# Patient Record
Sex: Female | Born: 1955 | Race: White | Hispanic: No | Smoking: Never smoker
Health system: Southern US, Community
[De-identification: ages and names within clinical notes are randomized; demographics above are authoritative.]

## PROBLEM LIST (undated history)

## (undated) DIAGNOSIS — S43439A Superior glenoid labrum lesion of unspecified shoulder, initial encounter: Secondary | ICD-10-CM

## (undated) DIAGNOSIS — E7800 Pure hypercholesterolemia, unspecified: Secondary | ICD-10-CM

## (undated) DIAGNOSIS — I10 Essential (primary) hypertension: Secondary | ICD-10-CM

## (undated) DIAGNOSIS — G4730 Sleep apnea, unspecified: Secondary | ICD-10-CM

## (undated) DIAGNOSIS — M5020 Other cervical disc displacement, unspecified cervical region: Secondary | ICD-10-CM

## (undated) DIAGNOSIS — G8929 Other chronic pain: Secondary | ICD-10-CM

## (undated) DIAGNOSIS — Z8719 Personal history of other diseases of the digestive system: Secondary | ICD-10-CM

## (undated) DIAGNOSIS — K219 Gastro-esophageal reflux disease without esophagitis: Secondary | ICD-10-CM

## (undated) DIAGNOSIS — J189 Pneumonia, unspecified organism: Secondary | ICD-10-CM

## (undated) DIAGNOSIS — Z87440 Personal history of urinary (tract) infections: Secondary | ICD-10-CM

## (undated) DIAGNOSIS — K449 Diaphragmatic hernia without obstruction or gangrene: Secondary | ICD-10-CM

## (undated) DIAGNOSIS — M1990 Unspecified osteoarthritis, unspecified site: Secondary | ICD-10-CM

## (undated) DIAGNOSIS — I2699 Other pulmonary embolism without acute cor pulmonale: Secondary | ICD-10-CM

## (undated) DIAGNOSIS — G709 Myoneural disorder, unspecified: Secondary | ICD-10-CM

## (undated) DIAGNOSIS — F32A Depression, unspecified: Secondary | ICD-10-CM

## (undated) DIAGNOSIS — H269 Unspecified cataract: Secondary | ICD-10-CM

## (undated) DIAGNOSIS — J449 Chronic obstructive pulmonary disease, unspecified: Secondary | ICD-10-CM

## (undated) DIAGNOSIS — T7840XA Allergy, unspecified, initial encounter: Secondary | ICD-10-CM

## (undated) DIAGNOSIS — K5792 Diverticulitis of intestine, part unspecified, without perforation or abscess without bleeding: Secondary | ICD-10-CM

## (undated) DIAGNOSIS — I219 Acute myocardial infarction, unspecified: Secondary | ICD-10-CM

## (undated) DIAGNOSIS — D689 Coagulation defect, unspecified: Secondary | ICD-10-CM

## (undated) DIAGNOSIS — R011 Cardiac murmur, unspecified: Secondary | ICD-10-CM

## (undated) DIAGNOSIS — D649 Anemia, unspecified: Secondary | ICD-10-CM

## (undated) DIAGNOSIS — K635 Polyp of colon: Secondary | ICD-10-CM

## (undated) DIAGNOSIS — F329 Major depressive disorder, single episode, unspecified: Secondary | ICD-10-CM

## (undated) HISTORY — PX: BACK SURGERY: SHX140

## (undated) HISTORY — DX: Coagulation defect, unspecified: D68.9

## (undated) HISTORY — PX: TUBAL LIGATION: SHX77

## (undated) HISTORY — DX: Personal history of other diseases of the digestive system: Z87.19

## (undated) HISTORY — PX: JOINT REPLACEMENT: SHX530

## (undated) HISTORY — PX: CARPAL TUNNEL RELEASE: SHX101

## (undated) HISTORY — DX: Unspecified cataract: H26.9

## (undated) HISTORY — DX: Allergy, unspecified, initial encounter: T78.40XA

## (undated) HISTORY — DX: Unspecified osteoarthritis, unspecified site: M19.90

## (undated) HISTORY — PX: ABDOMINAL HYSTERECTOMY: SHX81

## (undated) HISTORY — DX: Anemia, unspecified: D64.9

## (undated) HISTORY — DX: Major depressive disorder, single episode, unspecified: F32.9

## (undated) HISTORY — DX: Chronic obstructive pulmonary disease, unspecified: J44.9

## (undated) HISTORY — DX: Myoneural disorder, unspecified: G70.9

## (undated) HISTORY — PX: OTHER SURGICAL HISTORY: SHX169

## (undated) HISTORY — DX: Other chronic pain: G89.29

## (undated) HISTORY — PX: CHOLECYSTECTOMY: SHX55

## (undated) HISTORY — DX: Depression, unspecified: F32.A

## (undated) HISTORY — PX: REPLACEMENT TOTAL KNEE: SUR1224

## (undated) HISTORY — PX: BREAST BIOPSY: SHX20

## (undated) HISTORY — DX: Polyp of colon: K63.5

## (undated) HISTORY — DX: Superior glenoid labrum lesion of unspecified shoulder, initial encounter: S43.439A

---

## 1998-01-26 ENCOUNTER — Encounter: Admission: RE | Admit: 1998-01-26 | Discharge: 1998-01-26 | Payer: Self-pay | Admitting: Family Medicine

## 1998-02-19 ENCOUNTER — Encounter: Admission: RE | Admit: 1998-02-19 | Discharge: 1998-02-19 | Payer: Self-pay | Admitting: Sports Medicine

## 1998-07-15 ENCOUNTER — Encounter: Admission: RE | Admit: 1998-07-15 | Discharge: 1998-07-15 | Payer: Self-pay | Admitting: Family Medicine

## 1998-07-21 ENCOUNTER — Encounter: Admission: RE | Admit: 1998-07-21 | Discharge: 1998-07-21 | Payer: Self-pay | Admitting: Family Medicine

## 1998-08-06 ENCOUNTER — Encounter: Admission: RE | Admit: 1998-08-06 | Discharge: 1998-08-06 | Payer: Self-pay | Admitting: Family Medicine

## 1998-08-21 ENCOUNTER — Encounter: Admission: RE | Admit: 1998-08-21 | Discharge: 1998-08-21 | Payer: Self-pay | Admitting: Family Medicine

## 1998-09-22 ENCOUNTER — Encounter: Admission: RE | Admit: 1998-09-22 | Discharge: 1998-09-22 | Payer: Self-pay | Admitting: Family Medicine

## 1999-02-09 ENCOUNTER — Encounter: Admission: RE | Admit: 1999-02-09 | Discharge: 1999-02-09 | Payer: Self-pay | Admitting: Sports Medicine

## 1999-02-09 ENCOUNTER — Ambulatory Visit (HOSPITAL_COMMUNITY): Admission: RE | Admit: 1999-02-09 | Discharge: 1999-02-09 | Payer: Self-pay | Admitting: Family Medicine

## 1999-02-09 ENCOUNTER — Ambulatory Visit (HOSPITAL_COMMUNITY): Admission: RE | Admit: 1999-02-09 | Discharge: 1999-02-09 | Payer: Self-pay | Admitting: Sports Medicine

## 1999-02-24 ENCOUNTER — Ambulatory Visit (HOSPITAL_COMMUNITY): Admission: RE | Admit: 1999-02-24 | Discharge: 1999-02-24 | Payer: Self-pay | Admitting: *Deleted

## 1999-03-01 ENCOUNTER — Encounter: Admission: RE | Admit: 1999-03-01 | Discharge: 1999-03-01 | Payer: Self-pay | Admitting: Family Medicine

## 1999-07-27 ENCOUNTER — Encounter: Admission: RE | Admit: 1999-07-27 | Discharge: 1999-07-27 | Payer: Self-pay | Admitting: Sports Medicine

## 1999-09-14 ENCOUNTER — Other Ambulatory Visit: Admission: RE | Admit: 1999-09-14 | Discharge: 1999-09-14 | Payer: Self-pay | Admitting: *Deleted

## 1999-10-22 ENCOUNTER — Encounter: Admission: RE | Admit: 1999-10-22 | Discharge: 1999-10-22 | Payer: Self-pay | Admitting: Family Medicine

## 2000-03-29 ENCOUNTER — Encounter: Admission: RE | Admit: 2000-03-29 | Discharge: 2000-03-29 | Payer: Self-pay | Admitting: *Deleted

## 2000-03-29 ENCOUNTER — Encounter: Payer: Self-pay | Admitting: *Deleted

## 2000-03-29 ENCOUNTER — Encounter: Admission: RE | Admit: 2000-03-29 | Discharge: 2000-03-29 | Payer: Self-pay | Admitting: Family Medicine

## 2000-08-11 ENCOUNTER — Ambulatory Visit (HOSPITAL_COMMUNITY): Admission: RE | Admit: 2000-08-11 | Discharge: 2000-08-11 | Payer: Self-pay | Admitting: *Deleted

## 2000-08-11 ENCOUNTER — Encounter: Admission: RE | Admit: 2000-08-11 | Discharge: 2000-08-11 | Payer: Self-pay | Admitting: Family Medicine

## 2000-09-08 ENCOUNTER — Encounter: Admission: RE | Admit: 2000-09-08 | Discharge: 2000-09-08 | Payer: Self-pay | Admitting: Family Medicine

## 2000-09-08 ENCOUNTER — Ambulatory Visit (HOSPITAL_COMMUNITY): Admission: RE | Admit: 2000-09-08 | Discharge: 2000-09-08 | Payer: Self-pay | Admitting: *Deleted

## 2000-09-23 ENCOUNTER — Encounter (INDEPENDENT_AMBULATORY_CARE_PROVIDER_SITE_OTHER): Payer: Self-pay | Admitting: *Deleted

## 2000-10-19 ENCOUNTER — Encounter: Admission: RE | Admit: 2000-10-19 | Discharge: 2000-10-19 | Payer: Self-pay | Admitting: *Deleted

## 2000-10-30 ENCOUNTER — Encounter: Admission: RE | Admit: 2000-10-30 | Discharge: 2000-10-30 | Payer: Self-pay | Admitting: *Deleted

## 2001-02-12 ENCOUNTER — Encounter: Admission: RE | Admit: 2001-02-12 | Discharge: 2001-02-12 | Payer: Self-pay | Admitting: Family Medicine

## 2001-05-17 ENCOUNTER — Encounter: Admission: RE | Admit: 2001-05-17 | Discharge: 2001-05-17 | Payer: Self-pay | Admitting: Family Medicine

## 2001-07-09 ENCOUNTER — Ambulatory Visit (HOSPITAL_COMMUNITY): Admission: RE | Admit: 2001-07-09 | Discharge: 2001-07-09 | Payer: Self-pay | Admitting: Gastroenterology

## 2001-07-09 ENCOUNTER — Encounter (INDEPENDENT_AMBULATORY_CARE_PROVIDER_SITE_OTHER): Payer: Self-pay | Admitting: *Deleted

## 2001-07-16 ENCOUNTER — Encounter: Admission: RE | Admit: 2001-07-16 | Discharge: 2001-07-16 | Payer: Self-pay | Admitting: Family Medicine

## 2001-09-07 ENCOUNTER — Encounter: Admission: RE | Admit: 2001-09-07 | Discharge: 2001-09-07 | Payer: Self-pay | Admitting: Sports Medicine

## 2001-09-07 ENCOUNTER — Encounter: Payer: Self-pay | Admitting: Sports Medicine

## 2001-09-07 ENCOUNTER — Encounter: Admission: RE | Admit: 2001-09-07 | Discharge: 2001-09-07 | Payer: Self-pay | Admitting: Family Medicine

## 2001-10-03 ENCOUNTER — Encounter: Admission: RE | Admit: 2001-10-03 | Discharge: 2001-10-03 | Payer: Self-pay | Admitting: Family Medicine

## 2001-10-08 ENCOUNTER — Encounter: Admission: RE | Admit: 2001-10-08 | Discharge: 2001-10-08 | Payer: Self-pay | Admitting: Family Medicine

## 2001-10-30 ENCOUNTER — Encounter: Admission: RE | Admit: 2001-10-30 | Discharge: 2001-10-30 | Payer: Self-pay | Admitting: Family Medicine

## 2002-03-05 ENCOUNTER — Encounter: Admission: RE | Admit: 2002-03-05 | Discharge: 2002-03-05 | Payer: Self-pay | Admitting: Sports Medicine

## 2002-04-09 ENCOUNTER — Inpatient Hospital Stay (HOSPITAL_COMMUNITY): Admission: EM | Admit: 2002-04-09 | Discharge: 2002-04-10 | Payer: Self-pay

## 2002-09-25 ENCOUNTER — Encounter: Admission: RE | Admit: 2002-09-25 | Discharge: 2002-09-25 | Payer: Self-pay | Admitting: Family Medicine

## 2002-11-12 ENCOUNTER — Ambulatory Visit (HOSPITAL_COMMUNITY): Admission: RE | Admit: 2002-11-12 | Discharge: 2002-11-12 | Payer: Self-pay | Admitting: Orthopedic Surgery

## 2002-11-21 ENCOUNTER — Ambulatory Visit (HOSPITAL_COMMUNITY): Admission: RE | Admit: 2002-11-21 | Discharge: 2002-11-21 | Payer: Self-pay | Admitting: Endocrinology

## 2002-11-21 ENCOUNTER — Encounter: Payer: Self-pay | Admitting: Endocrinology

## 2002-11-22 ENCOUNTER — Encounter: Admission: RE | Admit: 2002-11-22 | Discharge: 2003-02-20 | Payer: Self-pay

## 2004-12-17 ENCOUNTER — Emergency Department (HOSPITAL_COMMUNITY): Admission: EM | Admit: 2004-12-17 | Discharge: 2004-12-17 | Payer: Self-pay | Admitting: Emergency Medicine

## 2005-04-01 ENCOUNTER — Ambulatory Visit: Payer: Self-pay | Admitting: Internal Medicine

## 2005-04-07 ENCOUNTER — Ambulatory Visit: Payer: Self-pay

## 2005-04-08 ENCOUNTER — Ambulatory Visit: Payer: Self-pay

## 2005-04-14 ENCOUNTER — Ambulatory Visit (HOSPITAL_COMMUNITY): Admission: RE | Admit: 2005-04-14 | Discharge: 2005-04-14 | Payer: Self-pay | Admitting: Gastroenterology

## 2005-04-14 ENCOUNTER — Encounter (INDEPENDENT_AMBULATORY_CARE_PROVIDER_SITE_OTHER): Payer: Self-pay | Admitting: Specialist

## 2005-06-03 ENCOUNTER — Encounter: Admission: RE | Admit: 2005-06-03 | Discharge: 2005-06-03 | Payer: Self-pay | Admitting: Gastroenterology

## 2005-06-06 ENCOUNTER — Encounter: Admission: RE | Admit: 2005-06-06 | Discharge: 2005-06-06 | Payer: Self-pay | Admitting: Gastroenterology

## 2005-06-08 ENCOUNTER — Encounter: Admission: RE | Admit: 2005-06-08 | Discharge: 2005-06-08 | Payer: Self-pay | Admitting: Gastroenterology

## 2005-06-23 ENCOUNTER — Ambulatory Visit (HOSPITAL_COMMUNITY): Admission: RE | Admit: 2005-06-23 | Discharge: 2005-06-23 | Payer: Self-pay | Admitting: Urology

## 2005-06-28 ENCOUNTER — Ambulatory Visit (HOSPITAL_COMMUNITY): Admission: RE | Admit: 2005-06-28 | Discharge: 2005-06-29 | Payer: Self-pay | Admitting: Urology

## 2005-08-31 ENCOUNTER — Encounter: Admission: RE | Admit: 2005-08-31 | Discharge: 2005-08-31 | Payer: Self-pay | Admitting: Family Medicine

## 2005-09-19 ENCOUNTER — Encounter: Admission: RE | Admit: 2005-09-19 | Discharge: 2005-09-19 | Payer: Self-pay | Admitting: Family Medicine

## 2005-09-19 ENCOUNTER — Encounter (INDEPENDENT_AMBULATORY_CARE_PROVIDER_SITE_OTHER): Payer: Self-pay | Admitting: *Deleted

## 2005-10-10 ENCOUNTER — Ambulatory Visit (HOSPITAL_COMMUNITY): Admission: RE | Admit: 2005-10-10 | Discharge: 2005-10-10 | Payer: Self-pay | Admitting: Cardiology

## 2006-09-06 ENCOUNTER — Encounter: Admission: RE | Admit: 2006-09-06 | Discharge: 2006-09-06 | Payer: Self-pay | Admitting: Family Medicine

## 2006-09-15 ENCOUNTER — Encounter: Admission: RE | Admit: 2006-09-15 | Discharge: 2006-09-15 | Payer: Self-pay | Admitting: Family Medicine

## 2006-12-22 ENCOUNTER — Encounter (INDEPENDENT_AMBULATORY_CARE_PROVIDER_SITE_OTHER): Payer: Self-pay | Admitting: *Deleted

## 2007-06-15 ENCOUNTER — Emergency Department (HOSPITAL_COMMUNITY): Admission: EM | Admit: 2007-06-15 | Discharge: 2007-06-16 | Payer: Self-pay | Admitting: Emergency Medicine

## 2007-11-02 ENCOUNTER — Encounter: Admission: RE | Admit: 2007-11-02 | Discharge: 2007-11-02 | Payer: Self-pay | Admitting: Family Medicine

## 2008-07-11 ENCOUNTER — Emergency Department (HOSPITAL_COMMUNITY): Admission: EM | Admit: 2008-07-11 | Discharge: 2008-07-12 | Payer: Self-pay | Admitting: Emergency Medicine

## 2008-09-01 ENCOUNTER — Encounter (INDEPENDENT_AMBULATORY_CARE_PROVIDER_SITE_OTHER): Payer: Self-pay | Admitting: Obstetrics and Gynecology

## 2008-09-01 ENCOUNTER — Ambulatory Visit (HOSPITAL_COMMUNITY): Admission: RE | Admit: 2008-09-01 | Discharge: 2008-09-02 | Payer: Self-pay | Admitting: Obstetrics and Gynecology

## 2008-09-17 ENCOUNTER — Emergency Department (HOSPITAL_COMMUNITY): Admission: EM | Admit: 2008-09-17 | Discharge: 2008-09-17 | Payer: Self-pay | Admitting: Emergency Medicine

## 2008-12-05 ENCOUNTER — Encounter: Admission: RE | Admit: 2008-12-05 | Discharge: 2008-12-05 | Payer: Self-pay | Admitting: Sports Medicine

## 2008-12-29 ENCOUNTER — Ambulatory Visit (HOSPITAL_COMMUNITY): Admission: RE | Admit: 2008-12-29 | Discharge: 2008-12-29 | Payer: Self-pay | Admitting: Family Medicine

## 2009-01-02 ENCOUNTER — Inpatient Hospital Stay (HOSPITAL_COMMUNITY): Admission: EM | Admit: 2009-01-02 | Discharge: 2009-01-05 | Payer: Self-pay | Admitting: Emergency Medicine

## 2009-01-05 ENCOUNTER — Encounter (INDEPENDENT_AMBULATORY_CARE_PROVIDER_SITE_OTHER): Payer: Self-pay | Admitting: Internal Medicine

## 2009-01-07 ENCOUNTER — Emergency Department (HOSPITAL_COMMUNITY): Admission: EM | Admit: 2009-01-07 | Discharge: 2009-01-07 | Payer: Self-pay | Admitting: Emergency Medicine

## 2009-04-06 ENCOUNTER — Emergency Department (HOSPITAL_COMMUNITY): Admission: EM | Admit: 2009-04-06 | Discharge: 2009-04-07 | Payer: Self-pay | Admitting: Emergency Medicine

## 2009-05-08 ENCOUNTER — Encounter
Admission: RE | Admit: 2009-05-08 | Discharge: 2009-08-06 | Payer: Self-pay | Admitting: Physical Medicine & Rehabilitation

## 2009-05-12 ENCOUNTER — Ambulatory Visit: Payer: Self-pay | Admitting: Physical Medicine & Rehabilitation

## 2009-05-21 ENCOUNTER — Ambulatory Visit (HOSPITAL_COMMUNITY)
Admission: RE | Admit: 2009-05-21 | Discharge: 2009-05-21 | Payer: Self-pay | Admitting: Physical Medicine & Rehabilitation

## 2009-06-09 ENCOUNTER — Ambulatory Visit: Payer: Self-pay | Admitting: Physical Medicine & Rehabilitation

## 2009-06-25 ENCOUNTER — Encounter
Admission: RE | Admit: 2009-06-25 | Discharge: 2009-08-24 | Payer: Self-pay | Admitting: Physical Medicine & Rehabilitation

## 2009-07-09 ENCOUNTER — Ambulatory Visit: Payer: Self-pay | Admitting: Physical Medicine & Rehabilitation

## 2009-08-05 ENCOUNTER — Encounter
Admission: RE | Admit: 2009-08-05 | Discharge: 2009-10-14 | Payer: Self-pay | Admitting: Physical Medicine & Rehabilitation

## 2009-08-07 ENCOUNTER — Ambulatory Visit: Payer: Self-pay | Admitting: Physical Medicine & Rehabilitation

## 2009-08-14 ENCOUNTER — Ambulatory Visit: Payer: Self-pay | Admitting: Physical Medicine & Rehabilitation

## 2009-08-21 ENCOUNTER — Ambulatory Visit: Payer: Self-pay | Admitting: Physical Medicine & Rehabilitation

## 2009-10-01 ENCOUNTER — Encounter: Admission: RE | Admit: 2009-10-01 | Discharge: 2009-10-01 | Payer: Self-pay | Admitting: Internal Medicine

## 2009-10-28 ENCOUNTER — Encounter
Admission: RE | Admit: 2009-10-28 | Discharge: 2010-01-26 | Payer: Self-pay | Admitting: Physical Medicine & Rehabilitation

## 2009-10-29 ENCOUNTER — Ambulatory Visit: Payer: Self-pay | Admitting: Physical Medicine & Rehabilitation

## 2009-11-05 ENCOUNTER — Inpatient Hospital Stay (HOSPITAL_COMMUNITY): Admission: EM | Admit: 2009-11-05 | Discharge: 2009-11-08 | Payer: Self-pay | Admitting: Emergency Medicine

## 2010-12-10 ENCOUNTER — Other Ambulatory Visit: Payer: Self-pay | Admitting: Internal Medicine

## 2010-12-10 ENCOUNTER — Ambulatory Visit
Admission: RE | Admit: 2010-12-10 | Discharge: 2010-12-10 | Disposition: A | Discharge status: Home / Self Care | Payer: Medicare Other | Source: Ambulatory Visit | Attending: Internal Medicine | Admitting: Internal Medicine

## 2010-12-10 DIAGNOSIS — R1032 Left lower quadrant pain: Secondary | ICD-10-CM

## 2010-12-10 MED ORDER — IOHEXOL 300 MG/ML  SOLN
100.0000 mL | Freq: Once | INTRAMUSCULAR | Status: AC | PRN
  Administered 2010-12-10: 125 mL via INTRAVENOUS

## 2010-12-23 ENCOUNTER — Inpatient Hospital Stay (HOSPITAL_COMMUNITY)
Admission: EM | Admit: 2010-12-23 | Discharge: 2011-01-01 | DRG: 392 | Disposition: A | Discharge status: Home / Self Care | Payer: Medicare Other | Attending: Internal Medicine | Admitting: Internal Medicine

## 2010-12-23 ENCOUNTER — Emergency Department (HOSPITAL_COMMUNITY): Payer: Medicare Other

## 2010-12-23 DIAGNOSIS — K5732 Diverticulitis of large intestine without perforation or abscess without bleeding: Principal | ICD-10-CM | POA: Diagnosis present

## 2010-12-23 DIAGNOSIS — K7689 Other specified diseases of liver: Secondary | ICD-10-CM | POA: Diagnosis present

## 2010-12-23 DIAGNOSIS — K5909 Other constipation: Secondary | ICD-10-CM | POA: Diagnosis present

## 2010-12-23 DIAGNOSIS — J45909 Unspecified asthma, uncomplicated: Secondary | ICD-10-CM | POA: Diagnosis present

## 2010-12-23 DIAGNOSIS — K219 Gastro-esophageal reflux disease without esophagitis: Secondary | ICD-10-CM | POA: Diagnosis present

## 2010-12-23 DIAGNOSIS — E669 Obesity, unspecified: Secondary | ICD-10-CM | POA: Diagnosis present

## 2010-12-23 DIAGNOSIS — B952 Enterococcus as the cause of diseases classified elsewhere: Secondary | ICD-10-CM | POA: Diagnosis present

## 2010-12-23 DIAGNOSIS — Z860100 Personal history of colon polyps, unspecified: Secondary | ICD-10-CM

## 2010-12-23 DIAGNOSIS — N390 Urinary tract infection, site not specified: Secondary | ICD-10-CM | POA: Diagnosis present

## 2010-12-23 DIAGNOSIS — R109 Unspecified abdominal pain: Secondary | ICD-10-CM | POA: Diagnosis present

## 2010-12-23 DIAGNOSIS — Z86718 Personal history of other venous thrombosis and embolism: Secondary | ICD-10-CM

## 2010-12-23 DIAGNOSIS — Z7982 Long term (current) use of aspirin: Secondary | ICD-10-CM

## 2010-12-23 DIAGNOSIS — G894 Chronic pain syndrome: Secondary | ICD-10-CM | POA: Diagnosis present

## 2010-12-23 DIAGNOSIS — F1120 Opioid dependence, uncomplicated: Secondary | ICD-10-CM | POA: Diagnosis present

## 2010-12-23 DIAGNOSIS — Z86711 Personal history of pulmonary embolism: Secondary | ICD-10-CM

## 2010-12-23 DIAGNOSIS — E785 Hyperlipidemia, unspecified: Secondary | ICD-10-CM | POA: Diagnosis present

## 2010-12-23 DIAGNOSIS — Z7901 Long term (current) use of anticoagulants: Secondary | ICD-10-CM

## 2010-12-23 LAB — URINE MICROSCOPIC-ADD ON

## 2010-12-23 LAB — COMPREHENSIVE METABOLIC PANEL
Albumin: 3.6 g/dL (ref 3.5–5.2)
BUN: 8 mg/dL (ref 6–23)
Calcium: 9.2 mg/dL (ref 8.4–10.5)
Creatinine, Ser: 0.67 mg/dL (ref 0.4–1.2)
Total Bilirubin: 1.1 mg/dL (ref 0.3–1.2)
Total Protein: 6.6 g/dL (ref 6.0–8.3)

## 2010-12-23 LAB — PROTIME-INR
INR: 1 (ref 0.00–1.49)
Prothrombin Time: 13.4 seconds (ref 11.6–15.2)

## 2010-12-23 LAB — CBC
MCHC: 31.8 g/dL (ref 30.0–36.0)
Platelets: 269 10*3/uL (ref 150–400)
RDW: 17.6 % — ABNORMAL HIGH (ref 11.5–15.5)

## 2010-12-23 LAB — URINALYSIS, ROUTINE W REFLEX MICROSCOPIC
Protein, ur: NEGATIVE mg/dL
Urine Glucose, Fasting: NEGATIVE mg/dL
pH: 6 (ref 5.0–8.0)

## 2010-12-23 LAB — DIFFERENTIAL
Monocytes Absolute: 0.6 10*3/uL (ref 0.1–1.0)
Neutro Abs: 5.5 10*3/uL (ref 1.7–7.7)

## 2010-12-23 MED ORDER — IOHEXOL 300 MG/ML  SOLN
100.0000 mL | Freq: Once | INTRAMUSCULAR | Status: AC | PRN
  Administered 2010-12-23: 100 mL via INTRAVENOUS

## 2010-12-25 ENCOUNTER — Inpatient Hospital Stay (HOSPITAL_COMMUNITY): Payer: Medicare Other

## 2010-12-25 LAB — URINALYSIS, ROUTINE W REFLEX MICROSCOPIC
Ketones, ur: NEGATIVE mg/dL
Nitrite: NEGATIVE
Protein, ur: NEGATIVE mg/dL

## 2010-12-25 LAB — COMPREHENSIVE METABOLIC PANEL
ALT: 12 U/L (ref 0–35)
Alkaline Phosphatase: 51 U/L (ref 39–117)
CO2: 29 mEq/L (ref 19–32)
GFR calc non Af Amer: >60 mL/min (ref >60)
Glucose, Bld: 106 mg/dL — ABNORMAL HIGH (ref 70–99)
Potassium: 3.6 mEq/L (ref 3.5–5.1)
Sodium: 146 mEq/L — ABNORMAL HIGH (ref 135–145)
Total Bilirubin: 0.5 mg/dL (ref 0.3–1.2)

## 2010-12-25 LAB — CBC
HCT: 37.5 % (ref 36.0–46.0)
Hemoglobin: 11.2 g/dL — ABNORMAL LOW (ref 12.0–15.0)
MCHC: 29.9 g/dL — ABNORMAL LOW (ref 30.0–36.0)

## 2010-12-25 LAB — PROTIME-INR: INR: 1.54 — ABNORMAL HIGH (ref 0.00–1.49)

## 2010-12-26 LAB — URINE CULTURE: Culture: NO GROWTH

## 2010-12-27 LAB — URINALYSIS, ROUTINE W REFLEX MICROSCOPIC
Bilirubin Urine: NEGATIVE
Nitrite: NEGATIVE
Protein, ur: NEGATIVE mg/dL
Specific Gravity, Urine: 1.012 (ref 1.005–1.030)
Urobilinogen, UA: 0.2 mg/dL (ref 0.0–1.0)

## 2010-12-27 LAB — BASIC METABOLIC PANEL
BUN: 2 mg/dL — ABNORMAL LOW (ref 6–23)
Calcium: 8.8 mg/dL (ref 8.4–10.5)
GFR calc non Af Amer: >60 mL/min (ref >60)
Potassium: 3 mEq/L — ABNORMAL LOW (ref 3.5–5.1)
Sodium: 143 mEq/L (ref 135–145)

## 2010-12-27 LAB — CBC
Hemoglobin: 11.8 g/dL — ABNORMAL LOW (ref 12.0–15.0)
MCHC: 30.6 g/dL (ref 30.0–36.0)
Platelets: 222 10*3/uL (ref 150–400)
RBC: 4.63 MIL/uL (ref 3.87–5.11)

## 2010-12-27 LAB — PROTIME-INR
INR: 3.55 — ABNORMAL HIGH (ref 0.00–1.49)
Prothrombin Time: 35.5 seconds — ABNORMAL HIGH (ref 11.6–15.2)

## 2010-12-27 LAB — MAGNESIUM: Magnesium: 2.1 mg/dL (ref 1.5–2.5)

## 2010-12-28 LAB — CBC
MCH: 24.9 pg — ABNORMAL LOW (ref 26.0–34.0)
MCHC: 30.4 g/dL (ref 30.0–36.0)
MCV: 82 fL (ref 78.0–100.0)
Platelets: 194 10*3/uL (ref 150–400)
RBC: 4.38 MIL/uL (ref 3.87–5.11)
RDW: 18.1 % — ABNORMAL HIGH (ref 11.5–15.5)

## 2010-12-28 LAB — PROTIME-INR: Prothrombin Time: 36.6 seconds — ABNORMAL HIGH (ref 11.6–15.2)

## 2010-12-28 LAB — COMPREHENSIVE METABOLIC PANEL
BUN: 2 mg/dL — ABNORMAL LOW (ref 6–23)
CO2: 25 mEq/L (ref 19–32)
Calcium: 8.4 mg/dL (ref 8.4–10.5)
Creatinine, Ser: 0.62 mg/dL (ref 0.4–1.2)
GFR calc non Af Amer: >60 mL/min (ref >60)
Glucose, Bld: 90 mg/dL (ref 70–99)
Total Protein: 5.5 g/dL — ABNORMAL LOW (ref 6.0–8.3)

## 2010-12-29 LAB — CBC
HCT: 37.1 % (ref 36.0–46.0)
MCH: 24.9 pg — ABNORMAL LOW (ref 26.0–34.0)
MCV: 81 fL (ref 78.0–100.0)
Platelets: 214 10*3/uL (ref 150–400)
RDW: 17.8 % — ABNORMAL HIGH (ref 11.5–15.5)
WBC: 8.2 10*3/uL (ref 4.0–10.5)

## 2010-12-29 LAB — COMPREHENSIVE METABOLIC PANEL
AST: 36 U/L (ref 0–37)
Alkaline Phosphatase: 46 U/L (ref 39–117)
BUN: <1 mg/dL — ABNORMAL LOW (ref 6–23)
CO2: 28 mEq/L (ref 19–32)
Chloride: 108 mEq/L (ref 96–112)
Creatinine, Ser: 0.66 mg/dL (ref 0.4–1.2)
GFR calc Af Amer: >60 mL/min (ref >60)
GFR calc non Af Amer: >60 mL/min (ref >60)
Potassium: 3.5 mEq/L (ref 3.5–5.1)
Total Bilirubin: 0.5 mg/dL (ref 0.3–1.2)

## 2010-12-29 LAB — HEPATITIS PANEL, ACUTE: HCV Ab: NEGATIVE

## 2010-12-30 ENCOUNTER — Inpatient Hospital Stay (HOSPITAL_COMMUNITY): Payer: Medicare Other

## 2010-12-30 LAB — URINALYSIS, ROUTINE W REFLEX MICROSCOPIC
Glucose, UA: NEGATIVE mg/dL
Ketones, ur: NEGATIVE mg/dL
Nitrite: NEGATIVE
Specific Gravity, Urine: 1.014 (ref 1.005–1.030)
pH: 7 (ref 5.0–8.0)

## 2010-12-30 LAB — URINE CULTURE
Colony Count: 60000
Special Requests: NEGATIVE

## 2010-12-30 LAB — PROTIME-INR: INR: 2.16 — ABNORMAL HIGH (ref 0.00–1.49)

## 2010-12-31 ENCOUNTER — Encounter (HOSPITAL_COMMUNITY): Payer: Self-pay | Admitting: Radiology

## 2010-12-31 LAB — BASIC METABOLIC PANEL
Chloride: 106 mEq/L (ref 96–112)
GFR calc Af Amer: >60 mL/min (ref >60)
GFR calc non Af Amer: >60 mL/min (ref >60)
Potassium: 2.8 mEq/L — ABNORMAL LOW (ref 3.5–5.1)
Sodium: 137 mEq/L (ref 135–145)

## 2010-12-31 LAB — PROTIME-INR
INR: 2.75 — ABNORMAL HIGH (ref 0.00–1.49)
Prothrombin Time: 29.2 seconds — ABNORMAL HIGH (ref 11.6–15.2)

## 2010-12-31 MED ORDER — IOHEXOL 300 MG/ML  SOLN
100.0000 mL | Freq: Once | INTRAMUSCULAR | Status: AC | PRN
  Administered 2010-12-31: 100 mL via INTRAVENOUS
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
## 2011-01-01 LAB — URINE CULTURE
Culture: NO GROWTH
Special Requests: NEGATIVE

## 2011-01-01 LAB — BASIC METABOLIC PANEL
BUN: 2 mg/dL — ABNORMAL LOW (ref 6–23)
Calcium: 8.4 mg/dL (ref 8.4–10.5)
Chloride: 110 mEq/L (ref 96–112)
Creatinine, Ser: 0.57 mg/dL (ref 0.4–1.2)
GFR calc Af Amer: >60 mL/min (ref >60)

## 2011-01-01 LAB — PROTIME-INR
INR: 2.86 — ABNORMAL HIGH (ref 0.00–1.49)
Prothrombin Time: 30.1 seconds — ABNORMAL HIGH (ref 11.6–15.2)

## 2011-01-09 LAB — URINALYSIS, ROUTINE W REFLEX MICROSCOPIC
Bilirubin Urine: NEGATIVE
Hgb urine dipstick: NEGATIVE
Ketones, ur: NEGATIVE mg/dL
Nitrite: NEGATIVE
Nitrite: POSITIVE — AB
Protein, ur: NEGATIVE mg/dL
Specific Gravity, Urine: 1.013 (ref 1.005–1.030)
Urobilinogen, UA: 0.2 mg/dL (ref 0.0–1.0)
Urobilinogen, UA: 1 mg/dL (ref 0.0–1.0)
pH: 8.5 — ABNORMAL HIGH (ref 5.0–8.0)

## 2011-01-09 LAB — CARDIAC PANEL(CRET KIN+CKTOT+MB+TROPI)
CK, MB: 2.4 ng/mL (ref 0.3–4.0)
Total CK: 151 U/L (ref 7–177)
Total CK: 90 U/L (ref 7–177)
Troponin I: 0.02 ng/mL (ref 0.00–0.06)
Troponin I: 0.06 ng/mL (ref 0.00–0.06)

## 2011-01-09 LAB — PROTIME-INR
INR: 1.87 — ABNORMAL HIGH (ref 0.00–1.49)
Prothrombin Time: 24.6 seconds — ABNORMAL HIGH (ref 11.6–15.2)

## 2011-01-09 LAB — BASIC METABOLIC PANEL
BUN: 8 mg/dL (ref 6–23)
CO2: 31 mEq/L (ref 19–32)
Calcium: 8.6 mg/dL (ref 8.4–10.5)
Calcium: 9.6 mg/dL (ref 8.4–10.5)
Creatinine, Ser: 0.71 mg/dL (ref 0.4–1.2)
GFR calc Af Amer: >60 mL/min (ref >60)
GFR calc non Af Amer: 56 mL/min — ABNORMAL LOW (ref >60)
GFR calc non Af Amer: >60 mL/min (ref >60)
Glucose, Bld: 102 mg/dL — ABNORMAL HIGH (ref 70–99)
Glucose, Bld: 102 mg/dL — ABNORMAL HIGH (ref 70–99)
Sodium: 136 mEq/L (ref 135–145)
Sodium: 137 mEq/L (ref 135–145)

## 2011-01-09 LAB — DIFFERENTIAL
Basophils Relative: 0 % (ref 0–1)
Eosinophils Absolute: 0 10*3/uL (ref 0.0–0.7)
Lymphs Abs: 1.6 10*3/uL (ref 0.7–4.0)
Monocytes Absolute: 1.3 10*3/uL — ABNORMAL HIGH (ref 0.1–1.0)
Monocytes Relative: 9 % (ref 3–12)
Neutrophils Relative %: 80 % — ABNORMAL HIGH (ref 43–77)

## 2011-01-09 LAB — CBC
Hemoglobin: 11.6 g/dL — ABNORMAL LOW (ref 12.0–15.0)
MCHC: 33.6 g/dL (ref 30.0–36.0)
MCHC: 33.7 g/dL (ref 30.0–36.0)
Platelets: 248 10*3/uL (ref 150–400)
Platelets: 272 10*3/uL (ref 150–400)
RBC: 4.19 MIL/uL (ref 3.87–5.11)
RDW: 16.1 % — ABNORMAL HIGH (ref 11.5–15.5)
RDW: 16.4 % — ABNORMAL HIGH (ref 11.5–15.5)
RDW: 16.5 % — ABNORMAL HIGH (ref 11.5–15.5)
WBC: 16.4 10*3/uL — ABNORMAL HIGH (ref 4.0–10.5)

## 2011-01-09 LAB — CULTURE, BLOOD (ROUTINE X 2)
Culture: NO GROWTH
Culture: NO GROWTH

## 2011-01-09 LAB — URINE MICROSCOPIC-ADD ON

## 2011-01-09 LAB — URINE CULTURE: Colony Count: >=100000

## 2011-01-09 LAB — TSH: TSH: 3.467 u[IU]/mL (ref 0.350–4.500)

## 2011-01-09 LAB — COMPREHENSIVE METABOLIC PANEL
ALT: 15 U/L (ref 0–35)
AST: 20 U/L (ref 0–37)
Albumin: 3.2 g/dL — ABNORMAL LOW (ref 3.5–5.2)
Alkaline Phosphatase: 63 U/L (ref 39–117)
Calcium: 8.7 mg/dL (ref 8.4–10.5)
GFR calc Af Amer: >60 mL/min (ref >60)
Potassium: 3.2 mEq/L — ABNORMAL LOW (ref 3.5–5.1)
Sodium: 134 mEq/L — ABNORMAL LOW (ref 135–145)
Total Protein: 6.9 g/dL (ref 6.0–8.3)

## 2011-01-09 LAB — POCT CARDIAC MARKERS
CKMB, poc: <1 ng/mL — ABNORMAL LOW (ref 1.0–8.0)
Troponin i, poc: <0.05 ng/mL (ref 0.00–0.09)

## 2011-01-09 LAB — T4, FREE: Free T4: 1.2 ng/dL (ref 0.80–1.80)
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
## 2011-01-31 LAB — BASIC METABOLIC PANEL
CO2: 27 mEq/L (ref 19–32)
Chloride: 103 mEq/L (ref 96–112)
GFR calc Af Amer: >60 mL/min (ref >60)
Glucose, Bld: 98 mg/dL (ref 70–99)
Sodium: 139 mEq/L (ref 135–145)

## 2011-01-31 LAB — URINALYSIS, ROUTINE W REFLEX MICROSCOPIC
Bilirubin Urine: NEGATIVE
Hgb urine dipstick: NEGATIVE
Ketones, ur: 15 mg/dL — AB
Specific Gravity, Urine: 1.036 — ABNORMAL HIGH (ref 1.005–1.030)
Urobilinogen, UA: 0.2 mg/dL (ref 0.0–1.0)

## 2011-01-31 LAB — DIFFERENTIAL
Basophils Relative: 1 % (ref 0–1)
Eosinophils Absolute: 0.2 10*3/uL (ref 0.0–0.7)
Eosinophils Relative: 2 % (ref 0–5)
Monocytes Absolute: 0.7 10*3/uL (ref 0.1–1.0)
Monocytes Relative: 6 % (ref 3–12)
Neutro Abs: 8.3 10*3/uL — ABNORMAL HIGH (ref 1.7–7.7)

## 2011-01-31 LAB — CBC
HCT: 41.3 % (ref 36.0–46.0)
Hemoglobin: 13.6 g/dL (ref 12.0–15.0)
MCHC: 32.9 g/dL (ref 30.0–36.0)
MCV: 78.2 fL (ref 78.0–100.0)
RBC: 5.29 MIL/uL — ABNORMAL HIGH (ref 3.87–5.11)

## 2011-01-31 LAB — URINE MICROSCOPIC-ADD ON

## 2011-02-03 LAB — HOMOCYSTEINE: Homocysteine: 9.8 umol/L (ref 4.0–15.4)

## 2011-02-03 LAB — CBC
HCT: 37.1 % (ref 36.0–46.0)
HCT: 34.7 % — ABNORMAL LOW (ref 36.0–46.0)
HCT: 36.9 % (ref 36.0–46.0)
HCT: 38.6 % (ref 36.0–46.0)
Hemoglobin: 11.5 g/dL — ABNORMAL LOW (ref 12.0–15.0)
Hemoglobin: 12.6 g/dL (ref 12.0–15.0)
Hemoglobin: 12.6 g/dL (ref 12.0–15.0)
Hemoglobin: 12.6 g/dL (ref 12.0–15.0)
MCHC: 32.6 g/dL (ref 30.0–36.0)
MCHC: 32.8 g/dL (ref 30.0–36.0)
MCHC: 33.2 g/dL (ref 30.0–36.0)
MCHC: 34.1 g/dL (ref 30.0–36.0)
MCV: 79.7 fL (ref 78.0–100.0)
MCV: 79.7 fL (ref 78.0–100.0)
MCV: 80.6 fL (ref 78.0–100.0)
MCV: 81.1 fL (ref 78.0–100.0)
MCV: 81.1 fL (ref 78.0–100.0)
Platelets: 325 10*3/uL (ref 150–400)
Platelets: 340 10*3/uL (ref 150–400)
Platelets: 359 10*3/uL (ref 150–400)
RBC: 4.66 MIL/uL (ref 3.87–5.11)
RBC: 4.3 MIL/uL (ref 3.87–5.11)
RBC: 4.63 MIL/uL (ref 3.87–5.11)
RBC: 4.76 MIL/uL (ref 3.87–5.11)
RBC: 4.78 MIL/uL (ref 3.87–5.11)
RDW: 14.4 % (ref 11.5–15.5)
RDW: 15.2 % (ref 11.5–15.5)
RDW: 15.7 % — ABNORMAL HIGH (ref 11.5–15.5)
WBC: 10.4 10*3/uL (ref 4.0–10.5)
WBC: 10.5 10*3/uL (ref 4.0–10.5)
WBC: 8.3 10*3/uL (ref 4.0–10.5)
WBC: 9 10*3/uL (ref 4.0–10.5)

## 2011-02-03 LAB — DIFFERENTIAL
Basophils Absolute: 0 10*3/uL (ref 0.0–0.1)
Basophils Relative: 1 % (ref 0–1)
Eosinophils Absolute: 0.2 10*3/uL (ref 0.0–0.7)
Lymphocytes Relative: 20 % (ref 12–46)
Lymphs Abs: 2.4 10*3/uL (ref 0.7–4.0)
Neutro Abs: 7.4 10*3/uL (ref 1.7–7.7)
Neutrophils Relative %: 67 % (ref 43–77)

## 2011-02-03 LAB — LUPUS ANTICOAGULANT PANEL
DRVVT: 54.7 secs — ABNORMAL HIGH (ref 36.1–47.0)
Lupus Anticoagulant: DETECTED — AB
PTT Lupus Anticoagulant: 78.8 secs — ABNORMAL HIGH (ref 36.3–48.8)
PTTLA 4:1 Mix: 64.7 secs — ABNORMAL HIGH (ref 36.3–48.8)
PTTLA Confirmation: 15.1 secs — ABNORMAL HIGH (ref <8.0)
dRVVT Incubated 1:1 Mix: 39.7 secs (ref 36.1–47.0)

## 2011-02-03 LAB — COMPREHENSIVE METABOLIC PANEL
ALT: 14 U/L (ref 0–35)
BUN: 11 mg/dL (ref 6–23)
CO2: 26 mEq/L (ref 19–32)
CO2: 29 mEq/L (ref 19–32)
Calcium: 9.2 mg/dL (ref 8.4–10.5)
Chloride: 107 mEq/L (ref 96–112)
Creatinine, Ser: 0.89 mg/dL (ref 0.4–1.2)
GFR calc non Af Amer: >60 mL/min (ref >60)
GFR calc non Af Amer: >60 mL/min (ref >60)
Glucose, Bld: 90 mg/dL (ref 70–99)
Sodium: 138 mEq/L (ref 135–145)
Total Bilirubin: 0.5 mg/dL (ref 0.3–1.2)

## 2011-02-03 LAB — URINALYSIS, ROUTINE W REFLEX MICROSCOPIC
Hgb urine dipstick: NEGATIVE
Nitrite: NEGATIVE
Nitrite: NEGATIVE
Protein, ur: NEGATIVE mg/dL
Specific Gravity, Urine: >1.046 — ABNORMAL HIGH (ref 1.005–1.030)
Urobilinogen, UA: 1 mg/dL (ref 0.0–1.0)
Urobilinogen, UA: 1 mg/dL (ref 0.0–1.0)

## 2011-02-03 LAB — APTT
aPTT: 44 seconds — ABNORMAL HIGH (ref 24–37)
aPTT: 31 seconds (ref 24–37)

## 2011-02-03 LAB — FOLATE: Folate: 14 ng/mL

## 2011-02-03 LAB — POCT CARDIAC MARKERS
CKMB, poc: <1 ng/mL — ABNORMAL LOW (ref 1.0–8.0)
Myoglobin, poc: 71.2 ng/mL (ref 12–200)
Troponin i, poc: <0.05 ng/mL (ref 0.00–0.09)
Troponin i, poc: <0.05 ng/mL (ref 0.00–0.09)

## 2011-02-03 LAB — PROTIME-INR
INR: 1.9 — ABNORMAL HIGH (ref 0.00–1.49)
INR: 1 (ref 0.00–1.49)
INR: 1.1 (ref 0.00–1.49)
INR: 2 — ABNORMAL HIGH (ref 0.00–1.49)
Prothrombin Time: 13.7 seconds (ref 11.6–15.2)
Prothrombin Time: 14.4 seconds (ref 11.6–15.2)
Prothrombin Time: 23.9 seconds — ABNORMAL HIGH (ref 11.6–15.2)

## 2011-02-03 LAB — URINE MICROSCOPIC-ADD ON

## 2011-02-03 LAB — POCT I-STAT, CHEM 8
BUN: 13 mg/dL (ref 6–23)
Creatinine, Ser: 0.8 mg/dL (ref 0.4–1.2)
Potassium: 3.8 mEq/L (ref 3.5–5.1)
Sodium: 141 mEq/L (ref 135–145)

## 2011-02-03 LAB — PROTEIN S ACTIVITY: Protein S Activity: 102 % (ref 69–129)

## 2011-02-03 LAB — BASIC METABOLIC PANEL
BUN: 6 mg/dL (ref 6–23)
BUN: 7 mg/dL (ref 6–23)
CO2: 26 mEq/L (ref 19–32)
CO2: 29 mEq/L (ref 19–32)
Calcium: 8.8 mg/dL (ref 8.4–10.5)
Calcium: 9.1 mg/dL (ref 8.4–10.5)
Chloride: 101 mEq/L (ref 96–112)
Chloride: 101 mEq/L (ref 96–112)
Creatinine, Ser: 0.77 mg/dL (ref 0.4–1.2)
Creatinine, Ser: 0.81 mg/dL (ref 0.4–1.2)
GFR calc Af Amer: >60 mL/min (ref >60)
GFR calc Af Amer: >60 mL/min (ref >60)
GFR calc non Af Amer: >60 mL/min (ref >60)
GFR calc non Af Amer: >60 mL/min (ref >60)
Glucose, Bld: 112 mg/dL — ABNORMAL HIGH (ref 70–99)
Glucose, Bld: 138 mg/dL — ABNORMAL HIGH (ref 70–99)
Potassium: 3.6 mEq/L (ref 3.5–5.1)
Potassium: 3.7 mEq/L (ref 3.5–5.1)
Sodium: 136 mEq/L (ref 135–145)
Sodium: 138 mEq/L (ref 135–145)

## 2011-02-03 LAB — RETICULOCYTES
RBC.: 4.76 MIL/uL (ref 3.87–5.11)
Retic Count, Absolute: 42.8 10*3/uL (ref 19.0–186.0)
Retic Ct Pct: 0.9 % (ref 0.4–3.1)

## 2011-02-03 LAB — CARDIOLIPIN ANTIBODIES, IGG, IGM, IGA
Anticardiolipin IgA: 10 [APL'U] — ABNORMAL LOW (ref <13)
Anticardiolipin IgG: <7 [GPL'U] — ABNORMAL LOW (ref <11)
Anticardiolipin IgM: <7 [MPL'U] — ABNORMAL LOW (ref <10)

## 2011-02-03 LAB — HEPARIN LEVEL (UNFRACTIONATED): Heparin Unfractionated: 0.16 IU/mL — ABNORMAL LOW (ref 0.30–0.70)

## 2011-02-03 LAB — LIPASE, BLOOD
Lipase: 17 U/L (ref 11–59)
Lipase: 12 U/L (ref 11–59)

## 2011-02-03 LAB — PROTEIN S, TOTAL: Protein S Ag, Total: 159 % — ABNORMAL HIGH (ref 70–140)

## 2011-02-03 LAB — VITAMIN B12: Vitamin B-12: 325 pg/mL (ref 211–911)

## 2011-02-03 LAB — URINE CULTURE
Colony Count: 60000
Colony Count: 85000

## 2011-02-03 LAB — PROTEIN C, TOTAL: Protein C, Total: 142 % — ABNORMAL HIGH (ref 70–140)

## 2011-02-03 LAB — IRON AND TIBC
Iron: 48 ug/dL (ref 42–135)
Saturation Ratios: 14 % — ABNORMAL LOW (ref 20–55)
TIBC: 332 ug/dL (ref 250–470)
UIBC: 284 ug/dL

## 2011-02-03 LAB — D-DIMER, QUANTITATIVE: D-Dimer, Quant: 1.83 ug/mL-FEU — ABNORMAL HIGH (ref 0.00–0.48)

## 2011-02-03 LAB — ANTITHROMBIN III: AntiThromb III Func: 124 % — ABNORMAL HIGH (ref 76–126)

## 2011-02-03 LAB — FERRITIN: Ferritin: 67 ng/mL (ref 10–291)

## 2011-02-03 LAB — PROTEIN C ACTIVITY: Protein C Activity: 200 % — ABNORMAL HIGH (ref 75–133)

## 2011-03-01 ENCOUNTER — Ambulatory Visit
Admission: RE | Admit: 2011-03-01 | Discharge: 2011-03-01 | Disposition: A | Discharge status: Home / Self Care | Payer: Medicare Other | Source: Ambulatory Visit | Attending: Internal Medicine | Admitting: Internal Medicine

## 2011-03-01 ENCOUNTER — Other Ambulatory Visit: Payer: Self-pay | Admitting: Internal Medicine

## 2011-03-01 DIAGNOSIS — M79604 Pain in right leg: Secondary | ICD-10-CM

## 2011-03-01 DIAGNOSIS — M79605 Pain in left leg: Secondary | ICD-10-CM

## 2011-03-02 ENCOUNTER — Other Ambulatory Visit: Payer: Self-pay | Admitting: Pain Medicine

## 2011-03-02 ENCOUNTER — Ambulatory Visit
Admission: RE | Admit: 2011-03-02 | Discharge: 2011-03-02 | Disposition: A | Discharge status: Home / Self Care | Payer: Medicare Other | Source: Ambulatory Visit | Attending: Pain Medicine | Admitting: Pain Medicine

## 2011-03-02 DIAGNOSIS — M25561 Pain in right knee: Secondary | ICD-10-CM

## 2011-03-05 ENCOUNTER — Other Ambulatory Visit: Payer: Medicare Other

## 2011-03-08 NOTE — Procedures (Signed)
NAME:  [name], [name] NO.:  [id number]   MEDICAL RECORD NO.:  [id number]           PATIENT TYPE:   LOCATION:                                 FACILITY:   PHYSICIAN:  Erick Colace, M.D.DATE OF BIRTH:  03/02/1956   DATE OF PROCEDURE:  DATE OF DISCHARGE:                               OPERATIVE REPORT   Procedure is a right knee injection.   INDICATIONS:  Right knee osteoarthritis.  Pain is only partially  responsive to medication management and other conservative care.   She has had some relief from knee injections in the past but more on the  left side than right side.   Informed consent was obtained after describing risks and benefits of the  procedure with the patient.  These include bleeding, bruising,  infection.  She elects to proceed and has given written consent.  The  patient placed in a supine position, medial approach utilized.  Area  marked and prepped with Betadine and alcohol, entered with 25-gauge inch  and a half needle.  A 1 mL of 40 mg/mL Depo-Medrol and 4 mL of 1%  lidocaine injected after negative drawback for blood.  The patient  tolerated the procedure well.  Postinjection instructions given.      Erick Colace, M.D.  Electronically Signed     AEK/MEDQ  D:  06/09/2009 17:26:53  T:  06/10/2009 03:25:02  Job:  [id number]
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
## 2011-03-08 NOTE — Discharge Summary (Signed)
NAME:  [name], [name]            ACCOUNT NO.:  [id number]   MEDICAL RECORD NO.:  [id number]          PATIENT TYPE:  OIB   LOCATION:  9318                          FACILITY:  [facility]   PHYSICIAN:  Sherron Monday, MD        DATE OF BIRTH:  [birth year]   DATE OF ADMISSION:  09/01/2008  DATE OF DISCHARGE:  09/02/2008                               DISCHARGE SUMMARY   PROCEDURE:  Total vaginal hysterectomy with anterior and posterior  repair.   HISTORY OF PRESENT ILLNESS:  A 55 year old, G3, P1-2-0-3, who had  complaints of pelvic relaxation as well as urinary incontinence prior to  surgery.  She was evaluated by [facility] who felt that she would  not benefit from urologic procedure, but did agree with the plan for  total vaginal hysterectomy and A and P repair.  This was discussed with  the patient including risks, benefits, alternatives, and she wished to  proceed.   Medical history is significant for asthma, low back pain,  hypercholesterolemia, DVTs, and a PE after delivery.   Past surgical history is significant for tubal ligation, spinal fusion,  cholecystectomy, bladder tack and sling, bilateral bone spur removal,  and bilateral carpal tunnel release.   ALLERGIES:  No known drug allergies.   MEDICATIONS:  Vicodin, clorazepate, Daypro, multivitamins, omeprazole,  Vytorin, cyclobenzaprine, Colace, baby aspirin, lidocaine ointment,  albuterol, and Lidoderm patch.   SOCIAL HISTORY:  She denies tobacco or drug use, and she is an  occasional alcohol user.  She is married and a homemaker on disability.   PAST OB/GYN HISTORY:  No history of abnormal Pap smears or any sexually  transmitted diseases.  A G3, P1-2-0-3, with three vaginal deliveries,  the third of which she had a pulmonary embolism following the delivery.   Family history is significant for diabetes, coronary artery disease,  hypertension, colon cancer, skin cancer, breast cancer.   Her physical exam on admission was  benign.  Her hospital course was  relatively uncomplicated.  She underwent a total vaginal hysterectomy  and A and P repair on September 01, 2008, without complications.  Her EBL  was approximately 200 mL.  Her postoperative course was relatively  uncomplicated.  She remained afebrile.  Vital signs stable throughout.  On postoperative day #1, she was ambulating, voiding without difficulty.  She did complain of a headache that morning.  She states she did have a  history of migraines and was given Midrin to help with this.  Her  hemoglobin decreased from 13.7.  Her creatinine was stable at 0.6.  She  had a benign exam and was discharged home with routine discharge  instructions and numbers to call with any questions or problems as well  as prescriptions  for Percocet, ibuprofen, and Lovenox.  She was instructed to use the  Lovenox for 27 more doses given the recommendations for chronic  prophylaxis.  She voiced understanding if this and will follow up in the  office in approximately 2-3 weeks.      Sherron Monday, MD  Electronically Signed     JB/MEDQ  D:  09/02/2008  T:  09/02/2008  Job:  [id number]
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
## 2011-03-08 NOTE — Op Note (Signed)
NAME:  [name], [name]            ACCOUNT NO.:  [id number]   MEDICAL RECORD NO.:  [id number]          PATIENT TYPE:  OIB   LOCATION:                                FACILITY:  [facility]   PHYSICIAN:  Sherron Monday, MD        DATE OF BIRTH:  [birth year]   DATE OF PROCEDURE:  09/01/2008  DATE OF DISCHARGE:  09/02/2008                               OPERATIVE REPORT   PREOPERATIVE DIAGNOSIS:  Pelvic relaxation.   POSTOPERATIVE DIAGNOSIS:  Pelvic relaxation.   PROCEDURE:  Total vaginal hysterectomy, anterior and posterior  colporrhaphy, uterosacral ligament plication.   SURGEON:  Sherron Monday, MD   ASSISTANT:  Malachi Pro. Ambrose Mantle, MD   ANESTHESIA:  General endotracheal.   COMPLICATIONS:  None.   ESTIMATED BLOOD LOSS:  200 mL.   URINE OUTPUT:  200 mL clear urine at the end of procedure.   FLUIDS:  1900 mL.   FINDINGS:  Normal size uterus with 2 small fibroids.  Normal-appearing  tubes and ovaries.   PROCEDURE:  After informed consent was reviewed with the patient  including risks, benefits, and alternatives of the procedure, she was  transported to the operating room, placed on the table in supine  position and then she was placed in the [place] stirrups in lithotomy  position to verify her comfort.  General anesthesia was induced and  found to be adequate.  She was then prepped and draped in the normal  sterile fashion.  Foley was steriley placed.  Using a heavy weigthed  speculum and simms retractor, her cervix was easily visualized.  Vasopressin was used to infiltrate the cervical mucosa.  The uterus was  circumferentially incised with Bovie cautery.  Attempt was made to get  in anteriorly was unsuccessful.  Posteriorly, the posterior cul-de-sac  was entered sharply without difficulty.  Heaney clamps were placed over  the uterosacral ligaments bilaterally, transected, and doubly ligated  with 0 Vicryl.  Hemostasis was assured.  The cardinal ligaments were  clamped on both sides,  transected, and suture ligated in a similar  fashion.  The anterior cul-de-sac was then entered sharply.  The uterine  arteries and broad ligament were serially clamped with Heaney clamps,  transected, and suture ligated.  Excellent hemostasis was visualized.  Both the cornua were clamped with Heaney clamps, transected, and the  uterus was delivered.  The pedicles were doubly ligated with excellent  hemostasis.  The uterosacral ligaments were plicated using 0 Vicryl.  The sutures that had been used to hold the uterosacral ligaments were  then tied together and hemostasis was assured.  The anterior repair was  then performed and the mucosa was incised, and the mucosa was then  dissected to the fascia.  The fascia was made hemostatic and brought  together in the midline for better bladder support using 2-0 Vicryl.  The vaginal mucosa was trimmed, and the mucosa was closed with 3-0  Vicryl including closing the vaginal cuff.  Attention was then turned to  the posterior repair at the level of the hymenal ring.  The mucosa was  incised and separated up to  the level of the cuff using blunt and sharp  dissection.  The fascia was then identified and brought together in the  midline using 2-0 Vicryl sutures.  This was closed with 3-0 Vicryl.  Hemostasis was assured with Bovie cautery.  Foley catheter was then  placed, and the vagina was packed with Estrace-coated packing, and  sponge, lap, and needle counts were correct x2.  The patient was  awakened in stable condition and transported to the PACU following the  procedure.      Sherron Monday, MD  Electronically Signed     JB/MEDQ  D:  09/02/2008  T:  09/02/2008  Job:  [id number]

## 2011-03-08 NOTE — H&P (Signed)
NAME:  [name], [name]            ACCOUNT NO.:  [id number]   MEDICAL RECORD NO.:  [id number]          PATIENT TYPE:  AMB   LOCATION:  SDC                           FACILITY:  [facility]   PHYSICIAN:  Sherron Monday, MD        DATE OF BIRTH:  Feb 16, 1956   DATE OF ADMISSION:  DATE OF DISCHARGE:                              HISTORY & PHYSICAL   PROCEDURE PLANNED:  Total vaginal hysterectomy with anterior and  posterior repair.   HISTORY OF PRESENT ILLNESS:  A 55 year old G3, P 1-2-0-3 who presents  initially with complaints of pelvic relaxation.  She states she has had  pain and pressure in her pelvis that she states is worse on the day.  She has had no problems with irregular vaginal bleeding.  She has  complained of some issues with incontinence of both stress and urge  type, as well as occasional splinting.  She has been evaluated by Dr.  Annabell Howells with [facility], who states that she is a Valsalva voider  and would not benefit from a splint.  We will proceed with our surgery,  TVH, A & P repair.   PAST MEDICAL HISTORY:  Significant for asthma, low back pain,  hypercholesterolemia, DVT below the knee, as well as the PE after a  delivery.   PAST SURGICAL HISTORY:  Significant for bilateral tubal ligation, spinal  fusion in 1989 and 1991, cholecystectomy in 1996, bladder tack and sling  in 1996, and bilateral bone spur removal, and bilateral carpal tunnel  release.   ALLERGIES:  No known drug allergies.   MEDICATIONS:  Vicodin, clorazepate, Daypro, multivitamins, omeprazole,  Vytorin, cyclobenzaprine, Colace, baby aspirin, lidocaine ointment,  albuterol, and Lidoderm patch.   SOCIAL HISTORY:  She lives with a smoker, but denies alcohol or tobacco  use.  She is occasional alcohol user and no drug use.  She is married  and homemaker on disability.   PAST OB/GYN HISTORY:  She has no history of any abnormal Pap smear last  was in January 2009, no history of any sexually transmitted  diseases.  She is currently not sexually active.  Menarche at age 54, menopause in  2007, and has had no post menopausal bleeding.  She denies discharge,  itch, or odor.  She is a G3, P 1-2-0-3 with a term delivery in 1976,  female infant weighing 8 pounds, 35-week vaginal delivery in 25 of a  female infant weighing 7 pounds and 6 ounces, 34-week vaginal delivery in  1984 for a female infant weighing 7 pounds and 4 ounces.  Following this  pregnancy, she had a pulmonary embolism.   FAMILY HISTORY:  Significant for diabetes in maternal grandmother,  maternal aunt, maternal uncle, maternal cousin, coronary artery disease  in mother, father, brother, maternal grandmother, and paternal  grandmother; hypertension in father; paternal grandmother with colon  cancer; father with skin cancer, maternal aunt with breast cancer,  maternal uncle with colon cancer.   Her cholesterol is followed by her PCP.  Her recent mammogram was in  January 2009.  She is up to date with her colonoscopy which revealed  polyp.  She had a bone density scan in 2008.   PHYSICAL EXAMINATION:  GENERAL:  She is afebrile with vital signs stable  and hemoglobin of 12.5.  No apparent distress.  CARDIOVASCULAR:  Regular rate and rhythm.  LUNGS:  Clear to auscultation bilaterally.  HEENT:  Mucous membranes are moist.  NECK:  Without lymphadenopathy.  Normal thyroid.  BACK:  No costovertebral angle tenderness.  BREASTS:  No mass, nontender, and no distortion.  ABDOMEN:  Soft, nontender, no distortion, positive bowel sounds.  EXTREMITIES:  Symmetric and nontender.  PELVIC:  Normal external female genitalia, normal Bartholin's, urethra,  and Skene glands.  She has atrophic with 3+ cystocele and 2+ rectocele.  No cervix and vagina without lesions.  No cervical motion tenderness.  Normal uterus.  Adnexa no masses and nontender.  Exam is limited by her  habitus.   ASSESSMENT AND PLAN:  A 55 year old female G3, P1-2-0-3 for a  total  vaginal hysterectomy, possible bilateral salpingo-oophorectomy, and  anterior and posterior repair.  We discussed with the patient risks,  benefits, and alternatives of surgical procedure, especially in light of  her given history of clot.  She voices understanding with all these and  wishes to proceed, consulted Anesthesiology, as well as other resources  are informed with her increased risk of clotting.  The decision was made  to anticoagulate her with low-molecular weight heparin.  She will  receive her first injection approximately 12 hours prior to the surgery  and for 28 days after the surgery will self-administer this medication.  She has also discontinued her Daypro and her baby aspirin approximately  a week before the surgery.  She is ready to proceed on September 01, 2008.      Sherron Monday, MD  Electronically Signed     JB/MEDQ  D:  08/29/2008  T:  08/29/2008  Job:  [id number]

## 2011-03-11 NOTE — Op Note (Signed)
NAME:  [name], [name] NO.:  [id number]   MEDICAL RECORD NO.:  [id number]          PATIENT TYPE:  AMB   LOCATION:  ENDO                         FACILITY:  [facility]   PHYSICIAN:  Bernette Redbird, M.D.   DATE OF BIRTH:  July 18, 1956   DATE OF PROCEDURE:  04/14/2005  DATE OF DISCHARGE:                                 OPERATIVE REPORT   PROCEDURE:  Colonoscopy with biopsies.   INDICATIONS:  This is a 55 year old female with a remote history of an  adenomatous polyp having been removed, with her most recent colonoscopy 3-  1/2 years ago having been negative for adenomas.  Previous random colonic  biopsies had shown evidence of nonspecific colitis, although the more recent  ones were negative for inflammation.  With that background, the patient has  a history of significant intermittent diarrhea although, ironically, she has  been constipated for the past week.   FINDINGS:  Poor prep.  Minimal diverticulosis.  Grossly normal exam to the  cecum.   PROCEDURE:  The nature, purpose and risks of the procedure were familiar to  the patient from prior examination.  She provided written consent.  Sedation  was fentanyl 140 mcg and Versed 15.5 mg IV without arrhythmias or  desaturation.  This was administered prior to and during the course of this  lengthy procedure.   The Olympus adult adjustable video colonoscope was advanced to the cecum as  confirmed by clear visualization of the ileocecal valve, although a discrete  appendiceal orifice was not seen.  Pullback was then performed around the  colon.   This exam took about an hour because we had to irrigate copiously throughout  the exam to clean off pea-soup and slightly smudgy, pasty stool.  We  suctioned out two canisters of irrigant fluid during the course of this  procedure but by copious irrigation as described, I was able to clean things  up to the point where a fairly good evaluation of the mucosa was achieved  and it  is not felt that any significant lesions would have been missed.   There was no evidence of polyps, cancer, colitis or vascular malformations  on this exam; there was minimal sigmoid diverticulosis.  No source of  diarrhea was endoscopically evident, but I took random colonic biopsies from  the proximal colon to the rectum, retroflexing in the rectum prior to the  rectal biopsies, and these biopsies were obtained to look for evidence of  collagenous or microscopic colitis.   The patient tolerated the procedure well, and there were no apparent  complications.   IMPRESSION:  1.  Diarrhea, without source endoscopically evident (564.5).  2.  Prior history of colonic adenoma without evidence of recurrence at this      time.  3.  Minimal diverticular change in the sigmoid region.  4.  Poor prep which compromised the exam slightly but would not likely have      obscured any major lesions.   PLAN:  Await pathology.  Probable colonoscopic follow-up in two years.       RB/MEDQ  D:  04/14/2005  T:  04/14/2005  Job:  [id number]   cc:   Aida Puffer  [address]  [place]  [place] [address]  Fax: [phone]

## 2011-03-11 NOTE — Consult Note (Signed)
NAME:  [name], [name]                      ACCOUNT NO.:  [id number]   MEDICAL RECORD NO.:  [id number]                   PATIENT TYPE:  REC   LOCATION:  TPC                                  FACILITY:  [facility]   PHYSICIAN:  Zachary George, DO                      DATE OF BIRTH:  05/15/1956   DATE OF CONSULTATION:  11/25/2002  DATE OF DISCHARGE:                                   CONSULTATION   REFERRING PHYSICIAN:  Bradly Bienenstock, M.D.  Dear Dr. Rondel Jumbo,   Thank you very much for kindly referring [name] to [facility] for evaluation.  The patient was seen  in our clinic today.  Please refer to the following for details regarding  the history, physical examination and treatment recommendations.  Once  again, thank you for allowing [place] to participate in the care of this patient.   CHIEF COMPLAINT:  Back pain.   HISTORY OF PRESENT ILLNESS:  The patient is a pleasant 55 year old right  hand dominant female who was kindly referred by Dr. Rondel Jumbo for evaluation for  pain management.  The patient states she injured her lower back while  working in 1985 at [facility] when she fell off a step ladder. She states  she messed up five discs, three in her neck, one in her mid back and one in  her lower back.  She has undergone two lumbar surgeries.  Her first surgery  was in 1992.  She stated this was a posterior fusion at L4-L5 performed by a  surgeon in [place].  She underwent a second surgery in 1994 in [place], [place] which she describes as an anterior fusion at L4-L5.  She has had chronic low back pain since but states that it has gotten  somewhat worse over the past year with recurrent exacerbations.  Currently  her pain is fairly well controlled.  She rates her pain as a 4/10 on a  subjective scale and describes it as constant, achy, with occasional sharp  pain with exacerbations.  Her pain radiates to her bilateral hips and  posterior lateral thighs occasionally.  The pain does occasionally go to her  big toes but this is rare.  Her symptoms are worse with walking, bending,  sitting, working and therapy and improve with rest and medications which  currently include Mobic 7.5 mg b.i.d., Celebrex 200 mg daily and Hydrocodone  or Tylenol #3 sparingly.  She states she gets fairly good relief with  Hydrocodone 5 mg/500 mg although it makes her somewhat drowsy.  She brings  in her pills with her and has 9 of 21 pills remaining from her prescription  that was filled on October 22, 2002.  She also takes Tylenol #3 which does  not help as much as Hydrocodone but does not make her drowsy.  She takes  this  sparingly as well and brings the pills with her and has 13 of 30 pills  remaining from prescription written on October 22, 2002.  She has not tried  Ultram, Ultracet or Neurontin or any other anti-epileptic medication.  She  has tried Lidoderm patches in the past which she states have helped.   Her function and quality of life indices have declined.  Her sleep is poor  but improved with Doxepin 100 mg which she takes for migraine headaches.  She has had an MRI of her lumbar spine performed prior to her second  surgery.  I reviewed her health and history form and 14 point review of  systems.  The patient denies bowel or bladder dysfunction, denies fevers,  chills, night sweats, weight loss.  The patient admits to some wheezing at  night, headaches and numbness occasionally in the lower extremities.   PAST MEDICAL HISTORY:  Hypercholesterolemia, gastroesophageal reflux  disease, migraine headaches, carpal tunnel syndrome for which she is  followed by Dr. Cindee Salt.   PAST SURGICAL HISTORY:  Spinal fusion X2, carpal tunnel release right hand,  bone spurs both feet, cholecystectomy, tubal ligation.   FAMILY HISTORY:  Heart disease, lung disease, cancer, diabetes, disability,  hypertension.   SOCIAL HISTORY:  The  patient denies smoking or illicit drug use.  She admits  to occasional alcohol use.  She is married and is not currently working  secondary to disability.  She denies history of alcohol or drug addiction or  abuse.   ALLERGIES:  No known drug allergies.   MEDICATIONS:  Prilosec, Zocor, atenolol, Doxepin, Mobic 7.5 mg b.i.d.,  Celebrex 200 mg daily, Vicodin 5 mg as needed, Tylenol #3 as needed, vitamin  B12, vitamin B6, Midrin as needed.   PHYSICAL EXAMINATION:  GENERAL:  Examination reveals an obese female in no  acute distress.  Mood and affect are appropriate.  Patient is alert and  oriented.  VITAL SIGNS: Blood pressure 130/65. Pulse 67.  Respirations 20.  Oxygen  saturation 99% on room air.  MUSCULOSKELETAL:  Examination of the back reveals a level pelvis without  scoliosis.  There is increased lumbar lordosis.  There is a large vertical  midline incision with scar.  The patient has full range of motion of the  lumbar spine with pain on extension plus rotation bilaterally, more so than  just on extension alone.  She has no pain on flexion.  Palpatory examination  reveals tenderness to palpation bilateral lumbosacral paraspinous muscles.  Manual muscle testing is 5/5 bilateral lower extremities.  Sensory  examination is intact to light touch bilateral lower extremities.  Muscle  stretch reflexes are 2+/4 bilateral patellar, medial hamstrings, and  Achilles.  There is no atrophy noted in the lower extremities.  No abnormal  tone is noted in the lower extremities.  No ankle clonus noted.  There is no  heat, erythema or edema in the lower extremities.   IMPRESSION:  1. Chronic low back pain.  2. Degenerative disc disease of the lumbar spine, status post L4-L5 fusion     X2.  According to the patient's report she has had both anterior and     posterior fusion at the L4-L5 level.  3. Suspect lumbar facet arthropathy may be contributing to patient's lower    back pain given physical  examination findings.   RECOMMENDATIONS:  1. Discussed treatment options for the patient to include medication     management, therapy options as well as minimally invasive procedures to  help control her pain.  In terms of minimally invasive procedures I would     consider a trial of lumbar facet injections as her physical examination     suggests a component of lumbar facet arthropathy which is likely     contributing to her lower back pain.  The patient states at this time     that she does not feel like her pain is bad enough to warrant any     interventional procedures.  2. In terms of therapy options I recommend a resumption of a home exercise     program for stretching as the patient has tight hamstrings and hip     flexors bilaterally which may be contributing to her low back pain.  3. In terms of medications will give the patient a trial of Ultracet one to     two q.i.d. as needed #20 sample pills given.  The patient is instructed     to use this instead of Hydrocodone or Tylenol #3 and we will monitor its     effect.  She may call our clinic for a prescription if it is helpful.  4. Lidoderm 5% apply up to 12 hours per day, maximum 3 packs at a time, #30     without refills.  5. In terms of Hydrocodone and Tylenol #3, the patient has been taking this     very sparingly and appropriately.  I did not feel like she is abusing     these medications in any way.  Would like her to again have a trial of     Ultracet to see if the narcotic-based pain medication can be discontinued     altogether.  In addition, would like her to continue an exercise program     to try to recondition her core stabilizing muscles.  6. If symptoms are not improving the patient should give consideration to     minimally invasive procedures and I have discussed this with and she     agrees.  7. The patient is to follow up with her primary care Pattrick Bady.  8. The patient is to return to the clinic on an as  needed basis.   The patient was educated regarding the above findings and recommendations  and understands.  There were no barriers to communication.                                               Zachary George, DO    JW/MEDQ  D:  11/25/2002  T:  11/25/2002  Job:  [id number]   Cc ;  Lynnea Ferrier Hecox  M.D.

## 2011-03-11 NOTE — Cardiovascular Report (Signed)
NAME:  [name] NO.:  [id number]   MEDICAL RECORD NO.:  [id number]          PATIENT TYPE:  OIB   LOCATION:  2899                         FACILITY:  [facility]   PHYSICIAN:  Cristy Hilts. Jacinto Halim, MD       DATE OF BIRTH:  July 30, 1956   DATE OF PROCEDURE:  10/10/2005  DATE OF DISCHARGE:  10/10/2005                              CARDIAC CATHETERIZATION   REFERRING PHYSICIAN:  Aida Puffer, M.D.   PROCEDURE PERFORMED:  Right and left heart catheterization, including:   1.  Right heart hemodynamics and calculation of cardiac output and cardiac      index by Fick.  2.  Left ventriculography.  3.  Selective right and left coronary arteriography.  4.  Ascending aortogram.  5.  Abdominal aortogram.  6.  Right femoral angiography for closure of right femoral arterial access      with Star Close.   INDICATIONS:  [name] is a 55 year old Caucasian female with a history  of multiple surgeries in the past for back pain, history of hypertension,  hyperlipidemia, degenerative joint disease and irritable bowel syndrome,  presenting with shortness of breath and chest discomfort.  She also has  morbid obesity.  She had previously undergone a Cardiolite stress test in  June of this year, which was equivocal.  Given this, she was brought to the  catheterization lab given her body habitus for a definitive diagnosis of  coronary disease.  Right heart catheterization was done to evaluate for  pulmonary hypertension.   HEMODYNAMIC DATA:   RIGHT HEART CATHETERIZATION:  1.  Aortic pressures were 12/9 with a mean of 8 mmHg.  2.  RV pressures were 33/5 with end-diastolic pressure of 10 mmHg.  3.  PA pressures were 32/18 with a mean of 25 mmHg.  4.  Pulmonary capillary wedge was 18/18 with a mean of 16 mmHg.  5.  The PA saturation was 68% and FA saturation was 96%.  6.  The cardiac output by Fick was 4.6 with a cardiac index of 2.1.   LEFT HEART CATHETERIZATION:   HEMODYNAMIC DATA:  1.  The left ventricular pressure was 120/3 with an end-diastolic pressure      of 16 mmHg.  2.  The aortic pressure was 124/68 with a mean of 94 mmHg.  3.  There was no pressure gradient across the aortic valve.   ANGIOGRAPHIC DATA:  1.  Left ventricle:  Left ventricular systolic function was normal and the      ejection fraction was estimated at 60%.  There was no significant mitral      regurgitation.  2.  Right coronary artery:  The right coronary artery is a very large-      caliber vessel and the dominant vessel, which gives origin to a large      PDA and a large PLA.  It is a superdominant vessel.  It is normal.  3.  Left main:  The left main is a large-caliber vessel and is normal.  4.  Left circumflex:  The left circumflex is a moderate-caliber vessel.  It  gives origin to a small to moderate obtuse marginal 1 after continuing      as an OM-2.  5.  Left anterior descending artery:  The LAD is a large-caliber vessel that      gives origin to a large diagonal 1 and the LAD itself ends at the apex.      The LAD and the diagonal 1 are normal.   ASCENDING AORTOGRAM:  The ascending aortogram revealed the presence of three  aortic valve cusps.  There is no evidence of aortic regurgitation.   ABDOMINAL AORTOGRAM:  Abdominal aortogram revealed the presence of two renal  arteries, one on either side.  They were widely patent.  There is no  evidence of abdominal aortic aneurysm.   IMPRESSION:  1.  Normal coronary arteries with normal left ventricular systolic function.      The right coronary artery is superdominant.  2.  Normal ascending aorta and abdominal aorta.  No evidence of renal artery      stenosis.  No evidence of ascending aortic aneurysm or abdominal aortic      aneurysm.  3.  Normal right heart pressures.  No evidence of pulmonary hypertension.   RECOMMENDATIONS:  Given her findings, continue prior medical regimen with  risk factor modification as indicated.   Evaluation for a noncardiac cause of  her chest pain is indicated.  Continued primary prevention is indicated, and  continued risk factor modification is indicated.  Evaluation for a  noncardiac cause of chest pain is indicated.  The patient will follow up  with Dr. Aida Puffer.   TECHNIQUE OF THE PROCEDURE:  Using the usual sterile precautions, using a 7  French right femoral venous access and a 6 French right femoral arterial  access, right and left heart catheterization was performed.   A balloon-tip Swan-Ganz catheter was advanced via the right femoral vein  into the pulmonary artery and pulmonary capillary wedge was easily obtained.  PA saturation was obtained.  Hemodynamics of the PA, RV, RA were performed  and then the catheter was brought out of the body in the usual fashion.   TECHNIQUE OF LEFT HEART CATHETERIZATION:  Using a 6 [place] multipurpose B2  catheter, the catheter was advanced into the ascending aorta over a 0.035  inch J-wire.  The catheter was gently advanced to the left ventricle and  left ventricular pressures were monitored.  Hand contrast injection of the  left ventricle was performed both in the LAO and RAO projection.  The  catheter was flushed with saline and pulled back into the ascending aorta  and PG across the AV was monitored.  The right coronary artery was  selectively engaged and angiography was performed.  In a similar fashion,  the left main coronary artery was selectively engaged and angiography was  performed.  The catheter pulled back into the root of the aorta and the  ascending aortogram was performed in the LAO projection.  The catheter was  then pulled back in the abdominal aorta and abdominal aortogram was  performed.  The catheter was then pulled out of the body in the usual  fashion.  Right femoral angiography was performed through an arterial access sheath, and the access was closed with Star Close with excellent hemostasis.  The venous  sheath was pulled in the catheterization lab and manual pressure  was held with adequate hemostasis obtained.  The patient tolerated the  procedure well.  A total of 90 mL of contrast was utilized for  the  diagnostic angiography.      Cristy Hilts. Jacinto Halim, MD  Electronically Signed     JRG/MEDQ  D:  10/10/2005  T:  10/12/2005  Job:  [id number]   cc:   Aida Puffer  Fax: [phone]

## 2011-03-11 NOTE — Consult Note (Signed)
[place]. [facility]  Patient:    [name], [name] Visit Number: [id number] MRN: [id number]          Service Type: SUR Location: 3W 0356 02 Attending Physician:  Evlyn Clines Dictated by:   Excell Seltzer. Annabell Howells, M.D. Proc. Date: 04/09/02 Admit Date:  04/08/2002                            Consultation Report  CHIEF COMPLAINT:  Right abdominal pain.  HISTORY OF PRESENT ILLNESS:  This is a 55 year old white female with the onset Friday of right lower quadrant pain.  This has become progressively more severe.  She came to the emergency room this morning with fever to 102 and pain.  She has also had significant symptoms of urinary urgency and dysuria. In the emergency room she developed nausea and vomiting.  A urinalysis revealed both white and red cells.  Her white count was 15,000, and she had a fever to 102.  A CT scan was done with and without contrast.  This revealed right hydronephrosis with dilation of the ureter to the pelvic brim but without obvious stone.  PAST MEDICAL HISTORY:  Pertinent for migraines, hiatal hernia, hypercholesterolemia, chronic back pain, with degenerative disease and arthritis.  PAST SURGICAL HISTORY:  Pertinent for cholecystectomy, a Ray Cage spinal fusion, surgery on her foot and right carpal tunnel.  She is gravida 3, para 3, with normal vaginal deliveries.  ALLERGIES:  No drug allergies.  MEDICATIONS:  Prilosec, Zocor, Celebrex, codeine, hydrocodone, doxepin, atenolol.  SOCIAL HISTORY:  Negative for tobacco or alcohol.  She is disabled.  FAMILY HISTORY:  Unremarkable.  REVIEW OF SYSTEMS:  She has had fever to 102.  She has had nausea and vomiting this morning.  She has had dysuria and urinary frequency.  She denies diarrhea or constipation.  She does have chronic back pain.  She has some knee pain. She is otherwise without complaints.  PHYSICAL EXAMINATION:  VITAL SIGNS:  Temperature 97.8, pulse 73,  respirations 24, blood pressure 140/72.  GENERAL:  She is a well-developed, well-nourished white female in no acute distress, alert and oriented x3.  HEENT:  Normocephalic, atraumatic.  NECK:  Supple without mass.  CHEST:  Lungs clear to auscultation, normal effort.  CARDIAC:  Regular rate and rhythm.  ABDOMEN:  Soft, obese, with right flank tenderness and right CVA tenderness. No mass, hepatosplenomegaly, or hernias are noted.  LYMPHATIC:  She has no cervical or inguinal adenopathy.  GENITOURINARY:  Normal external genitalia.  Urethral meatus unremarkable.  She does have mild urethral hypermobility.  Anus and perineum without lesions.  No pelvic masses are noted.  EXTREMITIES:  Full range of motion without edema.  NEUROLOGIC:  She is grossly intact.  SKIN:  Warm and dry.  LABORATORY DATA:  I personally reviewed her CT scan and lab work.  IMPRESSION:  Right hydronephrosis with urinary tract infection, question stone versus stricture.  PLAN:  Cystoscopy, right retrograde pyelograms with possible ureteroscopy and possible stent.  She received Tequin and will be continued on antibiotics. The risks of the cystoscopy procedure were explained to the patient. Dictated by:   Excell Seltzer. Annabell Howells, M.D. Attending Physician:  Evlyn Clines DD:  04/09/02 TD:  04/10/02 Job: [id number]

## 2011-03-11 NOTE — Procedures (Signed)
[place]. [facility]  Patient:    [name], [name] Visit Number: [id number] MRN: [id number]          Service Type: END Location: ENDO Attending Physician:  Rich Brave Dictated by:   Florencia Reasons, M.D. Proc. Date: 07/09/01 Admit Date:  07/09/2001                             Procedure Report  PROCEDURE PERFORMED:  Colonoscopy with biopsies.  ENDOSCOPIST:  Florencia Reasons, M.D.  INDICATIONS FOR PROCEDURE:  The patient is a 55 year old with a prior history of a significant colonic adenoma having been removed from the region of the rectosigmoid junction about four years ago, with subsequent sigmoidoscopic evaluation having been negative.  She has significant postprandial diarrhea suggestive of irritable bowel syndrome.  FINDINGS:  Diminutive polyp at 15 cm.  Mild sigmoid diverticulosis.  DESCRIPTION OF PROCEDURE:  The nature, purpose and risks of the procedure were familiar to the patient and she provided written consent.  Sedation was fentanyl 75 mcg and Versed 7.5 mg IV without arrhythmias or desaturation.  The Olympus adult video colonoscope was advanced to the terminal ileum which was entered for a short distance and appeared normal.  Random biopsies were obtained and pull-back was then performed.  The quality of the prep was excellent and it is felt that all areas were well seen.  There was a tiny 2 to 3 mm sessile polyp at about 15 cm removed by a single cold biopsy.  There was mild to moderate sigmoid diverticulosis.  The colonic mucosa was normal.  I did not see any evidence of colitis to account for her diarrhea but I obtained random biopsies not only from the terminal ileum as mentioned above, but also randomly along the length of her colon.  Apart from the tiny polyp mentioned above, no additional polyps were seen, and there was no evidence of cancer or vascular malformations. The patient tolerated the procedure well  and there were no apparent complications.  IMPRESSION: 1. Diminutive colonic polyp. 2. Mild sigmoid diverticulosis. 3. Diarrhea, without evident cause on todays exam.  PLAN:  Await pathology.  I have encouraged the patient to follow up with me in the office for consideration of initiation of Robinul and/or other strategies to try to manage her postprandial diarrhea. Dictated by:   Florencia Reasons, M.D. Attending Physician:  Rich Brave DD:  07/09/01 TD:  07/09/01 Job: [id number]

## 2011-03-11 NOTE — Procedures (Signed)
NAME:  Hannah, Montoya NO.:  [id number]   MEDICAL RECORD NO.:  [id number]          PATIENT TYPE:  REC   LOCATION:  OREH                         FACILITY:  [facility]   PHYSICIAN:  Erick Colace, M.D.DATE OF BIRTH:  [birth year]   DATE OF PROCEDURE:  DATE OF DISCHARGE:                               OPERATIVE REPORT   PROCEDURE:  This is a left intra-articular glenohumeral injection under  fluoroscopic guidance.   INDICATIONS:  Left glenohumeral arthritis with previous good results  using injection under fluoro guidance.  Pain is only partially  responsive to medication management and other conservative care.  Interferes with mobility and self-care.   Informed consent was obtained after describing the risks and benefits of  the procedure with the patient.  These include bleeding, bruising, and  infection.  She elects to proceed and given written consent.  The  patient was placed in a prone position.  Posterolateral approach  utilized.  A 25-gauge 3-inch needle inserted under fluoroscopic guidance  into the glenohumeral joint.  Omnipaque 180 confirmed proper location  followed by injection of 1 mL of 40 mg/mL Depo-Medrol, 3 mL of 1%  lidocaine.  The patient tolerated the procedure well.  Pre- and post-  injection and vitals stable.  Post-injection instructions given.      Erick Colace, M.D.  Electronically Signed     AEK/MEDQ  D:  07/09/2009 11:34:08  T:  07/10/2009 01:51:17  Job:  [id number]

## 2011-03-11 NOTE — Op Note (Signed)
NAME:  [name], [name]            ACCOUNT NO.:  [id number]   MEDICAL RECORD NO.:  [id number]          PATIENT TYPE:  AMB   LOCATION:  DAY                          FACILITY:  [facility]   PHYSICIAN:  Excell Seltzer. Annabell Howells, M.D.    DATE OF BIRTH:  12-Jun-1956   DATE OF PROCEDURE:  06/28/2005  DATE OF DISCHARGE:                                 OPERATIVE REPORT   PROCEDURE:  SPARC suburethral sling and Perigee anterior vault suspension.   PREOPERATIVE DIAGNOSIS:  Cystocele with stress incontinence.   POSTOPERATIVE DIAGNOSIS:  Cystocele with stress incontinence.   SURGEON:  Dr. Bjorn Pippin   ASSISTANT:  Dr. Lorin Picket MacDiarmid   ANESTHESIA:  General.   BLOOD LOSS:  500 mL.   DRAINS:  Foley catheter and vaginal pack.   COMPLICATIONS:  None.   INDICATIONS:  Ms. Rossa is a 55 year old white female with a cystocele and  stress incontinence, who is to undergo a SPARC suburethral sling and also a  Perigee anterior vault suspension   FINDINGS AT PROCEDURE:  The patient was given Cipro 500 mg p.o.  preoperatively.  She was taken to the operating room where she was fitted  with PAS hose.  A general anesthetic was induced.  She was placed in  lithotomy position, and perineum and genitalia were shaved.  She was prepped  with Betadine solution and draped in the usual sterile fashion.  A Foley  catheter was inserted.  The bladder was drained.  A weighted vaginal  speculum was placed.  The anterior vaginal wall was infiltrated with  approximately 10 mL of 1% lidocaine with epinephrine and an anterior vaginal  wall midline incision was made with a knife.  This was from the bladder neck  to the level of the cervix.  She did have some cervical descent as well that  had not been as apparent at the time of office exam.  Once the incision had  been made, the vaginal mucosal flaps were elevated off the pubovesical  fascia out laterally until the ischial spines could be readily palpated.  The cystocele bulge was  then reduced using 3 interrupted horizontal mattress  2-0 Vicryl sutures.  Once this had been performed, the incision sites were  marked for the Perigee limbs.  The adductor longus tendon was grasped to  determine its location.  An incision was made 5 cm lateral to the pubis and  the left labial crease below the adductor longus tendon.  This was then  repeated on the right side.  The right C needle was then passed under finger  guidance through the transobturator foramen out into the vaginal vault.  Inspection revealed no evidence of vaginal mucosal perforation.  This was  then repeated on the left side in a similar fashion.  At this point, a  Perigee kit was opened and prepared.  The Intexen porcine dermis graft was  chosen.  The distal limbs were then snapped to the C needles and drawn  partially into position.  The graft was then was reflected onto the anterior  abdominal wall, and we turned our attention to the posterior needle  passes.  The inferior aspect of the obturator foramen was palpated, and incisions  were made approximately 3 cm inferior to and 2 cm lateral to the anterior  incisions.  Once both incisions were made, the helical needles were then  passed without difficulty until the tips could be palpated just inferior and  posterior to the iliac spine.  First, the left needle was passed and the  proximal left limb was snapped to the C-needle and partially drawn back into  the wound, and then the right needle was passed, and the limb was snapped  and withdrawn.  All four limbs had been passed.  The graft was trimmed to  length and secured at the bladder neck level in the midline and just caudad  to the cervix.  Once the mesh had been secured the midline, the limbs were  drawn into final position with the shoulders of the graft right at the  pelvic sidewalls.  Once this had been performed, the anterior vaginal wall  was closed using running locked 2-0 Vicryl suture.  There was a  lateral tear  on the right which was repaired first followed by closure of the midline  incision.  Once this portion of the procedure had been completed, the ends  of the mesh were trimmed to drop beneath the skin at the incision sites.  Cystoscopy had been performed prior to closure of the vaginal vault.  This  demonstrated no evidence of bladder or urethral injury.   I then turned my attention to the [facility] procedure.  Two incisions were made  just above the pubis, one on the left, one on the right approximately 2 cm  lateral to the midline.  The fat was spread to the fascia with a hemostat.  I then infiltrated the vaginal wall mucosa overlying the mid urethral level  with lidocaine with 1% epinephrine, and an incision was made approximately 2  cm in length.  The mucosa was elevated off the pubourethral fascia lateral  on each side for approximately 2 cm to allow admission of the tip of a  finger.  I then passed the [facility] needles, first on the left.  The tip was  brought down to the top of the pubis, walked along the back the pubis bone,  and then brought out under finger guidance into the vaginal incision with  care being taken to push the urethra to the contralateral side.  This was  then repeated on the right.  [facility], The mesh was then snapped to the two  needles and pulled up into the abdominal incisions.  We did notice  relatively brisk venous bleeding from the right incision.  Despite  inspection with retractors on the right, were unable to visualize the source  of bleeding; however, it was easily controlled with compression.  Cystoscopy  was performed after the initial needle pass and upon mesh removal using the  70 and 12-degree lenses and the 22-French scope.  Examination revealed no  evidence of bladder or urethral injury.  At this point, the tension of the  mesh was finalized, and the sheaths were removed from the [facility] mesh.  The long ends were trimmed on the abdominal side  allowing the ends to fall back  into the subcutaneous space.  The anterior vaginal wall was then closed  using a running locked 2-0 Vicryl suture.  The Foley catheter had been  reinserted during the mesh tensioning process.  The vaginal vault was then  packed with two  2-inch Iodoform gauzes to ensure good compression because of  the bleeding from the [facility] incision.  I was pleased to see that there was  no blood tracking up out of the suprapubic incision on the right.  At this  point, the [facility] and Perigee incisions were then cleaned, dried, and closed  with Dermabond.  The Foley was placed to straight drainage.  The patient was  taken down from lithotomy position.  Her anesthetic was reversed.  She was  moved to the recovery room in stable condition.  There were no complications  during the procedure.      Excell Seltzer. Annabell Howells, M.D.  Electronically Signed     JJW/MEDQ  D:  06/28/2005  T:  06/28/2005  Job:  [id number]   cc:   Lesly Rubenstein, MD  [place], [place]

## 2011-03-11 NOTE — Op Note (Signed)
[facility]  Patient:    [name], [name] Visit Number: [id number] MRN: [id number]          Service Type: SUR Location: 3W 0356 02 Attending Physician:  Evlyn Clines Dictated by:   Excell Seltzer. Annabell Howells, M.D. Proc. Date: 04/09/02 Admit Date:  04/08/2002                             Operative Report  PROCEDURE:  Cystoscopy, right retrograde pyelogram with interpretation, insertion of right double J stent.  PREOPERATIVE DIAGNOSIS:  Right hydronephrosis with urinary tract infection, need to rule out stone.  POSTOPERATIVE DIAGNOSIS:  Right pyonephrosis.  SURGEON:  Excell Seltzer. Annabell Howells, M.D.  ANESTHESIA:  General.  DRAINS:  6 French 24 cm right double J stent.  COMPLICATIONS:  None.  INDICATIONS FOR PROCEDURE:  Ms. Drozdowski is a 55 year old white female with a four day history of irritative voiding symptoms, progressively severe right sided pain and fever who was found on CT scan in the ER to have right hydronephrosis without obvious stone and a urinary tract infection. It was felt that cystostent and possible urethroscopy were indicated.  FINDINGS AND PROCEDURE:  The patient was given Tequin preoperatively. She was taken to the operating room, general anesthetic was induced, she was placed in lithotomy position. Her perineum and genitalia were prepped with Betadine solution and she was draped in the usual sterile fashion. Cystoscopy was performed using a 22 [place] scope and the 12 and 70 degree lenses. Examination revealed a normal urethra. The bladder wall was smooth with patchy erythema consistent with hemorrhagic cystitis. The ureteral orifices were in the normal anatomic position. The right ureteral orifice was cannulated with a 5 [place] open end catheter, contrast was instilled in a retrograde fashion. There was still contrast in the collecting system from the CT scan. This contrast tapered down approximately 2 cm proximal to the UVJ. With  injection of retrograde contrast, there was a vague somewhat mobile filling defect in the distal ureter but an obvious stone could not be identified. A guidewire was passed up the open end catheter to the kidney and the open end catheter was backed out. Upon removal of the open end catheter leaving the wire in place, a plug of inspissated pus came out with the catheter and this was the likely source of obstruction. A 6 French 24 cm double J stent was then inserted under fluoroscopic guidance to the kidney over the wire. The wire was removed leaving a coil in the kidney, a good coil in the bladder. At this point, less turbid urine was then noted to efflux from the ureteral catheter. The bladder was then drained, the cystoscope was removed, the patient was taken down from lithotomy position, her anesthetic was reversed and she was removed to the recovery room in stable condition. There were no complications. Dictated by:   Excell Seltzer. Annabell Howells, M.D. Attending Physician:  Evlyn Clines DD:  04/09/02 TD:  04/10/02 Job: [id number]

## 2011-06-16 ENCOUNTER — Other Ambulatory Visit: Payer: Self-pay | Admitting: Orthopedic Surgery

## 2011-06-16 ENCOUNTER — Encounter (HOSPITAL_COMMUNITY): Payer: Medicare Other

## 2011-06-16 LAB — PROTIME-INR: Prothrombin Time: 15.8 seconds — ABNORMAL HIGH (ref 11.6–15.2)

## 2011-06-16 LAB — COMPREHENSIVE METABOLIC PANEL
AST: 17 U/L (ref 0–37)
Albumin: 3.6 g/dL (ref 3.5–5.2)
Alkaline Phosphatase: 84 U/L (ref 39–117)
BUN: 8 mg/dL (ref 6–23)
Chloride: 102 mEq/L (ref 96–112)
Potassium: 3.6 mEq/L (ref 3.5–5.1)
Total Bilirubin: 0.6 mg/dL (ref 0.3–1.2)

## 2011-06-16 LAB — URINALYSIS, ROUTINE W REFLEX MICROSCOPIC
Glucose, UA: NEGATIVE mg/dL
Hgb urine dipstick: NEGATIVE
Specific Gravity, Urine: 1.017 (ref 1.005–1.030)
pH: 6 (ref 5.0–8.0)

## 2011-06-16 LAB — CBC
HCT: 42.9 % (ref 36.0–46.0)
RDW: 15.4 % (ref 11.5–15.5)
WBC: 11 10*3/uL — ABNORMAL HIGH (ref 4.0–10.5)

## 2011-06-16 LAB — URINE MICROSCOPIC-ADD ON

## 2011-06-16 LAB — SURGICAL PCR SCREEN
MRSA, PCR: NEGATIVE
Staphylococcus aureus: NEGATIVE

## 2011-06-23 ENCOUNTER — Inpatient Hospital Stay (HOSPITAL_COMMUNITY)
Admission: RE | Admit: 2011-06-23 | Discharge: 2011-06-28 | DRG: 470 | Disposition: A | Discharge status: Home Health | Payer: Medicare Other | Source: Ambulatory Visit | Attending: Orthopedic Surgery | Admitting: Orthopedic Surgery

## 2011-06-23 DIAGNOSIS — R82998 Other abnormal findings in urine: Secondary | ICD-10-CM | POA: Diagnosis not present

## 2011-06-23 DIAGNOSIS — M1710 Unilateral primary osteoarthritis, unspecified knee: Principal | ICD-10-CM | POA: Diagnosis present

## 2011-06-23 DIAGNOSIS — E876 Hypokalemia: Secondary | ICD-10-CM | POA: Diagnosis not present

## 2011-06-23 DIAGNOSIS — Z981 Arthrodesis status: Secondary | ICD-10-CM

## 2011-06-23 DIAGNOSIS — F341 Dysthymic disorder: Secondary | ICD-10-CM | POA: Diagnosis present

## 2011-06-23 DIAGNOSIS — M549 Dorsalgia, unspecified: Secondary | ICD-10-CM | POA: Diagnosis present

## 2011-06-23 DIAGNOSIS — J45909 Unspecified asthma, uncomplicated: Secondary | ICD-10-CM | POA: Diagnosis present

## 2011-06-23 DIAGNOSIS — E785 Hyperlipidemia, unspecified: Secondary | ICD-10-CM | POA: Diagnosis present

## 2011-06-23 DIAGNOSIS — Z01812 Encounter for preprocedural laboratory examination: Secondary | ICD-10-CM

## 2011-06-23 DIAGNOSIS — Z7901 Long term (current) use of anticoagulants: Secondary | ICD-10-CM

## 2011-06-23 DIAGNOSIS — I2782 Chronic pulmonary embolism: Secondary | ICD-10-CM | POA: Diagnosis present

## 2011-06-23 DIAGNOSIS — G8929 Other chronic pain: Secondary | ICD-10-CM | POA: Diagnosis present

## 2011-06-23 DIAGNOSIS — I82509 Chronic embolism and thrombosis of unspecified deep veins of unspecified lower extremity: Secondary | ICD-10-CM | POA: Diagnosis present

## 2011-06-23 LAB — APTT: aPTT: 26 seconds (ref 24–37)

## 2011-06-23 LAB — PROTIME-INR: INR: 0.92 (ref 0.00–1.49)

## 2011-06-24 LAB — CBC
HCT: 37.2 % (ref 36.0–46.0)
RBC: 4.5 MIL/uL (ref 3.87–5.11)
RDW: 16 % — ABNORMAL HIGH (ref 11.5–15.5)
WBC: 10.9 10*3/uL — ABNORMAL HIGH (ref 4.0–10.5)

## 2011-06-24 LAB — BASIC METABOLIC PANEL
CO2: 25 mEq/L (ref 19–32)
Chloride: 101 mEq/L (ref 96–112)
GFR calc Af Amer: >60 mL/min (ref >60)
Potassium: 3.9 mEq/L (ref 3.5–5.1)

## 2011-06-24 LAB — PROTIME-INR
INR: 0.95 (ref 0.00–1.49)
Prothrombin Time: 12.9 seconds (ref 11.6–15.2)

## 2011-06-25 LAB — BASIC METABOLIC PANEL
CO2: 31 mEq/L (ref 19–32)
Calcium: 8.7 mg/dL (ref 8.4–10.5)
Chloride: 98 mEq/L (ref 96–112)
Potassium: 3.2 mEq/L — ABNORMAL LOW (ref 3.5–5.1)
Sodium: 137 mEq/L (ref 135–145)

## 2011-06-25 LAB — CBC
Platelets: 219 10*3/uL (ref 150–400)
RBC: 3.99 MIL/uL (ref 3.87–5.11)
WBC: 12.9 10*3/uL — ABNORMAL HIGH (ref 4.0–10.5)

## 2011-06-25 LAB — PROTIME-INR
INR: 1.06 (ref 0.00–1.49)
Prothrombin Time: 14 seconds (ref 11.6–15.2)

## 2011-06-26 LAB — CBC
Hemoglobin: 10.6 g/dL — ABNORMAL LOW (ref 12.0–15.0)
MCHC: 30.6 g/dL (ref 30.0–36.0)
RBC: 4.13 MIL/uL (ref 3.87–5.11)
WBC: 11.4 10*3/uL — ABNORMAL HIGH (ref 4.0–10.5)

## 2011-06-26 LAB — PROTIME-INR
INR: 1.32 (ref 0.00–1.49)
Prothrombin Time: 16.6 seconds — ABNORMAL HIGH (ref 11.6–15.2)

## 2011-06-27 ENCOUNTER — Inpatient Hospital Stay (HOSPITAL_COMMUNITY): Payer: Medicare Other

## 2011-06-27 LAB — URINALYSIS, ROUTINE W REFLEX MICROSCOPIC
Glucose, UA: NEGATIVE mg/dL
Ketones, ur: NEGATIVE mg/dL
Leukocytes, UA: NEGATIVE
Protein, ur: NEGATIVE mg/dL

## 2011-06-28 LAB — PROTIME-INR
INR: 2.15 — ABNORMAL HIGH (ref 0.00–1.49)
Prothrombin Time: 24.4 seconds — ABNORMAL HIGH (ref 11.6–15.2)

## 2011-06-29 NOTE — Discharge Summary (Signed)
Hannah, Montoya NO.:  [id number]  MEDICAL RECORD NO.:  [id number]  LOCATION:  1615                         FACILITY:  [facility]  PHYSICIAN:  Ollen Gross, M.D.    DATE OF BIRTH:  1956-09-21  DATE OF ADMISSION:  06/23/2011 DATE OF DISCHARGE:  06/28/2011                              DISCHARGE SUMMARY   ADMITTING DIAGNOSES: 1. Osteoarthritis, right knee. 2. History of migraines. 3. Tinnitus. 4. Allergic rhinitis. 5. Anxiety. 6. History of shingles. 7. Asthma. 8. Cardiac murmur. 9. Hyperlipidemia. 10.Varicose veins. 11.Hiatal hernia. 12.Reflux disease. 13.History of diverticulosis. 14.History of past urinary tract infections. 15.Past history of deep vein thrombosis and pulmonary embolism, March     2012. 16.Multilevel degenerative disk disease. 17.Postmenopausal. 18.Chronic back pain. 19.Childhood illnesses of measles and mumps.  DISCHARGE DIAGNOSES: 1. Osteoarthritis, right knee status post right total knee replacement     arthroplasty. 2. Postop hypokalemia. 3. Postop bacteriuria. 4. History of migraines. 5. Tinnitus. 6. Allergic rhinitis. 7. Anxiety. 8. History of shingles. 9. Asthma. 10.Cardiac murmur. 11.Hyperlipidemia. 12.Varicose veins. 13.Hiatal hernia. 14.Reflux disease. 15.History of diverticulosis. 16.History of past urinary tract infections. 17.Past history of deep vein thrombosis and pulmonary embolism, March     2012. 18.Multilevel degenerative disk disease. 19.Postmenopausal. 20.Chronic back pain. 21.Childhood illnesses of measles and mumps.  PROCEDURE:  June 23, 2011, right total knee surgery, Dr. Lequita Halt; assistant Avel Peace PA-C, anesthesia general.  Tourniquet time was only up for 2 minutes, it was not functional, so it was let down.  CONSULTS:  None.  BRIEF HISTORY:  The patient is a 55 year old female with advanced arthritis of her right knee with bone-on-bone, medial and patellofemoral compartments with  significant pain or dysfunction.  She has failed nonoperative management including injection and now presents for total knee arthroplasty.  LABORATORY DATA:  Preop CBC showed a hemoglobin of 13.6, hematocrit 42.9, white cell count 11, platelets 313.  PT/INR 15.8/1.23 with a PTT of 31.  Chem-panel on admission all within normal limits.  Preop UA; positive nitrites, moderate leukocyte esterase, 21-50 white cells, many bacteria.  This was treated preoperatively.  Blood group type, A negative.  Nasal swabs were negative, Staph aureus negative for MRSA. Serial CBCs were followed.  Hemoglobin dropped down from 11.7 to 10.4 back up and his last H and H was 10.6 and 34.6.  White count went up from 11-12.9 back down to 11.4.  Serial BMETs were followed.  It showed potassium did drop from 3.6 to 3.2, mild hypokalemia.  The patient was on potassium supplements normally, which were restarted postop. Remaining electrolytes remained within normal limits.  Followup UA on June 27, 2011:  Cloudy, positive nitrites, many bacteria, only 0-2 white cells.  X-RAYS: 1. Two-view, chest taken on June 27, 2011, no acute findings.     Lungs clear, no pleural fluid. 2. EKG dated November 05, 2009:  Sinus rhythm, normal P axis, left axis     deviation, consider anterior infarct, no significant change was     found, confirmed by Dr. Azalia Bilis.  HOSPITAL COURSE:  The patient admitted to [facility], taken to the OR, underwent the above-stated procedure without complication.  The patient tolerated procedure well, later  transferred to recovery room in the orthopedic floor.  She had moderate to severe pain through the night and some nausea.  She had a history of chronic pain and was on chronic pain medications at home.  We started her back on her Coumadin, consider on a Lovenox bridge due to the history of DVT and PE, was started back on all of her other home medications with chronic back pain  and degenerative disk disease.  We resumed the Opana and also encouraged early ambulation, which was quite slow to begin with.  By day 2, she was doing a little bit better, pain was under better control once she had her chronic pain medications.  She is only up walking about 4 or 5 feet at a time, short distances and continued over the long Labor Day holiday with therapy twice a day. She was slow to progress with her therapy, dressing change on day 2 and day 3.  Incision was healing well, no signs of infection.  She was still in a fair amount of pain requiring continued p.o. medications with IV backup, which delayed her release of some of her requiring IV narcotics.  By day 4, though she was up walking about 18, nearly 20 feet doing a little bit better, she was still slowly progressing.  She moved out to hospital beds, we had discharge planning get involved.  She wanted to go home after hospital stay, so they were making arrangements for hospital bed, no other equipments needed.  She was seen back on rounds on Tuesday 09/04.  She has had a rough night, but overall her pain was slowly getting better and she was slowlyprogressing physical therapy.  She had had a UA checked the day before because of some increased voiding.  It showed some bacteria and also, she had a chest x-ray checked, which was negative.  Lungs were clear. The patient did want to go home.  She was slowly improving, seen in rounds by Dr. Lequita Halt on Tuesday, the 4th and she was set up for discharge at that time.  DISCHARGE PLAN: 1. The patient will be discharged home today on June 28, 2011. 2. Discharge diagnoses, please see above. 3. Discharge medications:  OxyIR, Robaxin, Coumadin.  She is to     continue her home medications; albuterol, Crestor, diazepam,     furosemide, lidocaine patch, Midrin, Nasonex, omeprazole, Opana ER,     potassium chloride and polyethylene glycol/MiraLAX.  She is also     given  prescription for Cipro before discharge. 4. Diet:  Heart-healthy diet. 5. Activity:  She is weightbearing as tolerated, total knee protocol.     Home health PT. 6. Followup:  She is to follow up with Dr. Lequita Halt on the office in     about 2 weeks, either on Thursday the 13th or Friday the 14th.     Contact the office for an appointment.  DISPOSITION:  Plan is for home.  CONDITION UPON DISCHARGE:  Slowly improving.     Alexzandrew L. Julien Girt, P.A.C.   ______________________________ Ollen Gross, M.D.    ALP/MEDQ  D:  06/28/2011  T:  06/28/2011  Job:  [id number]  cc:   Thayer Headings, M.D. Fax: [phone]  Pamella Pert, MD Fax: [phone]  Electronically Signed by Patrica Duel P.A.C. on 06/29/2011 10:00:23 AM Electronically Signed by Ollen Gross M.D. on 06/29/2011 10:04:30 AM

## 2011-06-29 NOTE — Op Note (Signed)
NAMEMarland Kitchen  Montoya, [name] NO.:  [id number]  MEDICAL RECORD NO.:  [id number]  LOCATION:  1615                         FACILITY:  [facility]  PHYSICIAN:  Ollen Gross, M.D.    DATE OF BIRTH:  [birth year]  DATE OF PROCEDURE:  06/23/2011 DATE OF DISCHARGE:                              OPERATIVE REPORT   PREOPERATIVE DIAGNOSIS:  Osteoarthritis of right knee.  POSTOPERATIVE DIAGNOSIS:  Osteoarthritis of right knee.  PROCEDURE:  Right total knee arthroplasty.  SURGEON:  Ollen Gross, M.D.  ASSISTANT:  Alexzandrew L. Perkins, P.A.C.  ANESTHESIA:  General.  ESTIMATED BLOOD LOSS:  300.  DRAIN:  Hemovac x1.  TOURNIQUET TIME:  Tourniquet not utilized as it was not functional.  COMPLICATIONS:  None.  CONDITION:  Stable to recovery room.  BRIEF CLINICAL NOTE:  Hannah is a 55 year old female with advanced end- stage arthritis of her right knee, bone-on-bone, medial and patellofemoral compartments with significant pain and dysfunction.  She has failed nonoperative management including injections.  She presents now for right total knee arthroplasty.  PROCEDURE IN DETAIL:  After successful administration of general anesthetic, a tourniquet was placed on her right thigh and her right lower extremity prepped and draped in the usual sterile fashion. Extremity was wrapped in Esmarch, knee flexed and tourniquet inflated to 300 mmHg.  Midline incision was made with a 10 blade through subcutaneous tissue.  At this point, it was obvious that the tourniquet was not working, it was just a venous tourniquet.  We thus deflated the tourniquet into this procedure without it.  A fresh blade was used to make a medial parapatellar arthrotomy.  Soft tissue on proximal medial tibia was subperiosteally elevated to the joint line with the knife into the semimembranosus bursa with a Cobb elevator.  Soft tissue laterally was elevated with attention being paid to avoid any patellar tendon  on tibial tubercle.  The patella was everted, knee flexed to 90 degrees, and ACL and PCL removed.  Drill was used to create a starting hole in the distal femur, canal was thoroughly irrigated.  5 degree right valgus alignment guide was placed and distal femoral cutting blocks pinned to remove 11 mm of the distal femur.  The tibia was subluxed forward and the menisci removed.  Extramedullary tibial alignment guide was placed referencing proximally at the medial aspect of the tibial tubercle and distally along the second metatarsal axis and tibial crest.  Block was pinned to remove 2 mm off the more deficient medial side.  Tibial resection was made with an oscillating saw.  Size 2.5 was the most appropriate tibial component.  Given her morbid obesity, I decided to use  MBT revision tray.  We prepared with the drill and keel punch for the 2.5 MBT revision tray.  Femoral sizing guide was placed, size 3 was most appropriate.  Rotations marked off the epicondylar axis and confirmed by creating rectangular flexion gap at 90 degrees.  The block was pinned in this rotation and the anterior-posterior chamfer cuts were made.  Intercondylar blocks placed, back cuts made.  Trial size 3 posterior stabilized femur was placed.  A 12.5 mm posterior stabilized rotating platform insert trial was placed.  With the  12.5 full extension was achieved with excellent varus-valgus and anterior-posterior balance throughout full range of motion.  The patella was everted, thickness measured to be 23 mm. Freehand resection taken to 13 mm, 38 template was placed, lug holes were drilled, trial patella was placed and it tracks normally. Osteophytes removed off the posterior femur with the trial in place. All trials were removed and the cut bone surfaces were prepared with pulsatile lavage.  Cement was mixed and once ready for implantation, the size 2.5 MBT revision tray, size 3 posterior stabilized femur and 38 patella  were cemented into place and the patella was held with a clamp. Trial 12.5 mm insert was placed, knee held with full extension.  All extruded cement removed.  When the cement was fully hardened then the permanent 12.5 mm posterior stabilized rotating platform insert was placed in the tibial tray.  Wound was copiously irrigated with saline solution and the arthrotomy closed over Hemovac drain with interrupted #1 PDS.  Flexion against gravity was 135 degrees at which point her posterior calf and thigh were touching.  The subcu was then closed with interrupted 2-0 Vicryl and subcuticular running 4-0 Monocryl.  Catheter for the Marcaine pain pump was placed and the pump was initiated. Incision was cleaned and dried and Steri-Strips and bulky sterile dressing were applied.  She was then placed into a knee immobilizer, awakened and transported to recovery in stable condition.     Ollen Gross, M.D.     FA/MEDQ  D:  06/23/2011  T:  06/24/2011  Job:  [id number]  Electronically Signed by Ollen Gross M.D. on 06/29/2011 10:04:28 AM
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
## 2011-07-25 LAB — URINE MICROSCOPIC-ADD ON

## 2011-07-25 LAB — COMPREHENSIVE METABOLIC PANEL
Alkaline Phosphatase: 69
BUN: 11
Chloride: 105
GFR calc non Af Amer: >60
Glucose, Bld: 83
Potassium: 4.5
Total Bilirubin: 1.3 — ABNORMAL HIGH

## 2011-07-25 LAB — CBC
HCT: 37.6
Hemoglobin: 12.7
RDW: 15.8 — ABNORMAL HIGH
WBC: 10.3

## 2011-07-25 LAB — URINE CULTURE

## 2011-07-25 LAB — URINALYSIS, ROUTINE W REFLEX MICROSCOPIC
Bilirubin Urine: NEGATIVE
Nitrite: POSITIVE — AB
Specific Gravity, Urine: 1.016
Urobilinogen, UA: 0.2

## 2011-07-25 LAB — DIFFERENTIAL
Basophils Absolute: 0.1
Basophils Relative: 1
Neutro Abs: 6.5
Neutrophils Relative %: 63

## 2011-07-26 LAB — BASIC METABOLIC PANEL
BUN: 5 — ABNORMAL LOW
BUN: 6
CO2: 29
CO2: 30
Calcium: 9.9
Calcium: 9.2
Chloride: 102
Creatinine, Ser: 0.77
Creatinine, Ser: 0.6
Creatinine, Ser: 0.65
GFR calc Af Amer: >60
GFR calc non Af Amer: >60
Glucose, Bld: 102 — ABNORMAL HIGH
Potassium: 3.3 — ABNORMAL LOW
Sodium: 142

## 2011-07-26 LAB — PROTIME-INR
INR: 0.9
Prothrombin Time: 12.3

## 2011-07-26 LAB — CBC
HCT: 42.9
Hemoglobin: 12.7
MCHC: 32
MCHC: 33.1
MCV: 81.2
MCV: 82.2
Platelets: 305
Platelets: 277
Platelets: 317
RBC: 4.65
RDW: 16 — ABNORMAL HIGH
WBC: 13.7 — ABNORMAL HIGH

## 2011-07-26 LAB — TYPE AND SCREEN: ABO/RH(D): A NEG

## 2011-07-26 LAB — ABO/RH: ABO/RH(D): A NEG

## 2011-11-16 ENCOUNTER — Emergency Department (HOSPITAL_COMMUNITY): Payer: Medicare Other

## 2011-11-16 ENCOUNTER — Encounter (HOSPITAL_COMMUNITY): Payer: Self-pay | Admitting: Emergency Medicine

## 2011-11-16 ENCOUNTER — Other Ambulatory Visit: Payer: Self-pay

## 2011-11-16 ENCOUNTER — Observation Stay (HOSPITAL_COMMUNITY)
Admission: EM | Admit: 2011-11-16 | Discharge: 2011-11-18 | Disposition: A | Discharge status: Home / Self Care | Payer: Medicare Other | Source: Ambulatory Visit | Attending: Internal Medicine | Admitting: Internal Medicine

## 2011-11-16 DIAGNOSIS — R079 Chest pain, unspecified: Secondary | ICD-10-CM | POA: Diagnosis present

## 2011-11-16 DIAGNOSIS — E876 Hypokalemia: Secondary | ICD-10-CM | POA: Insufficient documentation

## 2011-11-16 DIAGNOSIS — R109 Unspecified abdominal pain: Secondary | ICD-10-CM | POA: Insufficient documentation

## 2011-11-16 DIAGNOSIS — G8929 Other chronic pain: Secondary | ICD-10-CM | POA: Insufficient documentation

## 2011-11-16 DIAGNOSIS — D649 Anemia, unspecified: Secondary | ICD-10-CM | POA: Insufficient documentation

## 2011-11-16 DIAGNOSIS — M7989 Other specified soft tissue disorders: Secondary | ICD-10-CM | POA: Insufficient documentation

## 2011-11-16 DIAGNOSIS — R0789 Other chest pain: Principal | ICD-10-CM | POA: Insufficient documentation

## 2011-11-16 DIAGNOSIS — E785 Hyperlipidemia, unspecified: Secondary | ICD-10-CM | POA: Diagnosis present

## 2011-11-16 DIAGNOSIS — Z86711 Personal history of pulmonary embolism: Secondary | ICD-10-CM

## 2011-11-16 DIAGNOSIS — R0602 Shortness of breath: Secondary | ICD-10-CM | POA: Insufficient documentation

## 2011-11-16 DIAGNOSIS — Z86718 Personal history of other venous thrombosis and embolism: Secondary | ICD-10-CM | POA: Insufficient documentation

## 2011-11-16 DIAGNOSIS — K219 Gastro-esophageal reflux disease without esophagitis: Secondary | ICD-10-CM | POA: Diagnosis present

## 2011-11-16 DIAGNOSIS — Z7901 Long term (current) use of anticoagulants: Secondary | ICD-10-CM | POA: Insufficient documentation

## 2011-11-16 DIAGNOSIS — J45909 Unspecified asthma, uncomplicated: Secondary | ICD-10-CM | POA: Insufficient documentation

## 2011-11-16 HISTORY — DX: Gastro-esophageal reflux disease without esophagitis: K21.9

## 2011-11-16 HISTORY — DX: Pure hypercholesterolemia, unspecified: E78.00

## 2011-11-16 HISTORY — DX: Other pulmonary embolism without acute cor pulmonale: I26.99

## 2011-11-16 LAB — PROTIME-INR
INR: 2.34 — ABNORMAL HIGH (ref 0.00–1.49)
Prothrombin Time: 26 seconds — ABNORMAL HIGH (ref 11.6–15.2)

## 2011-11-16 LAB — CBC
MCV: 74.5 fL — ABNORMAL LOW (ref 78.0–100.0)
Platelets: 344 10*3/uL (ref 150–400)
RBC: 5.56 MIL/uL — ABNORMAL HIGH (ref 3.87–5.11)
RDW: 17.4 % — ABNORMAL HIGH (ref 11.5–15.5)
WBC: 13.3 10*3/uL — ABNORMAL HIGH (ref 4.0–10.5)

## 2011-11-16 LAB — POCT I-STAT TROPONIN I: Troponin i, poc: 0 ng/mL (ref 0.00–0.08)

## 2011-11-16 LAB — BASIC METABOLIC PANEL
CO2: 27 mEq/L (ref 19–32)
Chloride: 100 mEq/L (ref 96–112)
Creatinine, Ser: 0.74 mg/dL (ref 0.50–1.10)
GFR calc Af Amer: >90 mL/min (ref >90)
Potassium: 3.1 mEq/L — ABNORMAL LOW (ref 3.5–5.1)

## 2011-11-16 LAB — D-DIMER, QUANTITATIVE: D-Dimer, Quant: 0.6 ug/mL-FEU — ABNORMAL HIGH (ref 0.00–0.48)

## 2011-11-16 MED ORDER — IOHEXOL 300 MG/ML  SOLN
100.0000 mL | Freq: Once | INTRAMUSCULAR | Status: AC | PRN
  Administered 2011-11-16: 100 mL via INTRAVENOUS

## 2011-11-16 MED ORDER — ONDANSETRON HCL 4 MG/2ML IJ SOLN
4.0000 mg | Freq: Once | INTRAMUSCULAR | Status: AC
  Administered 2011-11-16: 4 mg via INTRAVENOUS
  Filled 2011-11-16: qty 2

## 2011-11-16 MED ORDER — MORPHINE SULFATE 4 MG/ML IJ SOLN
4.0000 mg | Freq: Once | INTRAMUSCULAR | Status: AC
  Administered 2011-11-16: 4 mg via INTRAVENOUS
  Filled 2011-11-16: qty 1

## 2011-11-16 MED ORDER — NITROGLYCERIN 0.4 MG SL SUBL
0.4000 mg | SUBLINGUAL_TABLET | SUBLINGUAL | Status: DC | PRN
  Administered 2011-11-16: 0.4 mg via SUBLINGUAL
  Filled 2011-11-16: qty 25

## 2011-11-16 NOTE — ED Notes (Signed)
PT. TRANSPORTED TO CT SCAN. 

## 2011-11-16 NOTE — ED Provider Notes (Signed)
History     CSN: [id number]  Arrival date & time 11/16/11  4098   First MD Initiated Contact with Patient 11/16/11 1904      Chief Complaint  Patient presents with  . Chest Pain    (Consider location/radiation/quality/duration/timing/severity/associated sxs/prior treatment) Patient is a 56 y.o. female presenting with chest pain. The history is provided by the patient.  Chest Pain The chest pain began 3 - 5 hours ago. Chest pain occurs constantly. The chest pain is improving. Associated with: nothing. The severity of the pain is moderate. The quality of the pain is described as pressure-like. The pain radiates to the right shoulder. Primary symptoms include shortness of breath, abdominal pain, nausea and dizziness. Pertinent negatives for primary symptoms include no fever, no cough, no wheezing, no palpitations and no vomiting.  Dizziness also occurs with nausea and diaphoresis. Dizziness does not occur with vomiting.  Associated symptoms include diaphoresis and lower extremity edema. She tried aspirin for the symptoms.   Pt states she was playing a video game when she suddenly began to feel dizzy, nauseated, diaphoretic and developed a retrosternal chest pressure, radiating to right shoulder. States no history of the same. Hx of PE, asthma. Reports CP is improving but still there, about 3/10. Denies fever, chills, vomiting, diarrhea, back pain.   Past Medical History  Diagnosis Date  . Asthma   . Pulmonary embolism   . High cholesterol   . Acid reflux     Past Surgical History  Procedure Date  . Replacement total knee   . Cholecystectomy   . Abdominal hysterectomy   . Back surgery     No family history on file.  History  Substance Use Topics  . Smoking status: Never Smoker   . Smokeless tobacco: Not on file  . Alcohol Use: No    OB History    Grav Para Term Preterm Abortions TAB SAB Ect Mult Living                  Review of Systems  Constitutional: Positive for  diaphoresis. Negative for fever.  HENT: Negative.   Eyes: Negative.   Respiratory: Positive for chest tightness and shortness of breath. Negative for cough and wheezing.   Cardiovascular: Positive for chest pain and leg swelling. Negative for palpitations.  Gastrointestinal: Positive for nausea and abdominal pain. Negative for vomiting.  Genitourinary: Negative.   Musculoskeletal: Negative.   Skin: Negative.   Neurological: Positive for dizziness and light-headedness.  Psychiatric/Behavioral: Negative.     Allergies  Review of patient's allergies indicates not on file.  Home Medications  No current outpatient prescriptions on file.  BP 141/68  Pulse 87  Temp(Src) 97.8 F (36.6 C) (Oral)  Resp 16  SpO2 100%  Physical Exam  Nursing note and vitals reviewed. Constitutional: She is oriented to person, place, and time. She appears well-developed and well-nourished.       Uncomfortable appearing  HENT:  Head: Normocephalic and atraumatic.  Eyes: Conjunctivae are normal.  Neck: Neck supple.  Cardiovascular: Normal rate, regular rhythm and normal heart sounds.   Pulmonary/Chest: Effort normal and breath sounds normal. No respiratory distress. She has no wheezes.  Abdominal: Soft. Bowel sounds are normal. She exhibits no distension. There is no tenderness.  Musculoskeletal: Normal range of motion.       LE swelling bilaterally, no calf tenderness to palpation. Right leg more swollen then left  Neurological: She is alert and oriented to person, place, and time.  Skin: Skin  is warm and dry. No erythema.  Psychiatric: She has a normal mood and affect.    ED Course  Procedures (including critical care time)  Pt with CP, hx of PE. Will get ECG, CXR, enzymes, will monitor.   Results for orders placed during the hospital encounter of 11/16/11  CBC      Component Value Range   WBC 13.3 (*) 4.0 - 10.5 (K/uL)   RBC 5.56 (*) 3.87 - 5.11 (MIL/uL)   Hemoglobin 12.8  12.0 - 15.0 (g/dL)    HCT 62.1  30.8 - 65.7 (%)   MCV 74.5 (*) 78.0 - 100.0 (fL)   MCH 23.0 (*) 26.0 - 34.0 (pg)   MCHC 30.9  30.0 - 36.0 (g/dL)   RDW 84.6 (*) 96.2 - 15.5 (%)   Platelets 344  150 - 400 (K/uL)  BASIC METABOLIC PANEL      Component Value Range   Sodium 143  135 - 145 (mEq/L)   Potassium 3.1 (*) 3.5 - 5.1 (mEq/L)   Chloride 100  96 - 112 (mEq/L)   CO2 27  19 - 32 (mEq/L)   Glucose, Bld 106 (*) 70 - 99 (mg/dL)   BUN 11  6 - 23 (mg/dL)   Creatinine, Ser 9.52  0.50 - 1.10 (mg/dL)   Calcium 9.9  8.4 - 84.1 (mg/dL)   GFR calc non Af Amer >90  >90 (mL/min)   GFR calc Af Amer >90  >90 (mL/min)  D-DIMER, QUANTITATIVE      Component Value Range   D-Dimer, Quant 0.60 (*) 0.00 - 0.48 (ug/mL-FEU)  PROTIME-INR      Component Value Range   Prothrombin Time 26.0 (*) 11.6 - 15.2 (seconds)   INR 2.34 (*) 0.00 - 1.49   POCT I-STAT TROPONIN I      Component Value Range   Troponin i, poc 0.00  0.00 - 0.08 (ng/mL)   Comment 3            Dg Chest 2 View  11/16/2011  *RADIOLOGY REPORT*  Clinical Data: Left-sided chest pain.  CHEST - 2 VIEW  Comparison: Two-view chest 06/27/2011.  Findings: The heart size is normal.  The left hemidiaphragm is slightly elevated.  The lungs are clear.  Degenerative changes in the thoracic spine are stable.  IMPRESSION:  1.  Slight elevation of the left hemidiaphragm.  This could represent splinting associated with the left-sided pain.  No focal abnormality is evident otherwise. 2.  Stable degenerative changes in the thoracic spine.  Original Report Authenticated By: Jamesetta Orleans. MATTERN, M.D.   Ct Angio Chest W/cm &/or Wo Cm  11/16/2011  *RADIOLOGY REPORT*  Clinical Data: Pain and shortness of breath.  History of pulmonary embolus.  CT ANGIOGRAPHY CHEST  Technique:  Multidetector CT imaging of the chest using the standard protocol during bolus administration of intravenous contrast. Multiplanar reconstructed images including MIPs were obtained and reviewed to evaluate the  vascular anatomy.  Contrast: OMNIPAQUE IOHEXOL 300 MG/ML IV SOLN  Comparison: Plain films of the chest earlier this same day and 06/27/2011.  CT chest 11/05/2009.  Findings: No pulmonary embolus is identified.  There is no pleural or pericardial effusion.  Heart size is normal.  No axillary, hilar or mediastinal lymphadenopathy.  The lungs demonstrate some dependent atelectasis.  Incidentally imaged upper abdomen is unremarkable.  No focal bony abnormality.  IMPRESSION: Negative for pulmonary embolus.  No acute finding.  Original Report Authenticated By: Bernadene Bell. D'ALESSIO, M.D.    CT angio  negative for pe. Pt continues to have pain. Will admit  Spoke with [facility] will admit for further treatment.   1. Chest pain     Medical screening examination/treatment/procedure(s) were conducted as a shared visit with non-physician practitioner(s) and myself.  I personally evaluated the patient during the encounter 56 year old lady with history of pulmonary embolism, on coumadin, presents with chest pain radiating to right shoulder.  Exam shows heart sounds normal, lungs clear.  EKG non-acute, TNI negative, CT angio of chest shows no PE.  Admitted for chest pain workup. Carleene Cooper III, M.d.     Lottie Mussel, PA 11/18/11 4098  Carleene Cooper III, MD 11/18/11 [phone]

## 2011-11-16 NOTE — ED Notes (Signed)
Pt c/o left chest pain onset approx 1600 today while playing a game on computer.  Pt st's she became diaphoretic then had a chill.  Also st's she had nausea at onset of pain but now has subsided. Pt st's she took a ASA  But the ASA had expired on 3/11.

## 2011-11-16 NOTE — ED Notes (Signed)
CALLED FLOOR TO GIVE REPORT , NURSE NOT READY AT THIS TIME.

## 2011-11-17 ENCOUNTER — Other Ambulatory Visit: Payer: Self-pay

## 2011-11-17 LAB — CBC
HCT: 34.4 % — ABNORMAL LOW (ref 36.0–46.0)
Hemoglobin: 10.5 g/dL — ABNORMAL LOW (ref 12.0–15.0)
MCH: 22.9 pg — ABNORMAL LOW (ref 26.0–34.0)
MCHC: 30.5 g/dL (ref 30.0–36.0)
MCV: 75.1 fL — ABNORMAL LOW (ref 78.0–100.0)
Platelets: 292 K/uL (ref 150–400)
RBC: 4.58 MIL/uL (ref 3.87–5.11)
RDW: 17.7 % — ABNORMAL HIGH (ref 11.5–15.5)
WBC: 10 K/uL (ref 4.0–10.5)

## 2011-11-17 LAB — BASIC METABOLIC PANEL WITH GFR
BUN: 11 mg/dL (ref 6–23)
CO2: 29 meq/L (ref 19–32)
Calcium: 9 mg/dL (ref 8.4–10.5)
Chloride: 104 meq/L (ref 96–112)
Creatinine, Ser: 0.7 mg/dL (ref 0.50–1.10)
GFR calc Af Amer: >90 mL/min
GFR calc non Af Amer: >90 mL/min
Glucose, Bld: 112 mg/dL — ABNORMAL HIGH (ref 70–99)
Potassium: 3.7 meq/L (ref 3.5–5.1)
Sodium: 142 meq/L (ref 135–145)

## 2011-11-17 LAB — CARDIAC PANEL(CRET KIN+CKTOT+MB+TROPI)
CK, MB: 2.7 ng/mL (ref 0.3–4.0)
CK, MB: 2.8 ng/mL (ref 0.3–4.0)
Relative Index: 2 (ref 0.0–2.5)
Relative Index: 2.2 (ref 0.0–2.5)
Total CK: 135 U/L (ref 7–177)
Total CK: 128 U/L (ref 7–177)
Troponin I: <0.3 ng/mL
Troponin I: <0.3 ng/mL

## 2011-11-17 LAB — PROTIME-INR: INR: 2.93 — ABNORMAL HIGH (ref 0.00–1.49)

## 2011-11-17 MED ORDER — PANTOPRAZOLE SODIUM 40 MG PO TBEC
40.0000 mg | DELAYED_RELEASE_TABLET | Freq: Every day | ORAL | Status: DC
Start: 2011-11-17 — End: 2011-11-18
  Administered 2011-11-17 – 2011-11-18 (×2): 40 mg via ORAL
  Filled 2011-11-17 (×2): qty 1

## 2011-11-17 MED ORDER — WARFARIN SODIUM 2.5 MG PO TABS
2.5000 mg | ORAL_TABLET | ORAL | Status: DC
  Administered 2011-11-17: 2.5 mg via ORAL
  Filled 2011-11-17: qty 1

## 2011-11-17 MED ORDER — MORPHINE SULFATE 30 MG PO TABS
30.0000 mg | ORAL_TABLET | Freq: Three times a day (TID) | ORAL | Status: DC | PRN
  Administered 2011-11-17 – 2011-11-18 (×3): 30 mg via ORAL
  Filled 2011-11-17 (×3): qty 1

## 2011-11-17 MED ORDER — MORPHINE SULFATE 30 MG PO TABS
30.0000 mg | ORAL_TABLET | Freq: Three times a day (TID) | ORAL | Status: DC | PRN
  Administered 2011-11-17: 30 mg via ORAL
  Filled 2011-11-17: qty 1

## 2011-11-17 MED ORDER — SODIUM CHLORIDE 0.9 % IJ SOLN
3.0000 mL | Freq: Two times a day (BID) | INTRAMUSCULAR | Status: DC
  Administered 2011-11-17 – 2011-11-18 (×4): 3 mL via INTRAVENOUS

## 2011-11-17 MED ORDER — WARFARIN SODIUM 5 MG PO TABS
5.0000 mg | ORAL_TABLET | ORAL | Status: DC
  Filled 2011-11-17: qty 1

## 2011-11-17 MED ORDER — HYDROMORPHONE HCL PF 1 MG/ML IJ SOLN
1.0000 mg | INTRAMUSCULAR | Status: DC | PRN
  Administered 2011-11-17 (×2): 1 mg via INTRAVENOUS
  Filled 2011-11-17 (×2): qty 1

## 2011-11-17 MED ORDER — SODIUM CHLORIDE 0.9 % IV SOLN: 250.0000 mL | INTRAVENOUS | Status: DC | PRN

## 2011-11-17 MED ORDER — FUROSEMIDE 40 MG PO TABS
40.0000 mg | ORAL_TABLET | Freq: Two times a day (BID) | ORAL | Status: DC
  Administered 2011-11-17 – 2011-11-18 (×3): 40 mg via ORAL
  Filled 2011-11-17 (×5): qty 1

## 2011-11-17 MED ORDER — POTASSIUM CHLORIDE 10 MEQ/100ML IV SOLN
10.0000 meq | INTRAVENOUS | Status: AC
  Administered 2011-11-17 (×2): 10 meq via INTRAVENOUS
  Filled 2011-11-17 (×2): qty 100

## 2011-11-17 MED ORDER — DIAZEPAM 5 MG PO TABS
10.0000 mg | ORAL_TABLET | Freq: Two times a day (BID) | ORAL | Status: DC | PRN
Start: 2011-11-17 — End: 2011-11-18
  Administered 2011-11-17: 10 mg via ORAL
  Filled 2011-11-17 (×2): qty 1

## 2011-11-17 MED ORDER — POTASSIUM CHLORIDE CRYS ER 20 MEQ PO TBCR: 40.0000 meq | EXTENDED_RELEASE_TABLET | Freq: Every day | ORAL | Status: DC

## 2011-11-17 MED ORDER — ONDANSETRON HCL 4 MG/2ML IJ SOLN
4.0000 mg | Freq: Four times a day (QID) | INTRAMUSCULAR | Status: DC | PRN
  Administered 2011-11-17: 4 mg via INTRAVENOUS
  Filled 2011-11-17: qty 2

## 2011-11-17 MED ORDER — LIDOCAINE 5 % EX PTCH
1.0000 | MEDICATED_PATCH | CUTANEOUS | Status: DC
  Administered 2011-11-17 – 2011-11-18 (×2): 1 via TRANSDERMAL
  Filled 2011-11-17 (×2): qty 1

## 2011-11-17 MED ORDER — ONDANSETRON HCL 4 MG PO TABS: 4.0000 mg | ORAL_TABLET | Freq: Four times a day (QID) | ORAL | Status: DC | PRN

## 2011-11-17 MED ORDER — POTASSIUM CHLORIDE CRYS ER 20 MEQ PO TBCR
20.0000 meq | EXTENDED_RELEASE_TABLET | Freq: Every day | ORAL | Status: DC
  Administered 2011-11-17 – 2011-11-18 (×2): 20 meq via ORAL
  Filled 2011-11-17 (×3): qty 1

## 2011-11-17 MED ORDER — ASPIRIN EC 325 MG PO TBEC
325.0000 mg | DELAYED_RELEASE_TABLET | Freq: Every day | ORAL | Status: DC
  Administered 2011-11-17: 325 mg via ORAL
  Filled 2011-11-17: qty 1

## 2011-11-17 MED ORDER — OXYCODONE HCL 5 MG PO TABS
5.0000 mg | ORAL_TABLET | ORAL | Status: DC | PRN
  Administered 2011-11-17: 5 mg via ORAL
  Filled 2011-11-17: qty 1

## 2011-11-17 MED ORDER — ROSUVASTATIN CALCIUM 20 MG PO TABS
20.0000 mg | ORAL_TABLET | Freq: Every day | ORAL | Status: DC
  Administered 2011-11-17 – 2011-11-18 (×2): 20 mg via ORAL
  Filled 2011-11-17 (×2): qty 1

## 2011-11-17 MED ORDER — ASPIRIN EC 81 MG PO TBEC
81.0000 mg | DELAYED_RELEASE_TABLET | Freq: Every day | ORAL | Status: DC
  Administered 2011-11-18: 81 mg via ORAL
  Filled 2011-11-17: qty 1

## 2011-11-17 MED ORDER — SODIUM CHLORIDE 0.9 % IJ SOLN: 3.0000 mL | INTRAMUSCULAR | Status: DC | PRN

## 2011-11-17 MED ORDER — ISOMETHEPTENE-APAP-DICHLORAL 65-325-100 MG PO CAPS
1.0000 | ORAL_CAPSULE | ORAL | Status: DC | PRN
  Administered 2011-11-17 – 2011-11-18 (×3): 1 via ORAL
  Filled 2011-11-17 (×3): qty 1

## 2011-11-17 NOTE — Progress Notes (Signed)
Subjective: 56 year old female with history of recurrent venous thromboembolism, on Coumadin, gastroesophageal reflux disease, asthma, hyperlipidemia, prior negative stress test in July of last year, strong family history of coronary artery disease who started experiencing precordial chest pressure at rest at approximately 4 PM yesterday. This discomfort radiated to the right upper extremity. It was associated with sweating and mild dyspnea. She took a packet of aspirin and pain eventually resolved after an hour. She says this pain was not similar to her GERD pain. Since being in the hospital she has had intermittent milder episodes of similar pain.  Objective: Blood pressure 104/70, pulse 78, temperature 97.8 F (36.6 C), temperature source Oral, resp. rate 20, height 5\' 2"  (1.575 m), weight 100.2 kg (220 lb 14.4 oz), SpO2 95.00%.  Intake/Output Summary (Last 24 hours) at 11/17/11 1232 Last data filed at 11/17/11 0122  Gross per 24 hour  Intake      3 ml  Output      0 ml  Net      3 ml    General Exam: Comfortable. Overweight Respiratory System: Clear. No increased work of breathing. No chest wall tenderness. Cardiovascular System: First and second heart sounds heard. Regular rate and rhythm. No JVD/murmurs. Gastrointestinal System: Abdomen is non distended, soft and normal bowel sounds heard. Central Nervous System: Alert and oriented. No focal neurological deficits.  Lab Results: Basic Metabolic Panel:  Basename 11/17/11 0615 11/16/11 1916  NA 142 143  K 3.7 3.1*  CL 104 100  CO2 29 27  GLUCOSE 112* 106*  BUN 11 11  CREATININE 0.70 0.74  CALCIUM 9.0 9.9  MG -- --  PHOS -- --   Liver Function Tests: No results found for this basename: AST:2,ALT:2,ALKPHOS:2,BILITOT:2,PROT:2,ALBUMIN:2 in the last 72 hours No results found for this basename: LIPASE:2,AMYLASE:2 in the last 72 hours No results found for this basename: AMMONIA:2 in the last 72 hours CBC:  Basename 11/17/11 0615  11/16/11 1916  WBC 10.0 13.3*  NEUTROABS -- --  HGB 10.5* 12.8  HCT 34.4* 41.4  MCV 75.1* 74.5*  PLT 292 344   Cardiac Enzymes:  Basename 11/17/11 0850  CKTOTAL PENDING  CKMB 2.7  CKMBINDEX --  TROPONINI <0.30   BNP: No results found for this basename: PROBNP:3 in the last 72 hours D-Dimer:  Alvira Philips 11/16/11 1928  DDIMER 0.60*   CBG: No results found for this basename: GLUCAP:6 in the last 72 hours Hemoglobin A1C: No results found for this basename: HGBA1C in the last 72 hours Fasting Lipid Panel: No results found for this basename: CHOL,HDL,LDLCALC,TRIG,CHOLHDL,LDLDIRECT in the last 72 hours Thyroid Function Tests: No results found for this basename: TSH,T4TOTAL,FREET4,T3FREE,THYROIDAB in the last 72 hours Anemia Panel: No results found for this basename: VITAMINB12,FOLATE,FERRITIN,TIBC,IRON,RETICCTPCT in the last 72 hours Coagulation:  Basename 11/17/11 0615 11/16/11 1928  LABPROT 31.0* 26.0*  INR 2.93* 2.34*   Urine Drug Screen: Drugs of Abuse  No results found for this basename: labopia,  cocainscrnur,  labbenz,  amphetmu,  thcu,  labbarb    Alcohol Level: No results found for this basename: ETH:2 in the last 72 hours  Micro Results: No results found for this or any previous visit (from the past 240 hour(s)).  Studies/Results: Dg Chest 2 View  11/16/2011  *RADIOLOGY REPORT*  Clinical Data: Left-sided chest pain.  CHEST - 2 VIEW  Comparison: Two-view chest 06/27/2011.  Findings: The heart size is normal.  The left hemidiaphragm is slightly elevated.  The lungs are clear.  Degenerative changes in the  thoracic spine are stable.  IMPRESSION:  1.  Slight elevation of the left hemidiaphragm.  This could represent splinting associated with the left-sided pain.  No focal abnormality is evident otherwise. 2.  Stable degenerative changes in the thoracic spine.  Original Report Authenticated By: Jamesetta Orleans. MATTERN, M.D.   Ct Angio Chest W/cm &/or Wo Cm  11/16/2011   *RADIOLOGY REPORT*  Clinical Data: Pain and shortness of breath.  History of pulmonary embolus.  CT ANGIOGRAPHY CHEST  Technique:  Multidetector CT imaging of the chest using the standard protocol during bolus administration of intravenous contrast. Multiplanar reconstructed images including MIPs were obtained and reviewed to evaluate the vascular anatomy.  Contrast: OMNIPAQUE IOHEXOL 300 MG/ML IV SOLN  Comparison: Plain films of the chest earlier this same day and 06/27/2011.  CT chest 11/05/2009.  Findings: No pulmonary embolus is identified.  There is no pleural or pericardial effusion.  Heart size is normal.  No axillary, hilar or mediastinal lymphadenopathy.  The lungs demonstrate some dependent atelectasis.  Incidentally imaged upper abdomen is unremarkable.  No focal bony abnormality.  IMPRESSION: Negative for pulmonary embolus.  No acute finding.  Original Report Authenticated By: Bernadene Bell. Maricela Curet, M.D.    Medications: Scheduled Meds:    . aspirin EC  325 mg Oral Daily  . furosemide  40 mg Oral BID  . lidocaine  1 patch Transdermal Q24H  . morphine  4 mg Intravenous Once  . ondansetron  4 mg Intravenous Once  . pantoprazole  40 mg Oral Q1200  . potassium chloride  10 mEq Intravenous Q1 Hr x 2  . potassium chloride  20 mEq Oral Daily  . rosuvastatin  20 mg Oral Daily  . sodium chloride  3 mL Intravenous Q12H  . warfarin  2.5 mg Oral Custom  . warfarin  5 mg Oral Custom  . DISCONTD: potassium chloride  40 mEq Oral Daily   Continuous Infusions:  PRN Meds:.sodium chloride, diazepam, HYDROmorphone, iohexol, morphine, ondansetron (ZOFRAN) IV, ondansetron, oxyCODONE, sodium chloride, DISCONTD: nitroGLYCERIN  Assessment/Plan: 1. Chest pain with some typical and some atypical features for angina, in patient with risk factors for coronary artery disease including obesity, hyperlipidemia and strong family history. Cardiology/Dr. Jacinto Halim has been consulted and advised [place] to keep patient  n.p.o. so that he can evaluate and may even consider catheterization. Currently asymptomatic of any pain. Continue aspirin. CT angiogram of chest was negative for new pulmonary embolism. 2. History of recurrent venous thromboembolism: Therapeutically anticoagulated. Continue Coumadin. 3. Asthma, stable 4. Gastroesophageal reflux disease: Continue proton pump inhibitors 5. Hyperlipidemia  6. Anemia: Follow CBCs tomorrow. 7. Hypokalemia: Repleted.   HONGALGI,ANAND 11/17/2011, 12:32 PM

## 2011-11-17 NOTE — Progress Notes (Signed)
11/17/2011 [facility], Bosie Clos SPARKS Case Management Note [phone]    CARE MANAGEMENT NOTE 11/17/2011  Patient:  KEMYA, SHED   Account Number:  [id number]  Date Initiated:  11/17/2011  Documentation initiated by:  Fransico Michael  Subjective/Objective Assessment:   admitted on 11/16/11 with left sided chest pain.     Action/Plan:   prior to admission, patient lived at home with family support. Independent with ADLs.   Anticipated DC Date:  11/20/2011   Anticipated DC Plan:  HOME/SELF CARE      DC Planning Services  CM consult      Choice offered to / List presented to:             Status of service:  In process, will continue to follow Medicare Important Message given?   (If response is "NO", the following Medicare IM given date fields will be blank) Date Medicare IM given:   Date Additional Medicare IM given:    Discharge Disposition:    Per UR Regulation:  Reviewed for med. necessity/level of care/duration of stay  Comments:  11/17/11- [id number].Lutricia Horsfall [phone]      56yo female admitted on 11/16/11 with c/o left sided chest pain. History is significant for asthma and pulmonary embolus. Prior to admission, patient lived at home with family support. Independent with ADLs. In to speak with family. Reports having a PCP of Shary Decamp. Also requests help with medications. Pharmacy called. Patient is elligible for the zz fund. CM will continue to monitor for discharge needs.

## 2011-11-17 NOTE — H&P (Signed)
PCP: None listed    Chief Complaint: Left-sided chest pain.   HPI: [name] is an 56 y.o. female with history of pulmonary embolism twice, status post cholecystectomy, GERD symptomology, asthma, hyperlipidemia, status post right knee surgery, present to the emergency room with left-sided chest pain. She stated it started suddenly this morning, accompanied by some nausea and diaphoresis, with pain initially started on the left side, radiating to her right shoulder. She denied any black stool or bloody stool, and has not had exertional chest pain before. Workup in the emergency room included a negative CT pulmonary angiogram, unremarkable EKG, a therapeutic INR of 2.3, normal white count, and hemoglobin of 12.8 g per decaliter. Hospitalist was asked to admit her for rule out.  Rewiew of Systems:  The patient denies anorexia, fever, weight loss,, vision loss, decreased hearing, hoarseness,  syncope, dyspnea on exertion, peripheral edema, balance deficits, hemoptysis, abdominal pain, melena, hematochezia, severe indigestion/heartburn, hematuria, incontinence, genital sores, muscle weakness, suspicious skin lesions, transient blindness, difficulty walking, depression, unusual weight change, abnormal bleeding, enlarged lymph nodes, angioedema, and breast masses.    Past Medical History  Diagnosis Date  . Asthma   . Pulmonary embolism   . High cholesterol   . Acid reflux     Past Surgical History  Procedure Date  . Replacement total knee   . Cholecystectomy   . Abdominal hysterectomy   . Back surgery     Medications:  HOME MEDS: Prior to Admission medications   Medication Sig Start Date End Date Taking? Authorizing Provider  cyclobenzaprine (FLEXERIL) 10 MG tablet Take 10 mg by mouth 3 (three) times daily as needed. For muscle spasms   Yes Historical Provider, MD  diazepam (VALIUM) 10 MG tablet Take 10 mg by mouth every 12 (twelve) hours as needed. For muscle spasms   Yes  Historical Provider, MD  diclofenac sodium (VOLTAREN) 1 % GEL Apply 1 application topically 4 (four) times daily. To back and legs   Yes Historical Provider, MD  diphenhydrAMINE (BENADRYL) 25 mg capsule Take 25 mg by mouth every 6 (six) hours as needed. For allergies   Yes Historical Provider, MD  furosemide (LASIX) 40 MG tablet Take 40 mg by mouth 2 (two) times daily.   Yes Historical Provider, MD  ibuprofen (ADVIL,MOTRIN) 200 MG tablet Take 400 mg by mouth every 6 (six) hours as needed. For pain   Yes Historical Provider, MD  lidocaine (LIDODERM) 5 % Place 1 patch onto the skin daily. Remove & Discard patch within 12 hours or as directed by MD   Yes Historical Provider, MD  morphine (MSIR) 30 MG tablet Take 30 mg by mouth every 8 (eight) hours as needed. For pain   Yes Historical Provider, MD  omeprazole (PRILOSEC) 40 MG capsule Take 40 mg by mouth daily.   Yes Historical Provider, MD  OXAPROZIN PO Take 600 mg by mouth 2 (two) times daily as needed. For arthritis   Yes Historical Provider, MD  oxycodone (OXY-IR) 5 MG capsule Take 5 mg by mouth every 4 (four) hours as needed. For pain   Yes Historical Provider, MD  oxyCODONE-acetaminophen (PERCOCET) 10-325 MG per tablet Take 1 tablet by mouth every 4 (four) hours as needed. For pain   Yes Historical Provider, MD  promethazine (PHENERGAN) 25 MG tablet Take 25 mg by mouth every 6 (six) hours as needed. For nausea   Yes Historical Provider, MD  rosuvastatin (CRESTOR) 20 MG tablet Take 20 mg by mouth daily.  Yes Historical Provider, MD  warfarin (COUMADIN) 5 MG tablet Take 2.5-5 mg by mouth daily. Take 2.5 mg daily except for mondays and Friday take 5 mg   Yes Historical Provider, MD     Allergies:  Allergies  Allergen Reactions  . Shellfish Allergy Hives and Nausea And Vomiting    Social History:   reports that she has never smoked. She does not have any smokeless tobacco history on file. She reports that she does not drink alcohol or use  illicit drugs.  Family History: No family history on file.   Physical Exam: Filed Vitals:   11/16/11 2024 11/16/11 2224 11/16/11 2335 11/17/11 0027  BP: 133/82 132/84 108/68 132/93  Pulse: 91 92  85  Temp:    97.7 F (36.5 C)  TempSrc:  Oral  Oral  Resp: 14  16 18   Height:    5\' 2"  (1.575 m)  Weight:    98.9 kg (218 lb 0.6 oz)  SpO2: 100% 99% 99% 99%   Blood pressure 132/93, pulse 85, temperature 97.7 F (36.5 C), temperature source Oral, resp. rate 18, height 5\' 2"  (1.575 m), weight 98.9 kg (218 lb 0.6 oz), SpO2 99.00%.  GEN:  Pleasant person lying in the stretcher in no acute distress; cooperative with exam PSYCH:  alert and oriented x4; does not appear anxious does not appear depressed; affect is normal HEENT: Mucous membranes pink and anicteric; PERRLA; EOM intact; no cervical lymphadenopathy nor thyromegaly or carotid bruit; no JVD; Breasts:: Not examined CHEST WALL: No tenderness CHEST: Normal respiration, clear to auscultation bilaterally HEART: Regular rate and rhythm; no murmurs rubs or gallops BACK: No kyphosis or scoliosis; no CVA tenderness ABDOMEN: Obese, soft non-tender; no masses, no organomegaly, normal abdominal bowel sounds; no pannus; no intertriginous candida. Rectal Exam: Not done EXTREMITIES: No bone or joint deformity; age-appropriate arthropathy of the hands and knees; no edema; no ulcerations. Genitalia: not examined PULSES: 2+ and symmetric SKIN: Normal hydration no rash or ulceration CNS: Cranial nerves 2-12 grossly intact no focal neurologic deficit   Labs & Imaging Results for orders placed during the hospital encounter of 11/16/11 (from the past 48 hour(s))  CBC     Status: Abnormal   Collection Time   11/16/11  7:16 PM      Component Value Range Comment   WBC 13.3 (*) 4.0 - 10.5 (K/uL)    RBC 5.56 (*) 3.87 - 5.11 (MIL/uL)    Hemoglobin 12.8  12.0 - 15.0 (g/dL)    HCT 16.1  09.6 - 04.5 (%)    MCV 74.5 (*) 78.0 - 100.0 (fL)    MCH 23.0 (*)  26.0 - 34.0 (pg)    MCHC 30.9  30.0 - 36.0 (g/dL)    RDW 40.9 (*) 81.1 - 15.5 (%)    Platelets 344  150 - 400 (K/uL)   BASIC METABOLIC PANEL     Status: Abnormal   Collection Time   11/16/11  7:16 PM      Component Value Range Comment   Sodium 143  135 - 145 (mEq/L)    Potassium 3.1 (*) 3.5 - 5.1 (mEq/L)    Chloride 100  96 - 112 (mEq/L)    CO2 27  19 - 32 (mEq/L)    Glucose, Bld 106 (*) 70 - 99 (mg/dL)    BUN 11  6 - 23 (mg/dL)    Creatinine, Ser 9.14  0.50 - 1.10 (mg/dL)    Calcium 9.9  8.4 - 10.5 (mg/dL)  GFR calc non Af Amer >90  >90 (mL/min)    GFR calc Af Amer >90  >90 (mL/min)   D-DIMER, QUANTITATIVE     Status: Abnormal   Collection Time   11/16/11  7:28 PM      Component Value Range Comment   D-Dimer, Quant 0.60 (*) 0.00 - 0.48 (ug/mL-FEU)   PROTIME-INR     Status: Abnormal   Collection Time   11/16/11  7:28 PM      Component Value Range Comment   Prothrombin Time 26.0 (*) 11.6 - 15.2 (seconds)    INR 2.34 (*) 0.00 - 1.49    POCT I-STAT TROPONIN I     Status: Normal   Collection Time   11/16/11  8:35 PM      Component Value Range Comment   Troponin i, poc 0.00  0.00 - 0.08 (ng/mL)    Comment 3             Dg Chest 2 View  11/16/2011  *RADIOLOGY REPORT*  Clinical Data: Left-sided chest pain.  CHEST - 2 VIEW  Comparison: Two-view chest 06/27/2011.  Findings: The heart size is normal.  The left hemidiaphragm is slightly elevated.  The lungs are clear.  Degenerative changes in the thoracic spine are stable.  IMPRESSION:  1.  Slight elevation of the left hemidiaphragm.  This could represent splinting associated with the left-sided pain.  No focal abnormality is evident otherwise. 2.  Stable degenerative changes in the thoracic spine.  Original Report Authenticated By: Jamesetta Orleans. MATTERN, M.D.   Ct Angio Chest W/cm &/or Wo Cm  11/16/2011  *RADIOLOGY REPORT*  Clinical Data: Pain and shortness of breath.  History of pulmonary embolus.  CT ANGIOGRAPHY CHEST  Technique:   Multidetector CT imaging of the chest using the standard protocol during bolus administration of intravenous contrast. Multiplanar reconstructed images including MIPs were obtained and reviewed to evaluate the vascular anatomy.  Contrast: OMNIPAQUE IOHEXOL 300 MG/ML IV SOLN  Comparison: Plain films of the chest earlier this same day and 06/27/2011.  CT chest 11/05/2009.  Findings: No pulmonary embolus is identified.  There is no pleural or pericardial effusion.  Heart size is normal.  No axillary, hilar or mediastinal lymphadenopathy.  The lungs demonstrate some dependent atelectasis.  Incidentally imaged upper abdomen is unremarkable.  No focal bony abnormality.  IMPRESSION: Negative for pulmonary embolus.  No acute finding.  Original Report Authenticated By: Bernadene Bell. Maricela Curet, M.D.      Assessment Present on Admission:  .Chest pain .Asthma .GERD (gastroesophageal reflux disease) .Hyperlipidemia   PLAN: Atypical chest pain, with negative EKG, negative CT pulmonary angiogram, and negative cardiac markers, admitted for rule out. Will get serial CPKs and troponins. I do not think that this is acute coronary syndrome. I will continue her Coumadin. Be worthwhile to have chemical nuclear stress test, since she cannot walk on a treadmill. I suspect that this may be esophageal. We'll continue her other medications. She is stable, full code, and will be admitted to [facility] hospitalist service  Other plans as per orders.    Nyisha Clippard 11/17/2011, 1:13 AM

## 2011-11-17 NOTE — Progress Notes (Signed)
PHARMACY - ANTICOAGULATION CONSULT  Pharmacy Consult for: Coumadin  Indication: H/o pulmonary embolism    Patient Data:   Allergies: Allergies  Allergen Reactions  . Shellfish Allergy Hives and Nausea And Vomiting    Patient Measurements: Height: 5\' 2"  (157.5 cm) Weight: 220 lb 14.4 oz (100.2 kg) IBW/kg (Calculated) : 50.1    Vital Signs: Temp:  [97.7 F (36.5 C)-97.8 F (36.6 C)] 97.8 F (36.6 C) (01/24 0500) Pulse Rate:  [77-92] 77  (01/24 0500) Resp:  [14-20] 20  (01/24 0500) BP: (108-141)/(68-93) 119/73 mmHg (01/24 0500) SpO2:  [95 %-100 %] 95 % (01/24 0500) Weight:  [218 lb 0.6 oz (98.9 kg)-220 lb 14.4 oz (100.2 kg)] 220 lb 14.4 oz (100.2 kg) (01/24 0500)  Intake/Output from previous day:  Intake/Output Summary (Last 24 hours) at 11/17/11 0824 Last data filed at 11/17/11 0122  Gross per 24 hour  Intake      3 ml  Output      0 ml  Net      3 ml    Labs:  Basename 11/17/11 0615 11/16/11 1928 11/16/11 1916  HGB 10.5* -- 12.8  HCT 34.4* -- 41.4  PLT 292 -- 344  APTT -- -- --  LABPROT 31.0* 26.0* --  INR 2.93* 2.34* --  HEPARINUNFRC -- -- --  CREATININE 0.70 -- 0.74  CKTOTAL -- -- --  CKMB -- -- --  TROPONINI -- -- --   Estimated Creatinine Clearance: 87.9 ml/min (by C-G formula based on Cr of 0.7).  Medical History: Past Medical History  Diagnosis Date  . Asthma   . Pulmonary embolism   . High cholesterol   . Acid reflux     Scheduled medications:     . aspirin EC  325 mg Oral Daily  . furosemide  40 mg Oral BID  . lidocaine  1 patch Transdermal Q24H  . morphine  4 mg Intravenous Once  . ondansetron  4 mg Intravenous Once  . pantoprazole  40 mg Oral Q1200  . potassium chloride  10 mEq Intravenous Q1 Hr x 2  . potassium chloride  20 mEq Oral Daily  . rosuvastatin  20 mg Oral Daily  . sodium chloride  3 mL Intravenous Q12H  . warfarin  2.5 mg Oral Custom  . warfarin  5 mg Oral Custom  . DISCONTD: potassium chloride  40 mEq Oral Daily      Assessment:  56 y.o. female with h/o PEx2 admitted on 11/16/2011 with chest pain. Pharmacy consulted to manage Coumadin. INR up to 2.93 this am. Hgb and platelets decreased from baseline. (Home Coumadin regimen of 2.5mg  daily except 5mg  on Mondays and Fridays. )   Cardiovascular: HLD with atypical CP, Negative EKG, negative cardiac enzymes.  Gastrointestinal / Nutrition: GERD Pulmonary: asthma Pain: h/o back surgery and R TKA. Home meds: Flexeril, Valium, Voltaren gel, Ibuprofen, Lidocain patch, MSIR, Oxaprozin, OxyIR, Percocet 10,    Goal of Therapy:  1. INR 2-3  Plan:  1. Continue home Coumadin regimen of Coumadin 2.5mg  daily, except 5mg  on Mondays and Fridays.  2. Daily PT / INR 3. Noted patient not on home PPI? With GERD and NSAIDs ordered prn for pain. Please consider PPI at discharge.
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
## 2011-11-17 NOTE — Progress Notes (Signed)
PHARMACY - ANTICOAGULATION CONSULT INITIAL NOTE  Pharmacy Consult for: Coumadin  Indication: H/o pulmonary embolism    Patient Data:   Allergies: Allergies  Allergen Reactions  . Shellfish Allergy Hives and Nausea And Vomiting    Patient Measurements: Height: 5\' 2"  (157.5 cm) IBW/kg (Calculated) : 50.1    Vital Signs: Temp:  [97.7 F (36.5 C)-97.8 F (36.6 C)] 97.7 F (36.5 C) (01/24 0027) Pulse Rate:  [85-92] 85  (01/24 0027) Resp:  [14-18] 18  (01/24 0027) BP: (108-141)/(68-93) 132/93 mmHg (01/24 0027) SpO2:  [99 %-100 %] 99 % (01/24 0027)  Intake/Output from previous day: No intake or output data in the 24 hours ending 11/17/11 0032  Labs:  Basename 11/16/11 1928 11/16/11 1916  HGB -- 12.8  HCT -- 41.4  PLT -- 344  APTT -- --  LABPROT 26.0* --  INR 2.34* --  HEPARINUNFRC -- --  CREATININE -- 0.74  CKTOTAL -- --  CKMB -- --  TROPONINI -- --   The CrCl is unknown because both a height and weight (above a minimum accepted value) are required for this calculation.  Medical History: Past Medical History  Diagnosis Date  . Asthma   . Pulmonary embolism   . High cholesterol   . Acid reflux     Scheduled medications:     . aspirin EC  325 mg Oral Daily  . furosemide  40 mg Oral BID  . lidocaine  1 patch Transdermal Q24H  . morphine  4 mg Intravenous Once  . ondansetron  4 mg Intravenous Once  . pantoprazole  40 mg Oral Q1200  . rosuvastatin  20 mg Oral Daily  . sodium chloride  3 mL Intravenous Q12H     Assessment:  56 y.o. female with h/o PE admitted on 11/16/2011 with chest pain. Pharmacy consulted to manage Coumadin. Home Coumadin regimen of 2.5mg  daily except 5mg  on Mondays and Fridays.   Goal of Therapy:  1. INR 2-3  Plan:  1. Continue home Coumadin regimen of Coumadin 2.5mg  daily, except 5mg  on Mondays and Fridays.  2. Daily PT / INR   Onalee Hua C. Thad Ranger, PharmD 11/17/2011, 12:32 AM

## 2011-11-18 LAB — CBC
Hemoglobin: 11 g/dL — ABNORMAL LOW (ref 12.0–15.0)
MCV: 76.2 fL — ABNORMAL LOW (ref 78.0–100.0)
Platelets: 295 10*3/uL (ref 150–400)
RBC: 4.87 MIL/uL (ref 3.87–5.11)
WBC: 7.3 10*3/uL (ref 4.0–10.5)

## 2011-11-18 LAB — PROTIME-INR: Prothrombin Time: 29.4 seconds — ABNORMAL HIGH (ref 11.6–15.2)

## 2011-11-18 NOTE — Progress Notes (Signed)
PHARMACY - ANTICOAGULATION CONSULT  Pharmacy Consult for: Coumadin  Indication: H/o pulmonary embolism    Patient Data:   Allergies: Allergies  Allergen Reactions  . Shellfish Allergy Hives and Nausea And Vomiting    Patient Measurements: Height: 5\' 2"  (157.5 cm) Weight: 220 lb 14.4 oz (100.2 kg) IBW/kg (Calculated) : 50.1    Vital Signs: Temp:  [97.4 F (36.3 C)-97.9 F (36.6 C)] 97.6 F (36.4 C) (01/25 0629) Pulse Rate:  [74-78] 74  (01/25 0800) Resp:  [18-20] 18  (01/25 0629) BP: (106-111)/(69-76) 106/76 mmHg (01/25 0800) SpO2:  [95 %-99 %] 95 % (01/25 0629) Weight:  [220 lb 14.4 oz (100.2 kg)] 220 lb 14.4 oz (100.2 kg) (01/25 0629)  Intake/Output from previous day:  Intake/Output Summary (Last 24 hours) at 11/18/11 0919 Last data filed at 11/18/11 0600  Gross per 24 hour  Intake    245 ml  Output      2 ml  Net    243 ml    Labs:  Basename 11/18/11 0443 11/18/11 0009 11/17/11 1528 11/17/11 0850 11/17/11 0615 11/16/11 1928 11/16/11 1916  HGB 11.0* -- -- -- 10.5* -- --  HCT 37.1 -- -- -- 34.4* -- 41.4  PLT 295 -- -- -- 292 -- 344  APTT -- -- -- -- -- -- --  LABPROT 29.4* -- -- -- 31.0* 26.0* --  INR 2.73* -- -- -- 2.93* 2.34* --  HEPARINUNFRC -- -- -- -- -- -- --  CREATININE -- -- -- -- 0.70 -- 0.74  CKTOTAL -- 122 128 135 -- -- --  CKMB -- 2.6 2.8 2.7 -- -- --  TROPONINI -- <0.30 <0.30 <0.30 -- -- --   Estimated Creatinine Clearance: 87.9 ml/min (by C-G formula based on Cr of 0.7).  Medical History: Past Medical History  Diagnosis Date  . Asthma   . Pulmonary embolism   . High cholesterol   . Acid reflux    Scheduled medications:     . aspirin EC  81 mg Oral Daily  . furosemide  40 mg Oral BID  . lidocaine  1 patch Transdermal Q24H  . pantoprazole  40 mg Oral Q1200  . potassium chloride  20 mEq Oral Daily  . rosuvastatin  20 mg Oral Daily  . sodium chloride  3 mL Intravenous Q12H  . warfarin  2.5 mg Oral Custom  . warfarin  5 mg Oral  Custom  . DISCONTD: aspirin EC  325 mg Oral Daily    Assessment:  56 y.o. female with h/o PEx2 admitted on 11/16/2011 with chest pain. Pharmacy consulted to manage Coumadin. INR remains at goal this am, and CBC stable.  No bleeding noted.  (Home Coumadin regimen of 2.5mg  daily except 5mg  on Mondays and Fridays. )   Cardiovascular: HLD with atypical CP: per cardiology work up no further cardiac work up warranted at this time, no lipid panel avaliable in [facility] consider evaluation unless otherwise clinically indicated :  Aspirin, furosemide Gastrointestinal / Nutrition: GERD Neurology: Pain: h/o back surgery and R TKA. Extensive Home med regimen: Flexeril, Valium, Voltaren gel, Ibuprofen, Lidocain patch, MSIR, diazepam,  Oxaprozin, OxyIR, Percocet 10, midrin - medications listed in [facility] have not been ordered this hospital stay, consider resuming for improved pain management unless otherwise clinically indicated.  Goal of Therapy:  1. INR 2-3  Plan:  1. Continue home Coumadin regimen of Coumadin 2.5mg  daily, except 5mg  on Mondays and Fridays.  2. Daily PT / INR

## 2011-11-18 NOTE — Discharge Summary (Signed)
Discharge Summary  [name] MR#: [id number]  DOB:May 15, 1956  Date of Admission: 11/16/2011 Date of Discharge: 11/18/2011  Patient's PCP: Dr. Shary Decamp  Attending Physician:HONGALGI,ANAND  Consults: Cardiology: Dr. Yates Decamp   Discharge Diagnoses: 1. Atypical chest pain, resolved 2. Chronic pain 3. History of recurrent venous thromboembolism, on Coumadin 4. Asthma 5. Gastroesophageal reflux disease 6. Hyperlipidemia 7. Anemia 8. Hypokalemia   Brief Admitting History and Physical 56 year old Caucasian female patient with history of chronic pain who is on multiple pain medications, follows up with the pain M.D., recommended venous thromboembolism on anticoagulation, asthma and gastroesophageal reflux disease presented with history of chest pain that began at 4 PM on the evening of admission. She had her recent negative stress test on 05/27/2011. preceding knee surgery. She does have strong family history of coronary artery disease.  Discharge Medications Current Discharge Medication List    CONTINUE these medications which have NOT CHANGED   Details  cyclobenzaprine (FLEXERIL) 10 MG tablet Take 10 mg by mouth 3 (three) times daily as needed. For muscle spasms    diazepam (VALIUM) 10 MG tablet Take 10 mg by mouth every 12 (twelve) hours as needed. For muscle spasms    diclofenac sodium (VOLTAREN) 1 % GEL Apply 1 application topically 4 (four) times daily. To back and legs    diphenhydrAMINE (BENADRYL) 25 mg capsule Take 25 mg by mouth every 6 (six) hours as needed. For allergies    furosemide (LASIX) 40 MG tablet Take 40 mg by mouth 2 (two) times daily.    lidocaine (LIDODERM) 5 % Place 1 patch onto the skin daily. Remove & Discard patch within 12 hours or as directed by MD    morphine (MSIR) 30 MG tablet Take 30 mg by mouth every 8 (eight) hours as needed. For pain    omeprazole (PRILOSEC) 40 MG capsule Take 40 mg by mouth daily.    OXAPROZIN PO Take 600 mg by  mouth 2 (two) times daily as needed. For arthritis    promethazine (PHENERGAN) 25 MG tablet Take 25 mg by mouth every 6 (six) hours as needed. For nausea    rosuvastatin (CRESTOR) 20 MG tablet Take 20 mg by mouth daily.    warfarin (COUMADIN) 5 MG tablet Take 2.5-5 mg by mouth daily. Take 2.5 mg daily except for mondays and Friday take 5 mg      STOP taking these medications     ibuprofen (ADVIL,MOTRIN) 200 MG tablet Comments:  Reason for Stopping:       oxycodone (OXY-IR) 5 MG capsule Comments:  Reason for Stopping:       oxyCODONE-acetaminophen (PERCOCET) 10-325 MG per tablet Comments:  Reason for Stopping:          Hospital Course: 1. Atypical chest pain: CT angiogram of the chest was negative for new pulmonary embolism. Cardiac enzymes were cycled and negative. EKG was negative for any acute ischemic changes. Cardiology was consulted and they did not recommend further cardiac evaluation unless patient had recurrent chest pain. Patient did not have any further chest pains. 2. Chronic pain: Patient is on polypharmacy of pain medications with lot of duplication of opioids. She indicates that she follows up with the pain M.D. but cannot recollect his name. Advised her regarding the serious and dangerous consequences of being on multiple opioids and recommended that she followup with her pain M.D. to discontinue some of these medications. She verbalized understanding. 3. History of chronic venous thromboembolism: Therapeutically anticoagulated. 4. Asthma: Stable 5. Hypokalemia:  Repleted   Day of Discharge BP 111/74  Pulse 78  Temp(Src) 98.7 F (37.1 C) (Oral)  Resp 18  Ht 5\' 2"  (1.575 m)  Wt 100.2 kg (220 lb 14.4 oz)  BMI 40.40 kg/m2  SpO2 96% General Exam: Comfortable. Overweight  Respiratory System: Clear. No increased work of breathing. No chest wall tenderness.  Cardiovascular System: First and second heart sounds heard. Regular rate and rhythm. No JVD/murmurs. Telemetry  shows sinus rhythm Gastrointestinal System: Abdomen is non distended, soft and normal bowel sounds heard.  Central Nervous System: Alert and oriented. No focal neurological deficits.  Results for orders placed during the hospital encounter of 11/16/11 (from the past 48 hour(s))  CBC     Status: Abnormal   Collection Time   11/16/11  7:16 PM      Component Value Range Comment   WBC 13.3 (*) 4.0 - 10.5 (K/uL)    RBC 5.56 (*) 3.87 - 5.11 (MIL/uL)    Hemoglobin 12.8  12.0 - 15.0 (g/dL)    HCT 11.9  14.7 - 82.9 (%)    MCV 74.5 (*) 78.0 - 100.0 (fL)    MCH 23.0 (*) 26.0 - 34.0 (pg)    MCHC 30.9  30.0 - 36.0 (g/dL)    RDW 56.2 (*) 13.0 - 15.5 (%)    Platelets 344  150 - 400 (K/uL)   BASIC METABOLIC PANEL     Status: Abnormal   Collection Time   11/16/11  7:16 PM      Component Value Range Comment   Sodium 143  135 - 145 (mEq/L)    Potassium 3.1 (*) 3.5 - 5.1 (mEq/L)    Chloride 100  96 - 112 (mEq/L)    CO2 27  19 - 32 (mEq/L)    Glucose, Bld 106 (*) 70 - 99 (mg/dL)    BUN 11  6 - 23 (mg/dL)    Creatinine, Ser 8.65  0.50 - 1.10 (mg/dL)    Calcium 9.9  8.4 - 10.5 (mg/dL)    GFR calc non Af Amer >90  >90 (mL/min)    GFR calc Af Amer >90  >90 (mL/min)   D-DIMER, QUANTITATIVE     Status: Abnormal   Collection Time   11/16/11  7:28 PM      Component Value Range Comment   D-Dimer, Quant 0.60 (*) 0.00 - 0.48 (ug/mL-FEU)   PROTIME-INR     Status: Abnormal   Collection Time   11/16/11  7:28 PM      Component Value Range Comment   Prothrombin Time 26.0 (*) 11.6 - 15.2 (seconds)    INR 2.34 (*) 0.00 - 1.49    POCT I-STAT TROPONIN I     Status: Normal   Collection Time   11/16/11  8:35 PM      Component Value Range Comment   Troponin i, poc 0.00  0.00 - 0.08 (ng/mL)    Comment 3            BASIC METABOLIC PANEL     Status: Abnormal   Collection Time   11/17/11  6:15 AM      Component Value Range Comment   Sodium 142  135 - 145 (mEq/L)    Potassium 3.7  3.5 - 5.1 (mEq/L)    Chloride 104   96 - 112 (mEq/L)    CO2 29  19 - 32 (mEq/L)    Glucose, Bld 112 (*) 70 - 99 (mg/dL)    BUN 11  6 -  23 (mg/dL)    Creatinine, Ser 1.61  0.50 - 1.10 (mg/dL)    Calcium 9.0  8.4 - 10.5 (mg/dL)    GFR calc non Af Amer >90  >90 (mL/min)    GFR calc Af Amer >90  >90 (mL/min)   CBC     Status: Abnormal   Collection Time   11/17/11  6:15 AM      Component Value Range Comment   WBC 10.0  4.0 - 10.5 (K/uL)    RBC 4.58  3.87 - 5.11 (MIL/uL)    Hemoglobin 10.5 (*) 12.0 - 15.0 (g/dL) DELTA CHECK NOTED   HCT 34.4 (*) 36.0 - 46.0 (%)    MCV 75.1 (*) 78.0 - 100.0 (fL)    MCH 22.9 (*) 26.0 - 34.0 (pg)    MCHC 30.5  30.0 - 36.0 (g/dL)    RDW 09.6 (*) 04.5 - 15.5 (%)    Platelets 292  150 - 400 (K/uL)   PROTIME-INR     Status: Abnormal   Collection Time   11/17/11  6:15 AM      Component Value Range Comment   Prothrombin Time 31.0 (*) 11.6 - 15.2 (seconds)    INR 2.93 (*) 0.00 - 1.49    CARDIAC PANEL(CRET KIN+CKTOT+MB+TROPI)     Status: Normal   Collection Time   11/17/11  8:50 AM      Component Value Range Comment   Total CK 135  7 - 177 (U/L)    CK, MB 2.7  0.3 - 4.0 (ng/mL)    Troponin I <0.30  <0.30 (ng/mL)    Relative Index 2.0  0.0 - 2.5    CARDIAC PANEL(CRET KIN+CKTOT+MB+TROPI)     Status: Normal   Collection Time   11/17/11  3:28 PM      Component Value Range Comment   Total CK 128  7 - 177 (U/L)    CK, MB 2.8  0.3 - 4.0 (ng/mL)    Troponin I <0.30  <0.30 (ng/mL)    Relative Index 2.2  0.0 - 2.5    CARDIAC PANEL(CRET KIN+CKTOT+MB+TROPI)     Status: Normal   Collection Time   11/18/11 12:09 AM      Component Value Range Comment   Total CK 122  7 - 177 (U/L)    CK, MB 2.6  0.3 - 4.0 (ng/mL)    Troponin I <0.30  <0.30 (ng/mL)    Relative Index 2.1  0.0 - 2.5    PROTIME-INR     Status: Abnormal   Collection Time   11/18/11  4:43 AM      Component Value Range Comment   Prothrombin Time 29.4 (*) 11.6 - 15.2 (seconds)    INR 2.73 (*) 0.00 - 1.49    CBC     Status: Abnormal    Collection Time   11/18/11  4:43 AM      Component Value Range Comment   WBC 7.3  4.0 - 10.5 (K/uL)    RBC 4.87  3.87 - 5.11 (MIL/uL)    Hemoglobin 11.0 (*) 12.0 - 15.0 (g/dL)    HCT 40.9  81.1 - 91.4 (%)    MCV 76.2 (*) 78.0 - 100.0 (fL)    MCH 22.6 (*) 26.0 - 34.0 (pg)    MCHC 29.6 (*) 30.0 - 36.0 (g/dL)    RDW 78.2 (*) 95.6 - 15.5 (%)    Platelets 295  150 - 400 (K/uL)     Dg Chest 2 View  11/16/2011  *RADIOLOGY REPORT*  Clinical Data: Left-sided chest pain.  CHEST - 2 VIEW  Comparison: Two-view chest 06/27/2011.  Findings: The heart size is normal.  The left hemidiaphragm is slightly elevated.  The lungs are clear.  Degenerative changes in the thoracic spine are stable.  IMPRESSION:  1.  Slight elevation of the left hemidiaphragm.  This could represent splinting associated with the left-sided pain.  No focal abnormality is evident otherwise. 2.  Stable degenerative changes in the thoracic spine.  Original Report Authenticated By: Jamesetta Orleans. MATTERN, M.D.   Ct Angio Chest W/cm &/or Wo Cm  11/16/2011  *RADIOLOGY REPORT*  Clinical Data: Pain and shortness of breath.  History of pulmonary embolus.  CT ANGIOGRAPHY CHEST  Technique:  Multidetector CT imaging of the chest using the standard protocol during bolus administration of intravenous contrast. Multiplanar reconstructed images including MIPs were obtained and reviewed to evaluate the vascular anatomy.  Contrast: OMNIPAQUE IOHEXOL 300 MG/ML IV SOLN  Comparison: Plain films of the chest earlier this same day and 06/27/2011.  CT chest 11/05/2009.  Findings: No pulmonary embolus is identified.  There is no pleural or pericardial effusion.  Heart size is normal.  No axillary, hilar or mediastinal lymphadenopathy.  The lungs demonstrate some dependent atelectasis.  Incidentally imaged upper abdomen is unremarkable.  No focal bony abnormality.  IMPRESSION: Negative for pulmonary embolus.  No acute finding.  Original Report Authenticated By:  Bernadene Bell. Maricela Curet, M.D.    Disposition: Discharged home in stable condition.  Diet: Heart healthy.  Activity: Increase activity gradually   Follow-up Appts: Discharge Orders    Future Orders Please Complete By Expires   Diet - low sodium heart healthy      Increase activity slowly      Call MD for:  severe uncontrolled pain         TESTS THAT NEED FOLLOW-UP None  Time spent on discharge, talking to the patient, and coordinating care: 35 mins.   SignedMarcellus Scott, MD 11/18/2011, 4:29 PM

## 2011-11-18 NOTE — Progress Notes (Signed)
INITIAL ADULT NUTRITION ASSESSMENT Date: 11/18/2011   Time: 9:31 AM  Reason for Assessment: Nutrition Risk Assessment  ASSESSMENT: Female 56 y.o.  Dx: Chest pain  Hx:  Past Medical History  Diagnosis Date  . Asthma   . Pulmonary embolism   . High cholesterol   . Acid reflux    Related Meds:     . aspirin EC  81 mg Oral Daily  . furosemide  40 mg Oral BID  . lidocaine  1 patch Transdermal Q24H  . pantoprazole  40 mg Oral Q1200  . potassium chloride  20 mEq Oral Daily  . rosuvastatin  20 mg Oral Daily  . sodium chloride  3 mL Intravenous Q12H  . warfarin  2.5 mg Oral Custom  . warfarin  5 mg Oral Custom  . DISCONTD: aspirin EC  325 mg Oral Daily    Ht: 5\' 2"  (157.5 cm)  Wt: 220 lb 14.4 oz (100.2 kg)   Ideal Wt: 50 kg % Ideal Wt: 200%  Usual Wt: 94 kg per pt % Usual Wt: 106.5%  Body mass index is 40.40 kg/(m^2). Morbid Obesity  Food/Nutrition Related Hx:  Pt reports a 50 lb weight loss in the past year and 80 lbs total in the past 2 years. Pt stated some of the weight loss was intentional, however she lost more than she had planned. Some of this was due to multiple illnesses in the past year including diverticulitis and a total knee replacement. In the past year pt states her appetite has been much less. With the diverticulitis pt stated she had persistent n/v. Pt states her appetite is still low and she now wants to keep this weight she lost off. Pt states she eats 2 small meals a day; she has eaten this way most of her life. PO intake this am was 100%. Pt states her father has CHF and they all follow a heart healthy diet at home.   Some fluctuation in weight due to edema.   Labs:  CMP     Component Value Date/Time   NA 142 11/17/2011 0615   K 3.7 11/17/2011 0615   CL 104 11/17/2011 0615   CO2 29 11/17/2011 0615   GLUCOSE 112* 11/17/2011 0615   BUN 11 11/17/2011 0615   CREATININE 0.70 11/17/2011 0615   CALCIUM 9.0 11/17/2011 0615   PROT 7.6 06/16/2011 1530   ALBUMIN 3.6 06/16/2011 1530   AST 17 06/16/2011 1530   ALT 18 06/16/2011 1530   ALKPHOS 84 06/16/2011 1530   BILITOT 0.6 06/16/2011 1530   GFRNONAA >90 11/17/2011 0615   GFRAA >90 11/17/2011 0615    Intake/Output Summary (Last 24 hours) at 11/18/11 0942 Last data filed at 11/18/11 0800  Gross per 24 hour  Intake    605 ml  Output      2 ml  Net    603 ml    Diet Order: Heart Healthy  Supplements/Tube Feeding: N/A  IVF:    Estimated Nutritional Needs:   Kcal: 1800-2000 Protein: 95-105 gm Fluid: 1.8-2.0 L  Suspect some level of malnutrition given weight loss, fluid retention, and intake history.  NUTRITION DIAGNOSIS: -Inadequate oral intake (NI-2.1).  Status: Ongoing  RELATED TO: limited food accepttance due to food aversion  AS EVIDENCE BY: pt report  MONITORING/EVALUATION(Goals): Goal: meet >90% of estimated nutrition needs to prevent malnutrition Monitor: PO intake, labs, weight, I/O's  EDUCATION NEEDS: -No education needs identified at this time  INTERVENTION:  No nutrition intervention at this  time -- pt declined  RD to follow for nutrition care plan  Dietitian #[phone]  DOCUMENTATION CODES Per approved criteria  -Morbid Obesity    Karenann Cai 11/18/2011, 9:31 AM  [facility] Pager #: [phone]

## 2012-02-17 ENCOUNTER — Encounter (HOSPITAL_COMMUNITY): Payer: Self-pay | Admitting: *Deleted

## 2012-02-17 ENCOUNTER — Emergency Department (HOSPITAL_COMMUNITY): Payer: Medicare Other

## 2012-02-17 ENCOUNTER — Emergency Department (HOSPITAL_COMMUNITY)
Admission: EM | Admit: 2012-02-17 | Discharge: 2012-02-18 | Disposition: A | Discharge status: Home / Self Care | Payer: Medicare Other | Attending: Emergency Medicine | Admitting: Emergency Medicine

## 2012-02-17 DIAGNOSIS — E7800 Pure hypercholesterolemia, unspecified: Secondary | ICD-10-CM | POA: Insufficient documentation

## 2012-02-17 DIAGNOSIS — J45909 Unspecified asthma, uncomplicated: Secondary | ICD-10-CM | POA: Insufficient documentation

## 2012-02-17 DIAGNOSIS — Z79899 Other long term (current) drug therapy: Secondary | ICD-10-CM | POA: Insufficient documentation

## 2012-02-17 DIAGNOSIS — R109 Unspecified abdominal pain: Secondary | ICD-10-CM | POA: Insufficient documentation

## 2012-02-17 DIAGNOSIS — K219 Gastro-esophageal reflux disease without esophagitis: Secondary | ICD-10-CM | POA: Insufficient documentation

## 2012-02-17 LAB — URINE MICROSCOPIC-ADD ON

## 2012-02-17 LAB — URINALYSIS, ROUTINE W REFLEX MICROSCOPIC
Bilirubin Urine: NEGATIVE
Glucose, UA: NEGATIVE mg/dL
Hgb urine dipstick: NEGATIVE
Specific Gravity, Urine: 1.019 (ref 1.005–1.030)
pH: 6.5 (ref 5.0–8.0)

## 2012-02-17 LAB — LIPASE, BLOOD: Lipase: 14 U/L (ref 11–59)

## 2012-02-17 LAB — DIFFERENTIAL
Lymphocytes Relative: 28 % (ref 12–46)
Monocytes Absolute: 0.9 10*3/uL (ref 0.1–1.0)
Monocytes Relative: 8 % (ref 3–12)
Neutro Abs: 7.8 10*3/uL — ABNORMAL HIGH (ref 1.7–7.7)

## 2012-02-17 LAB — CBC
HCT: 39.1 % (ref 36.0–46.0)
Hemoglobin: 12.1 g/dL (ref 12.0–15.0)
WBC: 12.2 10*3/uL — ABNORMAL HIGH (ref 4.0–10.5)

## 2012-02-17 LAB — COMPREHENSIVE METABOLIC PANEL
Albumin: 3.8 g/dL (ref 3.5–5.2)
BUN: 9 mg/dL (ref 6–23)
Calcium: 9.5 mg/dL (ref 8.4–10.5)
Chloride: 101 mEq/L (ref 96–112)
Creatinine, Ser: 0.62 mg/dL (ref 0.50–1.10)
Total Bilirubin: 0.3 mg/dL (ref 0.3–1.2)

## 2012-02-17 LAB — PROTIME-INR: INR: 1.72 — ABNORMAL HIGH (ref 0.00–1.49)

## 2012-02-17 MED ORDER — SODIUM CHLORIDE 0.9 % IV SOLN
INTRAVENOUS | Status: DC
  Administered 2012-02-17: 23:00:00 via INTRAVENOUS

## 2012-02-17 MED ORDER — ONDANSETRON HCL 4 MG/2ML IJ SOLN
4.0000 mg | Freq: Once | INTRAMUSCULAR | Status: AC
  Administered 2012-02-17: 4 mg via INTRAVENOUS
  Filled 2012-02-17: qty 2

## 2012-02-17 MED ORDER — IOHEXOL 300 MG/ML  SOLN
20.0000 mL | INTRAMUSCULAR | Status: AC
  Administered 2012-02-17: 20 mL via ORAL

## 2012-02-17 MED ORDER — HYDROMORPHONE HCL PF 1 MG/ML IJ SOLN
1.0000 mg | Freq: Once | INTRAMUSCULAR | Status: AC
  Administered 2012-02-17: 1 mg via INTRAVENOUS
  Filled 2012-02-17: qty 1

## 2012-02-17 MED ORDER — IOHEXOL 300 MG/ML  SOLN
100.0000 mL | Freq: Once | INTRAMUSCULAR | Status: AC | PRN
  Administered 2012-02-17: 100 mL via INTRAVENOUS

## 2012-02-17 NOTE — ED Notes (Signed)
Patient transported to CT 

## 2012-02-17 NOTE — ED Notes (Addendum)
C/o R mid abd pain, onset 1650, 'felt fine before that", (denies: fever, bleeding, nvd, dizziness, sob, urinary sx or vaginal sx), last BM this am (normal), last ate (a cookie) at 1800.  Also, "some back pain, but has back problems". Admits some belching and passing gas. Tried a percocet at 1700 w/o relief. Alert, NAD, calm, interactive, appears uncomforatable (rubbing belly and intermittant sigh/groan). Recent bladder infection, took last abx last Friday.

## 2012-02-17 NOTE — ED Notes (Signed)
Iv team at bedisde

## 2012-02-17 NOTE — ED Provider Notes (Signed)
History     CSN: [id number]  Arrival date & time 02/17/12  1907   First MD Initiated Contact with Patient 02/17/12 2102      Chief Complaint  Patient presents with  . Abdominal Pain    (Consider location/radiation/quality/duration/timing/severity/associated sxs/prior treatment) Patient is a 56 y.o. female presenting with abdominal pain. The history is provided by the patient.  Abdominal Pain The primary symptoms of the illness include abdominal pain. The primary symptoms of the illness do not include fever, shortness of breath, nausea, vomiting, diarrhea or dysuria.  Symptoms associated with the illness do not include chills or hematuria.   the patient is a 56 year old female, with a history of cholecystectomy, who presents to emergency department complaining of acute onset of right-sided abdominal pain.  The pain started around 10 minutes until 5:00 this evening.  She denies chest pain or shortness of breath.  She denies nausea, vomiting, diarrhea, or urinary tract symptoms.  She has not had peptic ulcer disease in the past.  She denies a history of diabetes.  She rarely drinks alcohol.  She does have a history of pulmonary embolism, and is currently taking Coumadin.  Level V caveat applies for urgent need for intervention.  Because of severe pain  Past Medical History  Diagnosis Date  . Asthma   . Pulmonary embolism   . High cholesterol   . Acid reflux     Past Surgical History  Procedure Date  . Replacement total knee   . Cholecystectomy   . Abdominal hysterectomy   . Back surgery   . Joint replacement   . Carpal tunnel release   .  bone spurs removed from feet   . Tubal ligation     Family History  Problem Relation Age of Onset  . Heart failure Mother   . COPD Mother   . Heart failure Father   . COPD Father   . Cancer Other   . Heart failure Other     History  Substance Use Topics  . Smoking status: Never Smoker   . Smokeless tobacco: Not on file  . Alcohol  Use: No    OB History    Grav Para Term Preterm Abortions TAB SAB Ect Mult Living                  Review of Systems  Constitutional: Negative for fever and chills.  Respiratory: Negative for cough and shortness of breath.   Cardiovascular: Negative for chest pain.  Gastrointestinal: Positive for abdominal pain. Negative for nausea, vomiting and diarrhea.  Genitourinary: Negative for dysuria and hematuria.  Neurological: Negative for headaches.  Psychiatric/Behavioral: Negative for confusion.  All other systems reviewed and are negative.    Allergies  Shellfish allergy  Home Medications   Current Outpatient Rx  Name Route Sig Dispense Refill  . ALBUTEROL SULFATE HFA 108 (90 BASE) MCG/ACT IN AERS Inhalation Inhale 2 puffs into the lungs every 6 (six) hours as needed. For wheezing    . CYCLOBENZAPRINE HCL 10 MG PO TABS Oral Take 10 mg by mouth 3 (three) times daily as needed. For muscle spasms    . FUROSEMIDE 40 MG PO TABS Oral Take 40 mg by mouth daily.     . ISOMETHEPTENE-APAP-DICHLORAL 65-325-100 MG PO CAPS Oral Take 1-2 capsules by mouth 4 (four) times daily as needed. For headaches. Max dose is 5 tabs in 24 hrs. Take 2 tabs at the onset of headache then take 1 tab each hour if needed.    Marland Kitchen  LIDOCAINE 5 % EX PTCH Transdermal Place 1-3 patches onto the skin daily. Remove & Discard patch within 12 hours or as directed by MD    . LORAZEPAM 1 MG PO TABS Oral Take 1 mg by mouth 2 (two) times daily as needed. For anxiety    . OMEPRAZOLE 40 MG PO CPDR Oral Take 40 mg by mouth 2 (two) times daily.     . OXYCODONE-ACETAMINOPHEN 10-325 MG PO TABS Oral Take 1 tablet by mouth every 8 (eight) hours as needed. For pain    . OXYMORPHONE HCL ER 20 MG PO TB12 Oral Take 20 mg by mouth every 12 (twelve) hours.    Marland Kitchen ROSUVASTATIN CALCIUM 5 MG PO TABS Oral Take 5 mg by mouth daily.    . WARFARIN SODIUM 5 MG PO TABS Oral Take 2.5-5 mg by mouth daily. Take 2.5 mg on Tues, Thurs, Sat & Sun. Take 5 mg  on Mon, Wed & Fri      BP 136/79  Pulse 75  Temp(Src) 97.8 F (36.6 C) (Oral)  Resp 18  SpO2 100%  Physical Exam  Vitals reviewed. Constitutional: She is oriented to person, place, and time. She appears well-developed and well-nourished.       Uncomfortable appearing  HENT:  Head: Normocephalic and atraumatic.  Eyes: Conjunctivae are normal.  Neck: Normal range of motion. Neck supple.  Cardiovascular: Normal rate.   No murmur heard. Pulmonary/Chest: Effort normal and breath sounds normal. No respiratory distress.  Abdominal: Soft. She exhibits no distension. There is tenderness. There is no rebound and no guarding.       Tenderness in the right upper quadrant, with no peritoneal signs  Musculoskeletal: Normal range of motion.  Neurological: She is alert and oriented to person, place, and time.  Skin: Skin is warm and dry.  Psychiatric: She has a normal mood and affect. Thought content normal.    ED Course  Procedures (including critical care time) 15, yo, female, with a history of cholecystectomy , presents to emergency department with acute onset of right-sided abdominal pain, with no other symptoms.  She is in significant amount of pain for which we will start an IV and give IV analgesics, antiemetics.  We will perform laboratory testing, and a CAT scan of her abdomen.  Since she has had a cholecystectomy already.   Labs Reviewed  CBC  DIFFERENTIAL  URINALYSIS, ROUTINE W REFLEX MICROSCOPIC  LIPASE, BLOOD  COMPREHENSIVE METABOLIC PANEL  PROTIME-INR   No results found.   No diagnosis found.  12:01 AM Feels better but would like one more dose of meds before she goes home.   MDM  Abdominal pain        Cheri Guppy, MD 02/18/12 0002

## 2012-02-18 MED ORDER — HYDROMORPHONE HCL PF 1 MG/ML IJ SOLN
1.0000 mg | Freq: Once | INTRAMUSCULAR | Status: AC
  Administered 2012-02-18: 1 mg via INTRAVENOUS
  Filled 2012-02-18: qty 1

## 2012-02-18 MED ORDER — HYDROMORPHONE HCL 2 MG PO TABS: 2.0000 mg | ORAL_TABLET | ORAL | Status: AC | PRN

## 2012-02-18 NOTE — Discharge Instructions (Signed)
Your CAT scan does not show any significant masses signs of obstruction or other severe disease.  Use Dilaudid for pain.  Followup with your physician, for reevaluation.  Return for worse or uncontrolled symptoms

## 2012-06-13 ENCOUNTER — Other Ambulatory Visit: Payer: Self-pay | Admitting: Pain Medicine

## 2012-06-13 DIAGNOSIS — M549 Dorsalgia, unspecified: Secondary | ICD-10-CM

## 2012-06-13 DIAGNOSIS — M542 Cervicalgia: Secondary | ICD-10-CM

## 2012-06-18 ENCOUNTER — Ambulatory Visit
Admission: RE | Admit: 2012-06-18 | Discharge: 2012-06-18 | Disposition: A | Discharge status: Home / Self Care | Payer: Medicare Other | Source: Ambulatory Visit | Attending: Pain Medicine | Admitting: Pain Medicine

## 2012-06-18 DIAGNOSIS — M542 Cervicalgia: Secondary | ICD-10-CM

## 2012-06-18 DIAGNOSIS — M549 Dorsalgia, unspecified: Secondary | ICD-10-CM

## 2012-06-18 MED ORDER — GADOBENATE DIMEGLUMINE 529 MG/ML IV SOLN
20.0000 mL | Freq: Once | INTRAVENOUS | Status: AC | PRN
  Administered 2012-06-18: 20 mL via INTRAVENOUS

## 2012-10-24 DIAGNOSIS — I219 Acute myocardial infarction, unspecified: Secondary | ICD-10-CM

## 2012-10-24 DIAGNOSIS — J189 Pneumonia, unspecified organism: Secondary | ICD-10-CM

## 2012-10-24 HISTORY — DX: Acute myocardial infarction, unspecified: I21.9

## 2012-10-24 HISTORY — DX: Pneumonia, unspecified organism: J18.9

## 2013-02-25 ENCOUNTER — Inpatient Hospital Stay (HOSPITAL_COMMUNITY)
Admission: EM | Admit: 2013-02-25 | Discharge: 2013-02-28 | DRG: 194 | Disposition: A | Discharge status: Home / Self Care | Payer: Medicare Other | Attending: Internal Medicine | Admitting: Internal Medicine

## 2013-02-25 ENCOUNTER — Emergency Department (HOSPITAL_COMMUNITY): Payer: Medicare Other

## 2013-02-25 ENCOUNTER — Encounter (HOSPITAL_COMMUNITY): Payer: Self-pay | Admitting: *Deleted

## 2013-02-25 DIAGNOSIS — N390 Urinary tract infection, site not specified: Secondary | ICD-10-CM | POA: Diagnosis present

## 2013-02-25 DIAGNOSIS — I2782 Chronic pulmonary embolism: Secondary | ICD-10-CM | POA: Diagnosis present

## 2013-02-25 DIAGNOSIS — J45901 Unspecified asthma with (acute) exacerbation: Secondary | ICD-10-CM

## 2013-02-25 DIAGNOSIS — R109 Unspecified abdominal pain: Secondary | ICD-10-CM | POA: Diagnosis present

## 2013-02-25 DIAGNOSIS — E669 Obesity, unspecified: Secondary | ICD-10-CM | POA: Diagnosis present

## 2013-02-25 DIAGNOSIS — A498 Other bacterial infections of unspecified site: Secondary | ICD-10-CM | POA: Diagnosis present

## 2013-02-25 DIAGNOSIS — Z79899 Other long term (current) drug therapy: Secondary | ICD-10-CM

## 2013-02-25 DIAGNOSIS — B370 Candidal stomatitis: Secondary | ICD-10-CM

## 2013-02-25 DIAGNOSIS — E876 Hypokalemia: Secondary | ICD-10-CM | POA: Diagnosis not present

## 2013-02-25 DIAGNOSIS — E785 Hyperlipidemia, unspecified: Secondary | ICD-10-CM | POA: Diagnosis present

## 2013-02-25 DIAGNOSIS — J189 Pneumonia, unspecified organism: Secondary | ICD-10-CM | POA: Diagnosis present

## 2013-02-25 DIAGNOSIS — A088 Other specified intestinal infections: Secondary | ICD-10-CM | POA: Diagnosis present

## 2013-02-25 DIAGNOSIS — Z96659 Presence of unspecified artificial knee joint: Secondary | ICD-10-CM

## 2013-02-25 DIAGNOSIS — J45909 Unspecified asthma, uncomplicated: Secondary | ICD-10-CM | POA: Diagnosis present

## 2013-02-25 DIAGNOSIS — Z7901 Long term (current) use of anticoagulants: Secondary | ICD-10-CM

## 2013-02-25 DIAGNOSIS — J45902 Unspecified asthma with status asthmaticus: Secondary | ICD-10-CM | POA: Diagnosis present

## 2013-02-25 DIAGNOSIS — Z86711 Personal history of pulmonary embolism: Secondary | ICD-10-CM | POA: Diagnosis present

## 2013-02-25 DIAGNOSIS — N3090 Cystitis, unspecified without hematuria: Secondary | ICD-10-CM | POA: Diagnosis present

## 2013-02-25 DIAGNOSIS — G8929 Other chronic pain: Secondary | ICD-10-CM | POA: Diagnosis present

## 2013-02-25 DIAGNOSIS — K219 Gastro-esophageal reflux disease without esophagitis: Secondary | ICD-10-CM | POA: Diagnosis present

## 2013-02-25 LAB — CBC WITH DIFFERENTIAL/PLATELET
Basophils Absolute: 0.1 10*3/uL (ref 0.0–0.1)
Basophils Relative: 1 % (ref 0–1)
Eosinophils Absolute: 0.1 10*3/uL (ref 0.0–0.7)
Eosinophils Relative: 2 % (ref 0–5)
HCT: 43.7 % (ref 36.0–46.0)
Hemoglobin: 14.1 g/dL (ref 12.0–15.0)
Lymphocytes Relative: 24 % (ref 12–46)
Lymphs Abs: 1.8 10*3/uL (ref 0.7–4.0)
MCH: 26.6 pg (ref 26.0–34.0)
MCHC: 32.3 g/dL (ref 30.0–36.0)
MCV: 82.3 fL (ref 78.0–100.0)
Monocytes Absolute: 0.7 10*3/uL (ref 0.1–1.0)
Monocytes Relative: 9 % (ref 3–12)
Neutro Abs: 4.9 10*3/uL (ref 1.7–7.7)
Neutrophils Relative %: 64 % (ref 43–77)
Platelets: 237 10*3/uL (ref 150–400)
RBC: 5.31 MIL/uL — ABNORMAL HIGH (ref 3.87–5.11)
RDW: 16.5 % — ABNORMAL HIGH (ref 11.5–15.5)
WBC: 7.6 10*3/uL (ref 4.0–10.5)

## 2013-02-25 LAB — URINE MICROSCOPIC-ADD ON

## 2013-02-25 LAB — COMPREHENSIVE METABOLIC PANEL
ALT: 13 U/L (ref 0–35)
AST: 19 U/L (ref 0–37)
Albumin: 3.6 g/dL (ref 3.5–5.2)
Alkaline Phosphatase: 93 U/L (ref 39–117)
BUN: 8 mg/dL (ref 6–23)
CO2: 23 mEq/L (ref 19–32)
Calcium: 9.4 mg/dL (ref 8.4–10.5)
Chloride: 105 mEq/L (ref 96–112)
Creatinine, Ser: 0.73 mg/dL (ref 0.50–1.10)
GFR calc Af Amer: >90 mL/min (ref >90)
GFR calc non Af Amer: >90 mL/min (ref >90)
Glucose, Bld: 104 mg/dL — ABNORMAL HIGH (ref 70–99)
Potassium: 3.5 mEq/L (ref 3.5–5.1)
Sodium: 140 mEq/L (ref 135–145)
Total Bilirubin: 0.5 mg/dL (ref 0.3–1.2)
Total Protein: 7.5 g/dL (ref 6.0–8.3)

## 2013-02-25 LAB — URINALYSIS, ROUTINE W REFLEX MICROSCOPIC
Glucose, UA: NEGATIVE mg/dL
Ketones, ur: 15 mg/dL — AB
Nitrite: POSITIVE — AB
Protein, ur: 100 mg/dL — AB
Specific Gravity, Urine: 1.029 (ref 1.005–1.030)
Urobilinogen, UA: 1 mg/dL (ref 0.0–1.0)
pH: 5.5 (ref 5.0–8.0)

## 2013-02-25 LAB — LIPASE, BLOOD: Lipase: 11 U/L (ref 11–59)

## 2013-02-25 LAB — PROTIME-INR
INR: 2.94 — ABNORMAL HIGH (ref 0.00–1.49)
Prothrombin Time: 29.1 seconds — ABNORMAL HIGH (ref 11.6–15.2)

## 2013-02-25 MED ORDER — SODIUM CHLORIDE 0.9 % IV BOLUS (SEPSIS)
1000.0000 mL | Freq: Once | INTRAVENOUS | Status: AC
  Administered 2013-02-25: 1000 mL via INTRAVENOUS

## 2013-02-25 MED ORDER — DIPHENHYDRAMINE HCL 25 MG PO CAPS
25.0000 mg | ORAL_CAPSULE | Freq: Once | ORAL | Status: AC
  Administered 2013-02-25: 25 mg via ORAL
  Filled 2013-02-25: qty 1

## 2013-02-25 MED ORDER — IOHEXOL 300 MG/ML  SOLN
50.0000 mL | Freq: Once | INTRAMUSCULAR | Status: AC | PRN
  Administered 2013-02-25: 50 mL via ORAL

## 2013-02-25 MED ORDER — MORPHINE SULFATE 10 MG/ML IJ SOLN
10.0000 mg | Freq: Once | INTRAMUSCULAR | Status: AC
  Administered 2013-02-25: 10 mg via INTRAVENOUS
  Filled 2013-02-25: qty 1

## 2013-02-25 MED ORDER — DEXTROSE 5 % IV SOLN
1.0000 g | Freq: Once | INTRAVENOUS | Status: AC
  Administered 2013-02-25: 1 g via INTRAVENOUS
  Filled 2013-02-25: qty 10

## 2013-02-25 MED ORDER — IOHEXOL 300 MG/ML  SOLN
100.0000 mL | Freq: Once | INTRAMUSCULAR | Status: AC | PRN
  Administered 2013-02-25: 100 mL via INTRAVENOUS

## 2013-02-25 MED ORDER — IOHEXOL 300 MG/ML  SOLN: 100.0000 mL | Freq: Once | INTRAMUSCULAR | Status: DC | PRN

## 2013-02-25 MED ORDER — ONDANSETRON HCL 4 MG/2ML IJ SOLN
4.0000 mg | Freq: Once | INTRAMUSCULAR | Status: AC
  Administered 2013-02-25: 4 mg via INTRAVENOUS
  Filled 2013-02-25: qty 2

## 2013-02-25 MED ORDER — DEXTROSE 5 % IV SOLN
500.0000 mg | Freq: Once | INTRAVENOUS | Status: AC
  Administered 2013-02-25: 500 mg via INTRAVENOUS
  Filled 2013-02-25: qty 500

## 2013-02-25 NOTE — ED Provider Notes (Signed)
History    57yF with multiple complaints. Generalized fatigue since Thursday. N/v/d. Lower abdominal pain. Does not lateralize. The patient is complaining some upper back pain, but this seems for chronic in nature. Nonproductive cough. No shortness of breath. Denies any chest pain. On Coumadin for history of atrial fibrillation. Subjective fever. Increased urinary frequency, otherwise no urinary complaints.   CSN: [id number]  Arrival date & time 02/25/13  1736   First MD Initiated Contact with Patient 02/25/13 2041      Chief Complaint  Patient presents with  . Emesis  . Diarrhea  . Abdominal Pain  . Headache    (Consider location/radiation/quality/duration/timing/severity/associated sxs/prior treatment) HPI  Past Medical History  Diagnosis Date  . Asthma   . Pulmonary embolism   . High cholesterol   . Acid reflux     Past Surgical History  Procedure Laterality Date  . Replacement total knee    . Cholecystectomy    . Abdominal hysterectomy    . Back surgery    . Joint replacement    . Carpal tunnel release    .  bone spurs removed from feet    . Tubal ligation      Family History  Problem Relation Age of Onset  . Heart failure Mother   . COPD Mother   . Heart failure Father   . COPD Father   . Cancer Other   . Heart failure Other     History  Substance Use Topics  . Smoking status: Never Smoker   . Smokeless tobacco: Not on file  . Alcohol Use: No    OB History   Grav Para Term Preterm Abortions TAB SAB Ect Mult Living                  Review of Systems  All systems reviewed and negative, other than as noted in HPI.   Allergies  Shellfish allergy  Home Medications   Current Outpatient Rx  Name  Route  Sig  Dispense  Refill  . albuterol (PROVENTIL HFA;VENTOLIN HFA) 108 (90 BASE) MCG/ACT inhaler   Inhalation   Inhale 2 puffs into the lungs every 6 (six) hours as needed. For wheezing         . cyclobenzaprine (FLEXERIL) 10 MG tablet    Oral   Take 10 mg by mouth 3 (three) times daily as needed. For muscle spasms         . diazepam (VALIUM) 10 MG tablet   Oral   Take 10 mg by mouth 3 (three) times daily as needed for anxiety.         . diclofenac sodium (VOLTAREN) 1 % GEL   Topical   Apply 2 g topically 4 (four) times daily as needed. For pain (shoulder,knees, back, neck)         . furosemide (LASIX) 40 MG tablet   Oral   Take 40 mg by mouth daily as needed. For fluid retention.         Marland Kitchen isometheptene-acetaminophen-dichloralphenazone (MIDRIN) 65-325-100 MG capsule   Oral   Take 1-2 capsules by mouth 4 (four) times daily as needed. For headaches. Max dose is 5 tabs in 24 hrs. Take 2 tabs at the onset of headache then take 1 tab each hour if needed.         . lidocaine (LIDODERM) 5 %   Transdermal   Place 1-3 patches onto the skin daily. Remove & Discard patch within 12 hours or as directed by  MD         . loperamide (IMODIUM) 2 MG capsule   Oral   Take 2 mg by mouth 4 (four) times daily as needed for diarrhea or loose stools.         Marland Kitchen omeprazole (PRILOSEC) 40 MG capsule   Oral   Take 40 mg by mouth 2 (two) times daily.          Marland Kitchen oxyCODONE-acetaminophen (PERCOCET) 10-325 MG per tablet   Oral   Take 1-2 tablets by mouth every 4 (four) hours as needed for pain (For breakthrough pain). For pain         . oxymorphone (OPANA ER) 30 MG 12 hr tablet   Oral   Take 30 mg by mouth every 12 (twelve) hours.         . Phenylephrine-DM (TRIAMINIC COLD/COUGH DAY TIME) 2.5-5 MG/5ML SYRP   Oral   Take 5 mLs by mouth every 6 (six) hours as needed. For cough.         . promethazine (PHENERGAN) 25 MG tablet   Oral   Take 25 mg by mouth every 6 (six) hours as needed for nausea.         . rosuvastatin (CRESTOR) 5 MG tablet   Oral   Take 5 mg by mouth daily.         Marland Kitchen warfarin (COUMADIN) 5 MG tablet   Oral   Take 2.5-5 mg by mouth daily. Take 2.5 mg on Tues, Thurs, Sat & Sun. Take 5 mg on  Mon, Wed & Fri           BP 110/72  Pulse 92  Temp(Src) 99.7 F (37.6 C) (Oral)  Resp 18  SpO2 95%  Physical Exam  Nursing note and vitals reviewed. Constitutional:  Laying in bed. Uncomfortable appearing.   HENT:  Head: Normocephalic and atraumatic.  Eyes: Conjunctivae are normal. Pupils are equal, round, and reactive to light. Right eye exhibits no discharge. Left eye exhibits no discharge.  Neck: Neck supple.  Cardiovascular: Normal rate, regular rhythm and normal heart sounds.  Exam reveals no gallop and no friction rub.   No murmur heard. Pulmonary/Chest: Effort normal.  RLL rhonchi  Abdominal: Soft. She exhibits no distension. There is tenderness. There is no rebound and no guarding.  Suprapubic and LLQ tenderness w/o rebound/guarding  Musculoskeletal: She exhibits no edema and no tenderness.  Lower extremities symmetric as compared to each other. No calf tenderness. Negative Homan's. No palpable cords.   Neurological: She is alert.  Skin: Skin is warm and dry.  Psychiatric: She has a normal mood and affect. Her behavior is normal. Thought content normal.    ED Course  Procedures (including critical care time)  Labs Reviewed  CBC WITH DIFFERENTIAL - Abnormal; Notable for the following:    RBC 5.31 (*)    RDW 16.5 (*)    All other components within normal limits  COMPREHENSIVE METABOLIC PANEL - Abnormal; Notable for the following:    Glucose, Bld 104 (*)    All other components within normal limits  URINALYSIS, ROUTINE W REFLEX MICROSCOPIC - Abnormal; Notable for the following:    Color, Urine AMBER (*)    APPearance TURBID (*)    Hgb urine dipstick LARGE (*)    Bilirubin Urine SMALL (*)    Ketones, ur 15 (*)    Protein, ur 100 (*)    Nitrite POSITIVE (*)    Leukocytes, UA LARGE (*)    All other  components within normal limits  URINE MICROSCOPIC-ADD ON - Abnormal; Notable for the following:    Squamous Epithelial / LPF FEW (*)    Bacteria, UA MANY (*)     All other components within normal limits  URINE CULTURE  LIPASE, BLOOD  URINALYSIS, MICROSCOPIC ONLY   Dg Chest 2 View  02/25/2013  *RADIOLOGY REPORT*  Clinical Data: 57 year old female with cough and abnormal right lower lobe pulmonary auscultation.  CHEST - 2 VIEW  Comparison: CTA chest 11/16/2011 and earlier.  Findings: Upright AP and lateral views.  Cardiac size at the upper limits of normal to mildly enlarged. Other mediastinal contours are within normal limits.  Visualized tracheal air column is within normal limits.  No pneumothorax, pulmonary edema, pleural effusion or confluent pulmonary opacity.  No acute osseous abnormality identified.  IMPRESSION: No acute cardiopulmonary abnormality.   Original Report Authenticated By: Erskine Speed, M.D.    Ct Abdomen Pelvis W Contrast  02/25/2013  *RADIOLOGY REPORT*  Clinical Data: Emesis, diarrhea, abdominal pain  CT ABDOMEN AND PELVIS WITH CONTRAST  Technique:  Multidetector CT imaging of the abdomen and pelvis was performed following the standard protocol during bolus administration of intravenous contrast.  Contrast:  100 ml Omnipaque  Comparison: CT abdomen 02/17/2012  Findings: There is a fine nodular branching pattern within the right lower lobe which is new from comparison exam.  There is a low density lesion along the most inferior aspect of the right middle lobe which is unchanged from comparison exams likely represents benign lesion (image 32).  Patient status post cholecystectomy. The pancreas, spleen, adrenal glands, and kidneys are normal.  The stomach, small bowel and, and cecum are normal.  The colon demonstrates diverticula of the sigmoid colon without acute inflammation.  Abdominal aorta is normal caliber.  No retroperitoneal or periportal lymphadenopathy.  No free fluid in the pelvis.  Post hysterectomy anatomy.  The bladder is thick-walled and partially collapsed.  No pelvic lymphadenopathy.  Review of bone windows demonstrates a posterior  lumbar fusion at L4- L5.  No complicating features.  The  IMPRESSION: 1.  Fine branching nodular pattern within the right lower lobe suggests acute or chronic infection. 2.  Stable low density lesion inferior aspect the right hepatic lobe is likely benign. 3.  Sigmoid diverticulosis out diverticulitis. 4.  Thickened bladder wall.  Recommend clinical correlation with cystitis/urinary tract infection.   Original Report Authenticated By: Genevive Bi, M.D.      1. CAP (community acquired pneumonia)   2. UTI (urinary tract infection)       MDM  57yF with multiple complaints. UA consistent with UTI. Rocephin ordered. RLL infiltrate and focal rhonchi on exam. Azithromycin added for CAP coverage.         Raeford Razor, MD 02/26/13 [phone]

## 2013-02-25 NOTE — ED Notes (Signed)
Pt states this past Thursday started having n/v/d, then developed a cough, abdominal pain and headache. Pt states last time had vomiting or diarrhea Friday but still having cough, headache and abdominal pain.

## 2013-02-26 DIAGNOSIS — N390 Urinary tract infection, site not specified: Secondary | ICD-10-CM | POA: Diagnosis present

## 2013-02-26 DIAGNOSIS — J189 Pneumonia, unspecified organism: Secondary | ICD-10-CM | POA: Diagnosis present

## 2013-02-26 DIAGNOSIS — E876 Hypokalemia: Secondary | ICD-10-CM | POA: Diagnosis not present

## 2013-02-26 LAB — COMPREHENSIVE METABOLIC PANEL
Albumin: 2.9 g/dL — ABNORMAL LOW (ref 3.5–5.2)
BUN: 6 mg/dL (ref 6–23)
Calcium: 8.7 mg/dL (ref 8.4–10.5)
Creatinine, Ser: 0.66 mg/dL (ref 0.50–1.10)
Potassium: 3.1 mEq/L — ABNORMAL LOW (ref 3.5–5.1)
Total Protein: 6.4 g/dL (ref 6.0–8.3)

## 2013-02-26 LAB — PROTIME-INR
INR: 2.76 — ABNORMAL HIGH (ref 0.00–1.49)
Prothrombin Time: 27.8 seconds — ABNORMAL HIGH (ref 11.6–15.2)

## 2013-02-26 MED ORDER — PANTOPRAZOLE SODIUM 40 MG PO TBEC
80.0000 mg | DELAYED_RELEASE_TABLET | Freq: Every day | ORAL | Status: DC
  Administered 2013-02-26 – 2013-02-28 (×3): 80 mg via ORAL
  Filled 2013-02-26 (×3): qty 2

## 2013-02-26 MED ORDER — ATORVASTATIN CALCIUM 10 MG PO TABS
10.0000 mg | ORAL_TABLET | Freq: Every day | ORAL | Status: DC
  Administered 2013-02-26 – 2013-02-27 (×2): 10 mg via ORAL
  Filled 2013-02-26 (×3): qty 1

## 2013-02-26 MED ORDER — HEPARIN SODIUM (PORCINE) 5000 UNIT/ML IJ SOLN: 5000.0000 [IU] | Freq: Three times a day (TID) | INTRAMUSCULAR | Status: DC

## 2013-02-26 MED ORDER — POTASSIUM CHLORIDE CRYS ER 20 MEQ PO TBCR
40.0000 meq | EXTENDED_RELEASE_TABLET | Freq: Once | ORAL | Status: AC
  Administered 2013-02-26: 40 meq via ORAL
  Filled 2013-02-26: qty 2

## 2013-02-26 MED ORDER — MORPHINE SULFATE ER 30 MG PO TBCR
60.0000 mg | EXTENDED_RELEASE_TABLET | Freq: Two times a day (BID) | ORAL | Status: DC
  Administered 2013-02-26 – 2013-02-28 (×6): 60 mg via ORAL
  Filled 2013-02-26 (×6): qty 2

## 2013-02-26 MED ORDER — LOPERAMIDE HCL 2 MG PO CAPS: 2.0000 mg | ORAL_CAPSULE | Freq: Four times a day (QID) | ORAL | Status: DC | PRN

## 2013-02-26 MED ORDER — SODIUM CHLORIDE 0.9 % IJ SOLN
3.0000 mL | Freq: Two times a day (BID) | INTRAMUSCULAR | Status: DC
  Administered 2013-02-26 – 2013-02-28 (×5): 3 mL via INTRAVENOUS

## 2013-02-26 MED ORDER — LIDOCAINE 5 % EX PTCH
1.0000 | MEDICATED_PATCH | CUTANEOUS | Status: DC
  Administered 2013-02-26 – 2013-02-28 (×3): 2 via TRANSDERMAL
  Filled 2013-02-26 (×3): qty 3

## 2013-02-26 MED ORDER — ALBUTEROL SULFATE HFA 108 (90 BASE) MCG/ACT IN AERS
2.0000 | INHALATION_SPRAY | Freq: Four times a day (QID) | RESPIRATORY_TRACT | Status: DC | PRN
  Administered 2013-02-27: 2 via RESPIRATORY_TRACT
  Filled 2013-02-26: qty 6.7

## 2013-02-26 MED ORDER — OXYCODONE HCL 5 MG PO TABS
5.0000 mg | ORAL_TABLET | ORAL | Status: DC | PRN
  Administered 2013-02-26 (×2): 5 mg via ORAL
  Administered 2013-02-26: 10 mg via ORAL
  Administered 2013-02-27 – 2013-02-28 (×6): 5 mg via ORAL
  Filled 2013-02-26 (×3): qty 1
  Filled 2013-02-26: qty 2
  Filled 2013-02-26 (×2): qty 1
  Filled 2013-02-26: qty 2
  Filled 2013-02-26 (×2): qty 1

## 2013-02-26 MED ORDER — ALBUTEROL SULFATE (5 MG/ML) 0.5% IN NEBU
INHALATION_SOLUTION | RESPIRATORY_TRACT | Status: AC
  Administered 2013-02-26: 2.5 mg
  Filled 2013-02-26: qty 0.5

## 2013-02-26 MED ORDER — OXYCODONE-ACETAMINOPHEN 5-325 MG PO TABS
1.0000 | ORAL_TABLET | ORAL | Status: DC | PRN
  Administered 2013-02-26: 1 via ORAL
  Administered 2013-02-26: 2 via ORAL
  Administered 2013-02-26 – 2013-02-28 (×7): 1 via ORAL
  Filled 2013-02-26 (×6): qty 1
  Filled 2013-02-26: qty 2
  Filled 2013-02-26 (×2): qty 1

## 2013-02-26 MED ORDER — ONDANSETRON HCL 4 MG/2ML IJ SOLN
4.0000 mg | Freq: Four times a day (QID) | INTRAMUSCULAR | Status: DC | PRN
  Administered 2013-02-26 – 2013-02-27 (×3): 4 mg via INTRAVENOUS
  Filled 2013-02-26 (×3): qty 2

## 2013-02-26 MED ORDER — WARFARIN - PHARMACIST DOSING INPATIENT: Freq: Every day | Status: DC

## 2013-02-26 MED ORDER — WARFARIN SODIUM 5 MG PO TABS
5.0000 mg | ORAL_TABLET | ORAL | Status: DC
  Filled 2013-02-26: qty 1

## 2013-02-26 MED ORDER — DIAZEPAM 5 MG PO TABS: 10.0000 mg | ORAL_TABLET | Freq: Three times a day (TID) | ORAL | Status: DC | PRN

## 2013-02-26 MED ORDER — SODIUM CHLORIDE 0.9 % IV SOLN
INTRAVENOUS | Status: DC
  Administered 2013-02-26: 1000 mL via INTRAVENOUS

## 2013-02-26 MED ORDER — DEXTROSE 5 % IV SOLN
1.0000 g | INTRAVENOUS | Status: DC
  Administered 2013-02-26 – 2013-02-27 (×2): 1 g via INTRAVENOUS
  Filled 2013-02-26 (×3): qty 10

## 2013-02-26 MED ORDER — OXYCODONE-ACETAMINOPHEN 10-325 MG PO TABS: 1.0000 | ORAL_TABLET | ORAL | Status: DC | PRN

## 2013-02-26 MED ORDER — SODIUM CHLORIDE 0.9 % IV SOLN
INTRAVENOUS | Status: AC
  Administered 2013-02-26: 17:00:00 via INTRAVENOUS

## 2013-02-26 MED ORDER — DEXTROSE 5 % IV SOLN
250.0000 mg | Freq: Every day | INTRAVENOUS | Status: DC
  Administered 2013-02-26 – 2013-02-27 (×2): 250 mg via INTRAVENOUS
  Filled 2013-02-26 (×3): qty 250

## 2013-02-26 MED ORDER — WARFARIN SODIUM 2.5 MG PO TABS
2.5000 mg | ORAL_TABLET | ORAL | Status: DC
  Administered 2013-02-26: 2.5 mg via ORAL
  Filled 2013-02-26: qty 1

## 2013-02-26 MED ORDER — PROMETHAZINE HCL 25 MG PO TABS
25.0000 mg | ORAL_TABLET | Freq: Four times a day (QID) | ORAL | Status: DC | PRN
  Administered 2013-02-26: 25 mg via ORAL
  Filled 2013-02-26: qty 1

## 2013-02-26 MED ORDER — CYCLOBENZAPRINE HCL 10 MG PO TABS
10.0000 mg | ORAL_TABLET | Freq: Three times a day (TID) | ORAL | Status: DC | PRN
  Filled 2013-02-26: qty 1

## 2013-02-26 NOTE — H&P (Signed)
Hospitalist Admission History and Physical  Patient name: [name] Medical record number: [id number] Date of birth: January 09, 1956 Age: 57 y.o. Gender: female  Primary Care Provider: Thayer Headings, MD  Chief Complaint: generalized malaise, chills, nausea, diarrhea  History of Present Illness:This is a 57 y.o. year old female with significant past medical history of obesity, asthma, chronic pain, GERD presenting with UTI and CAP. Pt states that she has had generalized malaise, cough, abdominal pain, nausea, diarrhea over last 4-5 days. Pt states that sxs have progressed over this time period. + chills at home. No documented fever. Cough non productive. Had some secondary mild chest pain without radiation No hx/o CAD/MI. Abdominal pain generalized without focality.This is also chronic.  Also with mild flank pain. Diarrhea watery. + dysuria over this same time frame. No hematuria. Pt went to PCP with issues today and was referred to ER.  In ER, UA indicative of UTI. CT Abd and Pelvis showed RLL PNA and bladder inflammation. No recent hospitalization over last 3 months. No WBC or fever on presentation.   Patient Active Problem List   Diagnosis Date Noted  . Chest pain 11/16/2011  . Personal history of PE (pulmonary embolism) 11/16/2011  . Asthma 11/16/2011  . GERD (gastroesophageal reflux disease) 11/16/2011  . Hyperlipidemia 11/16/2011   Past Medical History: Past Medical History  Diagnosis Date  . Asthma   . Pulmonary embolism   . High cholesterol   . Acid reflux     Past Surgical History: Past Surgical History  Procedure Laterality Date  . Replacement total knee    . Cholecystectomy    . Abdominal hysterectomy    . Back surgery    . Joint replacement    . Carpal tunnel release    .  bone spurs removed from feet    . Tubal ligation      Social History: History   Social History  . Marital Status: Married    Spouse Name: N/A    Number of Children: N/A  . Years of  Education: N/A   Social History Main Topics  . Smoking status: Never Smoker   . Smokeless tobacco: None  . Alcohol Use: No  . Drug Use: No  . Sexually Active: No   Other Topics Concern  . None   Social History Narrative  . None    Family History: Family History  Problem Relation Age of Onset  . Heart failure Mother   . COPD Mother   . Heart failure Father   . COPD Father   . Cancer Other   . Heart failure Other     Allergies: Allergies  Allergen Reactions  . Shellfish Allergy Hives and Nausea And Vomiting    Current Facility-Administered Medications  Medication Dose Route Frequency Provider Last Rate Last Dose  . 0.9 %  sodium chloride infusion   Intravenous Continuous Doree Albee, MD      . albuterol (PROVENTIL HFA;VENTOLIN HFA) 108 (90 BASE) MCG/ACT inhaler 2 puff  2 puff Inhalation Q6H PRN Doree Albee, MD      . atorvastatin (LIPITOR) tablet 10 mg  10 mg Oral q1800 Doree Albee, MD      . azithromycin (ZITHROMAX) 250 mg in dextrose 5 % 125 mL IVPB  250 mg Intravenous Q24H Doree Albee, MD      . azithromycin (ZITHROMAX) 500 mg in dextrose 5 % 250 mL IVPB  500 mg Intravenous Once Raeford Razor, MD 250 mL/hr at 02/25/13 2339 500 mg at 02/25/13 2339  . [  START ON 02/27/2013] cefTRIAXone (ROCEPHIN) 1 g in dextrose 5 % 50 mL IVPB  1 g Intravenous Q24H Doree Albee, MD      . cyclobenzaprine (FLEXERIL) tablet 10 mg  10 mg Oral TID PRN Doree Albee, MD      . diazepam (VALIUM) tablet 10 mg  10 mg Oral TID PRN Doree Albee, MD      . heparin injection 5,000 Units  5,000 Units Subcutaneous Q8H Doree Albee, MD      . lidocaine (LIDODERM) 5 % 1-3 patch  1-3 patch Transdermal Q24H Doree Albee, MD      . loperamide (IMODIUM) capsule 2 mg  2 mg Oral QID PRN Doree Albee, MD      . morphine (MS CONTIN) 12 hr tablet 60 mg  60 mg Oral Q12H Doree Albee, MD      . oxyCODONE-acetaminophen (PERCOCET) 10-325 MG per tablet 1-2 tablet  1-2 tablet Oral Q4H PRN Doree Albee, MD       . pantoprazole (PROTONIX) EC tablet 80 mg  80 mg Oral Daily Doree Albee, MD      . promethazine (PHENERGAN) tablet 25 mg  25 mg Oral Q6H PRN Doree Albee, MD      . sodium chloride 0.9 % injection 3 mL  3 mL Intravenous Q12H Doree Albee, MD       Current Outpatient Prescriptions  Medication Sig Dispense Refill  . albuterol (PROVENTIL HFA;VENTOLIN HFA) 108 (90 BASE) MCG/ACT inhaler Inhale 2 puffs into the lungs every 6 (six) hours as needed. For wheezing      . cyclobenzaprine (FLEXERIL) 10 MG tablet Take 10 mg by mouth 3 (three) times daily as needed. For muscle spasms      . diazepam (VALIUM) 10 MG tablet Take 10 mg by mouth 3 (three) times daily as needed for anxiety.      . diclofenac sodium (VOLTAREN) 1 % GEL Apply 2 g topically 4 (four) times daily as needed. For pain (shoulder,knees, back, neck)      . furosemide (LASIX) 40 MG tablet Take 40 mg by mouth daily as needed. For fluid retention.      Marland Kitchen isometheptene-acetaminophen-dichloralphenazone (MIDRIN) 65-325-100 MG capsule Take 1-2 capsules by mouth 4 (four) times daily as needed. For headaches. Max dose is 5 tabs in 24 hrs. Take 2 tabs at the onset of headache then take 1 tab each hour if needed.      . lidocaine (LIDODERM) 5 % Place 1-3 patches onto the skin daily. Remove & Discard patch within 12 hours or as directed by MD      . loperamide (IMODIUM) 2 MG capsule Take 2 mg by mouth 4 (four) times daily as needed for diarrhea or loose stools.      Marland Kitchen omeprazole (PRILOSEC) 40 MG capsule Take 40 mg by mouth 2 (two) times daily.       Marland Kitchen oxyCODONE-acetaminophen (PERCOCET) 10-325 MG per tablet Take 1-2 tablets by mouth every 4 (four) hours as needed for pain (For breakthrough pain). For pain      . oxymorphone (OPANA ER) 30 MG 12 hr tablet Take 30 mg by mouth every 12 (twelve) hours.      . Phenylephrine-DM (TRIAMINIC COLD/COUGH DAY TIME) 2.5-5 MG/5ML SYRP Take 5 mLs by mouth every 6 (six) hours as needed. For cough.      .  promethazine (PHENERGAN) 25 MG tablet Take 25 mg by mouth every 6 (six) hours as needed for nausea.      Marland Kitchen  rosuvastatin (CRESTOR) 5 MG tablet Take 5 mg by mouth daily.      Marland Kitchen warfarin (COUMADIN) 5 MG tablet Take 2.5-5 mg by mouth daily. Take 2.5 mg on Tues, Thurs, Sat & Sun. Take 5 mg on Mon, Wed & Fri       Review Of Systems: 12 point ROS negative except as noted above in HPI.  Physical Exam: Filed Vitals:   02/25/13 1807  BP: 110/72  Pulse: 92  Temp: 99.7 F (37.6 C)  Resp: 18    General: morbidly obese, NAD  HEENT: PERRLA, extra ocular movement intact and trachea midline, dry oral mucosa Heart: S1, S2 normal, no murmur, rub or gallop, regular rate and rhythm Lungs: clear to auscultation and no wheezes or rales Abdomen: obese, + TTP diffusely, mild bilateral flank pain  Extremities: extremities normal, atraumatic, no cyanosis or edema Skin:no rashes, no ecchymoses Neurology: normal without focal findings  Labs and Imaging: Lab Results  Component Value Date/Time   NA 140 02/25/2013  7:04 PM   K 3.5 02/25/2013  7:04 PM   CL 105 02/25/2013  7:04 PM   CO2 23 02/25/2013  7:04 PM   BUN 8 02/25/2013  7:04 PM   CREATININE 0.73 02/25/2013  7:04 PM   GLUCOSE 104* 02/25/2013  7:04 PM   Lab Results  Component Value Date   WBC 7.6 02/25/2013   HGB 14.1 02/25/2013   HCT 43.7 02/25/2013   MCV 82.3 02/25/2013   PLT 237 02/25/2013   Urinalysis    Component Value Date/Time   COLORURINE AMBER* 02/25/2013 1957   APPEARANCEUR TURBID* 02/25/2013 1957   LABSPEC 1.029 02/25/2013 1957   PHURINE 5.5 02/25/2013 1957   GLUCOSEU NEGATIVE 02/25/2013 1957   HGBUR LARGE* 02/25/2013 1957   BILIRUBINUR SMALL* 02/25/2013 1957   KETONESUR 15* 02/25/2013 1957   PROTEINUR 100* 02/25/2013 1957   UROBILINOGEN 1.0 02/25/2013 1957   NITRITE POSITIVE* 02/25/2013 1957   LEUKOCYTESUR LARGE* 02/25/2013 1957       Dg Chest 2 View  02/25/2013  *RADIOLOGY REPORT*  Clinical Data: 57 year old female with cough and abnormal right lower lobe pulmonary  auscultation.  CHEST - 2 VIEW  Comparison: CTA chest 11/16/2011 and earlier.  Findings: Upright AP and lateral views.  Cardiac size at the upper limits of normal to mildly enlarged. Other mediastinal contours are within normal limits.  Visualized tracheal air column is within normal limits.  No pneumothorax, pulmonary edema, pleural effusion or confluent pulmonary opacity.  No acute osseous abnormality identified.  IMPRESSION: No acute cardiopulmonary abnormality.   Original Report Authenticated By: Erskine Speed, M.D.    Ct Abdomen Pelvis W Contrast  02/25/2013  *RADIOLOGY REPORT*  Clinical Data: Emesis, diarrhea, abdominal pain  CT ABDOMEN AND PELVIS WITH CONTRAST  Technique:  Multidetector CT imaging of the abdomen and pelvis was performed following the standard protocol during bolus administration of intravenous contrast.  Contrast:  100 ml Omnipaque  Comparison: CT abdomen 02/17/2012  Findings: There is a fine nodular branching pattern within the right lower lobe which is new from comparison exam.  There is a low density lesion along the most inferior aspect of the right middle lobe which is unchanged from comparison exams likely represents benign lesion (image 32).  Patient status post cholecystectomy. The pancreas, spleen, adrenal glands, and kidneys are normal.  The stomach, small bowel and, and cecum are normal.  The colon demonstrates diverticula of the sigmoid colon without acute inflammation.  Abdominal aorta is normal caliber.  No retroperitoneal or periportal lymphadenopathy.  No free fluid in the pelvis.  Post hysterectomy anatomy.  The bladder is thick-walled and partially collapsed.  No pelvic lymphadenopathy.  Review of bone windows demonstrates a posterior lumbar fusion at L4- L5.  No complicating features.  The  IMPRESSION: 1.  Fine branching nodular pattern within the right lower lobe suggests acute or chronic infection. 2.  Stable low density lesion inferior aspect the right hepatic lobe is  likely benign. 3.  Sigmoid diverticulosis out diverticulitis. 4.  Thickened bladder wall.  Recommend clinical correlation with cystitis/urinary tract infection.   Original Report Authenticated By: Genevive Bi, M.D.      Assessment and Plan: [name] is a 57 y.o. year old female presenting with CAP and UTI   CAP/UTI: Will place on rocephin and azithro for CAP treatment. Should also give good coverage for UTI. Urine cx. Urine strep and legionella. Sputum culture. I/S. Blood cultures.   Asthma: clinically asymptomatic currently. Satting well on RA with no wheeznig. Continue home prn albuterol.   Chronic Pain: high dose narcotics at baseline. Suspect abdominal pain today is acute on chronic issue. Continue home narcotic regimen while in house.   Hx/o recurrent PE: No signs of PE recurrence or bleeding. Coumadin per pharmacy.   FEN/GI: heart healthy diet. PPI Prophylaxis: coumadin  Disposition: pending further evaluation.  Code Status:Full Code.        Doree Albee MD  Pager: [phone]
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
## 2013-02-26 NOTE — Progress Notes (Signed)
ANTICOAGULATION CONSULT NOTE - Initial Consult  Pharmacy Consult for warfarin Indication: pulmonary embolus  Allergies  Allergen Reactions  . Shellfish Allergy Hives and Nausea And Vomiting    Patient Measurements:   Heparin Dosing Weight:   Vital Signs: Temp: 98.8 F (37.1 C) (05/06 0049) Temp src: Oral (05/06 0049) BP: 110/58 mmHg (05/06 0049) Pulse Rate: 89 (05/06 0049)  Labs:  Recent Labs  02/25/13 1904 02/25/13 2331  HGB 14.1  --   HCT 43.7  --   PLT 237  --   LABPROT  --  29.1*  INR  --  2.94*  CREATININE 0.73  --     The CrCl is unknown because both a height and weight (above a minimum accepted value) are required for this calculation.   Medical History: Past Medical History  Diagnosis Date  . Asthma   . Pulmonary embolism   . High cholesterol   . Acid reflux     Medications:   (Not in a hospital admission) Scheduled:  . atorvastatin  10 mg Oral q1800  . [COMPLETED] diphenhydrAMINE  25 mg Oral Once  . lidocaine  1-3 patch Transdermal Q24H  . morphine  60 mg Oral Q12H  . [COMPLETED]  morphine injection  10 mg Intravenous Once  . [COMPLETED] ondansetron (ZOFRAN) IV  4 mg Intravenous Once  . pantoprazole  80 mg Oral Daily  . sodium chloride  3 mL Intravenous Q12H  . warfarin  2.5 mg Oral Custom  . [START ON 02/27/2013] warfarin  5 mg Oral Custom  . Warfarin - Pharmacist Dosing Inpatient   Does not apply q1800  . [DISCONTINUED] heparin  5,000 Units Subcutaneous Q8H    Assessment: Patient on chronic warfarin for PE.  INR at goal.  Goal of Therapy:  INR 2-3    Plan:  Continue with home dose of warfarin, daily INR  Darlina Guys, Jacquenette Shone Crowford 02/26/2013,1:38 AM

## 2013-02-26 NOTE — ED Notes (Signed)
Called to give report nurse unavailable will call back.  

## 2013-02-26 NOTE — Progress Notes (Addendum)
Pt admitted from the ED. Pt ambulated to bathroom. Vital signs stable. Pt orientated to the unit and pt safety video viewed. Pt had no questions or concerns at this time. MD orders initiated.

## 2013-02-26 NOTE — Progress Notes (Addendum)
[facility] PROGRESS NOTE  [name] ZOX:[id number] DOB: 1955/12/17 DOA: 02/25/2013 PCP: Thayer Headings, MD  Brief narrative 57 year old female with PMH of asthma, GERD, hyperlipidemia, pulmonary embolism on Coumadin was admitted on 02/26/13 with complaints of generalized malaise, chills, nausea and diarrhea. She was referred by her PCP to the ED. In ED, UA suggestive of UTI, CT abdomen and pelvis showed RLL pneumonia and bladder inflammation. Hospitalist admission was requested.   Assessment/Plan: 1. Community acquired pneumonia/right lower lobe: Patient clinically feels better. Cough has diminished. No chest pain. Continue Rocephin and azithromycin. 2. Urinary tract infection: Continue Rocephin pending cultures. 3. Hypokalemia: Likely secondary to nausea, vomiting, diarrhea and poor oral intake. Replete and follow BMP. 4. Nausea, vomiting and diarrhea: Since Thursday. Improved. No further vomiting or diarrhea since Friday. Still intermittently nauseous.? Acute viral GE-improving. Advance diet as tolerated. 5. Asthma: No clinical bronchospasm. Stable. 6. 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 Coumadin per pharmacy. INR: 2.76 7. Chronic pain (low back, knees and shoulders): On chronic pain medications.  Code Status: Full Family Communication: Discussed with patient Disposition Plan: Home when medically stable.   Consultants:  None  Procedures:  None  Antibiotics:  IV Rocephin 5/6 >  IV azithromycin 5/6 >   HPI/Subjective: Overall feels better. Decreasing cough. No chest pain. Diarrhea better.  Objective: Filed Vitals:   02/26/13 0500 02/26/13 0528 02/26/13 1458 02/26/13 1544  BP:  106/65  118/57  Pulse:  76  80  Temp:  98.2 F (36.8 C)  97.9 F (36.6 C)  TempSrc:  Oral  Tympanic  Resp:  16  19  Height:      Weight: 101.6 kg (223 lb 15.8 oz)     SpO2:  97% 99% 98%    Intake/Output Summary (Last 24 hours) at 02/26/13 1609 Last data filed  at 02/26/13 1545  Gross per 24 hour  Intake 441.25 ml  Output      0 ml  Net 441.25 ml   Filed Weights   02/26/13 0227 02/26/13 0500  Weight: 98.884 kg (218 lb) 101.6 kg (223 lb 15.8 oz)    Exam:   General exam: Obese female lying comfortably supine on bed.  Respiratory system: Reduced breath sounds in bases with occasional crackles. No wheezing or rhonchi. Rest of lung fields clear to auscultation. No increased work of breathing.  Cardiovascular system: S1 & S2 heard, RRR. No JVD, murmurs, gallops, clicks or pedal edema. Telemetry: Sinus rhythm.  Gastrointestinal system: Abdomen is nondistended, soft and nontender. Normal bowel sounds heard.  Central nervous system: Alert and oriented. No focal neurological deficits.  Extremities: Symmetric 5 x 5 power.   Data Reviewed: Basic Metabolic Panel:  Recent Labs Lab 02/25/13 1904 02/26/13 0510  NA 140 140  K 3.5 3.1*  CL 105 106  CO2 23 25  GLUCOSE 104* 139*  BUN 8 6  CREATININE 0.73 0.66  CALCIUM 9.4 8.7   Liver Function Tests:  Recent Labs Lab 02/25/13 1904 02/26/13 0510  AST 19 17  ALT 13 11  ALKPHOS 93 76  BILITOT 0.5 0.3  PROT 7.5 6.4  ALBUMIN 3.6 2.9*    Recent Labs Lab 02/25/13 1904  LIPASE 11   No results found for this basename: AMMONIA,  in the last 168 hours CBC:  Recent Labs Lab 02/25/13 1904  WBC 7.6  NEUTROABS 4.9  HGB 14.1  HCT 43.7  MCV 82.3  PLT 237   Cardiac Enzymes: No results found for this basename: CKTOTAL, CKMB, CKMBINDEX,  TROPONINI,  in the last 168 hours BNP (last 3 results) No results found for this basename: PROBNP,  in the last 8760 hours CBG: No results found for this basename: GLUCAP,  in the last 168 hours  No results found for this or any previous visit (from the past 240 hour(s)).   Studies: Dg Chest 2 View  02/25/2013  *RADIOLOGY REPORT*  Clinical Data: 57 year old female with cough and abnormal right lower lobe pulmonary auscultation.  CHEST - 2 VIEW   Comparison: CTA chest 11/16/2011 and earlier.  Findings: Upright AP and lateral views.  Cardiac size at the upper limits of normal to mildly enlarged. Other mediastinal contours are within normal limits.  Visualized tracheal air column is within normal limits.  No pneumothorax, pulmonary edema, pleural effusion or confluent pulmonary opacity.  No acute osseous abnormality identified.  IMPRESSION: No acute cardiopulmonary abnormality.   Original Report Authenticated By: Erskine Speed, M.D.    Ct Abdomen Pelvis W Contrast  02/25/2013  *RADIOLOGY REPORT*  Clinical Data: Emesis, diarrhea, abdominal pain  CT ABDOMEN AND PELVIS WITH CONTRAST  Technique:  Multidetector CT imaging of the abdomen and pelvis was performed following the standard protocol during bolus administration of intravenous contrast.  Contrast:  100 ml Omnipaque  Comparison: CT abdomen 02/17/2012  Findings: There is a fine nodular branching pattern within the right lower lobe which is new from comparison exam.  There is a low density lesion along the most inferior aspect of the right middle lobe which is unchanged from comparison exams likely represents benign lesion (image 32).  Patient status post cholecystectomy. The pancreas, spleen, adrenal glands, and kidneys are normal.  The stomach, small bowel and, and cecum are normal.  The colon demonstrates diverticula of the sigmoid colon without acute inflammation.  Abdominal aorta is normal caliber.  No retroperitoneal or periportal lymphadenopathy.  No free fluid in the pelvis.  Post hysterectomy anatomy.  The bladder is thick-walled and partially collapsed.  No pelvic lymphadenopathy.  Review of bone windows demonstrates a posterior lumbar fusion at L4- L5.  No complicating features.  The  IMPRESSION: 1.  Fine branching nodular pattern within the right lower lobe suggests acute or chronic infection. 2.  Stable low density lesion inferior aspect the right hepatic lobe is likely benign. 3.  Sigmoid  diverticulosis out diverticulitis. 4.  Thickened bladder wall.  Recommend clinical correlation with cystitis/urinary tract infection.   Original Report Authenticated By: Genevive Bi, M.D.      Additional labs:   Scheduled Meds: . atorvastatin  10 mg Oral q1800  . azithromycin  250 mg Intravenous QHS  . cefTRIAXone (ROCEPHIN)  IV  1 g Intravenous Q24H  . lidocaine  1-3 patch Transdermal Q24H  . morphine  60 mg Oral Q12H  . pantoprazole  80 mg Oral Daily  . sodium chloride  3 mL Intravenous Q12H  . warfarin  2.5 mg Oral Custom  . [START ON 02/27/2013] warfarin  5 mg Oral Custom  . Warfarin - Pharmacist Dosing Inpatient   Does not apply q1800   Continuous Infusions: . sodium chloride 1,000 mL (02/26/13 0247)    Active Problems:   * No active hospital problems. *    Time spent: 30 minutes    [facility]  [facility] Pager [phone].   If 8PM-8AM, please contact night-coverage at [email or web address], password [facility] 02/26/2013, 4:09 PM  LOS: 1 day

## 2013-02-26 NOTE — Progress Notes (Signed)
ANTICOAGULATION CONSULT NOTE - Follow Up Consult  Pharmacy Consult for Warfarin Indication: History of PE  Allergies  Allergen Reactions  . Shellfish Allergy Hives and Nausea And Vomiting    Patient Measurements: Height: 5\' 3"  (160 cm) Weight: 223 lb 15.8 oz (101.6 kg) IBW/kg (Calculated) : 52.4  Vital Signs: Temp: 98.2 F (36.8 C) (05/06 0528) Temp src: Oral (05/06 0528) BP: 106/65 mmHg (05/06 0528) Pulse Rate: 76 (05/06 0528)  Labs:  Recent Labs  02/25/13 1904 02/25/13 2331 02/26/13 0510  HGB 14.1  --   --   HCT 43.7  --   --   PLT 237  --   --   LABPROT  --  29.1* 27.8*  INR  --  2.94* 2.76*  CREATININE 0.73  --  0.66    Estimated Creatinine Clearance: 88.3 ml/min (by C-G formula based on Cr of 0.66).   Medications:  Scheduled:  . atorvastatin  10 mg Oral q1800  . azithromycin  250 mg Intravenous QHS  . [COMPLETED] azithromycin (ZITHROMAX) 500 MG IVPB  500 mg Intravenous Once  . [COMPLETED] cefTRIAXone (ROCEPHIN)  IV  1 g Intravenous Once  . cefTRIAXone (ROCEPHIN)  IV  1 g Intravenous Q24H  . [COMPLETED] diphenhydrAMINE  25 mg Oral Once  . lidocaine  1-3 patch Transdermal Q24H  . morphine  60 mg Oral Q12H  . [COMPLETED]  morphine injection  10 mg Intravenous Once  . [COMPLETED] ondansetron (ZOFRAN) IV  4 mg Intravenous Once  . pantoprazole  80 mg Oral Daily  . [COMPLETED] sodium chloride  1,000 mL Intravenous Once  . sodium chloride  3 mL Intravenous Q12H  . warfarin  2.5 mg Oral Custom  . [START ON 02/27/2013] warfarin  5 mg Oral Custom  . Warfarin - Pharmacist Dosing Inpatient   Does not apply q1800  . [DISCONTINUED] heparin  5,000 Units Subcutaneous Q8H   Infusions:  . sodium chloride 1,000 mL (02/26/13 0247)    Assessment: 57 yo female on chronic warfarin for history of PE presents with UTI and CAP.  Home dose reported as 2.5mg  daily except 5mg  MWF.  INR therapeutic today 2.76  CBC stable, no bleeding/complications reported.  Potential  drug interaction with azithromycin noted - may potentiate warfarin effects and increase INR.  Goal of Therapy:  INR 2-3   Plan:   Warfarin 2.5mg  today per home dose  Daily PT/INR  Loralee Pacas, PharmD, BCPS Pager: [phone] 02/26/2013,11:43 AM

## 2013-02-27 ENCOUNTER — Inpatient Hospital Stay (HOSPITAL_COMMUNITY): Payer: Medicare Other

## 2013-02-27 DIAGNOSIS — J45901 Unspecified asthma with (acute) exacerbation: Secondary | ICD-10-CM

## 2013-02-27 DIAGNOSIS — N390 Urinary tract infection, site not specified: Secondary | ICD-10-CM

## 2013-02-27 DIAGNOSIS — B370 Candidal stomatitis: Secondary | ICD-10-CM

## 2013-02-27 DIAGNOSIS — J189 Pneumonia, unspecified organism: Principal | ICD-10-CM

## 2013-02-27 DIAGNOSIS — Z86711 Personal history of pulmonary embolism: Secondary | ICD-10-CM

## 2013-02-27 DIAGNOSIS — K219 Gastro-esophageal reflux disease without esophagitis: Secondary | ICD-10-CM

## 2013-02-27 LAB — BASIC METABOLIC PANEL
Calcium: 8.5 mg/dL (ref 8.4–10.5)
GFR calc non Af Amer: >90 mL/min (ref >90)
Sodium: 142 mEq/L (ref 135–145)

## 2013-02-27 LAB — CBC WITH DIFFERENTIAL/PLATELET
Basophils Absolute: 0 10*3/uL (ref 0.0–0.1)
Basophils Relative: 1 % (ref 0–1)
Eosinophils Relative: 4 % (ref 0–5)
Lymphocytes Relative: 44 % (ref 12–46)
MCHC: 30.3 g/dL (ref 30.0–36.0)
MCV: 84 fL (ref 78.0–100.0)
Neutro Abs: 2.4 10*3/uL (ref 1.7–7.7)
Platelets: 189 10*3/uL (ref 150–400)
RDW: 16.9 % — ABNORMAL HIGH (ref 11.5–15.5)
WBC: 5.4 10*3/uL (ref 4.0–10.5)

## 2013-02-27 LAB — STREP PNEUMONIAE URINARY ANTIGEN: Strep Pneumo Urinary Antigen: NEGATIVE

## 2013-02-27 LAB — URINE CULTURE: Colony Count: >=100000

## 2013-02-27 LAB — PROTIME-INR
INR: 3.44 — ABNORMAL HIGH (ref 0.00–1.49)
Prothrombin Time: 32.7 seconds — ABNORMAL HIGH (ref 11.6–15.2)

## 2013-02-27 LAB — LEGIONELLA ANTIGEN, URINE: Legionella Antigen, Urine: NEGATIVE

## 2013-02-27 MED ORDER — PREDNISONE 50 MG PO TABS
60.0000 mg | ORAL_TABLET | Freq: Every day | ORAL | Status: DC
  Administered 2013-02-27 – 2013-02-28 (×2): 60 mg via ORAL
  Filled 2013-02-27 (×4): qty 1

## 2013-02-27 MED ORDER — ALBUTEROL SULFATE (5 MG/ML) 0.5% IN NEBU: 2.5000 mg | INHALATION_SOLUTION | RESPIRATORY_TRACT | Status: DC | PRN

## 2013-02-27 MED ORDER — IPRATROPIUM BROMIDE 0.02 % IN SOLN
0.5000 mg | Freq: Three times a day (TID) | RESPIRATORY_TRACT | Status: DC
  Administered 2013-02-28: 0.5 mg via RESPIRATORY_TRACT
  Filled 2013-02-27: qty 2.5

## 2013-02-27 MED ORDER — ALBUTEROL SULFATE (5 MG/ML) 0.5% IN NEBU
2.5000 mg | INHALATION_SOLUTION | Freq: Three times a day (TID) | RESPIRATORY_TRACT | Status: DC
  Administered 2013-02-28: 2.5 mg via RESPIRATORY_TRACT
  Filled 2013-02-27: qty 0.5

## 2013-02-27 MED ORDER — BENZONATATE 100 MG PO CAPS
100.0000 mg | ORAL_CAPSULE | Freq: Three times a day (TID) | ORAL | Status: DC
  Administered 2013-02-27 – 2013-02-28 (×4): 100 mg via ORAL
  Filled 2013-02-27 (×9): qty 1

## 2013-02-27 MED ORDER — GUAIFENESIN-CODEINE 100-10 MG/5ML PO SOLN
10.0000 mL | Freq: Four times a day (QID) | ORAL | Status: DC | PRN
Start: 2013-02-27 — End: 2013-02-28

## 2013-02-27 MED ORDER — ALBUTEROL SULFATE (5 MG/ML) 0.5% IN NEBU
2.5000 mg | INHALATION_SOLUTION | RESPIRATORY_TRACT | Status: DC
  Administered 2013-02-27 (×4): 2.5 mg via RESPIRATORY_TRACT
  Filled 2013-02-27 (×4): qty 0.5

## 2013-02-27 MED ORDER — MAGIC MOUTHWASH
10.0000 mL | Freq: Three times a day (TID) | ORAL | Status: DC
  Administered 2013-02-27 – 2013-02-28 (×5): 10 mL via ORAL
  Filled 2013-02-27 (×9): qty 10

## 2013-02-27 MED ORDER — ACETAMINOPHEN 325 MG PO TABS: 650.0000 mg | ORAL_TABLET | Freq: Four times a day (QID) | ORAL | Status: DC | PRN

## 2013-02-27 MED ORDER — METOCLOPRAMIDE HCL 10 MG PO TABS
10.0000 mg | ORAL_TABLET | Freq: Four times a day (QID) | ORAL | Status: DC | PRN
  Administered 2013-02-28: 10 mg via ORAL
  Filled 2013-02-27: qty 1

## 2013-02-27 MED ORDER — IPRATROPIUM BROMIDE 0.02 % IN SOLN
0.5000 mg | RESPIRATORY_TRACT | Status: DC
  Administered 2013-02-27 (×4): 0.5 mg via RESPIRATORY_TRACT
  Filled 2013-02-27 (×4): qty 2.5

## 2013-02-27 NOTE — Plan of Care (Signed)
Problem: Phase I Progression Outcomes Goal: Tolerating diet Outcome: Not Progressing Still C/O nausea.

## 2013-02-27 NOTE — Progress Notes (Addendum)
[facility] PROGRESS NOTE  [name] UJW:[id number] DOB: 12-Aug-1956 DOA: 02/25/2013 PCP: Thayer Headings, MD  Brief narrative 57 year old female with PMH of asthma, GERD, hyperlipidemia, pulmonary embolism on Coumadin was admitted on 02/26/13 with complaints of generalized malaise, chills, nausea and diarrhea. She was referred by her PCP to the ED. In ED, UA suggestive of UTI, CT abdomen and pelvis showed RLL pneumonia and bladder inflammation. Hospitalist admission was requested.   Assessment/Plan: Community acquired pneumonia/right lower lobe: Patient clinically feels worse.  Cough has increased and feels wheezier.  Likely acute asthma exacerbation with status asthmaticus. -  Continue Rocephin and azithromycin. -  Add Tessalon and guaifen with codeine 1. E. Coli Urinary tract infection: - Continue Rocephin pending abx sensitivities. 2. Hypokalemia: resolved 3. Nausea, vomiting and diarrhea: Since Thursday. Improved. No further vomiting or diarrhea since Friday. Still intermittently nauseous and only drank yesterday.   - advance diet as tolerated -  Add reglan for nausea Asthma with acute exacerbation:  -  Add prednisone and scheduled duonebs -  Repeat CXR  4. 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 Coumadin per pharmacy. INR: supratherapeutic 5. Chronic pain (low back, knees and shoulders): On chronic pain medications. 6. Thrush:  Likely due to antibiotics.  Start magic mouthwash w/ nystatin  Code Status: Full Family Communication: Discussed with patient Disposition Plan: Home when medically stable, possibly tomorrow   Consultants:  None  Procedures:  None  Antibiotics:  IV Rocephin 5/6 >  IV azithromycin 5/6 >   HPI/Subjective: Overall feels worse today with increasing cough and wheezing and fatigue.  Not able to drink much due to nausea.  . No diarrhea, but passing gas.  Has headache.    Objective: Filed Vitals:   02/26/13 1830  02/26/13 2120 02/27/13 0024 02/27/13 0545  BP:  111/66  113/60  Pulse:  73  74  Temp:  97.9 F (36.6 C)  97.7 F (36.5 C)  TempSrc:  Oral  Oral  Resp:  18 20 20   Height:      Weight:    101.923 kg (224 lb 11.2 oz)  SpO2: 94% 93%  94%    Intake/Output Summary (Last 24 hours) at 02/27/13 1127 Last data filed at 02/27/13 0800  Gross per 24 hour  Intake   1045 ml  Output   2200 ml  Net  -1155 ml   Filed Weights   02/26/13 0227 02/26/13 0500 02/27/13 0545  Weight: 98.884 kg (218 lb) 101.6 kg (223 lb 15.8 oz) 101.923 kg (224 lb 11.2 oz)    Exam:   General exam: Obese female lying comfortably supine on bed. HEENT:  Mucosa is mildly erythematous without obvious white plaques.  Sore to palpation.  No exudates.   Respiratory system:  Rales at the bilateral bases with expiratory wheezes throughout and reduced bilateral breath sounds at the deep bases.   Frequent cough.  No increased work of breathing.  Cardiovascular system: S1 & S2 heard, RRR. No JVD, murmurs, gallops, clicks or pedal edema. Telemetry: Sinus rhythm.  Gastrointestinal system: Abdomen is nondistended, soft and nontender. Normal bowel sounds heard.  Central nervous system: Alert and oriented. No focal neurological deficits.  Extremities: Symmetric 5 x 5 power.   Data Reviewed: Basic Metabolic Panel:  Recent Labs Lab 02/25/13 1904 02/26/13 0510 02/27/13 0458  NA 140 140 142  K 3.5 3.1* 3.9  CL 105 106 106  CO2 23 25 31   GLUCOSE 104* 139* 109*  BUN 8 6 4*  CREATININE  0.73 0.66 0.67  CALCIUM 9.4 8.7 8.5   Liver Function Tests:  Recent Labs Lab 02/25/13 1904 02/26/13 0510  AST 19 17  ALT 13 11  ALKPHOS 93 76  BILITOT 0.5 0.3  PROT 7.5 6.4  ALBUMIN 3.6 2.9*    Recent Labs Lab 02/25/13 1904  LIPASE 11   No results found for this basename: AMMONIA,  in the last 168 hours CBC:  Recent Labs Lab 02/25/13 1904 02/27/13 0630  WBC 7.6 5.4  NEUTROABS 4.9 2.4  HGB 14.1 11.5*  HCT 43.7 37.9   MCV 82.3 84.0  PLT 237 189   Cardiac Enzymes: No results found for this basename: CKTOTAL, CKMB, CKMBINDEX, TROPONINI,  in the last 168 hours BNP (last 3 results) No results found for this basename: PROBNP,  in the last 8760 hours CBG: No results found for this basename: GLUCAP,  in the last 168 hours  Recent Results (from the past 240 hour(s))  URINE CULTURE     Status: None   Collection Time    02/25/13  7:57 PM      Result Value Range Status   Specimen Description URINE, CLEAN CATCH   Final   Special Requests NONE   Final   Culture  Setup Time 02/26/2013 02:29   Final   Colony Count >=100,000 COLONIES/ML   Final   Culture ESCHERICHIA COLI   Final   Report Status PENDING   Incomplete     Studies: Dg Chest 2 View  02/25/2013  *RADIOLOGY REPORT*  Clinical Data: 57 year old female with cough and abnormal right lower lobe pulmonary auscultation.  CHEST - 2 VIEW  Comparison: CTA chest 11/16/2011 and earlier.  Findings: Upright AP and lateral views.  Cardiac size at the upper limits of normal to mildly enlarged. Other mediastinal contours are within normal limits.  Visualized tracheal air column is within normal limits.  No pneumothorax, pulmonary edema, pleural effusion or confluent pulmonary opacity.  No acute osseous abnormality identified.  IMPRESSION: No acute cardiopulmonary abnormality.   Original Report Authenticated By: Erskine Speed, M.D.    Ct Abdomen Pelvis W Contrast  02/25/2013  *RADIOLOGY REPORT*  Clinical Data: Emesis, diarrhea, abdominal pain  CT ABDOMEN AND PELVIS WITH CONTRAST  Technique:  Multidetector CT imaging of the abdomen and pelvis was performed following the standard protocol during bolus administration of intravenous contrast.  Contrast:  100 ml Omnipaque  Comparison: CT abdomen 02/17/2012  Findings: There is a fine nodular branching pattern within the right lower lobe which is new from comparison exam.  There is a low density lesion along the most inferior aspect  of the right middle lobe which is unchanged from comparison exams likely represents benign lesion (image 32).  Patient status post cholecystectomy. The pancreas, spleen, adrenal glands, and kidneys are normal.  The stomach, small bowel and, and cecum are normal.  The colon demonstrates diverticula of the sigmoid colon without acute inflammation.  Abdominal aorta is normal caliber.  No retroperitoneal or periportal lymphadenopathy.  No free fluid in the pelvis.  Post hysterectomy anatomy.  The bladder is thick-walled and partially collapsed.  No pelvic lymphadenopathy.  Review of bone windows demonstrates a posterior lumbar fusion at L4- L5.  No complicating features.  The  IMPRESSION: 1.  Fine branching nodular pattern within the right lower lobe suggests acute or chronic infection. 2.  Stable low density lesion inferior aspect the right hepatic lobe is likely benign. 3.  Sigmoid diverticulosis out diverticulitis. 4.  Thickened bladder wall.  Recommend clinical correlation with cystitis/urinary tract infection.   Original Report Authenticated By: Genevive Bi, M.D.      Additional labs:   Scheduled Meds: . ipratropium  0.5 mg Nebulization Q4H   And  . albuterol  2.5 mg Nebulization Q4H  . atorvastatin  10 mg Oral q1800  . azithromycin  250 mg Intravenous QHS  . cefTRIAXone (ROCEPHIN)  IV  1 g Intravenous Q24H  . lidocaine  1-3 patch Transdermal Q24H  . morphine  60 mg Oral Q12H  . pantoprazole  80 mg Oral Daily  . predniSONE  60 mg Oral Q breakfast  . sodium chloride  3 mL Intravenous Q12H  . warfarin  2.5 mg Oral Custom  . warfarin  5 mg Oral Custom  . Warfarin - Pharmacist Dosing Inpatient   Does not apply q1800   Continuous Infusions:    Principal Problem:   CAP (community acquired pneumonia) Active Problems:   Personal history of PE (pulmonary embolism)   Asthma   GERD (gastroesophageal reflux disease)   Hyperlipidemia   UTI (urinary tract infection)   Hypokalemia    Time  spent: 30 minutes    Tyaisha Cullom  [facility] Pager [phone].   If 8PM-8AM, please contact night-coverage at [email or web address], password [facility] 02/27/2013, 11:27 AM  LOS: 2 days

## 2013-02-27 NOTE — Progress Notes (Signed)
ANTICOAGULATION CONSULT NOTE - Follow Up Consult  Pharmacy Consult for Warfarin Indication: History of PE  Allergies  Allergen Reactions  . Shellfish Allergy Hives and Nausea And Vomiting    Patient Measurements: Height: 5\' 3"  (160 cm) Weight: 224 lb 11.2 oz (101.923 kg) IBW/kg (Calculated) : 52.4  Vital Signs: Temp: 97.7 F (36.5 C) (05/07 0545) Temp src: Oral (05/07 0545) BP: 113/60 mmHg (05/07 0545) Pulse Rate: 74 (05/07 0545)  Labs:  Recent Labs  02/25/13 1904 02/25/13 2331 02/26/13 0510 02/27/13 0458 02/27/13 0630  HGB 14.1  --   --   --  11.5*  HCT 43.7  --   --   --  37.9  PLT 237  --   --   --  189  LABPROT  --  29.1* 27.8* 32.7*  --   INR  --  2.94* 2.76* 3.44*  --   CREATININE 0.73  --  0.66 0.67  --     Estimated Creatinine Clearance: 88.4 ml/min (by C-G formula based on Cr of 0.67).   Medications:  Scheduled:  . ipratropium  0.5 mg Nebulization Q4H   And  . albuterol  2.5 mg Nebulization Q4H  . [COMPLETED] albuterol      . atorvastatin  10 mg Oral q1800  . azithromycin  250 mg Intravenous QHS  . benzonatate  100 mg Oral TID  . cefTRIAXone (ROCEPHIN)  IV  1 g Intravenous Q24H  . lidocaine  1-3 patch Transdermal Q24H  . magic mouthwash  10 mL Oral TID AC & HS  . morphine  60 mg Oral Q12H  . pantoprazole  80 mg Oral Daily  . [COMPLETED] potassium chloride  40 mEq Oral Once  . predniSONE  60 mg Oral Q breakfast  . sodium chloride  3 mL Intravenous Q12H  . warfarin  2.5 mg Oral Custom  . warfarin  5 mg Oral Custom  . Warfarin - Pharmacist Dosing Inpatient   Does not apply q1800   Infusions:  . [EXPIRED] sodium chloride 50 mL/hr at 02/26/13 1712  . [DISCONTINUED] sodium chloride 1,000 mL (02/26/13 0247)    Assessment: 57 yo female on chronic warfarin for history of PE presents with UTI and CAP.  Home dose reported as 2.5mg  daily except 5mg  MWF.  INR therapeutic SUPRAtherapeutic this am at 3.44  CBC reveals Hgb=11.5 (decreased), plts  OK  Potential drug interaction with azithromycin noted - may potentiate warfarin effects and increase INR.  Goal of Therapy:  INR 2-3   Plan:   INR supratherapeutic today with large rise in INR - no coumadin today  ? Increase warfarin sensitivity d/t acute illness + on prednisone and antibiotics  Follow-up with daily INR  Juliette Alcide, PharmD, BCPS.   Pager: [phone] 02/27/2013,11:33 AM

## 2013-02-28 DIAGNOSIS — J45909 Unspecified asthma, uncomplicated: Secondary | ICD-10-CM

## 2013-02-28 LAB — CBC
HCT: 37.1 % (ref 36.0–46.0)
RDW: 16.5 % — ABNORMAL HIGH (ref 11.5–15.5)
WBC: 6.4 10*3/uL (ref 4.0–10.5)

## 2013-02-28 LAB — PROTIME-INR
INR: 3.25 — ABNORMAL HIGH (ref 0.00–1.49)
Prothrombin Time: 31.4 seconds — ABNORMAL HIGH (ref 11.6–15.2)

## 2013-02-28 MED ORDER — AZITHROMYCIN 250 MG PO TABS: 250.0000 mg | ORAL_TABLET | Freq: Every day | ORAL | Status: DC

## 2013-02-28 MED ORDER — GUAIFENESIN-CODEINE 100-10 MG/5ML PO SOLN: 10.0000 mL | Freq: Four times a day (QID) | ORAL | Status: DC | PRN

## 2013-02-28 MED ORDER — PREDNISONE 10 MG PO TABS: ORAL_TABLET | ORAL | Status: DC

## 2013-02-28 MED ORDER — AMOXICILLIN 500 MG PO CAPS: 1000.0000 mg | ORAL_CAPSULE | Freq: Three times a day (TID) | ORAL | Status: DC

## 2013-02-28 MED ORDER — ALBUTEROL SULFATE HFA 108 (90 BASE) MCG/ACT IN AERS: 2.0000 | INHALATION_SPRAY | Freq: Four times a day (QID) | RESPIRATORY_TRACT | Status: DC | PRN

## 2013-02-28 MED ORDER — ALBUTEROL SULFATE (5 MG/ML) 0.5% IN NEBU: 2.5000 mg | INHALATION_SOLUTION | Freq: Three times a day (TID) | RESPIRATORY_TRACT | Status: DC

## 2013-02-28 MED ORDER — NYSTATIN 100000 UNIT/ML MT SUSP: 500000.0000 [IU] | Freq: Four times a day (QID) | OROMUCOSAL | Status: DC

## 2013-02-28 MED ORDER — BENZONATATE 100 MG PO CAPS: 100.0000 mg | ORAL_CAPSULE | Freq: Three times a day (TID) | ORAL | Status: DC

## 2013-02-28 MED ORDER — BECLOMETHASONE DIPROPIONATE 40 MCG/ACT IN AERS: 2.0000 | INHALATION_SPRAY | Freq: Two times a day (BID) | RESPIRATORY_TRACT | Status: DC

## 2013-02-28 MED ORDER — IPRATROPIUM BROMIDE 0.02 % IN SOLN: 0.5000 mg | Freq: Three times a day (TID) | RESPIRATORY_TRACT | Status: DC

## 2013-02-28 NOTE — Progress Notes (Signed)
ANTICOAGULATION CONSULT NOTE - Follow Up Consult  Pharmacy Consult for Warfarin Indication: History of PE  Allergies  Allergen Reactions  . Shellfish Allergy Hives and Nausea And Vomiting    Patient Measurements: Height: 5\' 3"  (160 cm) Weight: 224 lb 11.2 oz (101.923 kg) IBW/kg (Calculated) : 52.4  Vital Signs: Temp: 97.7 F (36.5 C) (05/08 0950) Temp src: Oral (05/08 0950) BP: 122/66 mmHg (05/08 0950) Pulse Rate: 79 (05/08 0950)  Labs:  Recent Labs  02/25/13 1904  02/26/13 0510 02/27/13 0458 02/27/13 0630 02/28/13 0550 02/28/13 0645  HGB 14.1  --   --   --  11.5* 11.5*  --   HCT 43.7  --   --   --  37.9 37.1  --   PLT 237  --   --   --  189 224  --   LABPROT  --   < > 27.8* 32.7*  --   --  31.4*  INR  --   < > 2.76* 3.44*  --   --  3.25*  CREATININE 0.73  --  0.66 0.67  --   --   --   < > = values in this interval not displayed.  Estimated Creatinine Clearance: 88.4 ml/min (by C-G formula based on Cr of 0.67).   Medications:  Scheduled:  . albuterol  2.5 mg Nebulization TID  . atorvastatin  10 mg Oral q1800  . azithromycin  250 mg Intravenous QHS  . benzonatate  100 mg Oral TID  . cefTRIAXone (ROCEPHIN)  IV  1 g Intravenous Q24H  . ipratropium  0.5 mg Nebulization TID  . lidocaine  1-3 patch Transdermal Q24H  . magic mouthwash  10 mL Oral TID AC & HS  . morphine  60 mg Oral Q12H  . pantoprazole  80 mg Oral Daily  . predniSONE  60 mg Oral Q breakfast  . sodium chloride  3 mL Intravenous Q12H  . Warfarin - Pharmacist Dosing Inpatient   Does not apply q1800  . [DISCONTINUED] albuterol  2.5 mg Nebulization Q4H  . [DISCONTINUED] ipratropium  0.5 mg Nebulization Q4H   Infusions:     Assessment: 57 yo female on chronic warfarin for history of PE presents with UTI and CAP.  Home dose reported as 2.5mg  daily except 5mg  MWF.  INR therapeutic SUPRAtherapeutic this am at 3.25 (down from 3.44 on 5/7)  CBC reveals Hgb=11.5 (stable), plts OK  Potential drug  interaction with azithromycin noted - may potentiate warfarin effects and increase INR.  Goal of Therapy:  INR 2-3   Plan:   INR supratherapeutic today although trending down from yesterday - no coumadin today  ? Increase warfarin sensitivity d/t acute illness + on prednisone and antibiotics  Follow-up with daily INR  Juliette Alcide, PharmD, BCPS.   Pager: [phone] 02/28/2013,12:08 PM

## 2013-02-28 NOTE — Discharge Summary (Addendum)
Physician Discharge Summary  TENEKA Montoya ZOX:[id number] DOB: 1956-06-12 DOA: 02/25/2013  PCP: Thayer Headings, MD  Admit date: 02/25/2013 Discharge date: 02/28/2013  Recommendations for Outpatient Follow-up:  1. Follow up with your primary care doctor within one week of discharge.   2. Continue prednisone taper and nebulizer treatments until follow up with PCP 3.   Discharge Diagnoses:  Principal Problem:   CAP (community acquired pneumonia) Active Problems:   Personal history of PE (pulmonary embolism)   Asthma   GERD (gastroesophageal reflux disease)   Hyperlipidemia   UTI (urinary tract infection)   Hypokalemia   Thrush   Asthma with acute exacerbation   Discharge Condition: stable, improved  Diet recommendation: healthy heart  Wt Readings from Last 3 Encounters:  02/27/13 101.923 kg (224 lb 11.2 oz)  11/18/11 100.2 kg (220 lb 14.4 oz)    History of present illness:  This is a 57 y.o. year old female with significant past medical history of obesity, asthma, chronic pain, GERD presenting with UTI and CAP. Pt states that she has had generalized malaise, cough, abdominal pain, nausea, diarrhea over last 4-5 days. Pt states that sxs have progressed over this time period. + chills at home. No documented fever. Cough non productive. Had some secondary mild chest pain without radiation No hx/o CAD/MI. Abdominal pain generalized without focality.This is also chronic. Also with mild flank pain. Diarrhea watery. + dysuria over this same time frame. No hematuria. Pt went to PCP with issues today and was referred to ER.  In ER, UA indicative of UTI. CT Abd and Pelvis showed RLL PNA and bladder inflammation. No recent hospitalization over last 3 months. No WBC or fever on presentation   Hospital Course:   [name] was admitted with CAP/RLL pneumonia.  She was started on ceftriaxone and azithromycin and initially felt better, but then on 5/7 developed increasing SOB and wheeze.  Her  CXR demonstrated chronic slight interstitial accentuation, stable from prior.  She was started on prednisone, duonebs, and cough medication which improved her dyspnea and she was feeling much better by the date of discharge.    E. Coli UTI was pansensitive and she will complete a total of 7 days of antibiotics for UTI (due to course for PNA)  N/V/D:  Improved somewhat and she was able to eat and drink prior to discharge.   Recurrent PE:  She continued coumadin per pharmacy dosing.  Her INR rose to supratherapeutic range, possible due to her recent diarrhea, but then started to trend down. She should check her INR tomorrow morning and contact her doctor to discuss what to do with her coumadin dose.  .   Lab Results  Component Value Date   INR 3.25* 02/28/2013   INR 3.44* 02/27/2013   INR 2.76* 02/26/2013    She continued her chronic pain medications.  For thrush, likely related to recent antibiotics, she was started on nystatin.    Consultants:  None Procedures:  None Antibiotics:  Rocephin 5/6 >  Azithromycin 5/6 >  Discharge Exam: Filed Vitals:   02/28/13 0950  BP: 122/66  Pulse: 79  Temp: 97.7 F (36.5 C)  Resp:    Filed Vitals:   02/28/13 0632 02/28/13 0740 02/28/13 0830 02/28/13 0950  BP: 100/56 120/76  122/66  Pulse: 82 74  79  Temp: 97.9 F (36.6 C) 97.8 F (36.6 C)  97.7 F (36.5 C)  TempSrc: Oral Oral  Oral  Resp: 20 11    Height:  Weight:      SpO2: 95% 97% 97% 89%    General exam: Obese female, NAD HEENT:  Mucosa is mildly erythematous without obvious white plaques.  No exudates.  Respiratory system:   Diminished bilateral basilar BS and end-expiratory wheeze.  No rales.  No increased work of breathing.  Cardiovascular system: S1 & S2 heard, RRR. No JVD.  2/6 LSB murmur, no gallops, clicks or pedal edema. Telemetry: Sinus rhythm.  Gastrointestinal system: Abdomen is nondistended, soft and nontender. Normal bowel sounds heard.  Central nervous system:  Alert and oriented. No focal neurological deficits.  Extremities: Symmetric 5 x 5 power.   Discharge Instructions      Discharge Orders   Future Orders Complete By Expires     Call MD for:  difficulty breathing, headache or visual disturbances  As directed     Call MD for:  extreme fatigue  As directed     Call MD for:  hives  As directed     Call MD for:  persistant dizziness or light-headedness  As directed     Call MD for:  persistant nausea and vomiting  As directed     Call MD for:  severe uncontrolled pain  As directed     Call MD for:  temperature >100.4  As directed     Diet - low sodium heart healthy  As directed     Discharge instructions  As directed     Comments:      You were hospitalized with pneumonia and asthma attack.  Please continue azithromycin, a once daily medication, once nightly, next dose tonight and final dose tomorrow night.  Please continue amoxicillin for the next 4 days until all pills are gone.  Take 2 pills at a time three times per day, next dose this evening.  Please continue a prednisone taper for your asthma with albuterol as needed for symptom relief.  You may use your nebulizer if you would like, otherwise you may use albuterol inhaler.  Please start a daily inhaler for your asthma to prevent exacerbations called QVAR.  You will have completed a full course of antibiotics for urinary tract infection.  For your coumadin, your INR was still somewhat elevated so please do not take your coumadin this evening.  Please have your INR checked tomorrow and ask your doctor about resuming your warfarin based on tomorrow's level.    Increase activity slowly  As directed         Medication List    STOP taking these medications       warfarin 5 MG tablet  Commonly known as:  COUMADIN      TAKE these medications       albuterol (5 MG/ML) 0.5% nebulizer solution  Commonly known as:  PROVENTIL  Take 0.5 mLs (2.5 mg total) by nebulization 3 (three) times  daily.     albuterol 108 (90 BASE) MCG/ACT inhaler  Commonly known as:  PROVENTIL HFA;VENTOLIN HFA  Inhale 2 puffs into the lungs every 6 (six) hours as needed. For wheezing     amoxicillin 500 MG capsule  Commonly known as:  AMOXIL  Take 2 capsules (1,000 mg total) by mouth 3 (three) times daily.     azithromycin 250 MG tablet  Commonly known as:  ZITHROMAX  Take 1 tablet (250 mg total) by mouth at bedtime.     beclomethasone 40 MCG/ACT inhaler  Commonly known as:  QVAR  Inhale 2 puffs into the lungs 2 (two)  times daily.     benzonatate 100 MG capsule  Commonly known as:  TESSALON  Take 1 capsule (100 mg total) by mouth 3 (three) times daily.     cyclobenzaprine 10 MG tablet  Commonly known as:  FLEXERIL  Take 10 mg by mouth 3 (three) times daily as needed. For muscle spasms     diazepam 10 MG tablet  Commonly known as:  VALIUM  Take 10 mg by mouth 3 (three) times daily as needed for anxiety.     diclofenac sodium 1 % Gel  Commonly known as:  VOLTAREN  Apply 2 g topically 4 (four) times daily as needed. For pain (shoulder,knees, back, neck)     furosemide 40 MG tablet  Commonly known as:  LASIX  Take 40 mg by mouth daily as needed. For fluid retention.     guaiFENesin-codeine 100-10 MG/5ML syrup  Take 10 mLs by mouth every 6 (six) hours as needed for cough.     ipratropium 0.02 % nebulizer solution  Commonly known as:  ATROVENT  Take 2.5 mLs (0.5 mg total) by nebulization 3 (three) times daily.     isometheptene-acetaminophen-dichloralphenazone 65-325-100 MG capsule  Commonly known as:  MIDRIN  Take 1-2 capsules by mouth 4 (four) times daily as needed. For headaches. Max dose is 5 tabs in 24 hrs. Take 2 tabs at the onset of headache then take 1 tab each hour if needed.     lidocaine 5 %  Commonly known as:  LIDODERM  Place 1-3 patches onto the skin daily. Remove & Discard patch within 12 hours or as directed by MD     loperamide 2 MG capsule  Commonly known as:   IMODIUM  Take 2 mg by mouth 4 (four) times daily as needed for diarrhea or loose stools.     nystatin 100000 UNIT/ML suspension  Commonly known as:  MYCOSTATIN  Take 5 mLs (500,000 Units total) by mouth 4 (four) times daily.     omeprazole 40 MG capsule  Commonly known as:  PRILOSEC  Take 40 mg by mouth 2 (two) times daily.     oxyCODONE-acetaminophen 10-325 MG per tablet  Commonly known as:  PERCOCET  Take 1-2 tablets by mouth every 4 (four) hours as needed for pain (For breakthrough pain). For pain     oxymorphone 30 MG 12 hr tablet  Commonly known as:  OPANA ER  Take 30 mg by mouth every 12 (twelve) hours.     predniSONE 10 MG tablet  Commonly known as:  DELTASONE  Take 6 tabs x 1 day, then 5 tabs x 1 day, then 4 tabs x 1 day, then 3 tabs x 1 day, then 2 tabs x 1 day, then 1 tab x 1 day, then stop.     promethazine 25 MG tablet  Commonly known as:  PHENERGAN  Take 25 mg by mouth every 6 (six) hours as needed for nausea.     rosuvastatin 5 MG tablet  Commonly known as:  CRESTOR  Take 5 mg by mouth daily.     TRIAMINIC COLD/COUGH DAY TIME 2.5-5 MG/5ML Syrp  Generic drug:  Phenylephrine-DM  Take 5 mLs by mouth every 6 (six) hours as needed. For cough.       Follow-up Information   Follow up with Thayer Headings, MD. Schedule an appointment as soon as possible for a visit in 1 week.   Contact information:   [address] [place] [address] [phone]  The results of significant diagnostics from this hospitalization (including imaging, microbiology, ancillary and laboratory) are listed below for reference.    Significant Diagnostic Studies: Dg Chest 2 View  02/25/2013  *RADIOLOGY REPORT*  Clinical Data: 58 year old female with cough and abnormal right lower lobe pulmonary auscultation.  CHEST - 2 VIEW  Comparison: CTA chest 11/16/2011 and earlier.  Findings: Upright AP and lateral views.  Cardiac size at the upper limits of normal to mildly  enlarged. Other mediastinal contours are within normal limits.  Visualized tracheal air column is within normal limits.  No pneumothorax, pulmonary edema, pleural effusion or confluent pulmonary opacity.  No acute osseous abnormality identified.  IMPRESSION: No acute cardiopulmonary abnormality.   Original Report Authenticated By: Erskine Speed, M.D.    Ct Abdomen Pelvis W Contrast  02/25/2013  *RADIOLOGY REPORT*  Clinical Data: Emesis, diarrhea, abdominal pain  CT ABDOMEN AND PELVIS WITH CONTRAST  Technique:  Multidetector CT imaging of the abdomen and pelvis was performed following the standard protocol during bolus administration of intravenous contrast.  Contrast:  100 ml Omnipaque  Comparison: CT abdomen 02/17/2012  Findings: There is a fine nodular branching pattern within the right lower lobe which is new from comparison exam.  There is a low density lesion along the most inferior aspect of the right middle lobe which is unchanged from comparison exams likely represents benign lesion (image 32).  Patient status post cholecystectomy. The pancreas, spleen, adrenal glands, and kidneys are normal.  The stomach, small bowel and, and cecum are normal.  The colon demonstrates diverticula of the sigmoid colon without acute inflammation.  Abdominal aorta is normal caliber.  No retroperitoneal or periportal lymphadenopathy.  No free fluid in the pelvis.  Post hysterectomy anatomy.  The bladder is thick-walled and partially collapsed.  No pelvic lymphadenopathy.  Review of bone windows demonstrates a posterior lumbar fusion at L4- L5.  No complicating features.  The  IMPRESSION: 1.  Fine branching nodular pattern within the right lower lobe suggests acute or chronic infection. 2.  Stable low density lesion inferior aspect the right hepatic lobe is likely benign. 3.  Sigmoid diverticulosis out diverticulitis. 4.  Thickened bladder wall.  Recommend clinical correlation with cystitis/urinary tract infection.   Original  Report Authenticated By: Genevive Bi, M.D.    Dg Chest Port 1 View  02/27/2013  *RADIOLOGY REPORT*  Clinical Data: Increased wheezing today.  PORTABLE CHEST - 1 VIEW  Comparison: 02/25/2013  Findings: The heart size and pulmonary vascularity are normal.  No acute infiltrates or effusions.  Chronic accentuation of the interstitial markings, unchanged.  No acute osseous abnormality. 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  IMPRESSION: Chronic slight interstitial accentuation, stable.   Original Report Authenticated By: Francene Boyers, M.D.     Microbiology: Recent Results (from the past 240 hour(s))  URINE CULTURE     Status: None   Collection Time    02/25/13  7:57 PM      Result Value Range Status   Specimen Description URINE, CLEAN CATCH   Final   Special Requests NONE   Final   Culture  Setup Time 02/26/2013 02:29   Final   Colony Count >=100,000 COLONIES/ML   Final   Culture ESCHERICHIA COLI   Final   Report Status 02/27/2013 FINAL   Final   Organism ID, Bacteria ESCHERICHIA COLI   Final     Labs: Basic Metabolic Panel:  Recent Labs Lab 02/25/13 1904 02/26/13 0510 02/27/13 0458  NA 140 140 142  K 3.5 3.1* 3.9  CL 105 106 106  CO2 23 25 31   GLUCOSE 104* 139* 109*  BUN 8 6 4*  CREATININE 0.73 0.66 0.67  CALCIUM 9.4 8.7 8.5   Liver Function Tests:  Recent Labs Lab 02/25/13 1904 02/26/13 0510  AST 19 17  ALT 13 11  ALKPHOS 93 76  BILITOT 0.5 0.3  PROT 7.5 6.4  ALBUMIN 3.6 2.9*    Recent Labs Lab 02/25/13 1904  LIPASE 11   No results found for this basename: AMMONIA,  in the last 168 hours CBC:  Recent Labs Lab 02/25/13 1904 02/27/13 0630 02/28/13 0550  WBC 7.6 5.4 6.4  NEUTROABS 4.9 2.4  --   HGB 14.1 11.5* 11.5*  HCT 43.7 37.9 37.1  MCV 82.3 84.0 82.4  PLT 237 189 224   Cardiac Enzymes: No results found for this basename: CKTOTAL, CKMB, CKMBINDEX, TROPONINI,  in the last 168 hours BNP: BNP (last 3 results) No results found for this  basename: PROBNP,  in the last 8760 hours CBG: No results found for this basename: GLUCAP,  in the last 168 hours  Time coordinating discharge: 45 minutes  Signed:  Azaliyah Kennard  [facility] 02/28/2013, 12:26 PM

## 2013-03-14 ENCOUNTER — Emergency Department (HOSPITAL_COMMUNITY)
Admission: EM | Admit: 2013-03-14 | Discharge: 2013-03-14 | Disposition: A | Discharge status: Home / Self Care | Payer: Medicare Other | Attending: Emergency Medicine | Admitting: Emergency Medicine

## 2013-03-14 ENCOUNTER — Emergency Department (HOSPITAL_COMMUNITY): Payer: Medicare Other

## 2013-03-14 ENCOUNTER — Other Ambulatory Visit: Payer: Self-pay

## 2013-03-14 ENCOUNTER — Encounter (HOSPITAL_COMMUNITY): Payer: Self-pay | Admitting: Adult Health

## 2013-03-14 DIAGNOSIS — R269 Unspecified abnormalities of gait and mobility: Secondary | ICD-10-CM | POA: Insufficient documentation

## 2013-03-14 DIAGNOSIS — E7800 Pure hypercholesterolemia, unspecified: Secondary | ICD-10-CM | POA: Insufficient documentation

## 2013-03-14 DIAGNOSIS — M25569 Pain in unspecified knee: Secondary | ICD-10-CM | POA: Insufficient documentation

## 2013-03-14 DIAGNOSIS — J45909 Unspecified asthma, uncomplicated: Secondary | ICD-10-CM | POA: Insufficient documentation

## 2013-03-14 DIAGNOSIS — R011 Cardiac murmur, unspecified: Secondary | ICD-10-CM | POA: Insufficient documentation

## 2013-03-14 DIAGNOSIS — R5381 Other malaise: Secondary | ICD-10-CM | POA: Insufficient documentation

## 2013-03-14 DIAGNOSIS — Z86711 Personal history of pulmonary embolism: Secondary | ICD-10-CM | POA: Insufficient documentation

## 2013-03-14 DIAGNOSIS — M25559 Pain in unspecified hip: Secondary | ICD-10-CM | POA: Insufficient documentation

## 2013-03-14 DIAGNOSIS — Y998 Other external cause status: Secondary | ICD-10-CM | POA: Insufficient documentation

## 2013-03-14 DIAGNOSIS — M25579 Pain in unspecified ankle and joints of unspecified foot: Secondary | ICD-10-CM | POA: Insufficient documentation

## 2013-03-14 DIAGNOSIS — K219 Gastro-esophageal reflux disease without esophagitis: Secondary | ICD-10-CM | POA: Insufficient documentation

## 2013-03-14 DIAGNOSIS — R059 Cough, unspecified: Secondary | ICD-10-CM | POA: Insufficient documentation

## 2013-03-14 DIAGNOSIS — Z79899 Other long term (current) drug therapy: Secondary | ICD-10-CM | POA: Insufficient documentation

## 2013-03-14 DIAGNOSIS — R05 Cough: Secondary | ICD-10-CM | POA: Insufficient documentation

## 2013-03-14 DIAGNOSIS — M25551 Pain in right hip: Secondary | ICD-10-CM

## 2013-03-14 DIAGNOSIS — Z8701 Personal history of pneumonia (recurrent): Secondary | ICD-10-CM | POA: Insufficient documentation

## 2013-03-14 DIAGNOSIS — Y92009 Unspecified place in unspecified non-institutional (private) residence as the place of occurrence of the external cause: Secondary | ICD-10-CM | POA: Insufficient documentation

## 2013-03-14 DIAGNOSIS — M25561 Pain in right knee: Secondary | ICD-10-CM

## 2013-03-14 DIAGNOSIS — W010XXA Fall on same level from slipping, tripping and stumbling without subsequent striking against object, initial encounter: Secondary | ICD-10-CM | POA: Insufficient documentation

## 2013-03-14 HISTORY — DX: Cardiac murmur, unspecified: R01.1

## 2013-03-14 HISTORY — DX: Pneumonia, unspecified organism: J18.9

## 2013-03-14 HISTORY — DX: Diaphragmatic hernia without obstruction or gangrene: K44.9

## 2013-03-14 LAB — PROTIME-INR: Prothrombin Time: 26.7 seconds — ABNORMAL HIGH (ref 11.6–15.2)

## 2013-03-14 LAB — CBC
Hemoglobin: 14.5 g/dL (ref 12.0–15.0)
MCH: 27.1 pg (ref 26.0–34.0)
MCHC: 32.1 g/dL (ref 30.0–36.0)
RDW: 18 % — ABNORMAL HIGH (ref 11.5–15.5)

## 2013-03-14 MED ORDER — ONDANSETRON HCL 4 MG PO TABS: 4.0000 mg | ORAL_TABLET | Freq: Four times a day (QID) | ORAL | Status: DC

## 2013-03-14 MED ORDER — HYDROMORPHONE HCL PF 1 MG/ML IJ SOLN
1.0000 mg | Freq: Once | INTRAMUSCULAR | Status: AC
  Administered 2013-03-14: 1 mg via INTRAVENOUS
  Filled 2013-03-14: qty 1

## 2013-03-14 NOTE — ED Provider Notes (Signed)
History     CSN: [id number]  Arrival date & time 03/14/13  1752   First MD Initiated Contact with Patient 03/14/13 1838      Chief Complaint  Patient presents with  . Leg Pain    (Consider location/radiation/quality/duration/timing/severity/associated sxs/prior treatment) HPI Comments: 57 year old female with PMH of pneumonia, asthma, GERD, hyperlipidemia, pulmonary embolism (on Coumadin) presents today s/p a fall a week ago with increasing right hip and right knee pain. Pain radiates to the right groin and down the lateral right leg. Pain to left ankle. Pt has been taking her pain meds at home with not relief.   Additional concern to the pt and to family at bedside is the fact that the pt does not remember falling. She explains remembering sitting in a chair and thinking she would have to get up in a minute. The next things she remembers is waking up  On the floor 30 minutes later. Pt normally ambulates at home with a walker and was ambulatory after the fall, though pain has been increasing with activity. Better with rest.   Pt was recently admitted on 02/26/13 for RLL pneumonia.  Patient is a 57 y.o. female presenting with leg pain.  Leg Pain Associated symptoms: fatigue   Associated symptoms: no fever and no neck pain     Past Medical History  Diagnosis Date  . Asthma   . Pulmonary embolism   . High cholesterol   . Acid reflux   . Pneumonia   . Heart murmur   . Hiatal hernia     Past Surgical History  Procedure Laterality Date  . Replacement total knee    . Cholecystectomy    . Abdominal hysterectomy    . Back surgery    . Joint replacement    . Carpal tunnel release    .  bone spurs removed from feet    . Tubal ligation      Family History  Problem Relation Age of Onset  . Heart failure Mother   . COPD Mother   . Heart failure Father   . COPD Father   . Cancer Other   . Heart failure Other     History  Substance Use Topics  . Smoking status: Never  Smoker   . Smokeless tobacco: Not on file  . Alcohol Use: No    OB History   Grav Para Term Preterm Abortions TAB SAB Ect Mult Living                  Review of Systems  Constitutional: Positive for fatigue. Negative for fever, chills and diaphoresis.  HENT: Negative for neck pain and neck stiffness.   Eyes: Negative for visual disturbance.  Respiratory: Positive for cough. Negative for apnea, chest tightness and shortness of breath.   Cardiovascular: Negative for chest pain and palpitations.  Gastrointestinal: Negative for nausea, vomiting, diarrhea and constipation.  Genitourinary: Negative for dysuria.  Musculoskeletal: Positive for arthralgias and gait problem.       Right hip, right knee, left ankle.  Pt ambulates with walker, increasing pain to right lower extremity  Skin: Negative for rash.       Bruise to left ankle  Neurological: Negative for dizziness, weakness, light-headedness, numbness and headaches.    Allergies  Shellfish allergy and Tape  Home Medications   Current Outpatient Rx  Name  Route  Sig  Dispense  Refill  . albuterol (PROVENTIL HFA;VENTOLIN HFA) 108 (90 BASE) MCG/ACT inhaler   Inhalation  Inhale 2 puffs into the lungs every 6 (six) hours as needed. For wheezing   2 each   0   . albuterol (PROVENTIL) (5 MG/ML) 0.5% nebulizer solution   Nebulization   Take 0.5 mLs (2.5 mg total) by nebulization 3 (three) times daily.   20 mL   0   . amoxicillin (AMOXIL) 500 MG capsule   Oral   Take 2 capsules (1,000 mg total) by mouth 3 (three) times daily.   24 capsule   0   . azithromycin (ZITHROMAX) 250 MG tablet   Oral   Take 1 tablet (250 mg total) by mouth at bedtime.   2 each   0   . beclomethasone (QVAR) 40 MCG/ACT inhaler   Inhalation   Inhale 2 puffs into the lungs 2 (two) times daily.   1 Inhaler   12   . benzonatate (TESSALON) 100 MG capsule   Oral   Take 1 capsule (100 mg total) by mouth 3 (three) times daily.   20 capsule   0    . cyclobenzaprine (FLEXERIL) 10 MG tablet   Oral   Take 10 mg by mouth 3 (three) times daily as needed. For muscle spasms         . diazepam (VALIUM) 10 MG tablet   Oral   Take 10 mg by mouth 3 (three) times daily as needed for anxiety.         . diclofenac sodium (VOLTAREN) 1 % GEL   Topical   Apply 2 g topically 4 (four) times daily as needed. For pain (shoulder,knees, back, neck)         . furosemide (LASIX) 40 MG tablet   Oral   Take 40 mg by mouth daily as needed. For fluid retention.         Marland Kitchen guaiFENesin-codeine 100-10 MG/5ML syrup   Oral   Take 10 mLs by mouth every 6 (six) hours as needed for cough.   120 mL   0   . ipratropium (ATROVENT) 0.02 % nebulizer solution   Nebulization   Take 2.5 mLs (0.5 mg total) by nebulization 3 (three) times daily.   75 mL   0   . isometheptene-acetaminophen-dichloralphenazone (MIDRIN) 65-325-100 MG capsule   Oral   Take 1-2 capsules by mouth 4 (four) times daily as needed. For headaches. Max dose is 5 tabs in 24 hrs. Take 2 tabs at the onset of headache then take 1 tab each hour if needed.         . lidocaine (LIDODERM) 5 %   Transdermal   Place 1-3 patches onto the skin daily. Remove & Discard patch within 12 hours or as directed by MD         . loperamide (IMODIUM) 2 MG capsule   Oral   Take 2 mg by mouth 4 (four) times daily as needed for diarrhea or loose stools.         . nystatin (MYCOSTATIN) 100000 UNIT/ML suspension   Oral   Take 5 mLs (500,000 Units total) by mouth 4 (four) times daily.   60 mL   0   . omeprazole (PRILOSEC) 40 MG capsule   Oral   Take 40 mg by mouth 2 (two) times daily.          Marland Kitchen oxyCODONE-acetaminophen (PERCOCET) 10-325 MG per tablet   Oral   Take 1-2 tablets by mouth every 4 (four) hours as needed for pain (For breakthrough pain). For pain         .  oxymorphone (OPANA ER) 30 MG 12 hr tablet   Oral   Take 30 mg by mouth every 12 (twelve) hours.         .  Phenylephrine-DM (TRIAMINIC COLD/COUGH DAY TIME) 2.5-5 MG/5ML SYRP   Oral   Take 5 mLs by mouth every 6 (six) hours as needed. For cough.         . predniSONE (DELTASONE) 10 MG tablet      Take 6 tabs x 1 day, then 5 tabs x 1 day, then 4 tabs x 1 day, then 3 tabs x 1 day, then 2 tabs x 1 day, then 1 tab x 1 day, then stop.   21 tablet   0   . promethazine (PHENERGAN) 25 MG tablet   Oral   Take 25 mg by mouth every 6 (six) hours as needed for nausea.         . rosuvastatin (CRESTOR) 5 MG tablet   Oral   Take 5 mg by mouth daily.           BP 132/104  Pulse 85  Temp(Src) 98.2 F (36.8 C) (Oral)  Resp 16  SpO2 95%  Physical Exam  Nursing note and vitals reviewed. Constitutional: She is oriented to person, place, and time. She appears well-developed and well-nourished. No distress.  HENT:  Head: Normocephalic and atraumatic.  Eyes: Conjunctivae and EOM are normal. Pupils are equal, round, and reactive to light.  Neck: Normal range of motion. Neck supple.  No meningeal signs  Cardiovascular: Normal rate, regular rhythm and normal heart sounds.  Exam reveals no gallop and no friction rub.   No murmur heard. Pulmonary/Chest: Effort normal. No respiratory distress. She has no wheezes. She has no rales. She exhibits no tenderness.  Diminished lung sounds  Abdominal: Soft. Bowel sounds are normal. She exhibits no distension. There is no tenderness. There is no rebound and no guarding.  Musculoskeletal: Normal range of motion. She exhibits tenderness. She exhibits no edema.  Baseline strength throughout. FROM at right hip, right knee, and left ankle, though painful. No swelling. No erythema. No warmth. No effusion. Good quadricep strength on straight leg raise. No joint laxity.   Neurological: She is alert and oriented to person, place, and time. No cranial nerve deficit.  Speech is clear and goal oriented, follows commands Sensation normal to light touch and two point  discrimination Moves extremities without ataxia, coordination intact Normal gait and balance Normal strength in upper and lower extremities bilaterally including dorsiflexion and plantar flexion, strong and equal grip strength   Skin: Skin is warm and dry. She is not diaphoretic. No erythema.  Psychiatric: She has a normal mood and affect.    ED Course  Procedures (including critical care time)  Labs Reviewed  CBC - Abnormal; Notable for the following:    WBC 12.1 (*)    RBC 5.35 (*)    RDW 18.0 (*)    All other components within normal limits  PROTIME-INR - Abnormal; Notable for the following:    Prothrombin Time 26.7 (*)    INR 2.62 (*)    All other components within normal limits  GLUCOSE, CAPILLARY   Dg Chest 2 View  03/14/2013   *RADIOLOGY REPORT*  Clinical Data: Cough.  Leukocytosis.  Recent pneumonia.  CHEST - 2 VIEW  Comparison:  02/27/2013  Findings:  The heart size and mediastinal contours are within normal limits.  Both lungs are clear.  The visualized skeletal structures are unremarkable.  IMPRESSION:  No active cardiopulmonary disease.   Original Report Authenticated By: Myles Rosenthal, M.D.   Dg Hip Complete Right  03/14/2013   *RADIOLOGY REPORT*  Clinical Data: Fall, right hip pain  RIGHT HIP - COMPLETE 2+ VIEW  Comparison: None.  Findings: No fracture or dislocation is seen.  Mild degenerative changes of the bilateral hips and pubic symphysis.  Visualized bony pelvis appears intact.  Surgical hardware in the lower lumbar spine.  IMPRESSION: No fracture or dislocation is seen.  Mild degenerative changes of the bilateral hips.   Original Report Authenticated By: Charline Bills, M.D.   Ct Head Wo Contrast  03/14/2013   *RADIOLOGY REPORT*  Clinical Data: Larey Seat 1 week ago, syncope, collapse, history asthma, prior pulmonary embolism  CT HEAD WITHOUT CONTRAST  Technique:  Contiguous axial images were obtained from the base of the skull through the vertex without contrast.   Comparison: 09/17/2008  Findings: Normal ventricular morphology. No midline shift or mass effect. Minimally prominent left sylvian fissure stable. No intracranial hemorrhage, mass lesion or evidence of acute infarction. No extra-axial fluid collections. Bones and sinuses unremarkable.  IMPRESSION: No acute intracranial abnormalities.   Original Report Authenticated By: Ulyses Southward, M.D.   Dg Knee Complete 4 Views Right  03/14/2013   *RADIOLOGY REPORT*  Clinical Data: Fall 7 days ago, right lateral knee pain  RIGHT KNEE - COMPLETE 4+ VIEW  Comparison: 09/17/2008  Findings: Right knee arthroplasty.  No evidence of hardware complication or loosening.  No fracture or dislocation is seen.  No suprapatellar knee joint effusion.  IMPRESSION: No fracture or dislocation is seen.  Right knee arthroplasty without evidence of hardware complication.   Original Report Authenticated By: Charline Bills, M.D.     1. Hip pain, acute, right   2. Knee pain, acute, right       MDM  Pt concern for bony injury to right hip and right knee s/p fall a week ago. Pt has been ambulatory since the fall. Low concern for fx, but will xray to rule it out and to offer re-assurance to the pt and family. Concern also for the fact that the pt does not remember the fall so will get head CT. Pt states she has started a new cough since her hospitalization for pneumonia a few weeks ago. Labs reveal mild leukocytosis so will get chest xray as lungs sounds are diminished.  Labs and imaging reviewed.  At this time there does not appear to be any evidence of an acute emergency medical condition and the patient appears stable for discharge with appropriate outpatient follow up.  Asked pt if she would like pain meds and muscle relaxers. Pt declined as she states she has them at home. Diagnosis was discussed with patient who verbalizes understanding and is agreeable to discharge.   Glade Nurse, PA-C 03/15/13 [phone]

## 2013-03-14 NOTE — ED Notes (Signed)
BMP hemolyzed, PA notified.

## 2013-03-14 NOTE — ED Notes (Addendum)
Presents with Fall one week ago, was not seen by a doctor, reports increase in pain in right hip and right leg. She does not remember the fall, she reports one minute she was looking at her watch and the next it was 30 minutes later and  woke up on the floor.  Pt is alert and oriented, no droop, equal grip bilaterally. Pt has been ambulatory since fall.  Reports increase in fatigue.  Pain is worse with ambulation and better with rest and pain medication.

## 2013-03-14 NOTE — ED Notes (Signed)
Pt not in room.

## 2013-03-14 NOTE — ED Notes (Signed)
Pt returned from radiology.

## 2013-03-15 LAB — GLUCOSE, CAPILLARY: Glucose-Capillary: 94 mg/dL (ref 70–99)

## 2013-03-20 NOTE — ED Provider Notes (Signed)
Medical screening examination/treatment/procedure(s) were performed by non-physician practitioner and as supervising physician I was immediately available for consultation/collaboration.   Richardean Canal, MD 03/20/13 [phone]

## 2013-04-15 ENCOUNTER — Emergency Department (HOSPITAL_COMMUNITY): Payer: Medicare Other

## 2013-04-15 ENCOUNTER — Inpatient Hospital Stay (HOSPITAL_COMMUNITY)
Admission: EM | Admit: 2013-04-15 | Discharge: 2013-04-19 | DRG: 392 | Disposition: A | Discharge status: Home / Self Care | Payer: Medicare Other | Attending: Internal Medicine | Admitting: Internal Medicine

## 2013-04-15 ENCOUNTER — Encounter (HOSPITAL_COMMUNITY): Payer: Self-pay | Admitting: *Deleted

## 2013-04-15 DIAGNOSIS — K5909 Other constipation: Secondary | ICD-10-CM | POA: Diagnosis present

## 2013-04-15 DIAGNOSIS — Z86711 Personal history of pulmonary embolism: Secondary | ICD-10-CM | POA: Diagnosis present

## 2013-04-15 DIAGNOSIS — J449 Chronic obstructive pulmonary disease, unspecified: Secondary | ICD-10-CM | POA: Diagnosis present

## 2013-04-15 DIAGNOSIS — F1920 Other psychoactive substance dependence, uncomplicated: Secondary | ICD-10-CM | POA: Diagnosis present

## 2013-04-15 DIAGNOSIS — R1084 Generalized abdominal pain: Secondary | ICD-10-CM | POA: Diagnosis present

## 2013-04-15 DIAGNOSIS — B370 Candidal stomatitis: Secondary | ICD-10-CM | POA: Diagnosis present

## 2013-04-15 DIAGNOSIS — E785 Hyperlipidemia, unspecified: Secondary | ICD-10-CM | POA: Diagnosis present

## 2013-04-15 DIAGNOSIS — R112 Nausea with vomiting, unspecified: Secondary | ICD-10-CM

## 2013-04-15 DIAGNOSIS — N390 Urinary tract infection, site not specified: Secondary | ICD-10-CM | POA: Diagnosis present

## 2013-04-15 DIAGNOSIS — K5732 Diverticulitis of large intestine without perforation or abscess without bleeding: Principal | ICD-10-CM | POA: Diagnosis present

## 2013-04-15 DIAGNOSIS — F1120 Opioid dependence, uncomplicated: Secondary | ICD-10-CM | POA: Diagnosis present

## 2013-04-15 DIAGNOSIS — K219 Gastro-esophageal reflux disease without esophagitis: Secondary | ICD-10-CM | POA: Diagnosis present

## 2013-04-15 DIAGNOSIS — Z7901 Long term (current) use of anticoagulants: Secondary | ICD-10-CM

## 2013-04-15 DIAGNOSIS — I2782 Chronic pulmonary embolism: Secondary | ICD-10-CM | POA: Diagnosis present

## 2013-04-15 DIAGNOSIS — J4489 Other specified chronic obstructive pulmonary disease: Secondary | ICD-10-CM | POA: Diagnosis present

## 2013-04-15 DIAGNOSIS — B952 Enterococcus as the cause of diseases classified elsewhere: Secondary | ICD-10-CM | POA: Diagnosis present

## 2013-04-15 DIAGNOSIS — K5900 Constipation, unspecified: Secondary | ICD-10-CM

## 2013-04-15 DIAGNOSIS — J45909 Unspecified asthma, uncomplicated: Secondary | ICD-10-CM | POA: Diagnosis present

## 2013-04-15 DIAGNOSIS — J4530 Mild persistent asthma, uncomplicated: Secondary | ICD-10-CM

## 2013-04-15 DIAGNOSIS — T4275XA Adverse effect of unspecified antiepileptic and sedative-hypnotic drugs, initial encounter: Secondary | ICD-10-CM | POA: Diagnosis present

## 2013-04-15 HISTORY — DX: Diverticulitis of intestine, part unspecified, without perforation or abscess without bleeding: K57.92

## 2013-04-15 MED ORDER — FENTANYL CITRATE 0.05 MG/ML IJ SOLN
50.0000 ug | Freq: Once | INTRAMUSCULAR | Status: AC
  Administered 2013-04-15: 50 ug via INTRAVENOUS
  Filled 2013-04-15: qty 2

## 2013-04-15 MED ORDER — ONDANSETRON HCL 4 MG/2ML IJ SOLN
4.0000 mg | Freq: Once | INTRAMUSCULAR | Status: AC
  Administered 2013-04-15: 4 mg via INTRAVENOUS
  Filled 2013-04-15: qty 2

## 2013-04-15 MED ORDER — IOHEXOL 300 MG/ML  SOLN
50.0000 mL | Freq: Once | INTRAMUSCULAR | Status: AC | PRN
  Administered 2013-04-15: 50 mL via ORAL

## 2013-04-15 MED ORDER — SODIUM CHLORIDE 0.9 % IV SOLN: Freq: Once | INTRAVENOUS | Status: DC

## 2013-04-15 MED ORDER — DICYCLOMINE HCL 10 MG/ML IM SOLN
20.0000 mg | Freq: Once | INTRAMUSCULAR | Status: AC
  Administered 2013-04-15: 20 mg via INTRAMUSCULAR
  Filled 2013-04-15: qty 2

## 2013-04-15 MED ORDER — SODIUM CHLORIDE 0.9 % IV BOLUS (SEPSIS)
1000.0000 mL | Freq: Once | INTRAVENOUS | Status: AC
  Administered 2013-04-15: 1000 mL via INTRAVENOUS

## 2013-04-15 NOTE — ED Notes (Signed)
Pt states that she has a history of diverticulitis; pt states that she has had some abdominal pain x 1 week; pt states pain has gotten progressively worse over the last week; pt states that she has not had a BM in 9 days; pt states that it feels like her insides are ripping apart

## 2013-04-16 ENCOUNTER — Encounter (HOSPITAL_COMMUNITY): Payer: Self-pay

## 2013-04-16 DIAGNOSIS — F1920 Other psychoactive substance dependence, uncomplicated: Secondary | ICD-10-CM

## 2013-04-16 DIAGNOSIS — F1120 Opioid dependence, uncomplicated: Secondary | ICD-10-CM | POA: Diagnosis present

## 2013-04-16 DIAGNOSIS — K5900 Constipation, unspecified: Secondary | ICD-10-CM | POA: Diagnosis present

## 2013-04-16 DIAGNOSIS — K5732 Diverticulitis of large intestine without perforation or abscess without bleeding: Secondary | ICD-10-CM | POA: Diagnosis present

## 2013-04-16 LAB — PROTIME-INR: INR: 2.14 — ABNORMAL HIGH (ref 0.00–1.49)

## 2013-04-16 LAB — COMPREHENSIVE METABOLIC PANEL
AST: 17 U/L (ref 0–37)
Albumin: 3.7 g/dL (ref 3.5–5.2)
Alkaline Phosphatase: 106 U/L (ref 39–117)
Chloride: 100 mEq/L (ref 96–112)
Creatinine, Ser: 0.63 mg/dL (ref 0.50–1.10)
Potassium: 3.7 mEq/L (ref 3.5–5.1)
Total Bilirubin: 0.9 mg/dL (ref 0.3–1.2)

## 2013-04-16 LAB — URINE MICROSCOPIC-ADD ON

## 2013-04-16 LAB — URINALYSIS, ROUTINE W REFLEX MICROSCOPIC
Nitrite: NEGATIVE
Specific Gravity, Urine: 1.029 (ref 1.005–1.030)
pH: 7.5 (ref 5.0–8.0)

## 2013-04-16 LAB — CBC WITH DIFFERENTIAL/PLATELET
Basophils Absolute: 0 10*3/uL (ref 0.0–0.1)
Basophils Relative: 0 % (ref 0–1)
MCHC: 32 g/dL (ref 30.0–36.0)
Neutro Abs: 9.8 10*3/uL — ABNORMAL HIGH (ref 1.7–7.7)
Neutrophils Relative %: 73 % (ref 43–77)
RDW: 16.3 % — ABNORMAL HIGH (ref 11.5–15.5)

## 2013-04-16 LAB — TSH: TSH: 5.539 u[IU]/mL — ABNORMAL HIGH (ref 0.350–4.500)

## 2013-04-16 MED ORDER — HYDROMORPHONE HCL PF 1 MG/ML IJ SOLN
1.0000 mg | INTRAMUSCULAR | Status: DC | PRN
  Administered 2013-04-16 (×4): 1 mg via INTRAVENOUS
  Filled 2013-04-16 (×4): qty 1

## 2013-04-16 MED ORDER — ALBUTEROL SULFATE HFA 108 (90 BASE) MCG/ACT IN AERS: 2.0000 | INHALATION_SPRAY | Freq: Four times a day (QID) | RESPIRATORY_TRACT | Status: DC | PRN

## 2013-04-16 MED ORDER — HYDROMORPHONE HCL PF 1 MG/ML IJ SOLN: 1.0000 mg | INTRAMUSCULAR | Status: DC | PRN

## 2013-04-16 MED ORDER — PNEUMOCOCCAL VAC POLYVALENT 25 MCG/0.5ML IJ INJ
0.5000 mL | INJECTION | INTRAMUSCULAR | Status: AC
  Filled 2013-04-16 (×2): qty 0.5

## 2013-04-16 MED ORDER — WARFARIN - PHARMACIST DOSING INPATIENT: Freq: Every day | Status: DC

## 2013-04-16 MED ORDER — METRONIDAZOLE IN NACL 5-0.79 MG/ML-% IV SOLN
500.0000 mg | Freq: Once | INTRAVENOUS | Status: AC
  Administered 2013-04-16: 500 mg via INTRAVENOUS
  Filled 2013-04-16: qty 100

## 2013-04-16 MED ORDER — CYCLOBENZAPRINE HCL 10 MG PO TABS
10.0000 mg | ORAL_TABLET | Freq: Three times a day (TID) | ORAL | Status: DC | PRN
  Administered 2013-04-19: 10 mg via ORAL
  Filled 2013-04-16 (×2): qty 1

## 2013-04-16 MED ORDER — PANTOPRAZOLE SODIUM 40 MG PO TBEC
40.0000 mg | DELAYED_RELEASE_TABLET | Freq: Every day | ORAL | Status: DC
  Administered 2013-04-16 – 2013-04-19 (×4): 40 mg via ORAL
  Filled 2013-04-16 (×4): qty 1

## 2013-04-16 MED ORDER — DEXTROSE-NACL 5-0.9 % IV SOLN
INTRAVENOUS | Status: DC
  Administered 2013-04-17: 04:00:00 via INTRAVENOUS

## 2013-04-16 MED ORDER — HYDROMORPHONE HCL PF 1 MG/ML IJ SOLN
0.5000 mg | Freq: Once | INTRAMUSCULAR | Status: AC
  Administered 2013-04-16: 0.5 mg via INTRAVENOUS
  Filled 2013-04-16: qty 1

## 2013-04-16 MED ORDER — METRONIDAZOLE IN NACL 5-0.79 MG/ML-% IV SOLN
500.0000 mg | Freq: Three times a day (TID) | INTRAVENOUS | Status: DC
  Administered 2013-04-16 – 2013-04-17 (×4): 500 mg via INTRAVENOUS
  Filled 2013-04-16 (×5): qty 100

## 2013-04-16 MED ORDER — IOHEXOL 300 MG/ML  SOLN
100.0000 mL | Freq: Once | INTRAMUSCULAR | Status: AC | PRN
  Administered 2013-04-16: 100 mL via INTRAVENOUS

## 2013-04-16 MED ORDER — LIDOCAINE 5 % EX PTCH
1.0000 | MEDICATED_PATCH | CUTANEOUS | Status: DC
  Administered 2013-04-16 – 2013-04-19 (×4): 3 via TRANSDERMAL
  Filled 2013-04-16 (×5): qty 3

## 2013-04-16 MED ORDER — OXYCODONE-ACETAMINOPHEN 5-325 MG PO TABS: 1.0000 | ORAL_TABLET | ORAL | Status: DC | PRN

## 2013-04-16 MED ORDER — HYDROMORPHONE HCL PF 1 MG/ML IJ SOLN
1.0000 mg | INTRAMUSCULAR | Status: DC | PRN
  Administered 2013-04-16 (×2): 1 mg via INTRAVENOUS
  Administered 2013-04-17 (×3): 2 mg via INTRAVENOUS
  Filled 2013-04-16: qty 2
  Filled 2013-04-16 (×2): qty 1
  Filled 2013-04-16 (×2): qty 2

## 2013-04-16 MED ORDER — ONDANSETRON HCL 4 MG PO TABS: 4.0000 mg | ORAL_TABLET | Freq: Four times a day (QID) | ORAL | Status: DC | PRN

## 2013-04-16 MED ORDER — ONDANSETRON HCL 4 MG/2ML IJ SOLN
4.0000 mg | Freq: Four times a day (QID) | INTRAMUSCULAR | Status: DC | PRN
  Administered 2013-04-16 – 2013-04-17 (×3): 4 mg via INTRAVENOUS
  Filled 2013-04-16 (×4): qty 2

## 2013-04-16 MED ORDER — MORPHINE SULFATE ER 30 MG PO TBCR
30.0000 mg | EXTENDED_RELEASE_TABLET | Freq: Two times a day (BID) | ORAL | Status: DC
  Administered 2013-04-16 – 2013-04-19 (×7): 30 mg via ORAL
  Filled 2013-04-16 (×7): qty 1

## 2013-04-16 MED ORDER — WARFARIN SODIUM 5 MG PO TABS
5.0000 mg | ORAL_TABLET | Freq: Once | ORAL | Status: AC
  Administered 2013-04-16: 5 mg via ORAL
  Filled 2013-04-16: qty 1

## 2013-04-16 MED ORDER — CIPROFLOXACIN HCL 500 MG PO TABS
500.0000 mg | ORAL_TABLET | Freq: Once | ORAL | Status: AC
  Administered 2013-04-16: 500 mg via ORAL
  Filled 2013-04-16: qty 1

## 2013-04-16 MED ORDER — SIMVASTATIN 10 MG PO TABS
10.0000 mg | ORAL_TABLET | Freq: Every day | ORAL | Status: DC
  Administered 2013-04-16 – 2013-04-18 (×3): 10 mg via ORAL
  Filled 2013-04-16 (×4): qty 1

## 2013-04-16 MED ORDER — IPRATROPIUM BROMIDE 0.02 % IN SOLN
0.5000 mg | Freq: Three times a day (TID) | RESPIRATORY_TRACT | Status: DC
  Administered 2013-04-16 – 2013-04-18 (×9): 0.5 mg via RESPIRATORY_TRACT
  Filled 2013-04-16 (×10): qty 2.5

## 2013-04-16 MED ORDER — LORAZEPAM 1 MG PO TABS
2.0000 mg | ORAL_TABLET | Freq: Two times a day (BID) | ORAL | Status: DC | PRN
Start: 2013-04-16 — End: 2013-04-19
  Administered 2013-04-16: 2 mg via ORAL
  Filled 2013-04-16: qty 2

## 2013-04-16 MED ORDER — ONDANSETRON HCL 4 MG/2ML IJ SOLN: 4.0000 mg | Freq: Three times a day (TID) | INTRAMUSCULAR | Status: DC | PRN

## 2013-04-16 MED ORDER — OXYCODONE HCL 5 MG PO TABS: 5.0000 mg | ORAL_TABLET | ORAL | Status: DC | PRN

## 2013-04-16 MED ORDER — CIPROFLOXACIN IN D5W 400 MG/200ML IV SOLN
400.0000 mg | Freq: Two times a day (BID) | INTRAVENOUS | Status: DC
  Administered 2013-04-16 – 2013-04-18 (×5): 400 mg via INTRAVENOUS
  Filled 2013-04-16 (×5): qty 200

## 2013-04-16 MED ORDER — SODIUM CHLORIDE 0.9 % IV SOLN: INTRAVENOUS | Status: AC

## 2013-04-16 NOTE — ED Provider Notes (Signed)
History    CSN: [id number] Arrival date & time 04/15/13  2203  First MD Initiated Contact with Patient 04/15/13 2304     Chief Complaint  Patient presents with  . Abdominal Pain  . Constipation   (Consider location/radiation/quality/duration/timing/severity/associated sxs/prior Treatment) HPI 57 year old female presents to emergency room complaining of diffuse abdominal pain ongoing for the last week.  Pain is worse across the right side and bottom of her stomach.  She reports she's not had a bowel movement in 9 days.  She reports past history of diverticulosis and diverticulitis.  She is on chronic oral pain medicines, and does not take stool softeners on a regular basis.  Patient has tried to magnesium citrate, milk of magnesia, and fleets enema without improvement in her constipation.  She denies any fever.  She has had nausea and vomiting over the last 24-48 hours.  She has been unable to keep down her medications.  No prior surgeries.  Past Medical History  Diagnosis Date  . Asthma   . Pulmonary embolism   . High cholesterol   . Acid reflux   . Pneumonia   . Heart murmur   . Hiatal hernia   . Diverticulitis    Past Surgical History  Procedure Laterality Date  . Replacement total knee    . Cholecystectomy    . Abdominal hysterectomy    . Back surgery    . Joint replacement    . Carpal tunnel release    .  bone spurs removed from feet    . Tubal ligation     Family History  Problem Relation Age of Onset  . Heart failure Mother   . COPD Mother   . Heart failure Father   . COPD Father   . Cancer Other   . Heart failure Other    History  Substance Use Topics  . Smoking status: Never Smoker   . Smokeless tobacco: Not on file  . Alcohol Use: No   OB History   Grav Para Term Preterm Abortions TAB SAB Ect Mult Living                 Review of Systems  All other systems reviewed and are negative.    Allergies  Shellfish allergy and Tape  Home Medications   No current outpatient prescriptions on file. BP 125/82  Pulse 92  Temp(Src) 98.3 F (36.8 C) (Oral)  Resp 14  Ht 5\' 3"  (1.6 m)  Wt 221 lb 14.4 oz (100.653 kg)  BMI 39.32 kg/m2  SpO2 93% Physical Exam  Nursing note and vitals reviewed. Constitutional: She is oriented to person, place, and time. She appears well-developed and well-nourished. She appears distressed (uncomfortable appearing, moaning).  HENT:  Head: Normocephalic and atraumatic.  Nose: Nose normal.  Mouth/Throat: Oropharynx is clear and moist.  Eyes: Conjunctivae and EOM are normal. Pupils are equal, round, and reactive to light.  Neck: Normal range of motion. Neck supple. No JVD present. No tracheal deviation present. No thyromegaly present.  Cardiovascular: Normal rate, regular rhythm, normal heart sounds and intact distal pulses.  Exam reveals no gallop and no friction rub.   No murmur heard. Pulmonary/Chest: Effort normal and breath sounds normal. No stridor. No respiratory distress. She has no wheezes. She has no rales. She exhibits no tenderness.  Abdominal: Soft. Bowel sounds are normal. She exhibits no distension and no mass. There is tenderness (diffuse tenderness across her abdomen). There is no rebound and no guarding.  Musculoskeletal: Normal  range of motion. She exhibits no edema and no tenderness.  Lymphadenopathy:    She has no cervical adenopathy.  Neurological: She is alert and oriented to person, place, and time. She exhibits normal muscle tone. Coordination normal.  Skin: Skin is warm and dry. No rash noted. No erythema. No pallor.  Psychiatric: She has a normal mood and affect. Her behavior is normal. Judgment and thought content normal.    ED Course  Procedures (including critical care time) Labs Reviewed  CBC WITH DIFFERENTIAL - Abnormal; Notable for the following:    WBC 13.5 (*)    RBC 5.58 (*)    HCT 46.5 (*)    RDW 16.3 (*)    Neutro Abs 9.8 (*)    Monocytes Absolute 1.1 (*)    All  other components within normal limits  COMPREHENSIVE METABOLIC PANEL - Abnormal; Notable for the following:    Glucose, Bld 118 (*)    All other components within normal limits  PROTIME-INR - Abnormal; Notable for the following:    Prothrombin Time 23.0 (*)    INR 2.14 (*)    All other components within normal limits  URINALYSIS, ROUTINE W REFLEX MICROSCOPIC - Abnormal; Notable for the following:    Color, Urine AMBER (*)    APPearance CLOUDY (*)    Bilirubin Urine SMALL (*)    Leukocytes, UA MODERATE (*)    All other components within normal limits  URINE MICROSCOPIC-ADD ON - Abnormal; Notable for the following:    Squamous Epithelial / LPF FEW (*)    Bacteria, UA FEW (*)    All other components within normal limits  URINE CULTURE  TSH   Ct Abdomen Pelvis W Contrast  04/16/2013   *RADIOLOGY REPORT*  Clinical Data: Abdominal pain.  History diverticulitis.  CT ABDOMEN AND PELVIS WITH CONTRAST  Technique:  Multidetector CT imaging of the abdomen and pelvis was performed following the standard protocol during bolus administration of intravenous contrast.  Contrast: OMNIPAQUE IOHEXOL 300 MG/ML  SOLN  Comparison: 02/26/1947  Findings: Lung bases are clear.  No effusions.  Heart is normal size.  Prior cholecystectomy.  Low density area adjacent to the falciform ligament is stable since prior study, likely focal fatty infiltration.  Tiny low density areas within the spleen are stable, likely benign.  Pancreas, adrenals, kidneys are unremarkable.  Inflammatory changes noted around the sigmoid colon compatible with active diverticulitis.  Diffuse wall thickening throughout the sigmoid colon likely related to the diverticulosis and muscular hypertrophy as well as the inflammatory change related to diverticulitis.  Small amount of free fluid in the pelvis.  Prior hysterectomy.  No adnexal masses.  Urinary bladder is unremarkable.  Small bowel is decompressed.  IMPRESSION: Extensive sigmoid  diverticulosis with associated muscular hypertrophy and wall thickening.  There is also inflammatory change around the sigmoid colon compatible with active diverticulitis.   Original Report Authenticated By: Charlett Nose, M.D.   1. Diverticulitis of sigmoid colon   2. Nausea and vomiting   3. Constipation   4. Chronic narcotic dependence     MDM  58 year old female with acute on pain along with constipation.  Suspect diverticulitis.  We'll get labs and plan for CT scan.  Expect she'll need admission given her persistent vomiting  Olivia Mackie, MD 04/16/13 [phone]

## 2013-04-16 NOTE — Care Management Note (Signed)
UR utilization.   Queena Monrreal,RN,BSN [id number]

## 2013-04-16 NOTE — Care Management Note (Signed)
CARE MANAGEMENT NOTE 04/16/2013  Patient:  [name], [name]   Account Number:  [id number]  Date Initiated:  04/16/2013  Documentation initiated by:  Larene Ascencio  Subjective/Objective Assessment:   57 yo female admitted with diverticulitis. PTA pt independent. PCP PCP:   Thayer Headings, MD follows coumadin.     Action/Plan:   Home when stable   Anticipated DC Date:     Anticipated DC Plan:  HOME/SELF CARE      DC Planning Services  CM consult      Choice offered to / List presented to:  NA   DME arranged  NA      DME agency  NA     HH arranged  NA      HH agency  NA   Status of service:  In process, will continue to follow Medicare Important Message given?   (If response is "NO", the following Medicare IM given date fields will be blank) Date Medicare IM given:   Date Additional Medicare IM given:    Discharge Disposition:    Per UR Regulation:  Reviewed for med. necessity/level of care/duration of stay  If discussed at Long Length of Stay Meetings, dates discussed:    Comments:  04/16/13 Leonie Green [phone] No needs identified at this time.

## 2013-04-16 NOTE — Progress Notes (Signed)
ANTICOAGULATION CONSULT NOTE - Initial Consult  Pharmacy Consult for Warfarin Indication: Hx PE x 3  Allergies  Allergen Reactions  . Shellfish Allergy Hives and Nausea And Vomiting  . Tape     Plastic tape irritates skin    Patient Measurements: Height: 5\' 3"  (160 cm) Weight: 221 lb 14.4 oz (100.653 kg) IBW/kg (Calculated) : 52.4   Vital Signs: Temp: 98.3 F (36.8 C) (06/24 0455) Temp src: Oral (06/24 0455) BP: 125/82 mmHg (06/24 0455) Pulse Rate: 92 (06/24 0455)  Labs:  Recent Labs  04/16/13 0010  HGB 14.9  HCT 46.5*  PLT 289  LABPROT 23.0*  INR 2.14*  CREATININE 0.63    Estimated Creatinine Clearance: 87.8 ml/min (by C-G formula based on Cr of 0.63).   Medical History: Past Medical History  Diagnosis Date  . Asthma   . Pulmonary embolism   . High cholesterol   . Acid reflux   . Pneumonia   . Heart murmur   . Hiatal hernia   . Diverticulitis     Medications:  Scheduled:  . sodium chloride   Intravenous Once  . sodium chloride   Intravenous STAT  . ciprofloxacin  400 mg Intravenous Q12H  . ipratropium  0.5 mg Nebulization TID  . lidocaine  1-3 patch Transdermal Q24H  . metronidazole  500 mg Intravenous Q8H  . morphine  30 mg Oral Q12H  . pantoprazole  40 mg Oral Daily  . simvastatin  10 mg Oral q1800  . warfarin  5 mg Oral Once  . Warfarin - Pharmacist Dosing Inpatient   Does not apply q1800   Infusions:  . dextrose 5 % and 0.9% NaCl      Assessment: 57 yo admitted with diffuse abdominal pain on chronic coumadin for hx of PE x3.   Goal of Therapy:  INR 2-3    Plan:   Warfarin 5mg  x1 this am  Daily PT/INR  Education  Susanne Greenhouse R 04/16/2013,5:51 AM

## 2013-04-16 NOTE — H&P (Signed)
[facility] History and Physical  [name] ZOX:[id number] DOB: 08-02-1956    PCP:   Thayer Headings, MD   Chief Complaint: diffuse abdominal pain.  HPI: [name] is an 57 y.o. female obese female with hx of diverticulosis and diverticulitis, last colonoscopy 5 years ago, and is due for repeated, hx of pulmonary embolism x 3, on chronic anticoagulation with therapeutic INR, asthma, chronic back pain after several back surgeries on Opana, presents to the ER with diffuse abdominal pain.  She said she had it for a few days now.  She also has constiptation and took one bottle of MagCitrate along with some dulcolax today as well.  She denied fever or chills, but has some nausea and vomiting.  Evaluation in the ER included a normal WBC with Hb of 9.8 grams/DL, and normal electrolytes and nornal renal fx tests.  An abdominal pelvic CT showed active sigmoid diverticulitis with no abscess nor perforation. Her UA is also indicative of a UTI.   Hospitalist was asked to admit her for diverticulitis with nausea, vomiting, UTI, and constipation due to chronic narcotic use.  Rewiew of Systems:  Constitutional: Negative for malaise, fever and chills. No significant weight loss or weight gain Eyes: Negative for eye pain, redness and discharge, diplopia, visual changes, or flashes of light. ENMT: Negative for ear pain, hoarseness, nasal congestion, sinus pressure and sore throat. No headaches; tinnitus, drooling, or problem swallowing. Cardiovascular: Negative for chest pain, palpitations, diaphoresis, dyspnea and peripheral edema. ; No orthopnea, PND Respiratory: Negative for cough, hemoptysis, wheezing and stridor. No pleuritic chestpain. Gastrointestinal: Negative for diarrhea,  melena, blood in stool, hematemesis, jaundice and rectal bleeding.    Genitourinary: Negative for frequency, dysuria, incontinence,flank pain and hematuria; Musculoskeletal: Negative for back pain and neck pain.  Negative for swelling and trauma.;  Skin: . Negative for pruritus, rash, abrasions, bruising and skin lesion.; ulcerations Neuro: Negative for headache, lightheadedness and neck stiffness. Negative for weakness, altered level of consciousness , altered mental status, extremity weakness, burning feet, involuntary movement, seizure and syncope.  Psych: negative for anxiety, depression, insomnia, tearfulness, panic attacks, hallucinations, paranoia, suicidal or homicidal ideation    Past Medical History  Diagnosis Date  . Asthma   . Pulmonary embolism   . High cholesterol   . Acid reflux   . Pneumonia   . Heart murmur   . Hiatal hernia   . Diverticulitis     Past Surgical History  Procedure Laterality Date  . Replacement total knee    . Cholecystectomy    . Abdominal hysterectomy    . Back surgery    . Joint replacement    . Carpal tunnel release    .  bone spurs removed from feet    . Tubal ligation      Medications:  HOME MEDS: Prior to Admission medications   Medication Sig Start Date End Date Taking? Authorizing Provider  albuterol (PROVENTIL HFA;VENTOLIN HFA) 108 (90 BASE) MCG/ACT inhaler Inhale 2 puffs into the lungs every 6 (six) hours as needed. For wheezing 02/28/13  Yes Renae Fickle, MD  cyclobenzaprine (FLEXERIL) 10 MG tablet Take 10 mg by mouth 3 (three) times daily as needed. For muscle spasms   Yes Historical Provider, MD  diclofenac sodium (VOLTAREN) 1 % GEL Apply 2 g topically 4 (four) times daily as needed. For pain (shoulder,knees, back, neck)   Yes Historical Provider, MD  furosemide (LASIX) 40 MG tablet Take 40 mg by mouth 2 (two) times daily as needed.  For fluid retention.   Yes Historical Provider, MD  ipratropium (ATROVENT) 0.02 % nebulizer solution Take 2.5 mLs (0.5 mg total) by nebulization 3 (three) times daily. 02/28/13  Yes Renae Fickle, MD  isometheptene-acetaminophen-dichloralphenazone (MIDRIN) 65-325-100 MG capsule Take 1-2 capsules by mouth 4  (four) times daily as needed. For headaches. Max dose is 5 tabs in 24 hrs. Take 2 tabs at the onset of headache then take 1 tab each hour if needed.   Yes Historical Provider, MD  lidocaine (LIDODERM) 5 % Place 1-3 patches onto the skin daily. Remove & Discard patch within 12 hours or as directed by MD   Yes Historical Provider, MD  LORazepam (ATIVAN) 2 MG tablet Take 2 mg by mouth 2 (two) times daily as needed for anxiety.   Yes Historical Provider, MD  omeprazole (PRILOSEC) 40 MG capsule Take 40 mg by mouth 2 (two) times daily.    Yes Historical Provider, MD  oxyCODONE-acetaminophen (PERCOCET) 10-325 MG per tablet Take 1-2 tablets by mouth every 4 (four) hours as needed for pain (For breakthrough pain). For pain   Yes Historical Provider, MD  oxymorphone (OPANA ER) 30 MG 12 hr tablet Take 30 mg by mouth every 12 (twelve) hours.   Yes Historical Provider, MD  pravastatin (PRAVACHOL) 20 MG tablet Take 20 mg by mouth daily.   Yes Historical Provider, MD  promethazine (PHENERGAN) 25 MG tablet Take 25 mg by mouth every 6 (six) hours as needed for nausea.   Yes Historical Provider, MD  warfarin (COUMADIN) 5 MG tablet Take 2.5-5 mg by mouth daily. Take 1 tablet Monday, Wednesday, and Friday. Take 1/2 tablet every other day.   Yes Historical Provider, MD     Allergies:  Allergies  Allergen Reactions  . Shellfish Allergy Hives and Nausea And Vomiting  . Tape     Plastic tape irritates skin    Social History:   reports that she has never smoked. She does not have any smokeless tobacco history on file. She reports that she does not drink alcohol or use illicit drugs.  Family History: Family History  Problem Relation Age of Onset  . Heart failure Mother   . COPD Mother   . Heart failure Father   . COPD Father   . Cancer Other   . Heart failure Other      Physical Exam: Filed Vitals:   04/15/13 2231 04/16/13 0246  BP: 129/94 156/85  Pulse: 112 86  Temp: 98.9 F (37.2 C) 98.1 F (36.7  C)  TempSrc: Oral Oral  Resp: 16 20  SpO2: 98% 100%   Blood pressure 156/85, pulse 86, temperature 98.1 F (36.7 C), temperature source Oral, resp. rate 20, SpO2 100.00%.  GEN:  Pleasant  patient lying in the stretcher in no acute distress; cooperative with exam. PSYCH:  alert and oriented x4; does not appear anxious or depressed; affect is appropriate. HEENT: Mucous membranes pink and anicteric; PERRLA; EOM intact; no cervical lymphadenopathy nor thyromegaly or carotid bruit; no JVD; There were no stridor. Neck is very supple. Breasts:: Not examined CHEST WALL: No tenderness CHEST: Normal respiration, clear to auscultation bilaterally.  HEART: Regular rate and rhythm.  There are no murmur, rub, or gallops.   BACK: No kyphosis or scoliosis; no CVA tenderness ABDOMEN: soft and difusely tender worse over the LLQ. no masses, no organomegaly, normal abdominal bowel sounds; no pannus; no intertriginous candida. There is no rebound and no distention. Rectal Exam: Not done EXTREMITIES: No bone or joint deformity;  age-appropriate arthropathy of the hands and knees; no edema; no ulcerations.  There is no calf tenderness. Genitalia: not examined PULSES: 2+ and symmetric SKIN: Normal hydration no rash or ulceration CNS: Cranial nerves 2-12 grossly intact no focal lateralizing neurologic deficit.  Speech is fluent; uvula elevated with phonation, facial symmetry and tongue midline. DTR are normal bilaterally, cerebella exam is intact, barbinski is negative and strengths are equaled bilaterally.  No sensory loss.   Labs on Admission:  Basic Metabolic Panel:  Recent Labs Lab 04/16/13 0010  NA 138  K 3.7  CL 100  CO2 28  GLUCOSE 118*  BUN 7  CREATININE 0.63  CALCIUM 9.4   Liver Function Tests:  Recent Labs Lab 04/16/13 0010  AST 17  ALT 10  ALKPHOS 106  BILITOT 0.9  PROT 7.7  ALBUMIN 3.7   No results found for this basename: LIPASE, AMYLASE,  in the last 168 hours No results  found for this basename: AMMONIA,  in the last 168 hours CBC:  Recent Labs Lab 04/16/13 0010  WBC 13.5*  NEUTROABS 9.8*  HGB 14.9  HCT 46.5*  MCV 83.3  PLT 289   Cardiac Enzymes: No results found for this basename: CKTOTAL, CKMB, CKMBINDEX, TROPONINI,  in the last 168 hours  CBG: No results found for this basename: GLUCAP,  in the last 168 hours   Radiological Exams on Admission: Ct Abdomen Pelvis W Contrast  04/16/2013   *RADIOLOGY REPORT*  Clinical Data: Abdominal pain.  History diverticulitis.  CT ABDOMEN AND PELVIS WITH CONTRAST  Technique:  Multidetector CT imaging of the abdomen and pelvis was performed following the standard protocol during bolus administration of intravenous contrast.  Contrast: OMNIPAQUE IOHEXOL 300 MG/ML  SOLN  Comparison: 02/26/1947  Findings: Lung bases are clear.  No effusions.  Heart is normal size.  Prior cholecystectomy.  Low density area adjacent to the falciform ligament is stable since prior study, likely focal fatty infiltration.  Tiny low density areas within the spleen are stable, likely benign.  Pancreas, adrenals, kidneys are unremarkable.  Inflammatory changes noted around the sigmoid colon compatible with active diverticulitis.  Diffuse wall thickening throughout the sigmoid colon likely related to the diverticulosis and muscular hypertrophy as well as the inflammatory change related to diverticulitis.  Small amount of free fluid in the pelvis.  Prior hysterectomy.  No adnexal masses.  Urinary bladder is unremarkable.  Small bowel is decompressed.  IMPRESSION: Extensive sigmoid diverticulosis with associated muscular hypertrophy and wall thickening.  There is also inflammatory change around the sigmoid colon compatible with active diverticulitis.   Original Report Authenticated By: Charlett Nose, M.D.    Assessment/Plan Present on Admission:  . Diverticulitis of sigmoid colon . Personal history of PE (pulmonary embolism) . UTI (urinary tract  infection) . Asthma . Obesity . Constipation . Chronic narcotic dependence  PLAN:  I will admit her and place her on clear liquid for sigmoid diverticulitis.  She will need a follow up colonoscopy also.  Will tx with Cipro and Flagyl IV.  This should cover her UTI also.  For her hx of three pulmonary emboli, will continue her Coumadin as she will need anticoagulation for life.  Her constipation hopefully will resolved with the Mag and dulcolax she took.  She is stable, full code, and will be admitted to [facility] service.  Thank you for allowing me to participate in the care of this nice patient.  Other plans as per orders.  Code Status:FULL CODE.   Houston Siren,  MD. [facility] Pager [phone] 7pm to 7am.  04/16/2013, 3:02 AM

## 2013-04-16 NOTE — Progress Notes (Signed)
Cross coverage note:  Patient complaiing of increased pain in her abdomen. Her medication list included Oral oxycodone 5 mg PO q4h prn and dilaudid 1 mg IV prn Q4h. Patient is on percocet 10-325 at home q4h PRN.  Order: To simplify the pain medication regimen I changed PRN meds to dilaudid 1-2 mg q4h PRN and discontinued oral pain meds. We can review pain meds tomorrow morning after seeing how she does tonight.  Lars Mage MD

## 2013-04-16 NOTE — Progress Notes (Signed)
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   Assessment/Plan: Acute sigmoid diverticulitis Started on IV cipro and flagyl ( day 1) Pain control with prn dilaudid, MS contin. continue dicyclomine. Clear liquid diet. zofran prn for nausea Reports last colonoscopy 5 years back ( Dr Vincent Peyer) . Follow up with GI as outpt  Chronic PE  on coumadin. INR therapeutic  Asthma Continue albuterol and atrovent   Code Status: full code Family Communication: none at bedside Disposition Plan: home once improved   Consultants:  none  Procedures:  none  Antibiotics:  IV cipro and flagyl ( day 1)  HPI/Subjective: C/o left LLQ pain and nausea.  Objective: Filed Vitals:   04/16/13 0330 04/16/13 0345 04/16/13 0455 04/16/13 0907  BP: 139/79 131/79 125/82   Pulse: 91 93 92   Temp:   98.3 F (36.8 C)   TempSrc:   Oral   Resp:   14   Height:   5\' 3"  (1.6 m)   Weight:   100.653 kg (221 lb 14.4 oz)   SpO2: 94% 92% 93% 92%    Intake/Output Summary (Last 24 hours) at 04/16/13 1312 Last data filed at 04/16/13 1243  Gross per 24 hour  Intake    300 ml  Output      2 ml  Net    298 ml   Filed Weights   04/16/13 0455  Weight: 100.653 kg (221 lb 14.4 oz)    Exam:   General:  Middle aged female lying in bed in NAD  HEENT: no pallor, moist mucosa  Chest: clear to auscultation b/l, no added sounds  Abd: Soft, ND, BS+, tender to palpation over LLQ  Ext: warm, no edema  CNS: AAOX3  Data Reviewed: Basic Metabolic Panel:  Recent Labs Lab 04/16/13 0010  NA 138  K 3.7  CL 100  CO2 28  GLUCOSE 118*  BUN 7  CREATININE 0.63  CALCIUM 9.4   Liver Function Tests:  Recent Labs Lab 04/16/13 0010   AST 17  ALT 10  ALKPHOS 106  BILITOT 0.9  PROT 7.7  ALBUMIN 3.7   No results found for this basename: LIPASE, AMYLASE,  in the last 168 hours No results found for this basename: AMMONIA,  in the last 168 hours CBC:  Recent Labs Lab 04/16/13 0010  WBC 13.5*  NEUTROABS 9.8*  HGB 14.9  HCT 46.5*  MCV 83.3  PLT 289   Cardiac Enzymes: No results found for this basename: CKTOTAL, CKMB, CKMBINDEX, TROPONINI,  in the last 168 hours BNP (last 3 results) No results found for this basename: PROBNP,  in the last 8760 hours CBG: No results found for this basename: GLUCAP,  in the last 168 hours  No results found for this or any previous visit (from the past 240 hour(s)).   Studies: Ct Abdomen Pelvis W Contrast  04/16/2013   *RADIOLOGY REPORT*  Clinical Data: Abdominal pain.  History diverticulitis.  CT ABDOMEN AND PELVIS WITH CONTRAST  Technique:  Multidetector CT imaging of the abdomen and pelvis was performed following the standard protocol during bolus administration of intravenous contrast.  Contrast: OMNIPAQUE IOHEXOL 300 MG/ML  SOLN  Comparison: 02/26/1947  Findings: Lung bases are clear.  No effusions.  Heart is normal size.  Prior  cholecystectomy.  Low density area adjacent to the falciform ligament is stable since prior study, likely focal fatty infiltration.  Tiny low density areas within the spleen are stable, likely benign.  Pancreas, adrenals, kidneys are unremarkable.  Inflammatory changes noted around the sigmoid colon compatible with active diverticulitis.  Diffuse wall thickening throughout the sigmoid colon likely related to the diverticulosis and muscular hypertrophy as well as the inflammatory change related to diverticulitis.  Small amount of free fluid in the pelvis.  Prior hysterectomy.  No adnexal masses.  Urinary bladder is unremarkable.  Small bowel is decompressed.  IMPRESSION: Extensive sigmoid diverticulosis with associated muscular hypertrophy and wall  thickening.  There is also inflammatory change around the sigmoid colon compatible with active diverticulitis.   Original Report Authenticated By: Charlett Nose, M.D.    Scheduled Meds: . sodium chloride   Intravenous Once  . sodium chloride   Intravenous STAT  . ciprofloxacin  400 mg Intravenous Q12H  . ipratropium  0.5 mg Nebulization TID  . lidocaine  1-3 patch Transdermal Q24H  . metronidazole  500 mg Intravenous Q8H  . morphine  30 mg Oral Q12H  . pantoprazole  40 mg Oral Daily  . [START ON 04/17/2013] pneumococcal 23 valent vaccine  0.5 mL Intramuscular Tomorrow-1000  . simvastatin  10 mg Oral q1800  . Warfarin - Pharmacist Dosing Inpatient   Does not apply q1800   Continuous Infusions: . dextrose 5 % and 0.9% NaCl        Time spent: 25 minutes    Yulian Gosney  [facility] Pager [phone]. If 7PM-7AM, please contact night-coverage at [email or web address], password [email or web address] 04/16/2013, 1:12 PM  LOS: 1 day

## 2013-04-17 DIAGNOSIS — E785 Hyperlipidemia, unspecified: Secondary | ICD-10-CM

## 2013-04-17 DIAGNOSIS — N390 Urinary tract infection, site not specified: Secondary | ICD-10-CM | POA: Diagnosis present

## 2013-04-17 DIAGNOSIS — K219 Gastro-esophageal reflux disease without esophagitis: Secondary | ICD-10-CM

## 2013-04-17 DIAGNOSIS — I2782 Chronic pulmonary embolism: Secondary | ICD-10-CM | POA: Diagnosis present

## 2013-04-17 LAB — CBC
MCH: 26 pg (ref 26.0–34.0)
MCHC: 30.5 g/dL (ref 30.0–36.0)
Platelets: 220 10*3/uL (ref 150–400)

## 2013-04-17 LAB — URINE CULTURE

## 2013-04-17 LAB — PROTIME-INR
INR: 2.28 — ABNORMAL HIGH (ref 0.00–1.49)
Prothrombin Time: 24.1 seconds — ABNORMAL HIGH (ref 11.6–15.2)

## 2013-04-17 MED ORDER — CLINDAMYCIN PHOSPHATE 600 MG/50ML IV SOLN
600.0000 mg | Freq: Three times a day (TID) | INTRAVENOUS | Status: DC
  Administered 2013-04-17 – 2013-04-18 (×3): 600 mg via INTRAVENOUS
  Filled 2013-04-17 (×4): qty 50

## 2013-04-17 MED ORDER — HYDROMORPHONE HCL PF 1 MG/ML IJ SOLN
1.0000 mg | INTRAMUSCULAR | Status: DC | PRN
  Administered 2013-04-17 – 2013-04-18 (×6): 2 mg via INTRAVENOUS
  Filled 2013-04-17 (×6): qty 2

## 2013-04-17 MED ORDER — PROMETHAZINE HCL 25 MG/ML IJ SOLN
25.0000 mg | Freq: Four times a day (QID) | INTRAMUSCULAR | Status: DC | PRN
  Administered 2013-04-17 – 2013-04-19 (×6): 25 mg via INTRAVENOUS
  Filled 2013-04-17 (×6): qty 1

## 2013-04-17 MED ORDER — SODIUM CHLORIDE 0.9 % IV SOLN
Freq: Once | INTRAVENOUS | Status: AC
  Administered 2013-04-17: 12:00:00 via INTRAVENOUS

## 2013-04-17 MED ORDER — WARFARIN SODIUM 5 MG PO TABS
5.0000 mg | ORAL_TABLET | Freq: Once | ORAL | Status: AC
  Administered 2013-04-17: 5 mg via ORAL
  Filled 2013-04-17: qty 1

## 2013-04-17 NOTE — Progress Notes (Signed)
ANTICOAGULATION CONSULT NOTE - Follow Up  Pharmacy Consult for Warfarin Indication: Hx PE x 3  Allergies  Allergen Reactions  . Shellfish Allergy Hives and Nausea And Vomiting  . Tape     Plastic tape irritates skin   Patient Measurements: Height: 5\' 3"  (160 cm) Weight: 221 lb 14.4 oz (100.653 kg) IBW/kg (Calculated) : 52.4  Labs:  Recent Labs  04/16/13 0010 04/17/13 0435  HGB 14.9 12.0  HCT 46.5* 39.3  PLT 289 220  LABPROT 23.0* 24.1*  INR 2.14* 2.28*  CREATININE 0.63  --     Estimated Creatinine Clearance: 87.8 ml/min (by C-G formula based on Cr of 0.63).  Medications:  Scheduled:  . sodium chloride   Intravenous Once  . ciprofloxacin  400 mg Intravenous Q12H  . clindamycin (CLEOCIN) IV  600 mg Intravenous Q8H  . ipratropium  0.5 mg Nebulization TID  . lidocaine  1-3 patch Transdermal Q24H  . morphine  30 mg Oral Q12H  . pantoprazole  40 mg Oral Daily  . pneumococcal 23 valent vaccine  0.5 mL Intramuscular Tomorrow-1000  . simvastatin  10 mg Oral q1800  . Warfarin - Pharmacist Dosing Inpatient   Does not apply q1800   Infusions:  . dextrose 5 % and 0.9% NaCl 50 mL/hr at 04/17/13 0400    Assessment: 58 yo admitted 6/24am with diffuse abdominal pain. On chronic coumadin for hx of PE x3. Home warfarin dose reported as 5mg  MWF and 2.5mg  TuThSS - last dose 6/22 (missed 6/23 dose). INR was therapeutic on admission and remains therapeutic today. Received warfarin 5mg  x1 on 6/24. No bleeding noted in chart notes. Will continue to follow home regimen while in hospital, but monitor INR response closely given drug interaction with Cipro (possible increase INR response); might require smaller warfarin dose in the near future.  Goal of Therapy:  INR 2-3    Plan:   Warfarin 5mg  x1 at 18:00  Daily PT/INR  Darrol Angel, PharmD Pager: [phone] 04/17/2013,8:17 AM

## 2013-04-17 NOTE — Progress Notes (Signed)
PHARMACY - BRIEF NOTE  64 YOF with acute diverticulitis on ciprofloxacin/metronidazole IV.  Due to national backorder of IV metronidazole and dwindling supply at [facility] pharmacy, will implement automatic substitution policy  Plan:  Per P&T emergency substitution (no documentation of C. Difficile or work-up for C. Difficile), change metronidazole IV to clindamycin 600mg  IV q8h  Once able to take PO, suggest PO metronidazole.  Thanks, Juliette Alcide, PharmD, BCPS.   Pager: [phone] 04/17/2013 7:40 AM

## 2013-04-17 NOTE — Progress Notes (Signed)
[facility] PROGRESS NOTE  [name] WUJ:[id number] DOB: [birth year] DOA: 04/15/2013 PCP: Thayer Headings, MD  Brief narrative: 57 -year-old female with past medical history of morbid obesity, sigmoid diverticulosis, constipation and chronic narcotic dependence, history of pulmonary embolism on Coumadin who presented to [facility] ED 04/15/2013 with diffuse abdominal pain for past few days prior to this admission. Patient did not have associated fever or chills. She did report nausea and vomiting. In ED, vital signs were stable. White blood cell count was elevated at 13.5 and BMP was essentially unremarkable. INR was within normal, therapeutic level.  Assessment/Plan:  Principal Problem:   Diverticulitis of sigmoid colon - Continue ciprofloxacin and clindamycin - Pain regimen is with Dilaudid 1-2 mg every 3 hours IV as needed for severe pain - Continue IV fluids - Clear liquid diet Active Problems:   Personal history of PE (pulmonary embolism) - Coumadin per pharmacy - INR therapeutic   Asthma - Stable   UTI (urinary tract infection) - secondary to enterococcal UTI, followup the sensitivity report   Obesity - discussed diet with the patient   Constipation - Had one bowel movement yesterday   Chronic narcotic dependence - Current pain regimen is with Dilaudid 1-2 mg every 3 hours as needed for severe pain.    Code Status: full code Family Communication: no family at the bedside Disposition Plan: home when stable  Manson Passey, MD  [facility] Pager [phone]  If 7PM-7AM, please contact night-coverage [email or web address] Password [facility] 04/17/2013, 11:58 AM   LOS: 2 days   Consultants:  None   Procedures:  None   Antibiotics:  Cipro 04/15/2013 -->  Clindamycin 04/15/2013 -->  HPI/Subjective: Still in pain and more nauseas today.   Objective: Filed Vitals:   04/16/13 1954 04/16/13 2145 04/17/13 0605 04/17/13 0807  BP:  118/73 113/75   Pulse:  89 85   Temp:  98.5 F  (36.9 C) 97.9 F (36.6 C)   TempSrc:  Oral Oral   Resp:  18 20   Height:      Weight:      SpO2: 96% 97% 97% 94%    Intake/Output Summary (Last 24 hours) at 04/17/13 1158 Last data filed at 04/17/13 1015  Gross per 24 hour  Intake   2160 ml  Output   1851 ml  Net    309 ml    Exam:   General:  Pt is alert, follows commands appropriately, not in acute distress  Cardiovascular: Regular rate and rhythm, S1/S2, no murmurs, no rubs, no gallops  Respiratory: Clear to auscultation bilaterally, no wheezing, no crackles, no rhonchi  Abdomen: Soft, obese, tender in mid and lower abdomen, non distended, bowel sounds present, no guarding  Extremities: No edema, pulses DP and PT palpable bilaterally  Neuro: Grossly nonfocal  Data Reviewed: Basic Metabolic Panel:  Recent Labs Lab 04/16/13 0010  NA 138  K 3.7  CL 100  CO2 28  GLUCOSE 118*  BUN 7  CREATININE 0.63  CALCIUM 9.4   Liver Function Tests:  Recent Labs Lab 04/16/13 0010  AST 17  ALT 10  ALKPHOS 106  BILITOT 0.9  PROT 7.7  ALBUMIN 3.7   No results found for this basename: LIPASE, AMYLASE,  in the last 168 hours No results found for this basename: AMMONIA,  in the last 168 hours CBC:  Recent Labs Lab 04/16/13 0010 04/17/13 0435  WBC 13.5* 8.6  NEUTROABS 9.8*  --   HGB 14.9 12.0  HCT 46.5* 39.3  MCV 83.3  85.1  PLT 289 220   Cardiac Enzymes: No results found for this basename: CKTOTAL, CKMB, CKMBINDEX, TROPONINI,  in the last 168 hours BNP: No components found with this basename: POCBNP,  CBG: No results found for this basename: GLUCAP,  in the last 168 hours  Recent Results (from the past 240 hour(s))  URINE CULTURE     Status: None   Collection Time    04/15/13 11:31 PM      Result Value Range Status   Specimen Description URINE, CLEAN CATCH   Final   Special Requests NONE   Final   Culture  Setup Time 04/16/2013 04:35   Final   Colony Count >=100,000 COLONIES/ML   Final   Culture  ENTEROCOCCUS SPECIES   Final   Report Status PENDING   Incomplete     Studies: Ct Abdomen Pelvis W Contrast 04/16/2013   *IMPRESSION: Extensive sigmoid diverticulosis with associated muscular hypertrophy and wall thickening.  There is also inflammatory change around the sigmoid colon compatible with active diverticulitis.     Scheduled Meds: . ciprofloxacin  400 mg Intravenous Q12H  . clindamycin (CLEOCIN)   600 mg Intravenous Q8H  . ipratropium  0.5 mg Nebulization TID  . morphine  30 mg Oral Q12H  . pantoprazole  40 mg Oral Daily  . simvastatin  10 mg Oral q1800  . warfarin  5 mg Oral ONCE-1800

## 2013-04-18 DIAGNOSIS — I2782 Chronic pulmonary embolism: Secondary | ICD-10-CM

## 2013-04-18 LAB — PROTIME-INR
INR: 2.78 — ABNORMAL HIGH (ref 0.00–1.49)
Prothrombin Time: 28.4 seconds — ABNORMAL HIGH (ref 11.6–15.2)

## 2013-04-18 MED ORDER — POLYETHYLENE GLYCOL 3350 17 G PO PACK
17.0000 g | PACK | Freq: Three times a day (TID) | ORAL | Status: DC
  Administered 2013-04-18 – 2013-04-19 (×5): 17 g via ORAL
  Filled 2013-04-18 (×8): qty 1

## 2013-04-18 MED ORDER — WARFARIN SODIUM 2.5 MG PO TABS
2.5000 mg | ORAL_TABLET | Freq: Once | ORAL | Status: AC
  Administered 2013-04-18: 2.5 mg via ORAL
  Filled 2013-04-18: qty 1

## 2013-04-18 MED ORDER — HYDROMORPHONE HCL PF 1 MG/ML IJ SOLN
1.0000 mg | INTRAMUSCULAR | Status: DC | PRN
  Administered 2013-04-18 (×4): 1 mg via INTRAVENOUS
  Administered 2013-04-19 (×3): 2 mg via INTRAVENOUS
  Filled 2013-04-18: qty 2
  Filled 2013-04-18 (×2): qty 1
  Filled 2013-04-18 (×2): qty 2
  Filled 2013-04-18 (×2): qty 1

## 2013-04-18 MED ORDER — AMOXICILLIN-POT CLAVULANATE 875-125 MG PO TABS
1.0000 | ORAL_TABLET | Freq: Two times a day (BID) | ORAL | Status: DC
  Administered 2013-04-18 – 2013-04-19 (×3): 1 via ORAL
  Filled 2013-04-18 (×4): qty 1

## 2013-04-18 MED ORDER — METRONIDAZOLE 500 MG PO TABS
500.0000 mg | ORAL_TABLET | Freq: Three times a day (TID) | ORAL | Status: DC
  Administered 2013-04-18 – 2013-04-19 (×3): 500 mg via ORAL
  Filled 2013-04-18 (×6): qty 1

## 2013-04-18 MED ORDER — DOCUSATE SODIUM 100 MG PO CAPS
100.0000 mg | ORAL_CAPSULE | Freq: Two times a day (BID) | ORAL | Status: DC
  Administered 2013-04-18 – 2013-04-19 (×3): 100 mg via ORAL
  Filled 2013-04-18 (×4): qty 1

## 2013-04-18 NOTE — Progress Notes (Addendum)
[facility] PROGRESS NOTE  [name] ZOX:[id number] DOB: 15-Dec-1955 DOA: 04/15/2013 PCP: Thayer Headings, MD  Brief narrative: 57 -year-old female with past medical history of morbid obesity, sigmoid diverticulosis, constipation and chronic narcotic dependence, history of pulmonary embolism on Coumadin who presented to [facility] ED 04/15/2013 with diffuse abdominal pain for past few days prior to this admission. Patient did not have associated fever or chills. She did report nausea and vomiting.  In ED, vital signs were stable. White blood cell count was elevated at 13.5 and BMP was essentially unremarkable. INR was within normal, therapeutic level.   Assessment/Plan:   Principal Problem:  Diverticulitis of sigmoid colon  - For better coverage We will use Flagyl and Augmentin. Augmentin will cover for enterococcus UTI as well - Pain regimen is with Dilaudid 1-2 mg every 3 hours IV as needed for severe pain but we will decrease frequency to every 4 hours as needed. Patient will drowsy this morning. - Discontinue IV fluids and advance diet as tolerated Active Problems:  Personal history of PE (pulmonary embolism)  - Coumadin per pharmacy  - INR therapeutic  Asthma  - Stable  UTI (urinary tract infection)  - secondary to enterococcal UTI - Start Augmentin today Obesity  - discussed diet with the patient  Constipation  - Had one bowel movement in past 24 hours. - Use Colace 100 mg twice daily and MiraLAX 4 times a day Chronic narcotic dependence  - Current pain regimen is with Dilaudid 1-2 mg every 4 hours as needed for severe pain.   Code Status: full code  Family Communication: no family at the bedside  Disposition Plan: home when stable   Manson Passey, MD  [facility]  Pager [phone]   Consultants:  None  Procedures:  None  Antibiotics:  Flagyl 04/18/2013 --> Augmentin 04/18/2013 --> Cipro 04/15/2013 --> 04/18/2013 Clindamycin 04/15/2013 --> 04/18/2013   If 7PM-7AM, please  contact night-coverage [email or web address] Password [email or web address] 04/18/2013, 10:31 AM   LOS: 3 days    HPI/Subjective: Patient is sleeping. She is easily arousable but falls back asleep.  Objective: Filed Vitals:   04/17/13 1945 04/17/13 2220 04/18/13 0505 04/18/13 0733  BP:  118/74 98/62   Pulse:  88 93   Temp:  98.9 F (37.2 C) 98.7 F (37.1 C)   TempSrc:  Oral Oral   Resp:  18 18   Height:      Weight:      SpO2: 91% 96% 95% 94%    Intake/Output Summary (Last 24 hours) at 04/18/13 1031 Last data filed at 04/18/13 0524  Gross per 24 hour  Intake   1030 ml  Output   2100 ml  Net  -1070 ml    Exam:   General:  Pt is alert, follows commands appropriately, not in acute distress  Cardiovascular: Regular rate and rhythm, S1/S2, no murmurs, no rubs, no gallops  Respiratory: Clear to auscultation bilaterally, no wheezing, no crackles, no rhonchi  Abdomen: Soft, tender in mid to lower abdomen, non distended, bowel sounds present, no guarding  Extremities: No edema, pulses DP and PT palpable bilaterally  Neuro: Grossly nonfocal  Data Reviewed: Basic Metabolic Panel:  Recent Labs Lab 04/16/13 0010  NA 138  K 3.7  CL 100  CO2 28  GLUCOSE 118*  BUN 7  CREATININE 0.63  CALCIUM 9.4   Liver Function Tests:  Recent Labs Lab 04/16/13 0010  AST 17  ALT 10  ALKPHOS 106  BILITOT 0.9  PROT 7.7  ALBUMIN 3.7  No results found for this basename: LIPASE, AMYLASE,  in the last 168 hours No results found for this basename: AMMONIA,  in the last 168 hours CBC:  Recent Labs Lab 04/16/13 0010 04/17/13 0435  WBC 13.5* 8.6  NEUTROABS 9.8*  --   HGB 14.9 12.0  HCT 46.5* 39.3  MCV 83.3 85.1  PLT 289 220   Cardiac Enzymes: No results found for this basename: CKTOTAL, CKMB, CKMBINDEX, TROPONINI,  in the last 168 hours BNP: No components found with this basename: POCBNP,  CBG: No results found for this basename: GLUCAP,  in the last 168 hours  Recent Results (from the  past 240 hour(s))  URINE CULTURE     Status: None   Collection Time    04/15/13 11:31 PM      Result Value Range Status   Specimen Description URINE, CLEAN CATCH   Final   Special Requests NONE   Final   Culture  Setup Time 04/16/2013 04:35   Final   Colony Count >=100,000 COLONIES/ML   Final   Culture ENTEROCOCCUS SPECIES   Final   Report Status 04/17/2013 FINAL   Final   Organism ID, Bacteria ENTEROCOCCUS SPECIES   Final     Studies: No results found.  Scheduled Meds: . amoxicillin-clavulanate  1 tablet Oral Q12H  . docusate sodium  100 mg Oral BID  . ipratropium  0.5 mg Nebulization TID  . lidocaine  1-3 patch Transdermal Q24H  . metroNIDAZOLE  500 mg Oral Q8H  . morphine  30 mg Oral Q12H  . pantoprazole  40 mg Oral Daily  . polyethylene glycol  17 g Oral TID PC & HS  . simvastatin  10 mg Oral q1800  . Warfarin - Pharmacist Dosing Inpatient   Does not apply q1800   Continuous Infusions:

## 2013-04-18 NOTE — Progress Notes (Signed)
ANTICOAGULATION CONSULT NOTE - Follow Up  Pharmacy Consult for Warfarin Indication: Hx PE x 3  Allergies  Allergen Reactions  . Shellfish Allergy Hives and Nausea And Vomiting  . Tape     Plastic tape irritates skin   Patient Measurements: Height: 5\' 3"  (160 cm) Weight: 221 lb 14.4 oz (100.653 kg) IBW/kg (Calculated) : 52.4  Labs:  Recent Labs  04/16/13 0010 04/17/13 0435 04/18/13 0525  HGB 14.9 12.0  --   HCT 46.5* 39.3  --   PLT 289 220  --   LABPROT 23.0* 24.1* 28.4*  INR 2.14* 2.28* 2.78*  CREATININE 0.63  --   --     Estimated Creatinine Clearance: 87.8 ml/min (by C-G formula based on Cr of 0.63).  Medications:  Scheduled:  . amoxicillin-clavulanate  1 tablet Oral Q12H  . docusate sodium  100 mg Oral BID  . ipratropium  0.5 mg Nebulization TID  . lidocaine  1-3 patch Transdermal Q24H  . metroNIDAZOLE  500 mg Oral Q8H  . morphine  30 mg Oral Q12H  . pantoprazole  40 mg Oral Daily  . polyethylene glycol  17 g Oral TID PC & HS  . simvastatin  10 mg Oral q1800  . Warfarin - Pharmacist Dosing Inpatient   Does not apply q1800   Infusions:     Assessment: 57 yo admitted 6/24am with diffuse abdominal pain. On chronic coumadin for hx of PE x3. Home warfarin dose reported as 5mg  MWF and 2.5mg  TuThSS - last dose 6/22 (missed 6/23 dose). INR was therapeutic on admission.  INR therapeutic but increased significantly from yesterday (2.78 < 2.28) which may be a result of the drug interaction with Cipro (d/c'd today).  Will provide a decreased warfarin dose today.  No bleeding noted in chart notes.   Goal of Therapy:  INR 2-3  Plan:   Warfarin 2.5mg  x1 at 18:00  Daily PT/INR  Clance Boll, PharmD, BCPS Pager: [phone] 04/18/2013 12:05 PM

## 2013-04-19 DIAGNOSIS — J45909 Unspecified asthma, uncomplicated: Secondary | ICD-10-CM

## 2013-04-19 MED ORDER — LORAZEPAM 2 MG PO TABS: 2.0000 mg | ORAL_TABLET | Freq: Two times a day (BID) | ORAL | Status: DC | PRN

## 2013-04-19 MED ORDER — AMOXICILLIN-POT CLAVULANATE 875-125 MG PO TABS: 1.0000 | ORAL_TABLET | Freq: Two times a day (BID) | ORAL | Status: DC

## 2013-04-19 MED ORDER — FLUCONAZOLE 100 MG PO TABS: 200.0000 mg | ORAL_TABLET | Freq: Every day | ORAL | Status: DC

## 2013-04-19 MED ORDER — BISACODYL 10 MG RE SUPP
10.0000 mg | Freq: Once | RECTAL | Status: AC
  Administered 2013-04-19: 10 mg via RECTAL
  Filled 2013-04-19: qty 1

## 2013-04-19 MED ORDER — OXYMORPHONE HCL ER 30 MG PO TB12: 30.0000 mg | ORAL_TABLET | Freq: Two times a day (BID) | ORAL | Status: DC

## 2013-04-19 MED ORDER — DSS 100 MG PO CAPS: 100.0000 mg | ORAL_CAPSULE | Freq: Two times a day (BID) | ORAL | Status: DC

## 2013-04-19 MED ORDER — FLUCONAZOLE IN SODIUM CHLORIDE 200-0.9 MG/100ML-% IV SOLN
200.0000 mg | INTRAVENOUS | Status: DC
  Administered 2013-04-19: 200 mg via INTRAVENOUS
  Filled 2013-04-19: qty 100

## 2013-04-19 MED ORDER — PROMETHAZINE HCL 25 MG PO TABS: 25.0000 mg | ORAL_TABLET | Freq: Four times a day (QID) | ORAL | Status: DC | PRN

## 2013-04-19 MED ORDER — WARFARIN SODIUM 2.5 MG PO TABS: 2.5000 mg | ORAL_TABLET | Freq: Every day | ORAL | Status: DC

## 2013-04-19 MED ORDER — POLYETHYLENE GLYCOL 3350 17 G PO PACK: 17.0000 g | PACK | Freq: Three times a day (TID) | ORAL | Status: DC

## 2013-04-19 MED ORDER — OXYCODONE-ACETAMINOPHEN 10-325 MG PO TABS: 1.0000 | ORAL_TABLET | ORAL | Status: DC | PRN

## 2013-04-19 NOTE — Discharge Summary (Signed)
Physician Discharge Summary  [name] YNW:[id number] DOB: [birth year] DOA: 04/15/2013  PCP: Thayer Headings, MD  Admit date: 04/15/2013 Discharge date: 04/19/2013  Recommendations for Outpatient Follow-up:  1. Please note that your INR is 3.11 today 04/19/2013. Skip coumadin tonight, recheck your INR 04/20/2013 in the morning and if it is in 2-3 range take 2.5 mg of coumadin 04/20/2013. If it is still above 3 then skip coumadin 04/20/2013 as well. You have informed us that you have machine POCT for INR check but I would strongly advise you to see your PCP in 3 days if your INR continues to be above 3 2. Take Augmentin for 2 weeks for sigmoid diverticulitis 3. Take Fluconazole for 5 days for oral thrush  Discharge Diagnoses:  Principal Problem:   Diverticulitis of sigmoid colon Active Problems:   Asthma   GERD (gastroesophageal reflux disease)   Hyperlipidemia   Chronic narcotic dependence   Chronic pulmonary embolism   UTI (urinary tract infection)   Discharge Condition: medically stable for discharge   Diet recommendation: as tolerated  History of present illness:  57 -year-old female with past medical history of morbid obesity, sigmoid diverticulosis, constipation and chronic narcotic dependence, history of pulmonary embolism on Coumadin who presented to [facility] ED 04/15/2013 with diffuse abdominal pain for past few days prior to this admission. Patient did not have associated fever or chills. She did report nausea and vomiting.  In ED, vital signs were stable. White blood cell count was elevated at 13.5 and BMP was essentially unremarkable. INR was within normal, therapeutic level.   Assessment/Plan:   Principal Problem:  Diverticulitis of sigmoid colon  - For better coverage will use Augmentin will cover for enterococcus UTI as well. We will continue Augmentin 875 mg for 2 weeks on discharge - Patient tolerates regular diet today.  Active Problems:  Personal history of PE  (pulmonary embolism)  - Please note that the patient is dosing her Coumadin. She has PCOT INR machine at home but I have advised her to see PCP if INR continues to be above 3. INR today 3.11 and she will skip the coumadin dose today. Asthma  - Stable  UTI (urinary tract infection)  - secondary to enterococcal UTI  - Continue Augmentin as indicated above for 2 weeks. Obesity  - discussed diet with the patient  Constipation  - Had one bowel movement in past 24 hours.  - Use Colace 100 mg twice daily and MiraLAX 4 times a day   Code Status: full code  Family Communication: no family at the bedside   Manson Passey, MD  [facility]  Pager [phone]   Consultants:  None  Procedures:  None  Antibiotics:  Flagyl 04/18/2013 --> 04/19/2013 Augmentin 04/18/2013 --> continue for 2 weeks on discahrge Cipro 04/15/2013 --> 04/18/2013  Clindamycin 04/15/2013 --> 04/18/2013    Discharge Exam: Filed Vitals:   04/19/13 0448  BP: 102/62  Pulse: 88  Temp: 98.2 F (36.8 C)  Resp: 18   Filed Vitals:   04/18/13 1629 04/18/13 2024 04/18/13 2252 04/19/13 0448  BP: 102/71  129/76 102/62  Pulse: 90 92 89 88  Temp: 98.9 F (37.2 C)  98.5 F (36.9 C) 98.2 F (36.8 C)  TempSrc: Oral  Oral Oral  Resp: 18 18 18 18   Height:      Weight:      SpO2: 90% 92% 95% 91%    General: Pt is alert, follows commands appropriately, not in acute distress Cardiovascular: Regular rate and rhythm,  S1/S2 +, no murmurs, no rubs, no gallops Respiratory: Clear to auscultation bilaterally, no wheezing, no crackles, no rhonchi Abdominal: Soft, non tender, non distended, bowel sounds +, no guarding Extremities: no edema, no cyanosis, pulses palpable bilaterally DP and PT Neuro: Grossly nonfocal  Discharge Instructions  Discharge Orders   Future Orders Complete By Expires     Call MD for:  difficulty breathing, headache or visual disturbances  As directed     Call MD for:  persistant dizziness or light-headedness  As  directed     Call MD for:  persistant nausea and vomiting  As directed     Call MD for:  severe uncontrolled pain  As directed     Diet - low sodium heart healthy  As directed     Discharge instructions  As directed     Comments:      1. Please note that your INR is 3.11 today 04/19/2013. Skip coumadin tonight, recheck your INR 04/20/2013 in the morning and if it is in 2-3 range take 2.5 mg of coumadin 04/20/2013. If it is still above 3 then skip coumadin 04/20/2013 as well. You have informed us that you have machine POCT for INR check but I would strongly advise you to see your PCP in 3 days if your INR continues to be above 3.  2. Take Augmentin for 2 weeks for sigmoid diverticulitis 3. Take fluconazole for 5 days for oral thrush    Increase activity slowly  As directed         Medication List    TAKE these medications       albuterol 108 (90 BASE) MCG/ACT inhaler  Commonly known as:  PROVENTIL HFA;VENTOLIN HFA  Inhale 2 puffs into the lungs every 6 (six) hours as needed. For wheezing     amoxicillin-clavulanate 875-125 MG per tablet  Commonly known as:  AUGMENTIN  Take 1 tablet by mouth every 12 (twelve) hours.     cyclobenzaprine 10 MG tablet  Commonly known as:  FLEXERIL  Take 10 mg by mouth 3 (three) times daily as needed. For muscle spasms     diclofenac sodium 1 % Gel  Commonly known as:  VOLTAREN  Apply 2 g topically 4 (four) times daily as needed. For pain (shoulder,knees, back, neck)     DSS 100 MG Caps  Take 100 mg by mouth 2 (two) times daily.     fluconazole 100 MG tablet  Commonly known as:  DIFLUCAN  Take 2 tablets (200 mg total) by mouth daily.     furosemide 40 MG tablet  Commonly known as:  LASIX  Take 40 mg by mouth 2 (two) times daily as needed. For fluid retention.     ipratropium 0.02 % nebulizer solution  Commonly known as:  ATROVENT  Take 2.5 mLs (0.5 mg total) by nebulization 3 (three) times daily.      isometheptene-acetaminophen-dichloralphenazone 65-325-100 MG capsule  Commonly known as:  MIDRIN  Take 1-2 capsules by mouth 4 (four) times daily as needed. For headaches. Max dose is 5 tabs in 24 hrs. Take 2 tabs at the onset of headache then take 1 tab each hour if needed.     lidocaine 5 %  Commonly known as:  LIDODERM  Place 1-3 patches onto the skin daily. Remove & Discard patch within 12 hours or as directed by MD     LORazepam 2 MG tablet  Commonly known as:  ATIVAN  Take 1 tablet (2 mg total) by  mouth 2 (two) times daily as needed for anxiety.     omeprazole 40 MG capsule  Commonly known as:  PRILOSEC  Take 40 mg by mouth 2 (two) times daily.     oxyCODONE-acetaminophen 10-325 MG per tablet  Commonly known as:  PERCOCET  Take 1-2 tablets by mouth every 4 (four) hours as needed for pain (For breakthrough pain). For pain     oxymorphone 30 MG 12 hr tablet  Commonly known as:  OPANA ER  Take 1 tablet (30 mg total) by mouth every 12 (twelve) hours.     polyethylene glycol packet  Commonly known as:  MIRALAX / GLYCOLAX  Take 17 g by mouth 4 (four) times daily - after meals and at bedtime.     pravastatin 20 MG tablet  Commonly known as:  PRAVACHOL  Take 20 mg by mouth daily.     promethazine 25 MG tablet  Commonly known as:  PHENERGAN  Take 1 tablet (25 mg total) by mouth every 6 (six) hours as needed for nausea.     warfarin 2.5 MG tablet  Commonly known as:  COUMADIN  Take 1 tablet (2.5 mg total) by mouth daily. Take 1 tablet Monday, Wednesday, and Friday. Take 1/2 tablet every other day.           Follow-up Information   Schedule an appointment as soon as possible for a visit with Thayer Headings, MD. (check INR)    Contact information:   [address] [place]resa [place] [address] [phone]        The results of significant diagnostics from this hospitalization (including imaging, microbiology, ancillary and laboratory) are listed below for  reference.    Significant Diagnostic Studies: Ct Abdomen Pelvis W Contrast  04/16/2013   *RADIOLOGY REPORT*  Clinical Data: Abdominal pain.  History diverticulitis.  CT ABDOMEN AND PELVIS WITH CONTRAST  Technique:  Multidetector CT imaging of the abdomen and pelvis was performed following the standard protocol during bolus administration of intravenous contrast.  Contrast: OMNIPAQUE IOHEXOL 300 MG/ML  SOLN  Comparison: 02/26/1947  Findings: Lung bases are clear.  No effusions.  Heart is normal size.  Prior cholecystectomy.  Low density area adjacent to the falciform ligament is stable since prior study, likely focal fatty infiltration.  Tiny low density areas within the spleen are stable, likely benign.  Pancreas, adrenals, kidneys are unremarkable.  Inflammatory changes noted around the sigmoid colon compatible with active diverticulitis.  Diffuse wall thickening throughout the sigmoid colon likely related to the diverticulosis and muscular hypertrophy as well as the inflammatory change related to diverticulitis.  Small amount of free fluid in the pelvis.  Prior hysterectomy.  No adnexal masses.  Urinary bladder is unremarkable.  Small bowel is decompressed.  IMPRESSION: Extensive sigmoid diverticulosis with associated muscular hypertrophy and wall thickening.  There is also inflammatory change around the sigmoid colon compatible with active diverticulitis.   Original Report Authenticated By: Charlett Nose, M.D.    Microbiology: Recent Results (from the past 240 hour(s))  URINE CULTURE     Status: None   Collection Time    04/15/13 11:31 PM      Result Value Range Status   Specimen Description URINE, CLEAN CATCH   Final   Special Requests NONE   Final   Culture  Setup Time 04/16/2013 04:35   Final   Colony Count >=100,000 COLONIES/ML   Final   Culture ENTEROCOCCUS SPECIES   Final   Report Status 04/17/2013 FINAL  Final   Organism ID, Bacteria ENTEROCOCCUS SPECIES   Final     Labs: Basic  Metabolic Panel:  Recent Labs Lab 04/16/13 0010  NA 138  K 3.7  CL 100  CO2 28  GLUCOSE 118*  BUN 7  CREATININE 0.63  CALCIUM 9.4   Liver Function Tests:  Recent Labs Lab 04/16/13 0010  AST 17  ALT 10  ALKPHOS 106  BILITOT 0.9  PROT 7.7  ALBUMIN 3.7   No results found for this basename: LIPASE, AMYLASE,  in the last 168 hours No results found for this basename: AMMONIA,  in the last 168 hours CBC:  Recent Labs Lab 04/16/13 0010 04/17/13 0435  WBC 13.5* 8.6  NEUTROABS 9.8*  --   HGB 14.9 12.0  HCT 46.5* 39.3  MCV 83.3 85.1  PLT 289 220   Cardiac Enzymes: No results found for this basename: CKTOTAL, CKMB, CKMBINDEX, TROPONINI,  in the last 168 hours BNP: BNP (last 3 results) No results found for this basename: PROBNP,  in the last 8760 hours CBG: No results found for this basename: GLUCAP,  in the last 168 hours  Time coordinating discharge: Over 30 minutes  Signed:  Manson Passey, MD  [facility] 04/19/2013, 10:57 AM  Pager #: [phone]

## 2013-04-19 NOTE — Care Management Note (Signed)
    Page 1 of 2   04/19/2013     1:57:22 PM   CARE MANAGEMENT NOTE 04/19/2013  Patient:  [name], [name]   Account Number:  [id number]  Date Initiated:  04/16/2013  Documentation initiated by:  DAVIS,TYMEEKA  Subjective/Objective Assessment:   57 yo female admitted with diverticulitis. PTA pt independent. PCP PCP:   Thayer Headings, MD follows coumadin.     Action/Plan:   Home when stable   Anticipated DC Date:  04/18/2013   Anticipated DC Plan:  HOME/SELF CARE      DC Planning Services  CM consult      Choice offered to / List presented to:  NA   DME arranged  NA      DME agency  NA     HH arranged  NA      HH agency  NA   Status of service:  Completed, signed off Medicare Important Message given?   (If response is "NO", the following Medicare IM given date fields will be blank) Date Medicare IM given:   Date Additional Medicare IM given:    Discharge Disposition:  HOME/SELF CARE  Per UR Regulation:  Reviewed for med. necessity/level of care/duration of stay  If discussed at Long Length of Stay Meetings, dates discussed:    Comments:  04/19/13 Jane Broughton RN,BSN NCM [phone] D/C HOME NO ORDERS OR NEEDS.  04/16/13 Tymeeka Davis,RN,BSN [phone] No needs identified at this time.

## 2013-04-19 NOTE — Progress Notes (Signed)
ANTICOAGULATION CONSULT NOTE - Follow Up  Pharmacy Consult for Warfarin Indication: Hx PE x 3  Allergies  Allergen Reactions  . Shellfish Allergy Hives and Nausea And Vomiting  . Tape     Plastic tape irritates skin   Patient Measurements: Height: 5\' 3"  (160 cm) Weight: 221 lb 14.4 oz (100.653 kg) IBW/kg (Calculated) : 52.4  Labs:  Recent Labs  04/17/13 0435 04/18/13 0525 04/19/13 0520  HGB 12.0  --   --   HCT 39.3  --   --   PLT 220  --   --   LABPROT 24.1* 28.4* 30.9*  INR 2.28* 2.78* 3.11*    Estimated Creatinine Clearance: 87.8 ml/min (by C-G formula based on Cr of 0.63).  Medications:  Scheduled:  . amoxicillin-clavulanate  1 tablet Oral Q12H  . docusate sodium  100 mg Oral BID  . fluconazole (DIFLUCAN) IV  200 mg Intravenous Q24H  . ipratropium  0.5 mg Nebulization TID  . lidocaine  1-3 patch Transdermal Q24H  . metroNIDAZOLE  500 mg Oral Q8H  . morphine  30 mg Oral Q12H  . pantoprazole  40 mg Oral Daily  . polyethylene glycol  17 g Oral TID PC & HS  . simvastatin  10 mg Oral q1800  . Warfarin - Pharmacist Dosing Inpatient   Does not apply q1800   Infusions:     Assessment: 57 yo admitted 6/24am with diffuse abdominal pain. On chronic coumadin for hx of PE x3. Home warfarin dose reported as 5mg  MWF and 2.5mg  TuThSS - last dose 6/22 (missed 6/23 dose). INR was therapeutic on admission.  INR 3.11, supratherapeutic which may be a result of the drug interaction with Cipro (d/c'd yesterday).  Provided a lower dose of warfarin last night and will hold a dose today to allow INR to trend down.  No bleeding noted in chart notes.   Goal of Therapy:  INR 2-3  Plan:   No warfarin dose today.  Daily PT/INR  Clance Boll, PharmD, BCPS Pager: [phone] 04/19/2013 10:00 AM

## 2013-04-19 NOTE — Progress Notes (Signed)
Pt given tap water enema per MD orders. Pt given about 750cc of warm tap water, however she was not able to retain much of the enema. Pt did not complain of any discomfort with the enema administration. Pt got up to the [facility] after the enema and was able to pass three hard, medium sized, round balls of stool. Stool was brown in color. Pt got back to bed and suppository was given per MD order. Pt awaiting further results. Will continue to monitor pt's BMs.   Arta Bruce [facility]

## 2014-01-16 ENCOUNTER — Other Ambulatory Visit: Payer: Self-pay

## 2014-01-16 DIAGNOSIS — Z1231 Encounter for screening mammogram for malignant neoplasm of breast: Secondary | ICD-10-CM

## 2014-02-06 ENCOUNTER — Ambulatory Visit
Admission: RE | Admit: 2014-02-06 | Discharge: 2014-02-06 | Disposition: A | Discharge status: Home / Self Care | Payer: Medicare Other | Source: Ambulatory Visit

## 2014-02-06 DIAGNOSIS — Z1231 Encounter for screening mammogram for malignant neoplasm of breast: Secondary | ICD-10-CM

## 2014-11-19 DIAGNOSIS — M961 Postlaminectomy syndrome, not elsewhere classified: Secondary | ICD-10-CM | POA: Diagnosis not present

## 2014-11-19 DIAGNOSIS — M25519 Pain in unspecified shoulder: Secondary | ICD-10-CM | POA: Diagnosis not present

## 2014-11-19 DIAGNOSIS — Z79899 Other long term (current) drug therapy: Secondary | ICD-10-CM | POA: Diagnosis not present

## 2014-11-19 DIAGNOSIS — G894 Chronic pain syndrome: Secondary | ICD-10-CM | POA: Diagnosis not present

## 2014-11-19 DIAGNOSIS — M545 Low back pain: Secondary | ICD-10-CM | POA: Diagnosis not present

## 2014-12-17 DIAGNOSIS — M179 Osteoarthritis of knee, unspecified: Secondary | ICD-10-CM | POA: Diagnosis not present

## 2014-12-17 DIAGNOSIS — M25569 Pain in unspecified knee: Secondary | ICD-10-CM | POA: Diagnosis not present

## 2014-12-29 DIAGNOSIS — M961 Postlaminectomy syndrome, not elsewhere classified: Secondary | ICD-10-CM | POA: Diagnosis not present

## 2014-12-29 DIAGNOSIS — G894 Chronic pain syndrome: Secondary | ICD-10-CM | POA: Diagnosis not present

## 2014-12-29 DIAGNOSIS — G629 Polyneuropathy, unspecified: Secondary | ICD-10-CM | POA: Diagnosis not present

## 2014-12-29 DIAGNOSIS — M1990 Unspecified osteoarthritis, unspecified site: Secondary | ICD-10-CM | POA: Diagnosis not present

## 2014-12-29 DIAGNOSIS — Z79899 Other long term (current) drug therapy: Secondary | ICD-10-CM | POA: Diagnosis not present

## 2015-01-12 DIAGNOSIS — Z79899 Other long term (current) drug therapy: Secondary | ICD-10-CM | POA: Diagnosis not present

## 2015-01-12 DIAGNOSIS — M545 Low back pain: Secondary | ICD-10-CM | POA: Diagnosis not present

## 2015-01-12 DIAGNOSIS — G894 Chronic pain syndrome: Secondary | ICD-10-CM | POA: Diagnosis not present

## 2015-01-12 DIAGNOSIS — M961 Postlaminectomy syndrome, not elsewhere classified: Secondary | ICD-10-CM | POA: Diagnosis not present

## 2015-02-02 DIAGNOSIS — G894 Chronic pain syndrome: Secondary | ICD-10-CM | POA: Diagnosis not present

## 2015-02-02 DIAGNOSIS — M545 Low back pain: Secondary | ICD-10-CM | POA: Diagnosis not present

## 2015-02-02 DIAGNOSIS — M961 Postlaminectomy syndrome, not elsewhere classified: Secondary | ICD-10-CM | POA: Diagnosis not present

## 2015-02-02 DIAGNOSIS — Z79899 Other long term (current) drug therapy: Secondary | ICD-10-CM | POA: Diagnosis not present

## 2015-02-02 DIAGNOSIS — G629 Polyneuropathy, unspecified: Secondary | ICD-10-CM | POA: Diagnosis not present

## 2015-02-23 DIAGNOSIS — F1121 Opioid dependence, in remission: Secondary | ICD-10-CM | POA: Diagnosis not present

## 2015-02-23 DIAGNOSIS — Z79899 Other long term (current) drug therapy: Secondary | ICD-10-CM | POA: Diagnosis not present

## 2015-02-23 DIAGNOSIS — M961 Postlaminectomy syndrome, not elsewhere classified: Secondary | ICD-10-CM | POA: Diagnosis not present

## 2015-02-23 DIAGNOSIS — G894 Chronic pain syndrome: Secondary | ICD-10-CM | POA: Diagnosis not present

## 2015-02-23 DIAGNOSIS — Z0000 Encounter for general adult medical examination without abnormal findings: Secondary | ICD-10-CM | POA: Diagnosis not present

## 2015-02-23 DIAGNOSIS — M8580 Other specified disorders of bone density and structure, unspecified site: Secondary | ICD-10-CM | POA: Diagnosis not present

## 2015-02-23 DIAGNOSIS — Z1389 Encounter for screening for other disorder: Secondary | ICD-10-CM | POA: Diagnosis not present

## 2015-02-25 DIAGNOSIS — M859 Disorder of bone density and structure, unspecified: Secondary | ICD-10-CM | POA: Diagnosis not present

## 2015-02-25 DIAGNOSIS — I10 Essential (primary) hypertension: Secondary | ICD-10-CM | POA: Diagnosis not present

## 2015-04-01 DIAGNOSIS — R5383 Other fatigue: Secondary | ICD-10-CM | POA: Diagnosis not present

## 2015-04-01 DIAGNOSIS — E780 Pure hypercholesterolemia: Secondary | ICD-10-CM | POA: Diagnosis not present

## 2015-04-01 DIAGNOSIS — R42 Dizziness and giddiness: Secondary | ICD-10-CM | POA: Diagnosis not present

## 2015-04-01 DIAGNOSIS — R079 Chest pain, unspecified: Secondary | ICD-10-CM | POA: Diagnosis not present

## 2015-04-09 DIAGNOSIS — R9431 Abnormal electrocardiogram [ECG] [EKG]: Secondary | ICD-10-CM | POA: Diagnosis not present

## 2015-04-09 DIAGNOSIS — R0602 Shortness of breath: Secondary | ICD-10-CM | POA: Diagnosis not present

## 2015-04-09 DIAGNOSIS — R0789 Other chest pain: Secondary | ICD-10-CM | POA: Diagnosis not present

## 2015-04-09 DIAGNOSIS — E780 Pure hypercholesterolemia: Secondary | ICD-10-CM | POA: Diagnosis not present

## 2015-04-15 DIAGNOSIS — R0789 Other chest pain: Secondary | ICD-10-CM | POA: Diagnosis not present

## 2015-04-15 DIAGNOSIS — R0602 Shortness of breath: Secondary | ICD-10-CM | POA: Diagnosis not present

## 2015-04-15 DIAGNOSIS — R9431 Abnormal electrocardiogram [ECG] [EKG]: Secondary | ICD-10-CM | POA: Diagnosis not present

## 2015-05-05 DIAGNOSIS — R0789 Other chest pain: Secondary | ICD-10-CM | POA: Diagnosis not present

## 2015-05-05 DIAGNOSIS — R9431 Abnormal electrocardiogram [ECG] [EKG]: Secondary | ICD-10-CM | POA: Diagnosis not present

## 2015-05-05 DIAGNOSIS — R0602 Shortness of breath: Secondary | ICD-10-CM | POA: Diagnosis not present

## 2015-05-14 DIAGNOSIS — E780 Pure hypercholesterolemia: Secondary | ICD-10-CM | POA: Diagnosis not present

## 2015-05-14 DIAGNOSIS — R0602 Shortness of breath: Secondary | ICD-10-CM | POA: Diagnosis not present

## 2015-05-14 DIAGNOSIS — R0789 Other chest pain: Secondary | ICD-10-CM | POA: Diagnosis not present

## 2015-05-14 DIAGNOSIS — R9431 Abnormal electrocardiogram [ECG] [EKG]: Secondary | ICD-10-CM | POA: Diagnosis not present

## 2015-06-10 ENCOUNTER — Telehealth: Payer: Self-pay | Admitting: *Deleted

## 2015-06-10 ENCOUNTER — Institutional Professional Consult (permissible substitution): Payer: Medicare Other | Admitting: Neurology

## 2015-06-10 NOTE — Telephone Encounter (Signed)
No showed new patient appointment with Dr. Brett Fairy.

## 2015-06-11 ENCOUNTER — Encounter: Payer: Self-pay | Admitting: Neurology

## 2015-08-11 DIAGNOSIS — Z23 Encounter for immunization: Secondary | ICD-10-CM | POA: Diagnosis not present

## 2015-08-11 DIAGNOSIS — E7800 Pure hypercholesterolemia, unspecified: Secondary | ICD-10-CM | POA: Diagnosis not present

## 2015-08-11 DIAGNOSIS — Z0000 Encounter for general adult medical examination without abnormal findings: Secondary | ICD-10-CM | POA: Diagnosis not present

## 2015-08-11 DIAGNOSIS — I10 Essential (primary) hypertension: Secondary | ICD-10-CM | POA: Diagnosis not present

## 2015-08-11 DIAGNOSIS — E789 Disorder of lipoprotein metabolism, unspecified: Secondary | ICD-10-CM | POA: Diagnosis not present

## 2015-08-11 DIAGNOSIS — E559 Vitamin D deficiency, unspecified: Secondary | ICD-10-CM | POA: Diagnosis not present

## 2015-08-14 DIAGNOSIS — R0602 Shortness of breath: Secondary | ICD-10-CM | POA: Diagnosis not present

## 2015-08-14 DIAGNOSIS — Z6841 Body Mass Index (BMI) 40.0 and over, adult: Secondary | ICD-10-CM | POA: Diagnosis not present

## 2015-08-26 ENCOUNTER — Telehealth: Payer: Self-pay

## 2015-08-26 DIAGNOSIS — M961 Postlaminectomy syndrome, not elsewhere classified: Secondary | ICD-10-CM | POA: Diagnosis not present

## 2015-08-26 DIAGNOSIS — G894 Chronic pain syndrome: Secondary | ICD-10-CM | POA: Diagnosis not present

## 2015-08-26 DIAGNOSIS — M545 Low back pain: Secondary | ICD-10-CM | POA: Diagnosis not present

## 2015-08-26 DIAGNOSIS — R2689 Other abnormalities of gait and mobility: Secondary | ICD-10-CM | POA: Diagnosis not present

## 2015-08-26 DIAGNOSIS — M5417 Radiculopathy, lumbosacral region: Secondary | ICD-10-CM | POA: Diagnosis not present

## 2015-08-26 NOTE — Telephone Encounter (Signed)
Called Dr. Doristine Devoid office and Dr. Irven Shelling office to find a copy of the sleep study performed in 2011. They cannot locate any record of it. I also called pt to try and find out where her sleep study was done, but no answer and unable to leave a voicemail.

## 2015-08-27 ENCOUNTER — Encounter: Payer: Self-pay | Admitting: Neurology

## 2015-08-27 ENCOUNTER — Ambulatory Visit (INDEPENDENT_AMBULATORY_CARE_PROVIDER_SITE_OTHER): Payer: Medicare Other | Admitting: Neurology

## 2015-08-27 VITALS — BP 110/80 | HR 82 | Resp 20 | Ht 63.0 in | Wt 234.0 lb

## 2015-08-27 DIAGNOSIS — R0683 Snoring: Secondary | ICD-10-CM | POA: Diagnosis not present

## 2015-08-27 DIAGNOSIS — G8929 Other chronic pain: Secondary | ICD-10-CM

## 2015-08-27 DIAGNOSIS — G4701 Insomnia due to medical condition: Secondary | ICD-10-CM | POA: Diagnosis not present

## 2015-08-27 DIAGNOSIS — G4730 Sleep apnea, unspecified: Secondary | ICD-10-CM | POA: Diagnosis not present

## 2015-08-27 NOTE — Patient Instructions (Signed)
Insomnia Insomnia is a sleep disorder that makes it difficult to fall asleep or to stay asleep. Insomnia can cause tiredness (fatigue), low energy, difficulty concentrating, mood swings, and poor performance at work or school.  There are three different ways to classify insomnia:  Difficulty falling asleep.  Difficulty staying asleep.  Waking up too early in the morning. Any type of insomnia can be long-term (chronic) or short-term (acute). Both are common. Short-term insomnia usually lasts for three months or less. Chronic insomnia occurs at least three times a week for longer than three months. CAUSES  Insomnia may be caused by another condition, situation, or substance, such as:  Anxiety.  Certain medicines.  Gastroesophageal reflux disease (GERD) or other gastrointestinal conditions.  Asthma or other breathing conditions.  Restless legs syndrome, sleep apnea, or other sleep disorders.  Chronic pain.  Menopause. This may include hot flashes.  Stroke.  Abuse of alcohol, tobacco, or illegal drugs.  Depression.  Caffeine.   Neurological disorders, such as Alzheimer disease.  An overactive thyroid (hyperthyroidism). The cause of insomnia may not be known. RISK FACTORS Risk factors for insomnia include:  Gender. Women are more commonly affected than men.  Age. Insomnia is more common as you get older.  Stress. This may involve your professional or personal life.  Income. Insomnia is more common in people with lower income.  Lack of exercise.   Irregular work schedule or night shifts.  Traveling between different time zones. SIGNS AND SYMPTOMS If you have insomnia, trouble falling asleep or trouble staying asleep is the main symptom. This may lead to other symptoms, such as:  Feeling fatigued.  Feeling nervous about going to sleep.  Not feeling rested in the morning.  Having trouble concentrating.  Feeling irritable, anxious, or depressed. TREATMENT   Treatment for insomnia depends on the cause. If your insomnia is caused by an underlying condition, treatment will focus on addressing the condition. Treatment may also include:   Medicines to help you sleep.  Counseling or therapy.  Lifestyle adjustments. HOME CARE INSTRUCTIONS   Take medicines only as directed by your health care provider.  Keep regular sleeping and waking hours. Avoid naps.  Keep a sleep diary to help you and your health care provider figure out what could be causing your insomnia. Include:   When you sleep.  When you wake up during the night.  How well you sleep.   How rested you feel the next day.  Any side effects of medicines you are taking.  What you eat and drink.   Make your bedroom a comfortable place where it is easy to fall asleep:  Put up shades or special blackout curtains to block light from outside.  Use a white noise machine to block noise.  Keep the temperature cool.   Exercise regularly as directed by your health care provider. Avoid exercising right before bedtime.  Use relaxation techniques to manage stress. Ask your health care provider to suggest some techniques that may work well for you. These may include:  Breathing exercises.  Routines to release muscle tension.  Visualizing peaceful scenes.  Cut back on alcohol, caffeinated beverages, and cigarettes, especially close to bedtime. These can disrupt your sleep.  Do not overeat or eat spicy foods right before bedtime. This can lead to digestive discomfort that can make it hard for you to sleep.  Limit screen use before bedtime. This includes:  Watching TV.  Using your smartphone, tablet, and computer.  Stick to a routine. This   can help you fall asleep faster. Try to do a quiet activity, brush your teeth, and go to bed at the same time each night.  Get out of bed if you are still awake after 15 minutes of trying to sleep. Keep the lights down, but try reading or  doing a quiet activity. When you feel sleepy, go back to bed.  Make sure that you drive carefully. Avoid driving if you feel very sleepy.  Keep all follow-up appointments as directed by your health care provider. This is important. SEEK MEDICAL CARE IF:   You are tired throughout the day or have trouble in your daily routine due to sleepiness.  You continue to have sleep problems or your sleep problems get worse. SEEK IMMEDIATE MEDICAL CARE IF:   You have serious thoughts about hurting yourself or someone else.   This information is not intended to replace advice given to you by your health care provider. Make sure you discuss any questions you have with your health care provider.   Document Released: 10/07/2000 Document Revised: 07/01/2015 Document Reviewed: 07/11/2014 Elsevier Interactive Patient Education 2016 [employer] Inc[employer]  

## 2015-08-27 NOTE — Progress Notes (Signed)
SLEEP MEDICINE CLINIC   Provider:  Larey Seat, M D  Referring Provider: Einar Gip, MD   Primary Care Physician:  Thressa Sheller, MD  Chief Complaint  Patient presents with  . New Patient (Initial Visit)    Dr. Einar Gip referral, snores, discuss sleep study, rm 11, alone   Chief complaint according to patient : " My father has witnessed me having apneas and snoring"   HPI:  [name] is a 59 y.o. female, seen here as a referral from Dr. Einar Gip for a sleep evaluation,  [name] is a Caucasian female with a history of morbid obesity, she is exposed to passive smoke as she lives with father and brother. She has 3 living children she does not drink does not use tobacco in any form, seldom drinks caffeine in it beverages.  She is referred today by Dr. Willaim Bane because she has been reporting to have fatigue and dyspnea she easily gets short of breath she has followed with her cardiologist for chest pain she is on a long list of medications which will be reviewed below. She also has a long surgical history. An EKG from 7-20 7-12 showed normal sinus rhythm with an old inferior infarct, there was also COPD diagnosed at the time, lower extremity Dopplers show triphasic waveforms in 2016 but fibrocalcific changes. She underwent an echocardiogram on 03-29-11 was normal left ventricular ejection fraction mild LVH diastolic dysfunction. Coronary angiography 2006 no evidence of arterial stenosis normal right heart preserved cardiac output.  She suffered from a pulmonary embolus in 2014 and had a previous PE in 1984. She has chronic hypertension ,chronic pain,  acid reflux with GERd and hiatal hernia and suffered a heart attack in 2014.  She also has hypercholesterolemia and migraine headaches.  She had a sleep study in 2011 performed is [facility] and interpreted by Dr. Tonia Ghent,  Dr. Trilby Drummer was the referring physician. The sleep study is here to review and the patient to Dr.  Ellen Henri  surprise did not have significant apnea;  her AHI was 3.6 on 04-24-09.  Dr. Einar Gip however is very concerned since the patient still has a very high body mass index and is easily short of breath. Her chest pain is commonly waking her at night.  Her degree of fatigue and sleepiness also worrisome as well as the report of witnessed snoring and apnea. For this reason he would like the patient to be reevaluated in a sleep study.    Sleep habits are as follows: No established routines, variable sleep hours and length. The patient reports that depending on what her father is doing affects her hours of sleep- he is up all night. She does not have a bedtime she has not established routines to go to the bedroom. Sometimes she will fall asleep in a recliner or on her sofa, she prefers sleeping in a seated position with a chest elevated. This also helps her back pain. 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 Before she would go to bed at 11 PM and usually will fall asleep within 30 minutes. She would have to rise at about 6 AM to get her kids ready for school. Over the last for 5 years however this has deteriorated. Anytime between 10 PM and 2 in the morning. Some nights she will not go to sleep at all until 7 or 8 AM. She wakes frequently up with headaches and a dry mouth. Sometimes she wakes  up from neck pain arising from a disc herniation she reports. This may wake her out of sleep and is not just present when she wakes up for other reasons. She has up to 3 nocturia breaks per night. Her father will go to bed sometimes at 11.30 AM,  sleeps until midnight. His  established circadian rhythm disorder which affects the patient was worried about her father's tendency to fall or to get injured. He has Alzheimer's Dementia. He is oxygen dependent , continues to smoke has COPD and urinary incontinence.    Sleep medical history and family sleep history: her father has circadian rhythm  disorder from dementia.  Review of Systems: Out of a complete 14 system review, the patient complains of only the following symptoms, and all other reviewed systems are negative. Tearful, depressed, obese. In pain management. Trokendi XR is her newest medication.   Epworth score 16 , Fatigue severity score 47   , depression score 17    Social History   Social History  . Marital Status: Single    Spouse Name: N/A  . Number of Children: N/A  . Years of Education: N/A   Occupational History  . Not on file.   Social History Main Topics  . Smoking status: Never Smoker   . Smokeless tobacco: Not on file  . Alcohol Use: No  . Drug Use: No  . Sexual Activity: No   Other Topics Concern  . Not on file   Social History Narrative    Family History  Problem Relation Age of Onset  . Heart failure Mother   . COPD Mother   . Heart failure Father   . COPD Father   . Cancer Other   . Heart failure Other     Past Medical History  Diagnosis Date  . Asthma   . Pulmonary embolism ([place])   . High cholesterol   . Acid reflux   . Pneumonia   . Heart murmur   . Hiatal hernia   . Diverticulitis   . COPD (chronic obstructive pulmonary disease) ([place])   . Chronic pain   . Labral tear of shoulder     Past Surgical History  Procedure Laterality Date  . Replacement total knee    . Cholecystectomy    . Abdominal hysterectomy    . Back surgery    . Joint replacement    . Carpal tunnel release    .  bone spurs removed from feet    . Tubal ligation      Current Outpatient Prescriptions  Medication Sig Dispense Refill  . albuterol (PROVENTIL HFA;VENTOLIN HFA) 108 (90 BASE) MCG/ACT inhaler Inhale 2 puffs into the lungs every 6 (six) hours as needed. For wheezing 2 each 0  . amoxicillin-clavulanate (AUGMENTIN) 875-125 MG per tablet Take 1 tablet by mouth every 12 (twelve) hours. 28 tablet 0  . cyclobenzaprine (FLEXERIL) 10 MG tablet Take 10 mg by mouth 3 (three) times daily as  needed. For muscle spasms    . diazepam (VALIUM) 10 MG tablet Take 10 mg by mouth every 6 (six) hours as needed for anxiety.    . diclofenac sodium (VOLTAREN) 1 % GEL Apply 2 g topically 4 (four) times daily as needed. For pain (shoulder,knees, back, neck)    . ergocalciferol (VITAMIN D2) 50000 UNITS capsule Take 50,000 Units by mouth once a week.    . furosemide (LASIX) 40 MG tablet Take 40 mg by mouth 2 (two) times daily as needed. For fluid retention.    Marland Kitchen  HYDROcodone-acetaminophen (NORCO) 10-325 MG per tablet Take 1 tablet by mouth every 6 (six) hours as needed.    Marland Kitchen ipratropium (ATROVENT) 0.02 % nebulizer solution Take 2.5 mLs (0.5 mg total) by nebulization 3 (three) times daily. 75 mL 0  . isometheptene-acetaminophen-dichloralphenazone (MIDRIN) 65-325-100 MG capsule Take 1-2 capsules by mouth 4 (four) times daily as needed. For headaches. Max dose is 5 tabs in 24 hrs. Take 2 tabs at the onset of headache then take 1 tab each hour if needed.    . lidocaine (LIDODERM) 5 % Place 1-3 patches onto the skin daily. Remove & Discard patch within 12 hours or as directed by MD    . losartan (COZAAR) 50 MG tablet Take 50 mg by mouth daily. Take 1/2 daily    . metoprolol succinate (TOPROL-XL) 50 MG 24 hr tablet 1 (ONE) TABLET BY MOUTH DAILY  3  . METOPROLOL SUCCINATE ER PO Take 25 mg by mouth.    . mometasone (NASONEX) 50 MCG/ACT nasal spray Place 1 spray into the nose daily.    Marland Kitchen omeprazole (PRILOSEC) 40 MG capsule Take 40 mg by mouth 2 (two) times daily.     Marland Kitchen oxyCODONE-acetaminophen (PERCOCET) 10-325 MG per tablet Take 1-2 tablets by mouth every 4 (four) hours as needed for pain (For breakthrough pain). For pain 45 tablet 0  . pravastatin (PRAVACHOL) 40 MG tablet Take 40 mg by mouth daily.    . promethazine (PHENERGAN) 25 MG tablet Take 1 tablet (25 mg total) by mouth every 6 (six) hours as needed for nausea. 30 tablet 0  . warfarin (COUMADIN) 5 MG tablet 1 tablet Mon, Wed, Fri 1/2 tablet all other  days  0   No current facility-administered medications for this visit.    Allergies as of 08/27/2015 - Review Complete 08/27/2015  Allergen Reaction Noted  . Shellfish allergy Hives and Nausea And Vomiting 11/16/2011  . Tape  03/14/2013    Vitals: BP 110/80 mmHg  Pulse 82  Resp 20  Ht 5\' 3"  (1.6 m)  Wt 234 lb (106.142 kg)  BMI 41.46 kg/m2 Last Weight:  Wt Readings from Last 1 Encounters:  08/27/15 234 lb (106.142 kg)   XMI:WOEH mass index is 41.46 kg/(m^2).     Last Height:   Ht Readings from Last 1 Encounters:  08/27/15 5\' 3"  (1.6 m)    Physical exam:  General: The patient is awake, alert and appears not in acute distress. The patient is well groomed. Head: Normocephalic, atraumatic. Neck is supple. Mallampati  4, 16.5  neck circumference:. Nasal airflow  Restricted.   Cardiovascular:  Regular rate and rhythm , without  murmurs or carotid bruit, and without distended neck veins. Respiratory: Lungs are clear to auscultation. Skin:  Without evidence of edema, or rash Trunk: BMI is elevated . The patient's posture is stooped.  Neurologic exam : The patient is awake and alert, oriented to place and time.   Memory subjective  described as intact.  Attention span & concentration ability appears normal.  Speech is fluent,  without dysarthria, dysphonia or aphasia.  Mood and affect are depressed and tearful .  Cranial nerves: Pupils are equal and briskly reactive to light. Funduscopic exam without  evidence of pallor or edema. Extraocular movements  in vertical and horizontal planes intact and without nystagmus. Visual fields by finger perimetry are intact. Hearing to finger rub intact.   Facial sensation intact to fine touch.  Facial motor strength is symmetric and tongue and uvula move midline. Shoulder  shrug was symmetrical.   Motor exam:   Normal tone, muscle bulk and symmetric strength in all extremities. She has pain related limitations to ROM in the left shoulder and  left knee.  Sensory:  Fine touch, pinprick and vibration were tested in all extremities. Proprioception normal.  Coordination: Rapid alternating movements in the fingers/hands was normal. Finger-to-nose maneuver normal without evidence of ataxia, dysmetria or tremor.  Gait and station: Patient walks with a can as  assistive device and is unable to climb up to the exam table. Strength within normal limits.  Stance is stable and normal.   Toe and heel stand were deferred as was tandem gait .Turns with  5  Steps. Romberg testing is negative.  Deep tendon reflexes: in the upper and lower extremities are attenuated  Babinski maneuver response is  downgoing.  The patient was advised of the nature of the diagnosed sleep disorder , the treatment options and risks for general a health and wellness arising from not treating the condition.  I spent more than 45  minutes of face to face time with the patient.  Greater than 50% of time was spent in counseling and coordination of care.  We have discussed the diagnosis and differential and I answered the patient's questions.     Assessment:  After physical and neurologic examination, review of laboratory studies,  Personal review of imaging studies, reports of other /same  Imaging studies ,  Results of polysomnography/ neurophysiology testing and pre-existing records as far as provided in visit., my assessment is   1) [name] has a high risk of obesity hypoventilation, she is a shallow breather, she easily short of breath and she is currently deconditioned. In addition her BMI exceeds 45. I'm more concerned about hypoxemia or hypercapnia than actual apnea. We will order a split night polysomnography with capnography for this patient. Her comorbidities include a history of pulmonary embolism, chronic exposure to passive smoke, and heart disease.  2) the patient has an acquired circadian rhythm disorder , basically a free running cycle. It will depend  entirely on her to establish sleep habits again this means to establish a bedtime as well as a rise time. She has to eliminate background noise light and sounds from her bedroom. She should not watch TV while sleeping. She reports also been a light sleeper and being frequently in discomfort. I do not object to her sleeping in a recliner or in a chair but she needs to adjust her bedtimes and the bedroom. There is also her caretaker issue and she will probably need to alternate with her brother in the care of their father. If this is not possible she may need to consider respite care.  3) [name] will be followed for her possible sleep apnea given organic sleep disorder cannot be found she will return to her primary care physician Dr. Aline August and her cardiologist Dr. Darnell Montoya for further evaluation. My role is to identify organic sleep  disorders and treat those. Insomnia with poor sleep hygiene and in a depressed patient in chronic pain will need to be treated by pain management and psychiatry.  Please recognize that [name] was " cut off all pain meds" by her pain clinic after her PA left.    Plan:  Treatment plan and additional workup : split , capnography,   recommend to address depression and social issues with PCP, respite care. Dementia care for her father.     Asencion Partridge Ayra Hodgdon MD  08/27/2015  CC Adrian Prows, MD cardiology    CC: Thressa Sheller, [address], [place], [place] [id number]

## 2015-08-28 DIAGNOSIS — Z7901 Long term (current) use of anticoagulants: Secondary | ICD-10-CM | POA: Diagnosis not present

## 2015-08-28 DIAGNOSIS — I2699 Other pulmonary embolism without acute cor pulmonale: Secondary | ICD-10-CM | POA: Diagnosis not present

## 2015-09-18 ENCOUNTER — Ambulatory Visit (INDEPENDENT_AMBULATORY_CARE_PROVIDER_SITE_OTHER): Payer: Medicare Other | Admitting: Neurology

## 2015-09-18 DIAGNOSIS — R0683 Snoring: Secondary | ICD-10-CM

## 2015-09-18 DIAGNOSIS — G4733 Obstructive sleep apnea (adult) (pediatric): Secondary | ICD-10-CM

## 2015-09-18 DIAGNOSIS — G8929 Other chronic pain: Principal | ICD-10-CM

## 2015-09-18 DIAGNOSIS — G4701 Insomnia due to medical condition: Secondary | ICD-10-CM

## 2015-09-18 DIAGNOSIS — G4730 Sleep apnea, unspecified: Secondary | ICD-10-CM

## 2015-09-19 NOTE — Sleep Study (Signed)
Please see the scanned sleep study interpretation located in the procedure tab within the chart review section.   

## 2015-09-27 DIAGNOSIS — I2699 Other pulmonary embolism without acute cor pulmonale: Secondary | ICD-10-CM | POA: Diagnosis not present

## 2015-09-27 DIAGNOSIS — Z7901 Long term (current) use of anticoagulants: Secondary | ICD-10-CM | POA: Diagnosis not present

## 2015-09-30 ENCOUNTER — Telehealth: Payer: Self-pay

## 2015-09-30 DIAGNOSIS — G4733 Obstructive sleep apnea (adult) (pediatric): Secondary | ICD-10-CM

## 2015-09-30 NOTE — Telephone Encounter (Signed)
Spoke to pt. I advised her that her sleep study revealed hypoxemia and hypercapnia with mild osa and treatment is advised. Dr. Brett Fairy advised that CPAP therapy is indicated due to her hypercapnia and hypoxemia. I also advised pt that pain management will address all respiratory suppressant medications. Pt states that she is no longer taking any pain medications because she was taken off of all of those medications and therefore does not need to see pain management.  I advised pt that Dr. Brett Fairy recommended coming in for an attended cpap titration study to optimize therapy. Pt is agreeable to coming in for a cpap titration. Pt asked me to fax these results to Dr. Einar Gip and Dr. Noah Delaine.

## 2015-10-27 DIAGNOSIS — Z7901 Long term (current) use of anticoagulants: Secondary | ICD-10-CM | POA: Diagnosis not present

## 2015-10-27 DIAGNOSIS — I2699 Other pulmonary embolism without acute cor pulmonale: Secondary | ICD-10-CM | POA: Diagnosis not present

## 2015-11-19 DIAGNOSIS — Z01419 Encounter for gynecological examination (general) (routine) without abnormal findings: Secondary | ICD-10-CM | POA: Diagnosis not present

## 2015-11-19 DIAGNOSIS — R32 Unspecified urinary incontinence: Secondary | ICD-10-CM | POA: Diagnosis not present

## 2015-11-19 DIAGNOSIS — N951 Menopausal and female climacteric states: Secondary | ICD-10-CM | POA: Diagnosis not present

## 2015-11-19 DIAGNOSIS — N952 Postmenopausal atrophic vaginitis: Secondary | ICD-10-CM | POA: Diagnosis not present

## 2015-11-19 DIAGNOSIS — Z1231 Encounter for screening mammogram for malignant neoplasm of breast: Secondary | ICD-10-CM | POA: Diagnosis not present

## 2015-11-24 ENCOUNTER — Other Ambulatory Visit: Payer: Self-pay | Admitting: Obstetrics and Gynecology

## 2015-11-24 DIAGNOSIS — R0602 Shortness of breath: Secondary | ICD-10-CM | POA: Diagnosis not present

## 2015-11-24 DIAGNOSIS — R928 Other abnormal and inconclusive findings on diagnostic imaging of breast: Secondary | ICD-10-CM

## 2015-12-17 ENCOUNTER — Other Ambulatory Visit: Payer: Self-pay

## 2015-12-17 ENCOUNTER — Telehealth: Payer: Self-pay | Admitting: Neurology

## 2015-12-17 DIAGNOSIS — G4733 Obstructive sleep apnea (adult) (pediatric): Secondary | ICD-10-CM

## 2015-12-17 NOTE — Telephone Encounter (Signed)
Patient called to schedule her CPAP Titration however I don't have an order.  I did see in the notes that she was told she needed one.  Please place an order so I can get her scheduled

## 2015-12-17 NOTE — Telephone Encounter (Signed)
Order is in for cpap titration.

## 2015-12-20 ENCOUNTER — Ambulatory Visit (INDEPENDENT_AMBULATORY_CARE_PROVIDER_SITE_OTHER): Payer: Medicare Other | Admitting: Neurology

## 2015-12-20 DIAGNOSIS — G4733 Obstructive sleep apnea (adult) (pediatric): Secondary | ICD-10-CM | POA: Diagnosis not present

## 2015-12-21 NOTE — Sleep Study (Signed)
Please see the scanned sleep study interpretation located in the Procedure tab within the Chart Review section. 

## 2015-12-22 ENCOUNTER — Other Ambulatory Visit: Payer: Self-pay

## 2015-12-22 ENCOUNTER — Inpatient Hospital Stay: Admission: RE | Admit: 2015-12-22 | Payer: Self-pay | Source: Ambulatory Visit

## 2015-12-29 DIAGNOSIS — K5900 Constipation, unspecified: Secondary | ICD-10-CM | POA: Diagnosis not present

## 2015-12-29 DIAGNOSIS — R3915 Urgency of urination: Secondary | ICD-10-CM | POA: Diagnosis not present

## 2015-12-29 DIAGNOSIS — N390 Urinary tract infection, site not specified: Secondary | ICD-10-CM | POA: Diagnosis not present

## 2015-12-29 DIAGNOSIS — R32 Unspecified urinary incontinence: Secondary | ICD-10-CM | POA: Diagnosis not present

## 2015-12-30 DIAGNOSIS — R32 Unspecified urinary incontinence: Secondary | ICD-10-CM | POA: Insufficient documentation

## 2015-12-31 ENCOUNTER — Telehealth: Payer: Self-pay

## 2015-12-31 DIAGNOSIS — G4733 Obstructive sleep apnea (adult) (pediatric): Secondary | ICD-10-CM

## 2015-12-31 NOTE — Telephone Encounter (Signed)
I called pt to discuss sleep study results, no answer, left a message asking her to call me back. If pt returns my call tomorrow, please advise her that I will call her back on Monday.

## 2016-01-05 NOTE — Telephone Encounter (Signed)
Called and left another message asking pt to call be back.

## 2016-01-07 ENCOUNTER — Ambulatory Visit
Admission: RE | Admit: 2016-01-07 | Discharge: 2016-01-07 | Disposition: A | Discharge status: Home / Self Care | Payer: Medicare Other | Source: Ambulatory Visit | Attending: Obstetrics and Gynecology | Admitting: Obstetrics and Gynecology

## 2016-01-07 DIAGNOSIS — R928 Other abnormal and inconclusive findings on diagnostic imaging of breast: Secondary | ICD-10-CM

## 2016-01-07 DIAGNOSIS — N6011 Diffuse cystic mastopathy of right breast: Secondary | ICD-10-CM | POA: Diagnosis not present

## 2016-01-14 NOTE — Telephone Encounter (Signed)
Called pt again, no answer, left a message asking her to call me back. Will mail a letter.

## 2016-01-15 NOTE — Telephone Encounter (Signed)
Pt returned [name]'s call °

## 2016-01-19 NOTE — Addendum Note (Signed)
Addended by: Lester [place] A on: 01/19/2016 03:31 PM   Modules accepted: Orders

## 2016-01-19 NOTE — Telephone Encounter (Signed)
Spoke to pt regarding sleep study results. I advised her that Dr. Brett Fairy reviewed her sleep study results and found that cpap was effective and there was significant improvement with less respiratory events during titration and Dr. Brett Fairy recommends starting a cpap. Pt is agreeable. I advised pt that there were some PLMS noted in her study, and if they don't resolve with cpap, Dr. Brett Fairy will discuss treatment at the follow up appt. I advised pt to use her cpap at least four or more hours per night. Pt verbalized understanding. Will send to [facility].

## 2016-01-19 NOTE — Telephone Encounter (Signed)
I called pt again, no answer, left a message asking her to call me back. If pt calls back, please skype me. This is the 4th phone call that I have made to the pt. Please also obtain a phone number for the pt that she will answer.

## 2016-01-28 DIAGNOSIS — Z7901 Long term (current) use of anticoagulants: Secondary | ICD-10-CM | POA: Diagnosis not present

## 2016-01-28 DIAGNOSIS — I809 Phlebitis and thrombophlebitis of unspecified site: Secondary | ICD-10-CM | POA: Diagnosis not present

## 2016-01-28 DIAGNOSIS — I2699 Other pulmonary embolism without acute cor pulmonale: Secondary | ICD-10-CM | POA: Diagnosis not present

## 2016-02-05 DIAGNOSIS — G4733 Obstructive sleep apnea (adult) (pediatric): Secondary | ICD-10-CM | POA: Diagnosis not present

## 2016-02-09 DIAGNOSIS — E559 Vitamin D deficiency, unspecified: Secondary | ICD-10-CM | POA: Diagnosis not present

## 2016-02-09 DIAGNOSIS — K625 Hemorrhage of anus and rectum: Secondary | ICD-10-CM | POA: Diagnosis not present

## 2016-02-09 DIAGNOSIS — R7301 Impaired fasting glucose: Secondary | ICD-10-CM | POA: Diagnosis not present

## 2016-02-09 DIAGNOSIS — N390 Urinary tract infection, site not specified: Secondary | ICD-10-CM | POA: Diagnosis not present

## 2016-02-09 DIAGNOSIS — E7800 Pure hypercholesterolemia, unspecified: Secondary | ICD-10-CM | POA: Diagnosis not present

## 2016-02-09 DIAGNOSIS — I10 Essential (primary) hypertension: Secondary | ICD-10-CM | POA: Diagnosis not present

## 2016-02-11 DIAGNOSIS — M25569 Pain in unspecified knee: Secondary | ICD-10-CM | POA: Diagnosis not present

## 2016-02-11 DIAGNOSIS — W19XXXA Unspecified fall, initial encounter: Secondary | ICD-10-CM | POA: Diagnosis not present

## 2016-02-11 DIAGNOSIS — M25519 Pain in unspecified shoulder: Secondary | ICD-10-CM | POA: Diagnosis not present

## 2016-02-11 DIAGNOSIS — M25551 Pain in right hip: Secondary | ICD-10-CM | POA: Diagnosis not present

## 2016-02-11 DIAGNOSIS — M25552 Pain in left hip: Secondary | ICD-10-CM | POA: Diagnosis not present

## 2016-02-11 DIAGNOSIS — S3992XA Unspecified injury of lower back, initial encounter: Secondary | ICD-10-CM | POA: Diagnosis not present

## 2016-02-11 DIAGNOSIS — M25512 Pain in left shoulder: Secondary | ICD-10-CM | POA: Diagnosis not present

## 2016-02-11 DIAGNOSIS — M25511 Pain in right shoulder: Secondary | ICD-10-CM | POA: Diagnosis not present

## 2016-02-11 DIAGNOSIS — M25559 Pain in unspecified hip: Secondary | ICD-10-CM | POA: Diagnosis not present

## 2016-02-11 DIAGNOSIS — M549 Dorsalgia, unspecified: Secondary | ICD-10-CM | POA: Diagnosis not present

## 2016-02-11 DIAGNOSIS — M542 Cervicalgia: Secondary | ICD-10-CM | POA: Diagnosis not present

## 2016-02-11 DIAGNOSIS — M47812 Spondylosis without myelopathy or radiculopathy, cervical region: Secondary | ICD-10-CM | POA: Diagnosis not present

## 2016-02-11 DIAGNOSIS — G8911 Acute pain due to trauma: Secondary | ICD-10-CM | POA: Diagnosis not present

## 2016-02-11 DIAGNOSIS — I10 Essential (primary) hypertension: Secondary | ICD-10-CM | POA: Diagnosis not present

## 2016-02-11 DIAGNOSIS — M546 Pain in thoracic spine: Secondary | ICD-10-CM | POA: Diagnosis not present

## 2016-02-18 DIAGNOSIS — R52 Pain, unspecified: Secondary | ICD-10-CM | POA: Diagnosis not present

## 2016-02-27 DIAGNOSIS — I809 Phlebitis and thrombophlebitis of unspecified site: Secondary | ICD-10-CM | POA: Diagnosis not present

## 2016-02-27 DIAGNOSIS — I2699 Other pulmonary embolism without acute cor pulmonale: Secondary | ICD-10-CM | POA: Diagnosis not present

## 2016-02-27 DIAGNOSIS — Z7901 Long term (current) use of anticoagulants: Secondary | ICD-10-CM | POA: Diagnosis not present

## 2016-02-29 ENCOUNTER — Other Ambulatory Visit: Payer: Self-pay | Admitting: Gastroenterology

## 2016-02-29 DIAGNOSIS — Z86010 Personal history of colonic polyps: Secondary | ICD-10-CM | POA: Diagnosis not present

## 2016-02-29 DIAGNOSIS — K625 Hemorrhage of anus and rectum: Secondary | ICD-10-CM | POA: Diagnosis not present

## 2016-02-29 DIAGNOSIS — K219 Gastro-esophageal reflux disease without esophagitis: Secondary | ICD-10-CM | POA: Diagnosis not present

## 2016-03-03 DIAGNOSIS — R399 Unspecified symptoms and signs involving the genitourinary system: Secondary | ICD-10-CM | POA: Diagnosis not present

## 2016-03-03 DIAGNOSIS — N390 Urinary tract infection, site not specified: Secondary | ICD-10-CM | POA: Diagnosis not present

## 2016-03-06 DIAGNOSIS — G4733 Obstructive sleep apnea (adult) (pediatric): Secondary | ICD-10-CM | POA: Diagnosis not present

## 2016-03-24 DIAGNOSIS — Z86718 Personal history of other venous thrombosis and embolism: Secondary | ICD-10-CM | POA: Diagnosis not present

## 2016-03-28 DIAGNOSIS — I2699 Other pulmonary embolism without acute cor pulmonale: Secondary | ICD-10-CM | POA: Diagnosis not present

## 2016-03-28 DIAGNOSIS — I809 Phlebitis and thrombophlebitis of unspecified site: Secondary | ICD-10-CM | POA: Diagnosis not present

## 2016-03-28 DIAGNOSIS — Z7901 Long term (current) use of anticoagulants: Secondary | ICD-10-CM | POA: Diagnosis not present

## 2016-03-31 ENCOUNTER — Ambulatory Visit: Payer: Self-pay | Admitting: Neurology

## 2016-03-31 ENCOUNTER — Telehealth: Payer: Self-pay

## 2016-03-31 NOTE — Telephone Encounter (Signed)
Pt did not show for their appt with Dr. Dohmeier today.  

## 2016-04-01 ENCOUNTER — Encounter: Payer: Self-pay | Admitting: Neurology

## 2016-04-04 NOTE — Progress Notes (Signed)
04-04-16 Multiple attempts to reach pt [phone]- no answer, unable to leave message, please notify [facility] of any further contact numbers to [phone], thanks.

## 2016-04-05 ENCOUNTER — Encounter (HOSPITAL_COMMUNITY): Payer: Self-pay | Admitting: *Deleted

## 2016-04-06 ENCOUNTER — Encounter (HOSPITAL_COMMUNITY): Payer: Self-pay | Admitting: Emergency Medicine

## 2016-04-06 ENCOUNTER — Emergency Department (HOSPITAL_COMMUNITY): Payer: Medicare Other

## 2016-04-06 ENCOUNTER — Emergency Department (HOSPITAL_COMMUNITY)
Admission: EM | Admit: 2016-04-06 | Discharge: 2016-04-06 | Disposition: A | Discharge status: Home / Self Care | Payer: Medicare Other | Attending: Emergency Medicine | Admitting: Emergency Medicine

## 2016-04-06 DIAGNOSIS — J449 Chronic obstructive pulmonary disease, unspecified: Secondary | ICD-10-CM | POA: Diagnosis not present

## 2016-04-06 DIAGNOSIS — Z7901 Long term (current) use of anticoagulants: Secondary | ICD-10-CM | POA: Insufficient documentation

## 2016-04-06 DIAGNOSIS — N390 Urinary tract infection, site not specified: Secondary | ICD-10-CM | POA: Diagnosis not present

## 2016-04-06 DIAGNOSIS — R1030 Lower abdominal pain, unspecified: Secondary | ICD-10-CM

## 2016-04-06 DIAGNOSIS — R1032 Left lower quadrant pain: Secondary | ICD-10-CM | POA: Diagnosis not present

## 2016-04-06 DIAGNOSIS — Z86711 Personal history of pulmonary embolism: Secondary | ICD-10-CM | POA: Diagnosis not present

## 2016-04-06 DIAGNOSIS — I10 Essential (primary) hypertension: Secondary | ICD-10-CM | POA: Insufficient documentation

## 2016-04-06 DIAGNOSIS — G4733 Obstructive sleep apnea (adult) (pediatric): Secondary | ICD-10-CM | POA: Diagnosis not present

## 2016-04-06 DIAGNOSIS — J45909 Unspecified asthma, uncomplicated: Secondary | ICD-10-CM | POA: Diagnosis not present

## 2016-04-06 DIAGNOSIS — R109 Unspecified abdominal pain: Secondary | ICD-10-CM | POA: Diagnosis not present

## 2016-04-06 DIAGNOSIS — Z79899 Other long term (current) drug therapy: Secondary | ICD-10-CM | POA: Diagnosis not present

## 2016-04-06 DIAGNOSIS — Z7982 Long term (current) use of aspirin: Secondary | ICD-10-CM | POA: Insufficient documentation

## 2016-04-06 DIAGNOSIS — I252 Old myocardial infarction: Secondary | ICD-10-CM | POA: Insufficient documentation

## 2016-04-06 DIAGNOSIS — Z472 Encounter for removal of internal fixation device: Secondary | ICD-10-CM | POA: Insufficient documentation

## 2016-04-06 LAB — URINE MICROSCOPIC-ADD ON

## 2016-04-06 LAB — COMPREHENSIVE METABOLIC PANEL
ALT: 17 U/L (ref 14–54)
ANION GAP: 8 (ref 5–15)
AST: 25 U/L (ref 15–41)
Albumin: 4.1 g/dL (ref 3.5–5.0)
Alkaline Phosphatase: 71 U/L (ref 38–126)
BILIRUBIN TOTAL: 0.9 mg/dL (ref 0.3–1.2)
BUN: 14 mg/dL (ref 6–20)
CO2: 25 mmol/L (ref 22–32)
Calcium: 9.3 mg/dL (ref 8.9–10.3)
Chloride: 105 mmol/L (ref 101–111)
Creatinine, Ser: 0.7 mg/dL (ref 0.44–1.00)
GFR calc Af Amer: >60 mL/min (ref >60)
Glucose, Bld: 97 mg/dL (ref 65–99)
POTASSIUM: 3.7 mmol/L (ref 3.5–5.1)
Sodium: 138 mmol/L (ref 135–145)
TOTAL PROTEIN: 7.8 g/dL (ref 6.5–8.1)

## 2016-04-06 LAB — CBC
HEMATOCRIT: 44.1 % (ref 36.0–46.0)
HEMOGLOBIN: 13.8 g/dL (ref 12.0–15.0)
MCH: 26 pg (ref 26.0–34.0)
MCHC: 31.3 g/dL (ref 30.0–36.0)
MCV: 83.2 fL (ref 78.0–100.0)
Platelets: 322 10*3/uL (ref 150–400)
RBC: 5.3 MIL/uL — AB (ref 3.87–5.11)
RDW: 15.6 % — ABNORMAL HIGH (ref 11.5–15.5)
WBC: 8.3 10*3/uL (ref 4.0–10.5)

## 2016-04-06 LAB — PROTIME-INR
INR: 1.2 (ref 0.00–1.49)
Prothrombin Time: 15 seconds (ref 11.6–15.2)

## 2016-04-06 LAB — URINALYSIS, ROUTINE W REFLEX MICROSCOPIC
Bilirubin Urine: NEGATIVE
Glucose, UA: NEGATIVE mg/dL
Hgb urine dipstick: NEGATIVE
KETONES UR: NEGATIVE mg/dL
NITRITE: NEGATIVE
PH: 7 (ref 5.0–8.0)
Protein, ur: NEGATIVE mg/dL
SPECIFIC GRAVITY, URINE: 1.02 (ref 1.005–1.030)

## 2016-04-06 LAB — LIPASE, BLOOD: LIPASE: 16 U/L (ref 11–51)

## 2016-04-06 MED ORDER — MORPHINE SULFATE (PF) 4 MG/ML IV SOLN
4.0000 mg | Freq: Once | INTRAVENOUS | Status: AC
  Administered 2016-04-06: 4 mg via INTRAVENOUS
  Filled 2016-04-06: qty 1

## 2016-04-06 MED ORDER — DIATRIZOATE MEGLUMINE & SODIUM 66-10 % PO SOLN: 15.0000 mL | ORAL | Status: DC | PRN

## 2016-04-06 MED ORDER — IOPAMIDOL (ISOVUE-300) INJECTION 61%
100.0000 mL | Freq: Once | INTRAVENOUS | Status: AC | PRN
  Administered 2016-04-06: 100 mL via INTRAVENOUS

## 2016-04-06 MED ORDER — CEPHALEXIN 500 MG PO CAPS: 500.0000 mg | ORAL_CAPSULE | Freq: Four times a day (QID) | ORAL | Status: DC

## 2016-04-06 MED ORDER — HYDROCODONE-ACETAMINOPHEN 5-325 MG PO TABS: 1.0000 | ORAL_TABLET | Freq: Four times a day (QID) | ORAL | Status: DC | PRN

## 2016-04-06 MED ORDER — ONDANSETRON HCL 4 MG/2ML IJ SOLN
4.0000 mg | Freq: Once | INTRAMUSCULAR | Status: AC
  Administered 2016-04-06: 4 mg via INTRAVENOUS
  Filled 2016-04-06: qty 2

## 2016-04-06 MED ORDER — CEPHALEXIN 500 MG PO CAPS
1000.0000 mg | ORAL_CAPSULE | Freq: Once | ORAL | Status: AC
  Administered 2016-04-06: 1000 mg via ORAL
  Filled 2016-04-06: qty 2

## 2016-04-06 NOTE — ED Provider Notes (Signed)
CSN: [id number]     Arrival date & time 04/06/16  1552 History   First MD Initiated Contact with Patient 04/06/16 1846     Chief Complaint  Patient presents with  . Abdominal Pain     (Consider location/radiation/quality/duration/timing/severity/associated sxs/prior Treatment) Patient is a 60 y.o. female presenting with abdominal pain. The history is provided by the patient and a relative.  Abdominal Pain Associated symptoms: dysuria and nausea   Associated symptoms: no chest pain, no chills, no constipation, no cough, no diarrhea, no fever, no shortness of breath and no sore throat   Patient with hx colon diverticula, c/o lower abdominal pain, esp left, for the past 1-2 days.  Indicates pcp told patient needed to come to ED for CT.  Pain constant, dull, moderate, similar to prior diverticulitis associated pain.  No radiation.  Pt also w suprapubic discomfort. ?mild dysuria. Hx uti. No back or flank pain. Denies fever or chills. Nausea. No vomiting. Having normal stools.  States had been on coumadin for hx PE, however states due to plan for colonoscopy this Friday, has been on lovenox.  No abnormal bruising or bleeding.  Prior abd surgical hx of cholecystectomy and hysterectomy.       Past Medical History  Diagnosis Date  . Asthma   . Pulmonary embolism ([place]) last 2014    x 3   . High cholesterol   . Acid reflux   . Heart murmur   . Hiatal hernia   . Diverticulitis   . COPD (chronic obstructive pulmonary disease) ([place])   . Chronic pain   . Labral tear of shoulder     left  . Sleep apnea     uses cpap, pt does not know settings  . Hypertension   . Pneumonia 2014  . Myocardial infarction ([place]) 2014  . Ruptured disc, cervical     1 in neck, 1 in midback and 1 in low back, 6 bulging discs,  upper and lower back, 5 slipped discs all the way uop and down back and    Past Surgical History  Procedure Laterality Date  . Replacement total knee Right   . Cholecystectomy    .  Abdominal hysterectomy    . Joint replacement    . Carpal tunnel release Right   .  bone spurs removed from feet Bilateral   . Tubal ligation    . Back surgery      fusion x 2 lower back, laser surgery mid and back and neck also done  . Colonscopy      x 2, with polyps removed both times   Family History  Problem Relation Age of Onset  . Heart failure Mother   . COPD Mother   . Heart failure Father   . COPD Father   . Cancer Other   . Heart failure Other    Social History  Substance Use Topics  . Smoking status: Never Smoker   . Smokeless tobacco: Never Used  . Alcohol Use: No   OB History    No data available     Review of Systems  Constitutional: Negative for fever and chills.  HENT: Negative for sore throat.   Eyes: Negative for redness.  Respiratory: Negative for cough and shortness of breath.   Cardiovascular: Negative for chest pain.  Gastrointestinal: Positive for nausea and abdominal pain. Negative for diarrhea and constipation.  Genitourinary: Positive for dysuria. Negative for flank pain.  Musculoskeletal: Negative for back pain and neck pain.  Skin: Negative for rash.  Neurological: Negative for headaches.  Hematological: Does not bruise/bleed easily.  Psychiatric/Behavioral: Negative for confusion.      Allergies  Latex; Other; Shellfish allergy; and Tape  Home Medications   Prior to Admission medications   Medication Sig Start Date End Date Taking? Authorizing Provider  albuterol (PROVENTIL HFA;VENTOLIN HFA) 108 (90 BASE) MCG/ACT inhaler Inhale 2 puffs into the lungs every 6 (six) hours as needed. For wheezing 02/28/13  Yes Janece Canterbury, MD  aspirin EC 81 MG tablet Take 81 mg by mouth daily at 12 noon.    Yes Historical Provider, MD  Cholecalciferol (VITAMIN D3) 5000 units TABS Take 5,000 Units by mouth daily at 12 noon.    Yes Historical Provider, MD  cyclobenzaprine (FLEXERIL) 10 MG tablet Take 10 mg by mouth 3 (three) times daily as needed for  muscle spasms.    Yes Historical Provider, MD  diazepam (VALIUM) 10 MG tablet Take 10 mg by mouth every 8 (eight) hours as needed for anxiety.    Yes Historical Provider, MD  diclofenac sodium (VOLTAREN) 1 % GEL Apply 2 g topically 4 (four) times daily as needed (For pain in shoulder, knees, back and neck.).    Yes Historical Provider, MD  enoxaparin (LOVENOX) 40 MG/0.4ML injection Inject 40 mg into the skin daily. Take 3 days prior to procedure and as directed after procedure. Started on 04/05/16. 03/24/16  Yes Historical Provider, MD  furosemide (LASIX) 40 MG tablet Take 40 mg by mouth 2 (two) times daily as needed for fluid. For fluid retention.   Yes Historical Provider, MD  lidocaine (LIDODERM) 5 % Place 1-3 patches onto the skin daily. Remove & Discard patch within 12 hours or as directed by MD   Yes Historical Provider, MD  losartan (COZAAR) 50 MG tablet Take 25 mg by mouth daily at 12 noon.    Yes Historical Provider, MD  metoprolol succinate (TOPROL-XL) 50 MG 24 hr tablet Take 50 mg by mouth daily at 12 noon. Take with or immediately following a meal.   Yes Historical Provider, MD  nitroGLYCERIN (NITROSTAT) 0.4 MG SL tablet Place 0.4 mg under the tongue every 5 (five) minutes as needed for chest pain.   Yes Historical Provider, MD  omeprazole (PRILOSEC) 40 MG capsule Take 40 mg by mouth 2 (two) times daily.    Yes Historical Provider, MD  Oxycodone HCl 10 MG TABS Take 10 mg by mouth 3 (three) times daily as needed (For pain.).  03/03/16  Yes Historical Provider, MD  pravastatin (PRAVACHOL) 40 MG tablet Take 40 mg by mouth daily at 12 noon.    Yes Historical Provider, MD  promethazine (PHENERGAN) 25 MG tablet Take 1 tablet (25 mg total) by mouth every 6 (six) hours as needed for nausea. 04/19/13  Yes Robbie Lis, MD  isometheptene-acetaminophen-dichloralphenazone (MIDRIN) [phone] MG capsule Take 1-2 capsules by mouth 4 (four) times daily as needed. For headaches. Max dose is 5 tabs in 24 hrs.  Take 2 tabs at the onset of headache then take 1 tab each hour if needed.    Historical Provider, MD  warfarin (COUMADIN) 5 MG tablet Take 2.5-5 mg by mouth daily at 12 noon. Take one tablet (5mg ) on Monday, Wednesday, Friday and half a tablet (2.5mg ) the rest of the week. 07/20/15   Historical Provider, MD   BP 124/68 mmHg  Pulse 72  Temp(Src) 97.4 F (36.3 C) (Oral)  Resp 16  Ht 5\' 3"  (1.6 m)  Wt  108.863 kg  BMI 42.52 kg/m2  SpO2 98% Physical Exam  Constitutional: She appears well-developed and well-nourished. No distress.  HENT:  Mouth/Throat: Oropharynx is clear and moist.  Eyes: Conjunctivae are normal. No scleral icterus.  Neck: Neck supple. No tracheal deviation present.  Cardiovascular: Normal rate, regular rhythm, normal heart sounds and intact distal pulses.   No murmur heard. Pulmonary/Chest: Effort normal and breath sounds normal. No respiratory distress.  Abdominal: Soft. Normal appearance and bowel sounds are normal. She exhibits no distension. There is tenderness.  Moderate LLQ tenderness. No peritonitis.  No incarc hernia.   Genitourinary:  No cva tenderness  Musculoskeletal: She exhibits no edema.  Neurological: She is alert.  Skin: Skin is warm and dry. No rash noted. She is not diaphoretic.  Psychiatric: She has a normal mood and affect.  Nursing note and vitals reviewed.   ED Course  Procedures (including critical care time) Labs Review   Results for orders placed or performed during the hospital encounter of 04/06/16  Lipase, blood  Result Value Ref Range   Lipase 16 11 - 51 U/L  Comprehensive metabolic panel  Result Value Ref Range   Sodium 138 135 - 145 mmol/L   Potassium 3.7 3.5 - 5.1 mmol/L   Chloride 105 101 - 111 mmol/L   CO2 25 22 - 32 mmol/L   Glucose, Bld 97 65 - 99 mg/dL   BUN 14 6 - 20 mg/dL   Creatinine, Ser 0.70 0.44 - 1.00 mg/dL   Calcium 9.3 8.9 - 10.3 mg/dL   Total Protein 7.8 6.5 - 8.1 g/dL   Albumin 4.1 3.5 - 5.0 g/dL   AST 25  15 - 41 U/L   ALT 17 14 - 54 U/L   Alkaline Phosphatase 71 38 - 126 U/L   Total Bilirubin 0.9 0.3 - 1.2 mg/dL   GFR calc non Af Amer >60 >60 mL/min   GFR calc Af Amer >60 >60 mL/min   Anion gap 8 5 - 15  CBC  Result Value Ref Range   WBC 8.3 4.0 - 10.5 K/uL   RBC 5.30 (H) 3.87 - 5.11 MIL/uL   Hemoglobin 13.8 12.0 - 15.0 g/dL   HCT 44.1 36.0 - 46.0 %   MCV 83.2 78.0 - 100.0 fL   MCH 26.0 26.0 - 34.0 pg   MCHC 31.3 30.0 - 36.0 g/dL   RDW 15.6 (H) 11.5 - 15.5 %   Platelets 322 150 - 400 K/uL  Urinalysis, Routine w reflex microscopic  Result Value Ref Range   Color, Urine YELLOW YELLOW   APPearance CLOUDY (A) CLEAR   Specific Gravity, Urine 1.020 1.005 - 1.030   pH 7.0 5.0 - 8.0   Glucose, UA NEGATIVE NEGATIVE mg/dL   Hgb urine dipstick NEGATIVE NEGATIVE   Bilirubin Urine NEGATIVE NEGATIVE   Ketones, ur NEGATIVE NEGATIVE mg/dL   Protein, ur NEGATIVE NEGATIVE mg/dL   Nitrite NEGATIVE NEGATIVE   Leukocytes, UA SMALL (A) NEGATIVE  Protime-INR  Result Value Ref Range   Prothrombin Time 15.0 11.6 - 15.2 seconds   INR 1.20 0.00 - 1.49  Urine microscopic-add on  Result Value Ref Range   Squamous Epithelial / LPF 6-30 (A) NONE SEEN   WBC, UA 6-30 0 - 5 WBC/hpf   RBC / HPF 0-5 0 - 5 RBC/hpf   Bacteria, UA MANY (A) NONE SEEN   Ct Abdomen Pelvis W Contrast  04/06/2016  CLINICAL DATA:  Abdominal pain. Previous history of diverticulitis. Patient was referred  here for CT scan by primary care physician. EXAM: CT ABDOMEN AND PELVIS WITH CONTRAST TECHNIQUE: Multidetector CT imaging of the abdomen and pelvis was performed using the standard protocol following bolus administration of intravenous contrast. CONTRAST:  128mL ISOVUE-300 IOPAMIDOL (ISOVUE-300) INJECTION 61% COMPARISON:  04/16/2013 FINDINGS: Mild dependent changes in the lung bases. Surgical absence of the gallbladder. No bile duct dilatation. Tiny sub cm low-attenuation lesions in the spleen without change since prior study. They are  too small to characterize but probably represent small cysts or hemangiomas. The liver, pancreas, adrenal glands, kidneys, abdominal aorta, inferior vena cava, and retroperitoneal lymph nodes are unremarkable. Stomach, small bowel, and colon are not abnormally distended. Stool fills the colon. No free air or free fluid in the abdomen. Pelvis: Appendix is not identified. Diverticulosis of the sigmoid colon with diffuse sigmoid wall thickening likely representing muscular hypertrophy. No inflammatory changes demonstrated suggesting no evidence of diverticulitis. No free or loculated pelvic fluid collections. Uterus is surgically absent. Ovaries are not enlarged. No pelvic mass or lymphadenopathy. Postoperative changes with posterior fixation of L4-L5. Diffuse degenerative change throughout the lumbar spine. No destructive bone lesions. Tiny amount of subcutaneous gas in the anterior abdominal wall likely representing injection site. IMPRESSION: No acute process demonstrated in the abdomen or pelvis. Diverticulosis and muscular hypertrophy of the sigmoid colon. No discrete evidence of diverticulitis at this time. Electronically Signed   By: Lucienne Capers M.D.   On: 04/06/2016 21:55       I have personally reviewed and evaluated these images and lab results as part of my medical decision-making.    MDM   Iv ns. Labs.   LLQ tenderness, will get CT.  Morphine for pain. zofran for nausea.  Post ct, pain persists, mild nausea. Morphine and zofran for symptom relief.    CT neg acute.   Discussed results w pt.   ua positive.  Last prior pos u cx in care everywhere 3/17, ecoli, sens to cefazolin.  Keflex po, rx for home.  Patient currently appears stable for d/c.   rec close pcp f/u. Return precautions provided.      Lajean Saver, MD 04/06/16 2228

## 2016-04-06 NOTE — Discharge Instructions (Signed)
It was our pleasure to provide your ER care today - we hope that you feel better.  The lab tests show a probable urine infection - take antibiotic (keflex) as prescribed.   A urine culture was sent the results of which will be back in 2-3 days time - have your doctor follow up on those results then.  Take motrin or aleve as need for pain. You may also take hydrocodone as need for pain. No driving when taking hydrocodone. Also, do not take tylenol or acetaminophen containing medication, or any other narcotic type pain medication,  when taking hydrocodone.  Follow up with your doctor in the next couple days for recheck if symptoms fail to improve/resolve.  Return to ER if worse, new symptoms, worsening or severe pain, persistent vomiting, weak/faint, other concern.  You were given pain medication in the ER - no driving for the next 6 hours.     Abdominal Pain, Adult Many things can cause abdominal pain. Usually, abdominal pain is not caused by a disease and will improve without treatment. It can often be observed and treated at home. Your health care provider will do a physical exam and possibly order blood tests and X-rays to help determine the seriousness of your pain. However, in many cases, more time must pass before a clear cause of the pain can be found. Before that point, your health care provider may not know if you need more testing or further treatment. HOME CARE INSTRUCTIONS Monitor your abdominal pain for any changes. The following actions may help to alleviate any discomfort you are experiencing:  Only take over-the-counter or prescription medicines as directed by your health care provider.  Do not take laxatives unless directed to do so by your health care provider.  Try a clear liquid diet (broth, tea, or water) as directed by your health care provider. Slowly move to a bland diet as tolerated. SEEK MEDICAL CARE IF:  You have unexplained abdominal pain.  You have abdominal  pain associated with nausea or diarrhea.  You have pain when you urinate or have a bowel movement.  You experience abdominal pain that wakes you in the night.  You have abdominal pain that is worsened or improved by eating food.  You have abdominal pain that is worsened with eating fatty foods.  You have a fever. SEEK IMMEDIATE MEDICAL CARE IF:  Your pain does not go away within 2 hours.  You keep throwing up (vomiting).  Your pain is felt only in portions of the abdomen, such as the right side or the left lower portion of the abdomen.  You pass bloody or black tarry stools. MAKE SURE YOU:  Understand these instructions.  Will watch your condition.  Will get help right away if you are not doing well or get worse.   This information is not intended to replace advice given to you by your health care provider. Make sure you discuss any questions you have with your health care provider.   Document Released: 07/20/2005 Document Revised: 07/01/2015 Document Reviewed: 06/19/2013 Elsevier Interactive Patient Education 2016 [employer]     Urinary Tract Infection Urinary tract infections (UTIs) can develop anywhere along your urinary tract. Your urinary tract is your body's drainage system for removing wastes and extra water. Your urinary tract includes two kidneys, two ureters, a bladder, and a urethra. Your kidneys are a pair of bean-shaped organs. Each kidney is about the size of your fist. They are located below your ribs, one on  each side of your spine. CAUSES Infections are caused by microbes, which are microscopic organisms, including fungi, viruses, and bacteria. These organisms are so small that they can only be seen through a microscope. Bacteria are the microbes that most commonly cause UTIs. SYMPTOMS  Symptoms of UTIs may vary by age and gender of the patient and by the location of the infection. Symptoms in young women typically include a frequent and intense urge to  urinate and a painful, burning feeling in the bladder or urethra during urination. Older women and men are more likely to be tired, shaky, and weak and have muscle aches and abdominal pain. A fever may mean the infection is in your kidneys. Other symptoms of a kidney infection include pain in your back or sides below the ribs, nausea, and vomiting. DIAGNOSIS To diagnose a UTI, your caregiver will ask you about your symptoms. Your caregiver will also ask you to provide a urine sample. The urine sample will be tested for bacteria and white blood cells. White blood cells are made by your body to help fight infection. TREATMENT  Typically, UTIs can be treated with medication. Because most UTIs are caused by a bacterial infection, they usually can be treated with the use of antibiotics. The choice of antibiotic and length of treatment depend on your symptoms and the type of bacteria causing your infection. HOME CARE INSTRUCTIONS  If you were prescribed antibiotics, take them exactly as your caregiver instructs you. Finish the medication even if you feel better after you have only taken some of the medication.  Drink enough water and fluids to keep your urine clear or pale yellow.  Avoid caffeine, tea, and carbonated beverages. They tend to irritate your bladder.  Empty your bladder often. Avoid holding urine for long periods of time.  Empty your bladder before and after sexual intercourse.  After a bowel movement, women should cleanse from front to back. Use each tissue only once. SEEK MEDICAL CARE IF:   You have back pain.  You develop a fever.  Your symptoms do not begin to resolve within 3 days. SEEK IMMEDIATE MEDICAL CARE IF:   You have severe back pain or lower abdominal pain.  You develop chills.  You have nausea or vomiting.  You have continued burning or discomfort with urination. MAKE SURE YOU:   Understand these instructions.  Will watch your condition.  Will get help  right away if you are not doing well or get worse.   This information is not intended to replace advice given to you by your health care provider. Make sure you discuss any questions you have with your health care provider.   Document Released: 07/20/2005 Document Revised: 07/01/2015 Document Reviewed: 11/18/2011 Elsevier Interactive Patient Education [employer].

## 2016-04-06 NOTE — ED Notes (Signed)
Patient here from home with complaints of abd pain. Hx of diverticulitis. Reports primary referred her here for CT scan.

## 2016-04-07 NOTE — H&P (Signed)
[name] HPI: The patient reports some issues with hematochezia that is associated with a bowel movement. This issues has been ongoing for years. 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 Her last colonoscopy was with Dr. Cristina Gong on 11/05/2007 with findings of two ascending colon adenomas and even at that time she had issues with hematochezia. Her last PE was in 2013 or 2014 and her first PE was when she was giving birth. All of these situations were associated with trauma, but she was recommended to remain on anticoagulation indefinitely. In 2014 she had an MI and earlier this year she had some chest pain that was felt to be noncardiac related. She has been fine since.  Past Medical History  Diagnosis Date  . Asthma   . Pulmonary embolism ([place]) last 2014    x 3   . High cholesterol   . Acid reflux   . Heart murmur   . Hiatal hernia   . Diverticulitis   . COPD (chronic obstructive pulmonary disease) ([place])   . Chronic pain   . Labral tear of shoulder     left  . Sleep apnea     uses cpap, pt does not know settings  . Hypertension   . Pneumonia 2014  . Myocardial infarction ([place]) 2014  . Ruptured disc, cervical     1 in neck, 1 in midback and 1 in low back, 6 bulging discs,  upper and lower back, 5 slipped discs all the way uop and down back and     Past Surgical History  Procedure Laterality Date  . Replacement total knee Right   . Cholecystectomy    . Abdominal hysterectomy    . Joint replacement    . Carpal tunnel release Right   .  bone spurs removed from feet Bilateral   . Tubal ligation    . Back surgery      fusion x 2 lower back, laser surgery mid and back and neck also done  . Colonscopy      x 2, with polyps removed both times    Family History  Problem Relation Age of Onset  . Heart failure Mother   . COPD Mother   . Heart failure Father   . COPD Father   . Cancer Other   . Heart failure Other     Social History:  reports that she has  never smoked. She has never used smokeless tobacco. She reports that she does not drink alcohol or use illicit drugs.  Allergies:  Allergies  Allergen Reactions  . Latex Other (See Comments)    Stings and burns me  . Other Nausea And Vomiting and Other (See Comments)    Bleach - burns esophagus, breaks me out and burns me  . Shellfish Allergy Hives and Nausea And Vomiting  . Tape Other (See Comments)    Plastic tape irritates skin    Medications: Scheduled: Continuous:  Results for orders placed or performed during the hospital encounter of 04/06/16 (from the past 24 hour(s))  Lipase, blood     Status: None   Collection Time: 04/06/16  5:04 PM  Result Value Ref Range   Lipase 16 11 - 51 U/L  Comprehensive metabolic panel     Status: None   Collection Time: 04/06/16  5:04 PM  Result Value Ref Range   Sodium 138 135 - 145 mmol/L   Potassium 3.7 3.5 - 5.1 mmol/L   Chloride 105 101 - 111  mmol/L   CO2 25 22 - 32 mmol/L   Glucose, Bld 97 65 - 99 mg/dL   BUN 14 6 - 20 mg/dL   Creatinine, Ser 0.70 0.44 - 1.00 mg/dL   Calcium 9.3 8.9 - 10.3 mg/dL   Total Protein 7.8 6.5 - 8.1 g/dL   Albumin 4.1 3.5 - 5.0 g/dL   AST 25 15 - 41 U/L   ALT 17 14 - 54 U/L   Alkaline Phosphatase 71 38 - 126 U/L   Total Bilirubin 0.9 0.3 - 1.2 mg/dL   GFR calc non Af Amer >60 >60 mL/min   GFR calc Af Amer >60 >60 mL/min   Anion gap 8 5 - 15  CBC     Status: Abnormal   Collection Time: 04/06/16  5:04 PM  Result Value Ref Range   WBC 8.3 4.0 - 10.5 K/uL   RBC 5.30 (H) 3.87 - 5.11 MIL/uL   Hemoglobin 13.8 12.0 - 15.0 g/dL   HCT 44.1 36.0 - 46.0 %   MCV 83.2 78.0 - 100.0 fL   MCH 26.0 26.0 - 34.0 pg   MCHC 31.3 30.0 - 36.0 g/dL   RDW 15.6 (H) 11.5 - 15.5 %   Platelets 322 150 - 400 K/uL  Protime-INR     Status: None   Collection Time: 04/06/16  5:04 PM  Result Value Ref Range   Prothrombin Time 15.0 11.6 - 15.2 seconds   INR 1.20 0.00 - 1.49  Urinalysis, Routine w reflex microscopic     Status:  Abnormal   Collection Time: 04/06/16  7:28 PM  Result Value Ref Range   Color, Urine YELLOW YELLOW   APPearance CLOUDY (A) CLEAR   Specific Gravity, Urine 1.020 1.005 - 1.030   pH 7.0 5.0 - 8.0   Glucose, UA NEGATIVE NEGATIVE mg/dL   Hgb urine dipstick NEGATIVE NEGATIVE   Bilirubin Urine NEGATIVE NEGATIVE   Ketones, ur NEGATIVE NEGATIVE mg/dL   Protein, ur NEGATIVE NEGATIVE mg/dL   Nitrite NEGATIVE NEGATIVE   Leukocytes, UA SMALL (A) NEGATIVE  Urine microscopic-add on     Status: Abnormal   Collection Time: 04/06/16  7:28 PM  Result Value Ref Range   Squamous Epithelial / LPF 6-30 (A) NONE SEEN   WBC, UA 6-30 0 - 5 WBC/hpf   RBC / HPF 0-5 0 - 5 RBC/hpf   Bacteria, UA MANY (A) NONE SEEN     Ct Abdomen Pelvis W Contrast  04/06/2016  CLINICAL DATA:  Abdominal pain. Previous history of diverticulitis. Patient was referred here for CT scan by primary care physician. EXAM: CT ABDOMEN AND PELVIS WITH CONTRAST TECHNIQUE: Multidetector CT imaging of the abdomen and pelvis was performed using the standard protocol following bolus administration of intravenous contrast. CONTRAST:  123mL ISOVUE-300 IOPAMIDOL (ISOVUE-300) INJECTION 61% COMPARISON:  04/16/2013 FINDINGS: Mild dependent changes in the lung bases. Surgical absence of the gallbladder. No bile duct dilatation. Tiny sub cm low-attenuation lesions in the spleen without change since prior study. They are too small to characterize but probably represent small cysts or hemangiomas. The liver, pancreas, adrenal glands, kidneys, abdominal aorta, inferior vena cava, and retroperitoneal lymph nodes are unremarkable. Stomach, small bowel, and colon are not abnormally distended. Stool fills the colon. No free air or free fluid in the abdomen. Pelvis: Appendix is not identified. Diverticulosis of the sigmoid colon with diffuse sigmoid wall thickening likely representing muscular hypertrophy. No inflammatory changes demonstrated suggesting no evidence of  diverticulitis. No free or loculated pelvic  fluid collections. Uterus is surgically absent. Ovaries are not enlarged. No pelvic mass or lymphadenopathy. Postoperative changes with posterior fixation of L4-L5. Diffuse degenerative change throughout the lumbar spine. No destructive bone lesions. Tiny amount of subcutaneous gas in the anterior abdominal wall likely representing injection site. IMPRESSION: No acute process demonstrated in the abdomen or pelvis. Diverticulosis and muscular hypertrophy of the sigmoid colon. No discrete evidence of diverticulitis at this time. Electronically Signed   By: Lucienne Capers M.D.   On: 04/06/2016 21:55    ROS:  As stated above in the HPI otherwise negative.  There were no vitals taken for this visit.    PE: Gen: NAD, Alert and Oriented HEENT:  [place]/AT, EOMI Neck: Supple, no LAD Lungs: CTA Bilaterally CV: RRR without M/G/R ABM: Soft, NTND, +BS Ext: No C/C/E  Assessment/Plan: 1) Hematochezia - Colonoscopy. 2) ABM pain.   Aadon Gorelik D 04/07/2016, 11:13 AM

## 2016-04-08 ENCOUNTER — Encounter (HOSPITAL_COMMUNITY)
Admission: RE | Disposition: A | Discharge status: Home / Self Care | Payer: Self-pay | Source: Ambulatory Visit | Attending: Gastroenterology

## 2016-04-08 ENCOUNTER — Ambulatory Visit (HOSPITAL_COMMUNITY): Payer: Medicare Other | Admitting: Anesthesiology

## 2016-04-08 ENCOUNTER — Ambulatory Visit (HOSPITAL_COMMUNITY)
Admission: RE | Admit: 2016-04-08 | Discharge: 2016-04-08 | Disposition: A | Discharge status: Home / Self Care | Payer: Medicare Other | Source: Ambulatory Visit | Attending: Gastroenterology | Admitting: Gastroenterology

## 2016-04-08 ENCOUNTER — Encounter (HOSPITAL_COMMUNITY): Payer: Self-pay | Admitting: Anesthesiology

## 2016-04-08 DIAGNOSIS — Z79899 Other long term (current) drug therapy: Secondary | ICD-10-CM | POA: Diagnosis not present

## 2016-04-08 DIAGNOSIS — E7800 Pure hypercholesterolemia, unspecified: Secondary | ICD-10-CM | POA: Insufficient documentation

## 2016-04-08 DIAGNOSIS — K625 Hemorrhage of anus and rectum: Secondary | ICD-10-CM | POA: Diagnosis not present

## 2016-04-08 DIAGNOSIS — Z86711 Personal history of pulmonary embolism: Secondary | ICD-10-CM | POA: Diagnosis not present

## 2016-04-08 DIAGNOSIS — K635 Polyp of colon: Secondary | ICD-10-CM | POA: Diagnosis not present

## 2016-04-08 DIAGNOSIS — Z7982 Long term (current) use of aspirin: Secondary | ICD-10-CM | POA: Diagnosis not present

## 2016-04-08 DIAGNOSIS — Z6841 Body Mass Index (BMI) 40.0 and over, adult: Secondary | ICD-10-CM | POA: Insufficient documentation

## 2016-04-08 DIAGNOSIS — R109 Unspecified abdominal pain: Secondary | ICD-10-CM | POA: Insufficient documentation

## 2016-04-08 DIAGNOSIS — G4730 Sleep apnea, unspecified: Secondary | ICD-10-CM | POA: Diagnosis not present

## 2016-04-08 DIAGNOSIS — I10 Essential (primary) hypertension: Secondary | ICD-10-CM | POA: Diagnosis not present

## 2016-04-08 DIAGNOSIS — Z86010 Personal history of colonic polyps: Secondary | ICD-10-CM | POA: Diagnosis not present

## 2016-04-08 DIAGNOSIS — Z96651 Presence of right artificial knee joint: Secondary | ICD-10-CM | POA: Diagnosis not present

## 2016-04-08 DIAGNOSIS — D123 Benign neoplasm of transverse colon: Secondary | ICD-10-CM | POA: Diagnosis not present

## 2016-04-08 DIAGNOSIS — J449 Chronic obstructive pulmonary disease, unspecified: Secondary | ICD-10-CM | POA: Insufficient documentation

## 2016-04-08 DIAGNOSIS — I252 Old myocardial infarction: Secondary | ICD-10-CM | POA: Diagnosis not present

## 2016-04-08 DIAGNOSIS — K5730 Diverticulosis of large intestine without perforation or abscess without bleeding: Secondary | ICD-10-CM | POA: Insufficient documentation

## 2016-04-08 DIAGNOSIS — Z7901 Long term (current) use of anticoagulants: Secondary | ICD-10-CM | POA: Insufficient documentation

## 2016-04-08 DIAGNOSIS — Z9049 Acquired absence of other specified parts of digestive tract: Secondary | ICD-10-CM | POA: Insufficient documentation

## 2016-04-08 DIAGNOSIS — K921 Melena: Secondary | ICD-10-CM | POA: Insufficient documentation

## 2016-04-08 DIAGNOSIS — K219 Gastro-esophageal reflux disease without esophagitis: Secondary | ICD-10-CM | POA: Insufficient documentation

## 2016-04-08 HISTORY — DX: Essential (primary) hypertension: I10

## 2016-04-08 HISTORY — DX: Other cervical disc displacement, unspecified cervical region: M50.20

## 2016-04-08 HISTORY — PX: COLONOSCOPY WITH PROPOFOL: SHX5780

## 2016-04-08 HISTORY — DX: Acute myocardial infarction, unspecified: I21.9

## 2016-04-08 HISTORY — DX: Sleep apnea, unspecified: G47.30

## 2016-04-08 LAB — URINE CULTURE

## 2016-04-08 SURGERY — COLONOSCOPY WITH PROPOFOL
Anesthesia: Monitor Anesthesia Care

## 2016-04-08 MED ORDER — SODIUM CHLORIDE 0.9 % IV SOLN: INTRAVENOUS | Status: DC

## 2016-04-08 MED ORDER — PROPOFOL 10 MG/ML IV BOLUS
INTRAVENOUS | Status: AC
Start: 2016-04-08 — End: 2016-04-08
  Filled 2016-04-08: qty 40

## 2016-04-08 MED ORDER — LACTATED RINGERS IV SOLN
INTRAVENOUS | Status: DC
  Administered 2016-04-08: 09:00:00 via INTRAVENOUS
  Administered 2016-04-08: 1000 mL via INTRAVENOUS

## 2016-04-08 MED ORDER — PROPOFOL 500 MG/50ML IV EMUL
INTRAVENOUS | Status: DC | PRN
  Administered 2016-04-08: 100 ug/kg/min via INTRAVENOUS

## 2016-04-08 MED ORDER — LIDOCAINE HCL (CARDIAC) 20 MG/ML IV SOLN
INTRAVENOUS | Status: DC | PRN
  Administered 2016-04-08: 80 mg via INTRAVENOUS

## 2016-04-08 MED ORDER — PROPOFOL 500 MG/50ML IV EMUL
INTRAVENOUS | Status: DC | PRN
  Administered 2016-04-08: 30 mg via INTRAVENOUS

## 2016-04-08 SURGICAL SUPPLY — 22 items

## 2016-04-08 NOTE — Discharge Instructions (Signed)

## 2016-04-08 NOTE — Transfer of Care (Signed)
Immediate Anesthesia Transfer of Care Note  Patient: [name]  Procedure(s) Performed: Procedure(s): COLONOSCOPY WITH PROPOFOL (N/A)  Patient Location: PACU  Anesthesia Type:MAC  Level of Consciousness: sedated, patient cooperative and responds to stimulation  Airway & Oxygen Therapy: Patient Spontanous Breathing and Patient connected to face mask oxygen  Post-op Assessment: Report given to RN and Post -op Vital signs reviewed and stable  Post vital signs: Reviewed and stable  Last Vitals:  Filed Vitals:   04/08/16 0914 04/08/16 0917  BP:  149/85  Pulse: 80   Temp: 36.6 C   Resp: 17     Last Pain:  Filed Vitals:   04/08/16 0917  PainSc: 7          Complications: No apparent anesthesia complications

## 2016-04-08 NOTE — Anesthesia Preprocedure Evaluation (Addendum)
Anesthesia Evaluation  Patient identified by MRN, date of birth, ID band Patient awake    Reviewed: Allergy & Precautions, H&P , NPO status , Patient's Chart, lab work & pertinent test results, reviewed documented beta blocker date and time   Airway Mallampati: II  TM Distance: >3 FB Neck ROM: Full    Dental no notable dental hx. (+) Teeth Intact, Dental Advisory Given   Pulmonary asthma , sleep apnea , COPD,  COPD inhaler, PE   Pulmonary exam normal breath sounds clear to auscultation       Cardiovascular hypertension, Pt. on medications and Pt. on home beta blockers + Past MI   Rhythm:Regular Rate:Normal     Neuro/Psych negative neurological ROS  negative psych ROS   GI/Hepatic Neg liver ROS, GERD  Medicated and Controlled,  Endo/Other  Morbid obesity  Renal/GU negative Renal ROS  negative genitourinary   Musculoskeletal   Abdominal   Peds  Hematology negative hematology ROS (+)   Anesthesia Other Findings   Reproductive/Obstetrics negative OB ROS                           Anesthesia Physical Anesthesia Plan  ASA: III  Anesthesia Plan: MAC   Post-op Pain Management:    Induction: Intravenous  Airway Management Planned: Simple Face Mask  Additional Equipment:   Intra-op Plan:   Post-operative Plan:   Informed Consent: I have reviewed the patients History and Physical, chart, labs and discussed the procedure including the risks, benefits and alternatives for the proposed anesthesia with the patient or authorized representative who has indicated his/her understanding and acceptance.   Dental advisory given  Plan Discussed with: CRNA  Anesthesia Plan Comments:         Anesthesia Quick Evaluation

## 2016-04-08 NOTE — Op Note (Signed)
[facility] Patient Name: [name] Procedure Date: 04/08/2016 MRN: [id number] Attending MD: Carol Ada , MD Date of Birth: 09/13/1956 CSN: [id number] Age: 60 Admit Type: Outpatient Procedure:                Colonoscopy Indications:              Hematochezia Providers:                Carol Ada, MD, Cleda Daub, RN, Corliss Parish, Technician, Herbie Drape, CRNA Referring MD:              Medicines:                Propofol per Anesthesia Complications:            No immediate complications. Estimated Blood Loss:     Estimated blood loss was minimal. Procedure:                Pre-Anesthesia Assessment:                           - Prior to the procedure, a History and Physical                            was performed, and patient medications and                            allergies were reviewed. The patient's tolerance of                            previous anesthesia was also reviewed. The risks                            and benefits of the procedure and the sedation                            options and risks were discussed with the patient.                            All questions were answered, and informed consent                            was obtained. Prior Anticoagulants: The patient has                            taken Coumadin (warfarin), last dose was 5 days                            prior to procedure. ASA Grade Assessment: III - A                            patient with severe systemic disease. After  reviewing the risks and benefits, the patient was                            deemed in satisfactory condition to undergo the                            procedure.                           After obtaining informed consent, the colonoscope                            was passed under direct vision. Throughout the                            procedure, the patient's blood pressure, pulse, and                             oxygen saturations were monitored continuously. The                            [id number]) scope was introduced through                            the anus and advanced to the the cecum, identified                            by appendiceal orifice and ileocecal valve. The                            colonoscopy was performed with difficulty due to                            significant looping and a tortuous colon.                            Successful completion of the procedure was aided by                            applying abdominal pressure. The patient tolerated                            the procedure well. The quality of the bowel                            preparation was good. The ileocecal valve,                            appendiceal orifice, and rectum were photographed. Scope In: 9:37:37 AM Scope Out: 10:10:29 AM Scope Withdrawal Time: 0 hours 13 minutes 21 seconds  Total Procedure Duration: 0 hours 32 minutes 52 seconds  Findings:      A 7 mm polyp was found in the transverse colon. The polyp was sessile.       The polyp was  removed with a hot snare. Resection and retrieval were       complete.      A 4 mm polyp was found in the transverse colon. The polyp was sessile.       The polyp was removed with a cold snare. Resection and retrieval were       complete.      Scattered small and large-mouthed diverticula were found in the sigmoid       colon and descending colon.      Thick liquid stool was encountered, but this was able to be washed and       suctioned. Good to excellent views of the mucosa were obtained.       Traversing the colon was very difficult, but with abdominal pressure and       striaghtening the colonoscope cecal intubation was ultimately achieved. Impression:               - One 7 mm polyp in the transverse colon, removed                            with a hot snare. Resected and retrieved.                           -  One 4 mm polyp in the transverse colon, removed                            with a cold snare. Resected and retrieved.                           - Diverticulosis in the sigmoid colon and in the                            descending colon. Moderate Sedation:      N/A- Per Anesthesia Care Recommendation:           - Patient has a contact number available for                            emergencies. The signs and symptoms of potential                            delayed complications were discussed with the                            patient. Return to normal activities tomorrow.                            Written discharge instructions were provided to the                            patient.                           - Resume previous diet.                           - Continue present medications.                           -  Await pathology results.                           - Repeat colonoscopy in 5 years for surveillance.                           - Resume coumadin. Procedure Code(s):        --- Professional ---                           [phone], Colonoscopy, flexible; with removal of                            tumor(s), polyp(s), or other lesion(s) by snare                            technique Diagnosis Code(s):        --- Professional ---                           D12.3, Benign neoplasm of transverse colon (hepatic                            flexure or splenic flexure)                           K92.1, Melena (includes Hematochezia)                           K57.30, Diverticulosis of large intestine without                            perforation or abscess without bleeding CPT copyright 2016 American Medical Association. All rights reserved. The codes documented in this report are preliminary and upon coder review may  be revised to meet current compliance requirements. Carol Ada, MD Carol Ada, MD 04/08/2016 10:21:30 AM This report has been signed electronically. Number of Addenda:  0

## 2016-04-08 NOTE — Anesthesia Postprocedure Evaluation (Signed)
Anesthesia Post Note  Patient: [name]  Procedure(s) Performed: Procedure(s) (LRB): COLONOSCOPY WITH PROPOFOL (N/A)  Patient location during evaluation: PACU Anesthesia Type: MAC Level of consciousness: awake and alert Pain management: pain level controlled Vital Signs Assessment: post-procedure vital signs reviewed and stable Respiratory status: spontaneous breathing, nonlabored ventilation and respiratory function stable Cardiovascular status: stable and blood pressure returned to baseline Anesthetic complications: no    Last Vitals:  Filed Vitals:   04/08/16 1020 04/08/16 1030  BP: 128/65 137/72  Pulse: 87 79  Temp:    Resp: 15 13    Last Pain:  Filed Vitals:   04/08/16 1033  PainSc: 7                  Cherine Drumgoole,W. EDMOND

## 2016-04-11 ENCOUNTER — Encounter (HOSPITAL_COMMUNITY): Payer: Self-pay | Admitting: Gastroenterology

## 2016-05-06 DIAGNOSIS — G4733 Obstructive sleep apnea (adult) (pediatric): Secondary | ICD-10-CM | POA: Diagnosis not present

## 2016-05-10 DIAGNOSIS — N3000 Acute cystitis without hematuria: Secondary | ICD-10-CM | POA: Diagnosis not present

## 2016-05-10 DIAGNOSIS — N390 Urinary tract infection, site not specified: Secondary | ICD-10-CM | POA: Diagnosis not present

## 2016-06-06 DIAGNOSIS — G4733 Obstructive sleep apnea (adult) (pediatric): Secondary | ICD-10-CM | POA: Diagnosis not present

## 2016-07-07 DIAGNOSIS — G4733 Obstructive sleep apnea (adult) (pediatric): Secondary | ICD-10-CM | POA: Diagnosis not present

## 2016-07-19 DIAGNOSIS — M25521 Pain in right elbow: Secondary | ICD-10-CM | POA: Diagnosis not present

## 2016-07-19 DIAGNOSIS — M25562 Pain in left knee: Secondary | ICD-10-CM | POA: Diagnosis not present

## 2016-07-19 DIAGNOSIS — M25512 Pain in left shoulder: Secondary | ICD-10-CM | POA: Diagnosis not present

## 2016-07-22 DIAGNOSIS — N390 Urinary tract infection, site not specified: Secondary | ICD-10-CM | POA: Diagnosis not present

## 2016-07-22 DIAGNOSIS — R399 Unspecified symptoms and signs involving the genitourinary system: Secondary | ICD-10-CM | POA: Diagnosis not present

## 2016-07-29 DIAGNOSIS — Z960 Presence of urogenital implants: Secondary | ICD-10-CM | POA: Diagnosis not present

## 2016-07-29 DIAGNOSIS — N390 Urinary tract infection, site not specified: Secondary | ICD-10-CM | POA: Diagnosis not present

## 2016-07-29 DIAGNOSIS — R3915 Urgency of urination: Secondary | ICD-10-CM | POA: Diagnosis not present

## 2016-08-06 DIAGNOSIS — G4733 Obstructive sleep apnea (adult) (pediatric): Secondary | ICD-10-CM | POA: Diagnosis not present

## 2016-09-06 DIAGNOSIS — G4733 Obstructive sleep apnea (adult) (pediatric): Secondary | ICD-10-CM | POA: Diagnosis not present

## 2016-09-09 DIAGNOSIS — R3915 Urgency of urination: Secondary | ICD-10-CM | POA: Diagnosis not present

## 2016-09-09 DIAGNOSIS — N390 Urinary tract infection, site not specified: Secondary | ICD-10-CM | POA: Diagnosis not present

## 2016-09-09 DIAGNOSIS — B372 Candidiasis of skin and nail: Secondary | ICD-10-CM | POA: Diagnosis not present

## 2016-09-09 DIAGNOSIS — K5900 Constipation, unspecified: Secondary | ICD-10-CM | POA: Diagnosis not present

## 2016-09-09 DIAGNOSIS — R319 Hematuria, unspecified: Secondary | ICD-10-CM | POA: Diagnosis not present

## 2016-09-14 DIAGNOSIS — Z7901 Long term (current) use of anticoagulants: Secondary | ICD-10-CM | POA: Diagnosis not present

## 2016-09-14 DIAGNOSIS — I809 Phlebitis and thrombophlebitis of unspecified site: Secondary | ICD-10-CM | POA: Diagnosis not present

## 2016-09-14 DIAGNOSIS — I2699 Other pulmonary embolism without acute cor pulmonale: Secondary | ICD-10-CM | POA: Diagnosis not present

## 2016-10-06 DIAGNOSIS — G4733 Obstructive sleep apnea (adult) (pediatric): Secondary | ICD-10-CM | POA: Diagnosis not present

## 2016-10-14 DIAGNOSIS — I809 Phlebitis and thrombophlebitis of unspecified site: Secondary | ICD-10-CM | POA: Diagnosis not present

## 2016-10-14 DIAGNOSIS — I2699 Other pulmonary embolism without acute cor pulmonale: Secondary | ICD-10-CM | POA: Diagnosis not present

## 2016-10-14 DIAGNOSIS — Z7901 Long term (current) use of anticoagulants: Secondary | ICD-10-CM | POA: Diagnosis not present

## 2016-10-28 DIAGNOSIS — N952 Postmenopausal atrophic vaginitis: Secondary | ICD-10-CM | POA: Diagnosis not present

## 2016-10-28 DIAGNOSIS — K5900 Constipation, unspecified: Secondary | ICD-10-CM | POA: Diagnosis not present

## 2016-10-28 DIAGNOSIS — N390 Urinary tract infection, site not specified: Secondary | ICD-10-CM | POA: Diagnosis not present

## 2016-11-06 DIAGNOSIS — G4733 Obstructive sleep apnea (adult) (pediatric): Secondary | ICD-10-CM | POA: Diagnosis not present

## 2016-11-08 DIAGNOSIS — K219 Gastro-esophageal reflux disease without esophagitis: Secondary | ICD-10-CM | POA: Diagnosis not present

## 2016-11-08 DIAGNOSIS — E785 Hyperlipidemia, unspecified: Secondary | ICD-10-CM | POA: Diagnosis not present

## 2016-11-08 DIAGNOSIS — I10 Essential (primary) hypertension: Secondary | ICD-10-CM | POA: Diagnosis not present

## 2016-11-08 DIAGNOSIS — E559 Vitamin D deficiency, unspecified: Secondary | ICD-10-CM | POA: Diagnosis not present

## 2016-11-08 DIAGNOSIS — J45909 Unspecified asthma, uncomplicated: Secondary | ICD-10-CM | POA: Diagnosis not present

## 2016-11-13 DIAGNOSIS — I809 Phlebitis and thrombophlebitis of unspecified site: Secondary | ICD-10-CM | POA: Diagnosis not present

## 2016-11-13 DIAGNOSIS — Z7901 Long term (current) use of anticoagulants: Secondary | ICD-10-CM | POA: Diagnosis not present

## 2016-11-13 DIAGNOSIS — I2699 Other pulmonary embolism without acute cor pulmonale: Secondary | ICD-10-CM | POA: Diagnosis not present

## 2016-11-14 DIAGNOSIS — E785 Hyperlipidemia, unspecified: Secondary | ICD-10-CM | POA: Diagnosis not present

## 2016-11-14 DIAGNOSIS — Z0000 Encounter for general adult medical examination without abnormal findings: Secondary | ICD-10-CM | POA: Diagnosis not present

## 2016-11-14 DIAGNOSIS — I10 Essential (primary) hypertension: Secondary | ICD-10-CM | POA: Diagnosis not present

## 2016-11-14 DIAGNOSIS — I5032 Chronic diastolic (congestive) heart failure: Secondary | ICD-10-CM | POA: Diagnosis not present

## 2016-12-07 DIAGNOSIS — G4733 Obstructive sleep apnea (adult) (pediatric): Secondary | ICD-10-CM | POA: Diagnosis not present

## 2017-01-04 DIAGNOSIS — G4733 Obstructive sleep apnea (adult) (pediatric): Secondary | ICD-10-CM | POA: Diagnosis not present

## 2017-01-16 DIAGNOSIS — M19012 Primary osteoarthritis, left shoulder: Secondary | ICD-10-CM | POA: Diagnosis not present

## 2017-01-16 DIAGNOSIS — M1712 Unilateral primary osteoarthritis, left knee: Secondary | ICD-10-CM | POA: Diagnosis not present

## 2017-01-17 DIAGNOSIS — M1712 Unilateral primary osteoarthritis, left knee: Secondary | ICD-10-CM | POA: Diagnosis not present

## 2017-01-25 DIAGNOSIS — Z7901 Long term (current) use of anticoagulants: Secondary | ICD-10-CM | POA: Diagnosis not present

## 2017-01-25 DIAGNOSIS — I2699 Other pulmonary embolism without acute cor pulmonale: Secondary | ICD-10-CM | POA: Diagnosis not present

## 2017-01-25 DIAGNOSIS — I809 Phlebitis and thrombophlebitis of unspecified site: Secondary | ICD-10-CM | POA: Diagnosis not present

## 2017-01-26 DIAGNOSIS — M19012 Primary osteoarthritis, left shoulder: Secondary | ICD-10-CM | POA: Diagnosis not present

## 2017-01-27 DIAGNOSIS — N952 Postmenopausal atrophic vaginitis: Secondary | ICD-10-CM | POA: Diagnosis not present

## 2017-01-27 DIAGNOSIS — N390 Urinary tract infection, site not specified: Secondary | ICD-10-CM | POA: Diagnosis not present

## 2017-01-27 DIAGNOSIS — K5900 Constipation, unspecified: Secondary | ICD-10-CM | POA: Diagnosis not present

## 2017-02-24 DIAGNOSIS — I2699 Other pulmonary embolism without acute cor pulmonale: Secondary | ICD-10-CM | POA: Diagnosis not present

## 2017-02-24 DIAGNOSIS — Z7901 Long term (current) use of anticoagulants: Secondary | ICD-10-CM | POA: Diagnosis not present

## 2017-02-24 DIAGNOSIS — I809 Phlebitis and thrombophlebitis of unspecified site: Secondary | ICD-10-CM | POA: Diagnosis not present

## 2017-03-26 DIAGNOSIS — I2699 Other pulmonary embolism without acute cor pulmonale: Secondary | ICD-10-CM | POA: Diagnosis not present

## 2017-03-26 DIAGNOSIS — I809 Phlebitis and thrombophlebitis of unspecified site: Secondary | ICD-10-CM | POA: Diagnosis not present

## 2017-03-26 DIAGNOSIS — Z7901 Long term (current) use of anticoagulants: Secondary | ICD-10-CM | POA: Diagnosis not present

## 2017-06-02 ENCOUNTER — Emergency Department (HOSPITAL_COMMUNITY): Payer: Medicare Other

## 2017-06-02 ENCOUNTER — Observation Stay (HOSPITAL_COMMUNITY): Payer: Medicare Other

## 2017-06-02 ENCOUNTER — Encounter (HOSPITAL_COMMUNITY): Payer: Self-pay

## 2017-06-02 ENCOUNTER — Inpatient Hospital Stay (HOSPITAL_COMMUNITY)
Admission: EM | Admit: 2017-06-02 | Discharge: 2017-06-16 | DRG: 454 | Disposition: A | Discharge status: Home Health | Payer: Medicare Other | Attending: Internal Medicine | Admitting: Internal Medicine

## 2017-06-02 DIAGNOSIS — Z91013 Allergy to seafood: Secondary | ICD-10-CM

## 2017-06-02 DIAGNOSIS — J45909 Unspecified asthma, uncomplicated: Secondary | ICD-10-CM

## 2017-06-02 DIAGNOSIS — Z7901 Long term (current) use of anticoagulants: Secondary | ICD-10-CM

## 2017-06-02 DIAGNOSIS — G9600 Cerebrospinal fluid leak, unspecified: Secondary | ICD-10-CM

## 2017-06-02 DIAGNOSIS — K219 Gastro-esophageal reflux disease without esophagitis: Secondary | ICD-10-CM | POA: Diagnosis present

## 2017-06-02 DIAGNOSIS — F419 Anxiety disorder, unspecified: Secondary | ICD-10-CM | POA: Diagnosis present

## 2017-06-02 DIAGNOSIS — I252 Old myocardial infarction: Secondary | ICD-10-CM

## 2017-06-02 DIAGNOSIS — I10 Essential (primary) hypertension: Secondary | ICD-10-CM

## 2017-06-02 DIAGNOSIS — M545 Low back pain, unspecified: Secondary | ICD-10-CM | POA: Diagnosis present

## 2017-06-02 DIAGNOSIS — J449 Chronic obstructive pulmonary disease, unspecified: Secondary | ICD-10-CM | POA: Diagnosis present

## 2017-06-02 DIAGNOSIS — E7800 Pure hypercholesterolemia, unspecified: Secondary | ICD-10-CM | POA: Diagnosis present

## 2017-06-02 DIAGNOSIS — G8929 Other chronic pain: Secondary | ICD-10-CM | POA: Diagnosis present

## 2017-06-02 DIAGNOSIS — G4733 Obstructive sleep apnea (adult) (pediatric): Secondary | ICD-10-CM | POA: Diagnosis present

## 2017-06-02 DIAGNOSIS — B965 Pseudomonas (aeruginosa) (mallei) (pseudomallei) as the cause of diseases classified elsewhere: Secondary | ICD-10-CM | POA: Diagnosis not present

## 2017-06-02 DIAGNOSIS — Z792 Long term (current) use of antibiotics: Secondary | ICD-10-CM

## 2017-06-02 DIAGNOSIS — B954 Other streptococcus as the cause of diseases classified elsewhere: Secondary | ICD-10-CM | POA: Diagnosis not present

## 2017-06-02 DIAGNOSIS — J45902 Unspecified asthma with status asthmaticus: Secondary | ICD-10-CM | POA: Diagnosis not present

## 2017-06-02 DIAGNOSIS — Z79899 Other long term (current) drug therapy: Secondary | ICD-10-CM

## 2017-06-02 DIAGNOSIS — M4726 Other spondylosis with radiculopathy, lumbar region: Secondary | ICD-10-CM | POA: Diagnosis present

## 2017-06-02 DIAGNOSIS — R8271 Bacteriuria: Secondary | ICD-10-CM | POA: Diagnosis not present

## 2017-06-02 DIAGNOSIS — Z87440 Personal history of urinary (tract) infections: Secondary | ICD-10-CM

## 2017-06-02 DIAGNOSIS — M79604 Pain in right leg: Secondary | ICD-10-CM | POA: Diagnosis not present

## 2017-06-02 DIAGNOSIS — M5418 Radiculopathy, sacral and sacrococcygeal region: Secondary | ICD-10-CM | POA: Diagnosis not present

## 2017-06-02 DIAGNOSIS — Z96651 Presence of right artificial knee joint: Secondary | ICD-10-CM | POA: Diagnosis present

## 2017-06-02 DIAGNOSIS — M4807 Spinal stenosis, lumbosacral region: Secondary | ICD-10-CM | POA: Diagnosis not present

## 2017-06-02 DIAGNOSIS — M5417 Radiculopathy, lumbosacral region: Secondary | ICD-10-CM | POA: Diagnosis not present

## 2017-06-02 DIAGNOSIS — M4727 Other spondylosis with radiculopathy, lumbosacral region: Secondary | ICD-10-CM | POA: Diagnosis present

## 2017-06-02 DIAGNOSIS — I2510 Atherosclerotic heart disease of native coronary artery without angina pectoris: Secondary | ICD-10-CM | POA: Diagnosis not present

## 2017-06-02 DIAGNOSIS — N390 Urinary tract infection, site not specified: Secondary | ICD-10-CM | POA: Diagnosis present

## 2017-06-02 DIAGNOSIS — Z91040 Latex allergy status: Secondary | ICD-10-CM

## 2017-06-02 DIAGNOSIS — Z419 Encounter for procedure for purposes other than remedying health state, unspecified: Secondary | ICD-10-CM

## 2017-06-02 DIAGNOSIS — T8131XA Disruption of external operation (surgical) wound, not elsewhere classified, initial encounter: Secondary | ICD-10-CM | POA: Diagnosis not present

## 2017-06-02 DIAGNOSIS — M48061 Spinal stenosis, lumbar region without neurogenic claudication: Secondary | ICD-10-CM | POA: Diagnosis not present

## 2017-06-02 DIAGNOSIS — G43909 Migraine, unspecified, not intractable, without status migrainosus: Secondary | ICD-10-CM | POA: Diagnosis present

## 2017-06-02 DIAGNOSIS — M47816 Spondylosis without myelopathy or radiculopathy, lumbar region: Secondary | ICD-10-CM | POA: Diagnosis not present

## 2017-06-02 DIAGNOSIS — E785 Hyperlipidemia, unspecified: Secondary | ICD-10-CM | POA: Diagnosis present

## 2017-06-02 DIAGNOSIS — I2782 Chronic pulmonary embolism: Secondary | ICD-10-CM | POA: Diagnosis not present

## 2017-06-02 DIAGNOSIS — Z6841 Body Mass Index (BMI) 40.0 and over, adult: Secondary | ICD-10-CM | POA: Diagnosis not present

## 2017-06-02 DIAGNOSIS — Z86718 Personal history of other venous thrombosis and embolism: Secondary | ICD-10-CM

## 2017-06-02 DIAGNOSIS — D62 Acute posthemorrhagic anemia: Secondary | ICD-10-CM | POA: Diagnosis not present

## 2017-06-02 DIAGNOSIS — Z8701 Personal history of pneumonia (recurrent): Secondary | ICD-10-CM

## 2017-06-02 DIAGNOSIS — Z9109 Other allergy status, other than to drugs and biological substances: Secondary | ICD-10-CM

## 2017-06-02 DIAGNOSIS — M5440 Lumbago with sciatica, unspecified side: Secondary | ICD-10-CM | POA: Diagnosis not present

## 2017-06-02 DIAGNOSIS — Z90710 Acquired absence of both cervix and uterus: Secondary | ICD-10-CM

## 2017-06-02 DIAGNOSIS — M5416 Radiculopathy, lumbar region: Secondary | ICD-10-CM

## 2017-06-02 DIAGNOSIS — Z7982 Long term (current) use of aspirin: Secondary | ICD-10-CM

## 2017-06-02 DIAGNOSIS — Z8249 Family history of ischemic heart disease and other diseases of the circulatory system: Secondary | ICD-10-CM

## 2017-06-02 DIAGNOSIS — J9811 Atelectasis: Secondary | ICD-10-CM | POA: Diagnosis not present

## 2017-06-02 DIAGNOSIS — Z91048 Other nonmedicinal substance allergy status: Secondary | ICD-10-CM

## 2017-06-02 DIAGNOSIS — R0600 Dyspnea, unspecified: Secondary | ICD-10-CM

## 2017-06-02 DIAGNOSIS — Z825 Family history of asthma and other chronic lower respiratory diseases: Secondary | ICD-10-CM

## 2017-06-02 DIAGNOSIS — M5441 Lumbago with sciatica, right side: Secondary | ICD-10-CM | POA: Diagnosis not present

## 2017-06-02 DIAGNOSIS — Z515 Encounter for palliative care: Secondary | ICD-10-CM

## 2017-06-02 HISTORY — DX: Personal history of urinary (tract) infections: Z87.440

## 2017-06-02 LAB — URINALYSIS, ROUTINE W REFLEX MICROSCOPIC
BILIRUBIN URINE: NEGATIVE
GLUCOSE, UA: NEGATIVE mg/dL
HGB URINE DIPSTICK: NEGATIVE
KETONES UR: NEGATIVE mg/dL
Leukocytes, UA: NEGATIVE
Nitrite: NEGATIVE
PH: 5 (ref 5.0–8.0)
PROTEIN: NEGATIVE mg/dL
Specific Gravity, Urine: 1.024 (ref 1.005–1.030)

## 2017-06-02 LAB — BASIC METABOLIC PANEL
Anion gap: 12 (ref 5–15)
BUN: 15 mg/dL (ref 6–20)
CO2: 24 mmol/L (ref 22–32)
CREATININE: 1.01 mg/dL — AB (ref 0.44–1.00)
Calcium: 9.6 mg/dL (ref 8.9–10.3)
Chloride: 103 mmol/L (ref 101–111)
GFR calc Af Amer: >60 mL/min (ref >60)
GFR, EST NON AFRICAN AMERICAN: 59 mL/min — AB (ref >60)
Glucose, Bld: 109 mg/dL — ABNORMAL HIGH (ref 65–99)
POTASSIUM: 4 mmol/L (ref 3.5–5.1)
Sodium: 139 mmol/L (ref 135–145)

## 2017-06-02 LAB — CBC WITH DIFFERENTIAL/PLATELET
BASOS PCT: 0 %
Basophils Absolute: 0.1 10*3/uL (ref 0.0–0.1)
EOS ABS: 0.1 10*3/uL (ref 0.0–0.7)
EOS PCT: 1 %
HEMATOCRIT: 45.6 % (ref 36.0–46.0)
Hemoglobin: 14.5 g/dL (ref 12.0–15.0)
Lymphocytes Relative: 24 %
Lymphs Abs: 3.3 10*3/uL (ref 0.7–4.0)
MCH: 26.4 pg (ref 26.0–34.0)
MCHC: 31.8 g/dL (ref 30.0–36.0)
MCV: 83.1 fL (ref 78.0–100.0)
MONO ABS: 1.2 10*3/uL — AB (ref 0.1–1.0)
MONOS PCT: 8 %
NEUTROS ABS: 9.4 10*3/uL — AB (ref 1.7–7.7)
Neutrophils Relative %: 67 %
PLATELETS: 309 10*3/uL (ref 150–400)
RBC: 5.49 MIL/uL — ABNORMAL HIGH (ref 3.87–5.11)
RDW: 16.3 % — AB (ref 11.5–15.5)
WBC: 14.1 10*3/uL — ABNORMAL HIGH (ref 4.0–10.5)

## 2017-06-02 LAB — PROTIME-INR
INR: 1.9
Prothrombin Time: 22 seconds — ABNORMAL HIGH (ref 11.4–15.2)

## 2017-06-02 MED ORDER — PRAVASTATIN SODIUM 40 MG PO TABS
40.0000 mg | ORAL_TABLET | Freq: Every day | ORAL | Status: DC
  Administered 2017-06-02 – 2017-06-15 (×12): 40 mg via ORAL
  Filled 2017-06-02 (×12): qty 1

## 2017-06-02 MED ORDER — ACETAMINOPHEN 650 MG RE SUPP: 650.0000 mg | Freq: Four times a day (QID) | RECTAL | Status: DC | PRN

## 2017-06-02 MED ORDER — ALBUTEROL SULFATE (2.5 MG/3ML) 0.083% IN NEBU
2.5000 mg | INHALATION_SOLUTION | Freq: Four times a day (QID) | RESPIRATORY_TRACT | Status: DC | PRN
Start: 2017-06-02 — End: 2017-06-16
  Administered 2017-06-07 – 2017-06-08 (×2): 2.5 mg via RESPIRATORY_TRACT
  Filled 2017-06-02 (×2): qty 3

## 2017-06-02 MED ORDER — ONDANSETRON HCL 4 MG PO TABS: 4.0000 mg | ORAL_TABLET | Freq: Four times a day (QID) | ORAL | Status: DC | PRN

## 2017-06-02 MED ORDER — ASPIRIN EC 81 MG PO TBEC
81.0000 mg | DELAYED_RELEASE_TABLET | Freq: Every day | ORAL | Status: DC
  Administered 2017-06-03 – 2017-06-16 (×13): 81 mg via ORAL
  Filled 2017-06-02 (×13): qty 1

## 2017-06-02 MED ORDER — HYDROMORPHONE HCL 1 MG/ML IJ SOLN
1.0000 mg | Freq: Once | INTRAMUSCULAR | Status: AC
  Administered 2017-06-02: 1 mg via INTRAVENOUS
  Filled 2017-06-02: qty 1

## 2017-06-02 MED ORDER — BISACODYL 10 MG RE SUPP: 10.0000 mg | Freq: Every day | RECTAL | Status: DC | PRN

## 2017-06-02 MED ORDER — VITAMIN D (ERGOCALCIFEROL) 1.25 MG (50000 UNIT) PO CAPS
50000.0000 [IU] | ORAL_CAPSULE | ORAL | Status: DC
  Filled 2017-06-02 (×2): qty 1

## 2017-06-02 MED ORDER — PANTOPRAZOLE SODIUM 40 MG PO TBEC
40.0000 mg | DELAYED_RELEASE_TABLET | Freq: Every day | ORAL | Status: DC
  Administered 2017-06-02 – 2017-06-16 (×13): 40 mg via ORAL
  Filled 2017-06-02 (×13): qty 1

## 2017-06-02 MED ORDER — WARFARIN SODIUM 2.5 MG PO TABS: 2.5000 mg | ORAL_TABLET | ORAL | Status: DC

## 2017-06-02 MED ORDER — KETOROLAC TROMETHAMINE 15 MG/ML IJ SOLN
15.0000 mg | Freq: Once | INTRAMUSCULAR | Status: AC
  Administered 2017-06-02: 15 mg via INTRAVENOUS
  Filled 2017-06-02: qty 1

## 2017-06-02 MED ORDER — HYDROMORPHONE HCL 1 MG/ML IJ SOLN: 0.5000 mg | INTRAMUSCULAR | Status: DC | PRN

## 2017-06-02 MED ORDER — ACETAMINOPHEN 325 MG PO TABS
650.0000 mg | ORAL_TABLET | Freq: Four times a day (QID) | ORAL | Status: DC | PRN
  Filled 2017-06-02: qty 2

## 2017-06-02 MED ORDER — FUROSEMIDE 40 MG PO TABS: 40.0000 mg | ORAL_TABLET | Freq: Two times a day (BID) | ORAL | Status: DC | PRN

## 2017-06-02 MED ORDER — SENNOSIDES-DOCUSATE SODIUM 8.6-50 MG PO TABS: 1.0000 | ORAL_TABLET | Freq: Every evening | ORAL | Status: DC | PRN

## 2017-06-02 MED ORDER — DIAZEPAM 5 MG PO TABS
10.0000 mg | ORAL_TABLET | Freq: Three times a day (TID) | ORAL | Status: DC | PRN
Start: 2017-06-02 — End: 2017-06-16
  Administered 2017-06-03 – 2017-06-16 (×13): 10 mg via ORAL
  Filled 2017-06-02 (×14): qty 2

## 2017-06-02 MED ORDER — ONDANSETRON HCL 4 MG/2ML IJ SOLN
4.0000 mg | Freq: Once | INTRAMUSCULAR | Status: AC
  Administered 2017-06-02: 4 mg via INTRAVENOUS
  Filled 2017-06-02: qty 2

## 2017-06-02 MED ORDER — TRIMETHOPRIM 100 MG PO TABS
100.0000 mg | ORAL_TABLET | Freq: Every day | ORAL | Status: DC
  Administered 2017-06-03 – 2017-06-16 (×11): 100 mg via ORAL
  Filled 2017-06-02 (×15): qty 1

## 2017-06-02 MED ORDER — CYCLOBENZAPRINE HCL 10 MG PO TABS
10.0000 mg | ORAL_TABLET | Freq: Three times a day (TID) | ORAL | Status: DC | PRN
  Administered 2017-06-03 – 2017-06-08 (×8): 10 mg via ORAL
  Filled 2017-06-02 (×9): qty 1

## 2017-06-02 MED ORDER — GADOBENATE DIMEGLUMINE 529 MG/ML IV SOLN
20.0000 mL | Freq: Once | INTRAVENOUS | Status: AC
  Administered 2017-06-02: 20 mL via INTRAVENOUS

## 2017-06-02 MED ORDER — NITROGLYCERIN 0.4 MG SL SUBL
0.4000 mg | SUBLINGUAL_TABLET | SUBLINGUAL | Status: DC | PRN
Start: 2017-06-02 — End: 2017-06-16

## 2017-06-02 MED ORDER — LOSARTAN POTASSIUM 50 MG PO TABS
50.0000 mg | ORAL_TABLET | Freq: Every day | ORAL | Status: DC
  Administered 2017-06-03 – 2017-06-08 (×5): 50 mg via ORAL
  Filled 2017-06-02 (×5): qty 1

## 2017-06-02 MED ORDER — DIAZEPAM 5 MG PO TABS
10.0000 mg | ORAL_TABLET | Freq: Once | ORAL | Status: AC
  Administered 2017-06-02: 10 mg via ORAL
  Filled 2017-06-02: qty 2

## 2017-06-02 MED ORDER — HYDROMORPHONE HCL 1 MG/ML IJ SOLN
0.5000 mg | INTRAMUSCULAR | Status: DC | PRN
  Administered 2017-06-03 – 2017-06-05 (×3): 1 mg via INTRAVENOUS
  Administered 2017-06-05: 0.5 mg via INTRAVENOUS
  Administered 2017-06-06 – 2017-06-09 (×12): 1 mg via INTRAVENOUS
  Filled 2017-06-02 (×16): qty 1

## 2017-06-02 MED ORDER — HYDROCODONE-ACETAMINOPHEN 5-325 MG PO TABS
1.0000 | ORAL_TABLET | ORAL | Status: DC | PRN
  Administered 2017-06-02 – 2017-06-09 (×21): 2 via ORAL
  Filled 2017-06-02 (×24): qty 2

## 2017-06-02 MED ORDER — ESTRADIOL 0.1 MG/GM VA CREA
1.0000 | TOPICAL_CREAM | Freq: Every day | VAGINAL | Status: DC
  Administered 2017-06-02 – 2017-06-15 (×7): 1 via VAGINAL
  Filled 2017-06-02 (×2): qty 42.5

## 2017-06-02 MED ORDER — ONDANSETRON HCL 4 MG/2ML IJ SOLN
4.0000 mg | Freq: Four times a day (QID) | INTRAMUSCULAR | Status: DC | PRN
  Administered 2017-06-02 – 2017-06-09 (×3): 4 mg via INTRAVENOUS
  Filled 2017-06-02 (×3): qty 2

## 2017-06-02 MED ORDER — SODIUM CHLORIDE 0.9 % IV BOLUS (SEPSIS)
500.0000 mL | Freq: Once | INTRAVENOUS | Status: AC
  Administered 2017-06-02: 500 mL via INTRAVENOUS

## 2017-06-02 MED ORDER — METOPROLOL SUCCINATE ER 50 MG PO TB24
50.0000 mg | ORAL_TABLET | Freq: Every day | ORAL | Status: DC
  Administered 2017-06-03 – 2017-06-09 (×4): 50 mg via ORAL
  Filled 2017-06-02 (×7): qty 1

## 2017-06-02 MED ORDER — OXYCODONE HCL 5 MG PO TABS: 10.0000 mg | ORAL_TABLET | Freq: Three times a day (TID) | ORAL | Status: DC | PRN

## 2017-06-02 NOTE — ED Notes (Signed)
O2 placed on at 2l/mc/[place]

## 2017-06-02 NOTE — ED Notes (Addendum)
Pt returned from MRI on stretcher with tech, tolerated well, but reports nausea.

## 2017-06-02 NOTE — Progress Notes (Signed)
ANTICOAGULATION CONSULT NOTE - Initial Consult  Pharmacy Consult for coumadin Indication: history of recurrent  Allergies  Allergen Reactions  . Latex Other (See Comments)    Stings and burns me  . Other Nausea And Vomiting and Other (See Comments)    Bleach - burns esophagus, breaks me out and burns me  . Shellfish Allergy Hives and Nausea And Vomiting  . Tape Other (See Comments)    Plastic tape irritates skin     Vital Signs: Temp: 97.8 F (36.6 C) (08/10 0651) Temp Source: Oral (08/10 0651) BP: 111/75 (08/10 1600) Pulse Rate: 75 (08/10 1600)  Labs:  Recent Labs  06/02/17 0934  HGB 14.5  HCT 45.6  PLT 309  LABPROT 22.0*  INR 1.90  CREATININE 1.01*    CrCl cannot be calculated (Unknown ideal weight.).   Medical History: Past Medical History:  Diagnosis Date  . Acid reflux   . Asthma   . Chronic pain   . COPD (chronic obstructive pulmonary disease) ([place])   . Diverticulitis   . Heart murmur   . Hiatal hernia   . High cholesterol   . Hypertension   . Labral tear of shoulder    left  . Myocardial infarction ([place]) 2014  . Pneumonia 2014  . Pulmonary embolism ([place]) last 2014   x 3   . Ruptured disc, cervical    1 in neck, 1 in midback and 1 in low back, 6 bulging discs,  upper and lower back, 5 slipped discs all the way uop and down back and   . Sleep apnea    uses cpap, pt does not know settings    Medications:  Prescriptions Prior to Admission  Medication Sig Dispense Refill Last Dose  . albuterol (PROVENTIL HFA;VENTOLIN HFA) 108 (90 BASE) MCG/ACT inhaler Inhale 2 puffs into the lungs every 6 (six) hours as needed. For wheezing 2 each 0 06/02/2017 at rescue  . aspirin EC 81 MG tablet Take 81 mg by mouth daily at 12 noon.    06/01/2017 at Unknown time  . cyclobenzaprine (FLEXERIL) 10 MG tablet Take 10 mg by mouth 3 (three) times daily as needed for muscle spasms.    06/01/2017 at Unknown time  . diazepam (VALIUM) 10 MG tablet Take 10 mg by mouth every 8  (eight) hours as needed for anxiety.    06/01/2017 at Unknown time  . diclofenac sodium (VOLTAREN) 1 % GEL Apply 2 g topically 4 (four) times daily as needed (For pain in shoulder, knees, back and neck.).    06/01/2017 at Unknown time  . estradiol (ESTRACE) 0.1 MG/GM vaginal cream Place 1 Applicatorful vaginally at bedtime.   06/01/2017 at Unknown time  . furosemide (LASIX) 40 MG tablet Take 40 mg by mouth 2 (two) times daily as needed for fluid. For fluid retention.   06/01/2017 at Unknown time  . lidocaine (LIDODERM) 5 % Place 1-3 patches onto the skin daily. Remove & Discard patch within 12 hours or as directed by MD   06/01/2017 at Unknown time  . losartan (COZAAR) 50 MG tablet Take 50 mg by mouth daily at 12 noon.    06/01/2017 at Unknown time  . metoprolol succinate (TOPROL-XL) 50 MG 24 hr tablet Take 50 mg by mouth daily. Take with or immediately following a meal.    06/01/2017 at 1400  . nitroGLYCERIN (NITROSTAT) 0.4 MG SL tablet Place 0.4 mg under the tongue every 5 (five) minutes as needed for chest pain.   unk at  unk  . nystatin [facility]) powder Apply 1 g topically daily as needed (rash).   Past Week at Unknown time  . omeprazole (PRILOSEC) 40 MG capsule Take 40 mg by mouth 2 (two) times daily.    06/01/2017 at Unknown time  . Oxycodone HCl 10 MG TABS Take 10 mg by mouth 3 (three) times daily as needed (For pain.).   0 06/01/2017 at Unknown time  . pravastatin (PRAVACHOL) 40 MG tablet Take 40 mg by mouth daily at 12 noon.    06/01/2017 at Unknown time  . trimethoprim (TRIMPEX) 100 MG tablet Take 100 mg by mouth daily.   06/01/2017 at Unknown time  . Vitamin D, Ergocalciferol, (DRISDOL) 50000 units CAPS capsule Take 50,000 Units by mouth every 7 (seven) days.   Past Week at Unknown time  . warfarin (COUMADIN) 5 MG tablet Take 2.5-5 mg by mouth See admin instructions. Take one tablet (5mg ) on Monday, Wednesday, Friday and half a tablet (2.5mg ) the rest of the week.  0 06/01/2017 at 1800  . cephALEXin (KEFLEX) 500 MG  capsule Take 1 capsule (500 mg total) by mouth 4 (four) times daily. (Patient not taking: Reported on 06/02/2017) 20 capsule 0 Not Taking at Unknown time  . HYDROcodone-acetaminophen (NORCO/VICODIN) 5-325 MG tablet Take 1-2 tablets by mouth every 6 (six) hours as needed for moderate pain or severe pain. (Patient not taking: Reported on 06/02/2017) 15 tablet 0 Not Taking at Unknown time  . promethazine (PHENERGAN) 25 MG tablet Take 1 tablet (25 mg total) by mouth every 6 (six) hours as needed for nausea. (Patient not taking: Reported on 06/02/2017) 30 tablet 0 Not Taking at Unknown time   Scheduled:  . aspirin EC  81 mg Oral Q1200  . estradiol  1 Applicatorful Vaginal QHS  . [START ON 06/03/2017] losartan  50 mg Oral Q1200  . metoprolol succinate  50 mg Oral Daily  . pantoprazole  40 mg Oral Daily  . pravastatin  40 mg Oral Q1200  . trimethoprim  100 mg Oral Daily  . Vitamin D (Ergocalciferol)  50,000 Units Oral Q7 days  . [START ON 06/03/2017] warfarin  2.5-5 mg Oral See admin instructions    Assessment: 61 yo female on coumadin PTA for history of PE. She is here for back pain and neurosurgery to evaluate. Pharmacy consulted to dose coumadin pending neurosurgery clearance. -INR= 1.9  Home coumadin dose: 5mg  MWF, 2.5mg  STTS (last dose was 8/9)  Goal of Therapy:  INR 2-3 Monitor platelets by anticoagulation protocol: Yes   Plan:  -Hold coumadin tonight -Daily PT/INR -Will follow procedural plans  Hildred Laser, Pharm D 06/02/2017 5:04 PM

## 2017-06-02 NOTE — ED Notes (Signed)
Pt in MRI-- having pain-- meds given

## 2017-06-02 NOTE — ED Triage Notes (Signed)
Pt arrives to ED with lower back pain that radiates down right leg and around to abdomen. PT has hx of chronic back pain but states this is much worse than before. Family reports pt has been hypertensive and tachycardic at home and is not unable to lift right leg to walk. Family reports she can take a step with left leg but they have to physically slide her right leg forward.

## 2017-06-02 NOTE — ED Provider Notes (Signed)
Medical screening examination/treatment/procedure(s) were conducted as a shared visit with non-physician practitioner(s) and myself.  I personally evaluated the patient during the encounter.  Patient has a somewhat mixed history. It sounds like she had a spinal fusion from L4-L5 back in the 80s. She states that she's had a month's worth of worsening symptoms but then in the last 24 hours a significant decline in strength of her right leg. She also says she has some sciatica with severe pain from her right back as well. On exam she has difficulty lifting her right leg off the bed secondary to discomfort. She has normal plantar and dorsiflexion normal pulses. She says she feels paresthesias with light touch. Secondary to her significant history we'll get MRI to ensure that this weakness is not stemming from a neurologic compression. We'll treat her symptoms.   Merrily Pew, MD 06/06/17 2014

## 2017-06-02 NOTE — ED Provider Notes (Signed)
[facility] Emergency Department Provider Note  ED Clinical Impression   Acute right-sided low back pain with sciatica, sciatica laterality unspecified  History   Chief Complaint Back Pain   HPI  Patient is a 61 y.o. female with a PMH of asthma, COPD, CAD, diverticulitis, htn, MI, PE (on warfarin), and chronic back pain who presents to ED for right sided low back pain, initial onset approx 1 month ago but worse yesterday evening, describes as sharp, radiates down right posterior thigh, worse with movement of right leg, states she has felt weak in moving her right leg due to pain, has tried oxycodone and flexeril without significant relief. Denies recent trauma, fall, or known injury to area. Hx of L4-L5 fusion in 1980s. Denies fevers, chills, unexplained weight loss, dizziness, CP, SOB, cough, pleurisy, abdominal pain, n/v/d, dysuria, hematuria, bladder or bowel incontinence, saddle anesthesia, extremity numbness or tingling, or any additional concerns. Denies hx of kidney stones. No hx of IVDU.  Past Medical History:  Diagnosis Date  . Acid reflux   . Asthma   . Chronic pain   . COPD (chronic obstructive pulmonary disease) ([place])   . Diverticulitis   . Heart murmur   . Hiatal hernia   . High cholesterol   . Hypertension   . Labral tear of shoulder    left  . Myocardial infarction ([place]) 2014  . Pneumonia 2014  . Pulmonary embolism ([place]) last 2014   x 3   . Ruptured disc, cervical    1 in neck, 1 in midback and 1 in low back, 6 bulging discs,  upper and lower back, 5 slipped discs all the way uop and down back and   . Sleep apnea    uses cpap, pt does not know settings    Past Surgical History:  Procedure Laterality Date  .  bone spurs removed from feet Bilateral   . ABDOMINAL HYSTERECTOMY    . BACK SURGERY     fusion x 2 lower back, laser surgery mid and back and neck also done  . CARPAL TUNNEL RELEASE Right   . CHOLECYSTECTOMY    . COLONOSCOPY WITH PROPOFOL N/A 04/08/2016    Procedure: COLONOSCOPY WITH PROPOFOL;  Surgeon: Carol Ada, MD;  Location: [facility] ENDOSCOPY;  Service: Endoscopy;  Laterality: N/A;  . colonscopy     x 2, with polyps removed both times  . JOINT REPLACEMENT    . REPLACEMENT TOTAL KNEE Right   . TUBAL LIGATION      Current Outpatient Rx  . Order #: [id number] Class: Normal  . Order #: [id number] Class: Historical Med  . Order #: [id number] Class: Print  . Order #: [id number] Class: Historical Med  . Order #: [id number] Class: Historical Med  . Order #: [id number] Class: Historical Med  . Order #: [id number] Class: Historical Med  . Order #: [id number] Class: Historical Med  . Order #: [id number] Class: Historical Med  . Order #: [id number] Class: Print  . Order #: [id number] Class: Historical Med  . Order #: [id number] Class: Historical Med  . Order #: [id number] Class: Historical Med  . Order #: [id number] Class: Historical Med  . Order #: [id number] Class: Historical Med  . Order #: [id number] Class: Historical Med  . Order #: [id number] Class: Historical Med  . Order #: [id number] Class: Historical Med  . Order #: [id number] Class: Print  . Order #: [id number] Class: Historical Med    Allergies Latex; Other; Shellfish allergy; and Tape  Family History  Problem Relation Age of Onset  . Heart failure Mother   . COPD Mother   .  Heart failure Father   . COPD Father   . Cancer Other   . Heart failure Other     Social History Social History  Substance Use Topics  . Smoking status: Never Smoker  . Smokeless tobacco: Never Used  . Alcohol use No    Review of Systems  Constitutional: Negative for fever, chills, or unexplained weight loss. Eyes: Negative for visual changes. Cardiovascular: Negative for chest pain, palpitations, or extremity swelling. Respiratory: Negative for shortness of breath or cough. Negative for hemoptysis or pleurisy.  Gastrointestinal: Negative for abdominal pain, nausea, vomiting, diarrhea, or constipation. Last BM this AM, states normal for  her. Negative for bladder or bowel incontinence. Negative for saddle anesthesia.  Genitourinary: Negative for dysuria, urinary frequency, or hematuria. Musculoskeletal: +back pain.  Skin: Negative for rash. Neurological: Negative for headaches, dizziness, or extremity numbness/tingling.  Physical Exam   VITAL SIGNS:   ED Triage Vitals  Enc Vitals Group     BP 06/02/17 0651 130/88     Pulse Rate 06/02/17 0651 97     Resp 06/02/17 0651 18     Temp 06/02/17 0651 97.8 F (36.6 C)     Temp Source 06/02/17 0651 Oral     SpO2 06/02/17 0651 95 %     Weight --      Height --      Head Circumference --      Peak Flow --      Pain Score 06/02/17 0655 10     Pain Loc --      Pain Edu? --      Excl. in [place]? --     Constitutional: Alert and oriented. Well appearing and in no respiratory apparent distress. Eyes: PERRL, EOMI, Conjunctivae normal ENT      Head: Normocephalic and atraumatic.      Mouth/Throat: Mucous membranes are moist. Oropharynx without erythema or exudate. Normal voice, handling secretions normally.      Neck: Supple, no nuchal signs, no cervical midline tenderness, full active ROM of neck.  Cardiovascular: Normal S1 S2, regular rhythm, normal rate. Normal and symmetric distal pulses are present in all extremities. Respiratory: Breath sounds clear and equal bilaterally. No wheezes, rales, or rhonchi. Normal respiratory effort.  Gastrointestinal: Abdomen soft and nontender. No rebound or guarding. There is no CVA tenderness. Genitourinary: Deferred.  Back: +R lumbosacral paraspinous muscles ttp and midline lumbar spine over L5 region mildly ttp, +R SLR, -L SLR. Increased pain with lifting right leg off bed s/t pain. Sharp and dull sensation grossly intact to bilateral LE, dorsalis pedis and posterior tibial pulses 2+, cap refill < 2sec. No rash noted.  Musculoskeletal: Spontaneously moves all extremities, hips nontender bilaterally.       Right lower leg: No calf pain,  erythema, or generalized swelling noted.       Left lower leg: No calf pain, erythema, or generalized swelling noted.  Neurologic: Speech clear. Alert and oriented to person, place, time, and situation. No gross focal neurologic deficits are appreciated. Finger to nose intact. Negative pronator. Hand grips 5/5 bilaterally. Extremities neurovascularly intact.  Skin: Skin is warm, dry, and intact.  Psychiatric: Mood and affect are normal. Speech and behavior are normal.  Labs   Labs Reviewed  URINALYSIS, ROUTINE W REFLEX MICROSCOPIC  CBC WITH DIFFERENTIAL/PLATELET  PROTIME-INR  BASIC METABOLIC PANEL    Radiology   MR Lumbar Spine W Wo Contrast    (Results Pending)      ED Course, Assessment and Plan  Pt is a 62 y/o F, afebrile, who presents to ED for right low back pain, concern for sciatica. No acute neuro deficits on exam. Low suspicion for epidural abscess/hematoma or cauda equina, but given report of right lower leg weakness, will get MRI of lumbar spine for neurologic compression. Doubt vertebral fracture, ruptured abdominal aortic aneurysm or dissection, pyelonephritis, or ureteral calculi at this time. Will get labs for renal function, UA for infection, and MRI of lumbar spine. Plan to give analgesia or re-eval.   1:32 PM Patient at MRI  2:12 PM Patient returned from MRI, updated on results. WBC 14.1, no recent steroid use. Cr 1.01. Will continue to monitor.  2:38 PM Neurosurgery paged to discuss MRI results. Plan to admit to medicine for pain control; pt agrees with this plan.  3:03 PM Discussed with S. Wartman, PA; will admit to obs for pain control.    The patient was discussed with and seen by Dr. Dayna Barker who agrees with the evaluation, workup, and treatment plan.  Previous chart, nursing notes, and vital signs reviewed.    Pertinent labs & imaging results that were available during my care of the patient were reviewed by me and considered in my medical decision  making (see chart for details).     Ulice Bold, NP 06/06/17 1947    Merrily Pew, MD 06/06/17 2014

## 2017-06-02 NOTE — H&P (Signed)
History and Physical    [name] OMV:[id number] DOB: 1956/04/29 DOA: 06/02/2017   PCP: Thressa Sheller, MD   Patient coming from:  Home    Chief Complaint:  HPI: [name] is a 61 y.o. female with medical history Asthma, COPD, hypertension, CAD status post MI, history of PE on Coumadin, chronic back pain, presenting to the ED  with right low back pain, with initial onset about one month ago, worse last evening, described as sharp, radiating down to the right posterior thigh, worse with movement of the right leg, accompanied by some weakness. Denies saddle anesthesia The patient tried oxycodone and Flexeril without significant relief. He denies any recent trauma or false. He denies any injury to the area. Of note, he has a history of L4-L5 fusion the 1980s.The patient denies any fever, chills, or recent new infections, although she has chronic UTI, treated with chronic antibiotics. She has chronic urinary retention issues. Denies chest pain, or shortness of breath.   ED Course:  BP 121/68   Pulse 74   Temp 97.8 F (36.6 C) (Oral)   Resp 18   SpO2 96%   CMET normal  WBC 14, rest of CBC normal  EkG pending  CXR pending MRI with several areas of stenosis NS to see patient  Review of Systems:  As per HPI otherwise all other systems reviewed and are negative  Past Medical History:  Diagnosis Date  . Acid reflux   . Asthma   . Chronic pain   . COPD (chronic obstructive pulmonary disease) ([place])   . Diverticulitis   . Heart murmur   . Hiatal hernia   . High cholesterol   . Hypertension   . Labral tear of shoulder    left  . Myocardial infarction ([place]) 2014  . Pneumonia 2014  . Pulmonary embolism ([place]) last 2014   x 3   . Ruptured disc, cervical    1 in neck, 1 in midback and 1 in low back, 6 bulging discs,  upper and lower back, 5 slipped discs all the way uop and down back and   . Sleep apnea    uses cpap, pt does not know settings    Past Surgical  History:  Procedure Laterality Date  .  bone spurs removed from feet Bilateral   . ABDOMINAL HYSTERECTOMY    . BACK SURGERY     fusion x 2 lower back, laser surgery mid and back and neck also done  . CARPAL TUNNEL RELEASE Right   . CHOLECYSTECTOMY    . COLONOSCOPY WITH PROPOFOL N/A 04/08/2016   Procedure: COLONOSCOPY WITH PROPOFOL;  Surgeon: Carol Ada, MD;  Location: [facility];  Service: Endoscopy;  Laterality: N/A;  . colonscopy     x 2, with polyps removed both times  . JOINT REPLACEMENT    . REPLACEMENT TOTAL KNEE Right   . TUBAL LIGATION      Social History Social History   Social History  . Marital status: Single    Spouse name: N/A  . Number of children: N/A  . Years of education: N/A   Occupational History  . Not on file.   Social History Main Topics  . Smoking status: Never Smoker  . Smokeless tobacco: Never Used  . Alcohol use No  . Drug use: No  . Sexual activity: No   Other Topics Concern  . Not on file   Social History Narrative  . No narrative on file  Allergies  Allergen Reactions  . Latex Other (See Comments)    Stings and burns me  . Other Nausea And Vomiting and Other (See Comments)    Bleach - burns esophagus, breaks me out and burns me  . Shellfish Allergy Hives and Nausea And Vomiting  . Tape Other (See Comments)    Plastic tape irritates skin    Family History  Problem Relation Age of Onset  . Heart failure Mother   . COPD Mother   . Heart failure Father   . COPD Father   . Cancer Other   . Heart failure Other       Prior to Admission medications   Medication Sig Start Date End Date Taking? Authorizing Provider  albuterol (PROVENTIL HFA;VENTOLIN HFA) 108 (90 BASE) MCG/ACT inhaler Inhale 2 puffs into the lungs every 6 (six) hours as needed. For wheezing 02/28/13  Yes Short, Noah Delaine, MD  aspirin EC 81 MG tablet Take 81 mg by mouth daily at 12 noon.    Yes [provider]  cyclobenzaprine (FLEXERIL) 10 MG tablet  Take 10 mg by mouth 3 (three) times daily as needed for muscle spasms.    Yes [provider]  diazepam (VALIUM) 10 MG tablet Take 10 mg by mouth every 8 (eight) hours as needed for anxiety.    Yes [provider]  diclofenac sodium (VOLTAREN) 1 % GEL Apply 2 g topically 4 (four) times daily as needed (For pain in shoulder, knees, back and neck.).    Yes [provider]  estradiol (ESTRACE) 0.1 MG/GM vaginal cream Place 1 Applicatorful vaginally at bedtime.   Yes [provider]  furosemide (LASIX) 40 MG tablet Take 40 mg by mouth 2 (two) times daily as needed for fluid. For fluid retention.   Yes [provider]  lidocaine (LIDODERM) 5 % Place 1-3 patches onto the skin daily. Remove & Discard patch within 12 hours or as directed by MD   Yes [provider]  losartan (COZAAR) 50 MG tablet Take 50 mg by mouth daily at 12 noon.    Yes [provider]  metoprolol succinate (TOPROL-XL) 50 MG 24 hr tablet Take 50 mg by mouth daily. Take with or immediately following a meal.    Yes [provider]  nitroGLYCERIN (NITROSTAT) 0.4 MG SL tablet Place 0.4 mg under the tongue every 5 (five) minutes as needed for chest pain.   Yes [provider]  nystatin [facility]) powder Apply 1 g topically daily as needed (rash).   Yes [provider]  omeprazole (PRILOSEC) 40 MG capsule Take 40 mg by mouth 2 (two) times daily.    Yes [provider]  Oxycodone HCl 10 MG TABS Take 10 mg by mouth 3 (three) times daily as needed (For pain.).  03/03/16  Yes [provider]  pravastatin (PRAVACHOL) 40 MG tablet Take 40 mg by mouth daily at 12 noon.    Yes [provider]  trimethoprim (TRIMPEX) 100 MG tablet Take 100 mg by mouth daily.   Yes [provider]  Vitamin D, Ergocalciferol, (DRISDOL) 50000 units CAPS capsule Take 50,000 Units by mouth every 7 (seven) days.   Yes [provider]  warfarin  (COUMADIN) 5 MG tablet Take 2.5-5 mg by mouth See admin instructions. Take one tablet (5mg ) on Monday, Wednesday, Friday and half a tablet (2.5mg ) the rest of the week. 07/20/15  Yes [provider]  cephALEXin (KEFLEX) 500 MG capsule Take 1 capsule (500 mg  total) by mouth 4 (four) times daily. Patient not taking: Reported on 06/02/2017 04/06/16   Lajean Saver, MD  HYDROcodone-acetaminophen (NORCO/VICODIN) 5-325 MG tablet Take 1-2 tablets by mouth every 6 (six) hours as needed for moderate pain or severe pain. Patient not taking: Reported on 06/02/2017 04/06/16   Lajean Saver, MD  promethazine (PHENERGAN) 25 MG tablet Take 1 tablet (25 mg total) by mouth every 6 (six) hours as needed for nausea. Patient not taking: Reported on 06/02/2017 04/19/13   Robbie Lis, MD    Physical Exam:  Vitals:   06/02/17 1145 06/02/17 1215 06/02/17 1409 06/02/17 1415  BP: (!) 99/59 (!) 101/59 115/69 121/68  Pulse: 81 81 79 74  Resp:   18   Temp:      TempSrc:      SpO2: (!) 88% 96% 95% 96%   Constitutional: NAD, calm, ill appearing  Eyes: PERRL, lids and conjunctivae normal ENMT: Mucous membranes are moist, without exudate or lesions  Neck: normal, supple, no masses, no thyromegaly Respiratory: clear to auscultation bilaterally, no wheezing, trace crackles. Normal respiratory effort  Cardiovascular: Regular rate and rhythm, no murmurs, rubs or gallops. No extremity edema. 2+ pedal pulses. No carotid bruits.  Abdomen: Soft, obese non tender, No hepatosplenomegaly. Bowel sounds positive.  Musculoskeletal: no clubbing / cyanosis. Moves all extremities Skin: no jaundice, No lesions.  Neurologic: Sensation intact  Strength equal in all extremities, pain with movement on her right sciatic region, with lumbar tenderness to palpation  Psychiatric:   Alert and oriented x 3.     Labs on Admission: I have personally reviewed following labs and imaging studies  CBC:  Recent Labs Lab 06/02/17 0934  WBC  14.1*  NEUTROABS 9.4*  HGB 14.5  HCT 45.6  MCV 83.1  PLT 427    Basic Metabolic Panel:  Recent Labs Lab 06/02/17 0934  NA 139  K 4.0  CL 103  CO2 24  GLUCOSE 109*  BUN 15  CREATININE 1.01*  CALCIUM 9.6    GFR: CrCl cannot be calculated (Unknown ideal weight.).  Liver Function Tests: No results for input(s): AST, ALT, ALKPHOS, BILITOT, PROT, ALBUMIN in the last 168 hours. No results for input(s): LIPASE, AMYLASE in the last 168 hours. No results for input(s): AMMONIA in the last 168 hours.  Coagulation Profile:  Recent Labs Lab 06/02/17 0934  INR 1.90    Cardiac Enzymes: No results for input(s): CKTOTAL, CKMB, CKMBINDEX, TROPONINI in the last 168 hours.  BNP (last 3 results) No results for input(s): PROBNP in the last 8760 hours.  HbA1C: No results for input(s): HGBA1C in the last 72 hours.  CBG: No results for input(s): GLUCAP in the last 168 hours.  Lipid Profile: No results for input(s): CHOL, HDL, LDLCALC, TRIG, CHOLHDL, LDLDIRECT in the last 72 hours.  Thyroid Function Tests: No results for input(s): TSH, T4TOTAL, FREET4, T3FREE, THYROIDAB in the last 72 hours.  Anemia Panel: No results for input(s): VITAMINB12, FOLATE, FERRITIN, TIBC, IRON, RETICCTPCT in the last 72 hours.  Urine analysis:    Component Value Date/Time   COLORURINE YELLOW 06/02/2017 1440   APPEARANCEUR HAZY (A) 06/02/2017 1440   LABSPEC 1.024 06/02/2017 1440   PHURINE 5.0 06/02/2017 1440   GLUCOSEU NEGATIVE 06/02/2017 1440   HGBUR NEGATIVE 06/02/2017 1440   BILIRUBINUR NEGATIVE 06/02/2017 1440   KETONESUR NEGATIVE 06/02/2017 1440   PROTEINUR NEGATIVE 06/02/2017 1440   UROBILINOGEN 1.0 04/15/2013 2331   NITRITE NEGATIVE 06/02/2017 1440   LEUKOCYTESUR NEGATIVE 06/02/2017 1440  Sepsis Labs: @LABRCNTIP (procalcitonin:4,lacticidven:4) )No results found for this or any previous visit (from the past 240 hour(s)).   Radiological Exams on Admission: Mr Lumbar Spine W Wo  Contrast  Result Date: 06/02/2017 CLINICAL DATA:  Low back pain radiating into the right leg and abdomen. Symptoms are chronic but have worsened. History of prior lumbar surgery. EXAM: MRI LUMBAR SPINE WITHOUT AND WITH CONTRAST TECHNIQUE: Multiplanar and multiecho pulse sequences of the lumbar spine were obtained without and with intravenous contrast. CONTRAST:  20 ml MULTIHANCE GADOBENATE DIMEGLUMINE 529 MG/ML IV SOLN COMPARISON:  MRI lumbar spine 06/18/2012. CT abdomen and pelvis 04/06/2016. FINDINGS: Segmentation:  Standard. Alignment: Extensive artifact from L4-5 fusion hardware limits evaluation. No obvious malalignment. Vertebrae:  No fracture or worrisome lesion. Conus medullaris: Extends to the L1 level and appears normal. Paraspinal and other soft tissues: Sigmoid diverticulosis is noted. Disc levels: T10-11, T11-12 and T12-L1 are imaged in the sagittal plane only. The central canal and foramina appear open at each level. L1-2: Very shallow right paracentral protrusion without central canal or foraminal stenosis. The protrusion appears slightly more prominent than on the prior exam. L2-3: Shallow disc bulge and ligamentum flavum thickening are seen. Mild to moderate central canal stenosis appears slightly worse than on the prior examination. The foramina are open. L3-4: Largely obscured by artifact. There does appear to be loss disc space height and some degree of spinal stenosis. The foramina are completely obscured. L4-5: Status post discectomy and fusion. The level is completely obscured. L5-S1:  Completely obscured. IMPRESSION: Extensive artifact from L4-5 fusion hardware completely obscures the L4-5 and L5-S1 disc interspaces. Evaluation of L3-4 is also severely limited but there does appear to be some disc space narrowing and central canal and stenosis at this level. Mild to moderate central canal narrowing at L2-3 due to a shallow disc bulge and ligamentum flavum thickening appears slightly worse  than the prior exam. Slight increase in shallow right paracentral protrusion at L1-2 without central canal or foraminal stenosis. Electronically Signed   By: Inge Rise M.D.   On: 06/02/2017 14:14    EKG: Independently reviewed.  Assessment/Plan Active Problems:   Asthma   GERD (gastroesophageal reflux disease)   Hyperlipidemia   Chronic pulmonary embolism ([facility])   Right leg pain   Severe low back pain   Intractable back pain, right lower extremity pain minimally improved with outpatient management , in the setting of status post L4-L5 fusion in 1980, and current spinal stenosis. MRI of the lumbar spine confirms L3 L4, L2-L3 narrowing, L1 L2 shallow right but at central protrusion, without stenosis, and extensive artifact from L4-L5 fusion hardware which completely obscures the L4-L5, and L5-S1 disc interspaces. Neurosurgical evaluation is pending, appreciate their involvement. The patient has received IV pain medications at the emergency department, with some relief of her pain, currently 7 out of 10. Observation MedSurg neurosurgical evaluation Dilaudid IV 0.5 mg Q3 hours. For pain, can increase to 1 mg if not therapeutic. Oral narcotics management. Continue muscle relaxers with Flexeril Antiemetics prn patient to be kept NPO and till neurosurgery rules out the need for any procedures while here the patient will likely need neurosurgical follow-up   chronic pulmonary embolism, on chronic Coumadin INR 1.9, PTT 22 will hold her dose today, in case that the surgical procedure is warranted, otherwise to resume. In the interim, she will be receiving S CD  chronic UTI. The patient has permanent blather muscle dysfunction due to above, with frequent UTIs, on chronic Bactrim Continue  Trimpex  history of asthma, no acute exacerbation. All sites are normal in Room air Continue inhalers   Hypertension BP 121/68   Pulse 74   Controlled Continue home anti-hypertensive medications     Hyperlipidemia Continue home statins  Anxiety Continue Valium prn   GERD, no acute symptoms Continue PPI   Menopausal continuous Estrace   DVT prophylaxis:Coumadin per pharmacy starting tomorrow until clear by Neuro, if no surgery is needed Code Status:   Full     Family Communication:  Discussed with patient Disposition Plan: Expect patient to be discharged to home after condition improves Consults called:   Neurosurgery by EDP  Admission status:Tele  Obs   Sharene Butters E, PA-C [facility]   06/02/2017, 3:28 PM

## 2017-06-02 NOTE — Discharge Instructions (Addendum)

## 2017-06-03 DIAGNOSIS — M79604 Pain in right leg: Secondary | ICD-10-CM | POA: Diagnosis not present

## 2017-06-03 DIAGNOSIS — E785 Hyperlipidemia, unspecified: Secondary | ICD-10-CM

## 2017-06-03 DIAGNOSIS — I10 Essential (primary) hypertension: Secondary | ICD-10-CM

## 2017-06-03 DIAGNOSIS — M545 Low back pain: Secondary | ICD-10-CM | POA: Diagnosis not present

## 2017-06-03 DIAGNOSIS — J45902 Unspecified asthma with status asthmaticus: Secondary | ICD-10-CM | POA: Diagnosis not present

## 2017-06-03 DIAGNOSIS — J45909 Unspecified asthma, uncomplicated: Secondary | ICD-10-CM | POA: Diagnosis not present

## 2017-06-03 DIAGNOSIS — M5441 Lumbago with sciatica, right side: Secondary | ICD-10-CM

## 2017-06-03 DIAGNOSIS — K219 Gastro-esophageal reflux disease without esophagitis: Secondary | ICD-10-CM

## 2017-06-03 DIAGNOSIS — N390 Urinary tract infection, site not specified: Secondary | ICD-10-CM | POA: Diagnosis not present

## 2017-06-03 DIAGNOSIS — M5416 Radiculopathy, lumbar region: Secondary | ICD-10-CM | POA: Diagnosis not present

## 2017-06-03 DIAGNOSIS — I2782 Chronic pulmonary embolism: Secondary | ICD-10-CM | POA: Diagnosis not present

## 2017-06-03 LAB — COMPREHENSIVE METABOLIC PANEL
ALBUMIN: 3.4 g/dL — AB (ref 3.5–5.0)
ALK PHOS: 64 U/L (ref 38–126)
ALT: 20 U/L (ref 14–54)
ANION GAP: 10 (ref 5–15)
AST: 25 U/L (ref 15–41)
BUN: 12 mg/dL (ref 6–20)
CALCIUM: 8.9 mg/dL (ref 8.9–10.3)
CHLORIDE: 101 mmol/L (ref 101–111)
CO2: 27 mmol/L (ref 22–32)
CREATININE: 0.81 mg/dL (ref 0.44–1.00)
GFR calc Af Amer: >60 mL/min (ref >60)
GFR calc non Af Amer: >60 mL/min (ref >60)
GLUCOSE: 107 mg/dL — AB (ref 65–99)
Potassium: 3.9 mmol/L (ref 3.5–5.1)
SODIUM: 138 mmol/L (ref 135–145)
Total Bilirubin: 0.9 mg/dL (ref 0.3–1.2)
Total Protein: 7.2 g/dL (ref 6.5–8.1)

## 2017-06-03 LAB — CBC
HCT: 42 % (ref 36.0–46.0)
HEMOGLOBIN: 12.9 g/dL (ref 12.0–15.0)
MCH: 25.9 pg — AB (ref 26.0–34.0)
MCHC: 30.7 g/dL (ref 30.0–36.0)
MCV: 84.3 fL (ref 78.0–100.0)
Platelets: 265 10*3/uL (ref 150–400)
RBC: 4.98 MIL/uL (ref 3.87–5.11)
RDW: 16.9 % — ABNORMAL HIGH (ref 11.5–15.5)
WBC: 11 10*3/uL — ABNORMAL HIGH (ref 4.0–10.5)

## 2017-06-03 LAB — PROTIME-INR
INR: 1.75
PROTHROMBIN TIME: 20.7 s — AB (ref 11.4–15.2)

## 2017-06-03 MED ORDER — WARFARIN - PHARMACIST DOSING INPATIENT: Freq: Every day | Status: DC

## 2017-06-03 MED ORDER — WARFARIN SODIUM 4 MG PO TABS
4.0000 mg | ORAL_TABLET | Freq: Once | ORAL | Status: DC
  Filled 2017-06-03: qty 1

## 2017-06-03 NOTE — Progress Notes (Signed)
[facility] PROGRESS NOTE  [name] HER:[id number] DOB: August 10, 1956 DOA: 06/02/2017 PCP: Thressa Sheller, MD  Interim summary and HPI 61 y.o. female with medical history Asthma, COPD, hypertension, CAD status post MI, history of PE on Coumadin, chronic back pain, presenting to the ED secondary to worsening back pain, associated right leg sciatica and intermittent paresthesia. Pain is intractable and is affecting ADL's.     Assessment/Plan: 1-intractable lower back pain: due to spinal stenosis with sciatica and intermittent paresthesia. -patient continue to have pain -neurosurgery will se her today; but doubt surgical intervention will be offered (patient has been seen as an outpatient and declined for interventions). -will ask palliative care to see patient for assistance with pain management  -will also request PT evaluation.  2-chronic PE and hx of DVT -will continue coumadin per pharmacy   3-hx of chronic UTI -will continue trimpex -no dysuria or symptoms of retention   4-hx of asthma -stable and at baseline -no wheezing -will continue PRN albuterol  5-HTN -will continue home antihypertensive regimen  -BP is stable currently  6-HLD -continue statins   7-GERD -continue PPI  8-obesity -Body mass index is 50.66 kg/m. -low calorie diet has been encouraged   9-hx of anxiety -continue PRN valium   Code Status: Full Family Communication: no family at bedside  Disposition Plan: patient still in pain; still not seen by neurosurgery and in need of PT evaluation. Due to uncontrolled pain will ask palliative care to see her for assistance with pain management. Pt ordered to assess movement capability and needs for discharge.    Consultants:  Neurosurgery   Palliative care   Procedures:  See below for x-ray reports   Antibiotics:  None   HPI/Subjective: Afebrile, complaining of lower back pain, no CP, no nausea, no vomiting.  Objective: Vitals:    06/02/17 2150 06/03/17 0300  BP: 113/62 (!) 114/59  Pulse: 71 73  Resp: 18 18  Temp: 98.3 F (36.8 C) 98 F (36.7 C)  SpO2: 98% 99%    Intake/Output Summary (Last 24 hours) at 06/03/17 1205 Last data filed at 06/03/17 1050  Gross per 24 hour  Intake                0 ml  Output              400 ml  Net             -400 ml   Filed Weights   06/03/17 0300  Weight: 129.7 kg (286 lb)    Exam:   General: afebrile, no CP, no nausea, no vomiting. Patient is obese and in acute distress due to low back pain. Reports ongoing sciatica and intermittent paresthesia.  Cardiovascular: RRR, no rubs, no gallops, unable to assess JVD due to body habitus.  Respiratory: good air movement, no wheezing, no crackles.   Abdomen: soft, NT, ND, positive BS  Musculoskeletal: right leg with decrease range of motion due to pain, no cyanosis, no clubbing.  Data Reviewed: Basic Metabolic Panel:  Recent Labs Lab 06/02/17 0934 06/03/17 0407  NA 139 138  K 4.0 3.9  CL 103 101  CO2 24 27  GLUCOSE 109* 107*  BUN 15 12  CREATININE 1.01* 0.81  CALCIUM 9.6 8.9   Liver Function Tests:  Recent Labs Lab 06/03/17 0407  AST 25  ALT 20  ALKPHOS 64  BILITOT 0.9  PROT 7.2  ALBUMIN 3.4*   CBC:  Recent Labs Lab 06/02/17 0934 06/03/17 0407  WBC 14.1* 11.0*  NEUTROABS 9.4*  --   HGB 14.5 12.9  HCT 45.6 42.0  MCV 83.1 84.3  PLT 309 265   CBG: No results for input(s): GLUCAP in the last 168 hours.  Recent Results (from the past 240 hour(s))  Culture, blood (Routine X 2) w Reflex to ID Panel     Status: None (Preliminary result)   Collection Time: 06/02/17  5:22 PM  Result Value Ref Range Status   Specimen Description BLOOD LEFT ARM  Final   Special Requests   Final    BOTTLES DRAWN AEROBIC AND ANAEROBIC Blood Culture adequate volume   Culture NO GROWTH < 24 HOURS  Final   Report Status PENDING  Incomplete  Culture, blood (Routine X 2) w Reflex to ID Panel     Status: None  (Preliminary result)   Collection Time: 06/02/17  6:24 PM  Result Value Ref Range Status   Specimen Description BLOOD RIGHT ARM  Final   Special Requests IN PEDIATRIC BOTTLE Blood Culture adequate volume  Final   Culture NO GROWTH < 24 HOURS  Final   Report Status PENDING  Incomplete     Studies: Dg Chest 2 View  Result Date: 06/02/2017 CLINICAL DATA:  Asthma. EXAM: CHEST  2 VIEW COMPARISON:  03/14/2013 FINDINGS: The heart size and mediastinal contours are within normal limits. Low lung volumes with bibasilar atelectasis. There is no evidence of pulmonary edema, consolidation, pneumothorax, nodule or pleural fluid. The visualized skeletal structures are unremarkable. IMPRESSION: Bibasilar atelectasis.  No active cardiopulmonary disease. Electronically Signed   By: Aletta Edouard M.D.   On: 06/02/2017 20:12   Mr Lumbar Spine W Wo Contrast  Result Date: 06/02/2017 CLINICAL DATA:  Low back pain radiating into the right leg and abdomen. Symptoms are chronic but have worsened. History of prior lumbar surgery. EXAM: MRI LUMBAR SPINE WITHOUT AND WITH CONTRAST TECHNIQUE: Multiplanar and multiecho pulse sequences of the lumbar spine were obtained without and with intravenous contrast. CONTRAST:  20 ml MULTIHANCE GADOBENATE DIMEGLUMINE 529 MG/ML IV SOLN COMPARISON:  MRI lumbar spine 06/18/2012. CT abdomen and pelvis 04/06/2016. FINDINGS: Segmentation:  Standard. Alignment: Extensive artifact from L4-5 fusion hardware limits evaluation. No obvious malalignment. Vertebrae:  No fracture or worrisome lesion. Conus medullaris: Extends to the L1 level and appears normal. Paraspinal and other soft tissues: Sigmoid diverticulosis is noted. Disc levels: T10-11, T11-12 and T12-L1 are imaged in the sagittal plane only. The central canal and foramina appear open at each level. L1-2: Very shallow right paracentral protrusion without central canal or foraminal stenosis. The protrusion appears slightly more prominent than  on the prior exam. L2-3: Shallow disc bulge and ligamentum flavum thickening are seen. Mild to moderate central canal stenosis appears slightly worse than on the prior examination. The foramina are open. L3-4: Largely obscured by artifact. There does appear to be loss disc space height and some degree of spinal stenosis. The foramina are completely obscured. L4-5: Status post discectomy and fusion. The level is completely obscured. L5-S1:  Completely obscured. IMPRESSION: Extensive artifact from L4-5 fusion hardware completely obscures the L4-5 and L5-S1 disc interspaces. Evaluation of L3-4 is also severely limited but there does appear to be some disc space narrowing and central canal and stenosis at this level. Mild to moderate central canal narrowing at L2-3 due to a shallow disc bulge and ligamentum flavum thickening appears slightly worse than the prior exam. Slight increase in shallow right paracentral protrusion at L1-2 without central canal or foraminal stenosis.  Electronically Signed   By: Inge Rise M.D.   On: 06/02/2017 14:14    Scheduled Meds: . aspirin EC  81 mg Oral Q1200  . estradiol  1 Applicatorful Vaginal QHS  . losartan  50 mg Oral Q1200  . metoprolol succinate  50 mg Oral Daily  . pantoprazole  40 mg Oral Daily  . pravastatin  40 mg Oral Q1200  . trimethoprim  100 mg Oral Daily  . Vitamin D (Ergocalciferol)  50,000 Units Oral Q7 days   Continuous Infusions:  Active Problems:   Asthma   GERD (gastroesophageal reflux disease)   Hyperlipidemia   Chronic pulmonary embolism (HCC)   Right leg pain   Severe low back pain   Acute right-sided low back pain with sciatica    Time spent: 30 minutes    Barton Dubois  [facility] Pager [phone]. If 7PM-7AM, please contact night-coverage at [email or web address], password [facility] 06/03/2017, 12:05 PM  LOS: 0 days

## 2017-06-03 NOTE — Consult Note (Addendum)
Reason for Consult: Back and right leg pain Referring Physician: Carlos Madera M.D.  [name] is an 61 y.o. female.  HPI: [name] is a 61-year-old individual who's had significant problems with her back dating back to the 80s. In 1989 she underwent a posterior fusion at L4-L5 using the Steffee technique. She subsequently had several other surgeries and [facility] spinal surgery. He notes that this seemed to help but she is Chronic low back pain and since this past Thursday is developed severe right lumbar radicular pain. She notes is difficult for her to be in any position but supine. She feels her right leg is weak and has difficulty moving it for fear of pain. She had an MRI of the lumbar spine performed which is largely shrouded by artifact from the Steffee plates. A CT scan of the abdomen demonstrates that the patient has adjacent level spondylosis both above and below the L4-L5 level with what appears to be moderate stenosis. On the right side at L5-S1 she may have some significant bony foraminal stenosis.  Past Medical History:  Diagnosis Date  . Acid reflux   . Asthma   . Chronic pain   . COPD (chronic obstructive pulmonary disease) (HCC)   . Diverticulitis   . Heart murmur   . Hiatal hernia   . High cholesterol   . Hypertension   . Labral tear of shoulder    left  . Myocardial infarction (HCC) 2014  . Pneumonia 2014  . Pulmonary embolism (HCC) last 2014   x 3   . Ruptured disc, cervical    1 in neck, 1 in midback and 1 in low back, 6 bulging discs,  upper and lower back, 5 slipped discs all the way uop and down back and   . Sleep apnea    uses cpap, pt does not know settings    Past Surgical History:  Procedure Laterality Date  .  bone spurs removed from feet Bilateral   . ABDOMINAL HYSTERECTOMY    . BACK SURGERY     fusion x 2 lower back, laser surgery mid and back and neck also done  . CARPAL  TUNNEL RELEASE Right   . CHOLECYSTECTOMY    . COLONOSCOPY WITH PROPOFOL N/A 04/08/2016   Procedure: COLONOSCOPY WITH PROPOFOL;  Surgeon: Patrick Hung, MD;  Location: [facility];  Service: Endoscopy;  Laterality: N/A;  . colonscopy     x 2, with polyps removed both times  . JOINT REPLACEMENT    . REPLACEMENT TOTAL KNEE Right   . TUBAL LIGATION      Family History  Problem Relation Age of Onset  . Heart failure Mother   . COPD Mother   . Heart failure Father   . COPD Father   . Cancer Other   . Heart failure Other     Social History:  reports that she has never smoked. She has never used smokeless tobacco. She reports that she does not drink alcohol or use drugs.  Allergies:  Allergies  Allergen Reactions  . Latex Other (See Comments)    Stings and burns me  . Other Nausea And Vomiting and Other (See Comments)    Bleach - burns esophagus, breaks me out and burns me  . Shellfish Allergy Hives and Nausea And Vomiting  . Tape Other (See Comments)    Plastic tape irritates skin    Medications: I have reviewed the   patient's current medications.  Results for orders placed or performed during the hospital encounter of 06/02/17 (from the past 48 hour(s))  CBC with Differential     Status: Abnormal   Collection Time: 06/02/17  9:34 AM  Result Value Ref Range   WBC 14.1 (H) 4.0 - 10.5 K/uL   RBC 5.49 (H) 3.87 - 5.11 MIL/uL   Hemoglobin 14.5 12.0 - 15.0 g/dL   HCT 45.6 36.0 - 46.0 %   MCV 83.1 78.0 - 100.0 fL   MCH 26.4 26.0 - 34.0 pg   MCHC 31.8 30.0 - 36.0 g/dL   RDW 16.3 (H) 11.5 - 15.5 %   Platelets 309 150 - 400 K/uL   Neutrophils Relative % 67 %   Neutro Abs 9.4 (H) 1.7 - 7.7 K/uL   Lymphocytes Relative 24 %   Lymphs Abs 3.3 0.7 - 4.0 K/uL   Monocytes Relative 8 %   Monocytes Absolute 1.2 (H) 0.1 - 1.0 K/uL   Eosinophils Relative 1 %   Eosinophils Absolute 0.1 0.0 - 0.7 K/uL   Basophils Relative 0 %   Basophils Absolute 0.1 0.0 - 0.1 K/uL  Protime-INR     Status:  Abnormal   Collection Time: 06/02/17  9:34 AM  Result Value Ref Range   Prothrombin Time 22.0 (H) 11.4 - 15.2 seconds   INR 8.11   Basic metabolic panel     Status: Abnormal   Collection Time: 06/02/17  9:34 AM  Result Value Ref Range   Sodium 139 135 - 145 mmol/L   Potassium 4.0 3.5 - 5.1 mmol/L   Chloride 103 101 - 111 mmol/L   CO2 24 22 - 32 mmol/L   Glucose, Bld 109 (H) 65 - 99 mg/dL   BUN 15 6 - 20 mg/dL   Creatinine, Ser 1.01 (H) 0.44 - 1.00 mg/dL   Calcium 9.6 8.9 - 10.3 mg/dL   GFR calc non Af Amer 59 (L) >60 mL/min   GFR calc Af Amer >60 >60 mL/min    Comment: (NOTE) The eGFR has been calculated using the CKD EPI equation. This calculation has not been validated in all clinical situations. eGFR's persistently <60 mL/min signify possible Chronic Kidney Disease.    Anion gap 12 5 - 15  Urinalysis, Routine w reflex microscopic     Status: Abnormal   Collection Time: 06/02/17  2:40 PM  Result Value Ref Range   Color, Urine YELLOW YELLOW   APPearance HAZY (A) CLEAR   Specific Gravity, Urine 1.024 1.005 - 1.030   pH 5.0 5.0 - 8.0   Glucose, UA NEGATIVE NEGATIVE mg/dL   Hgb urine dipstick NEGATIVE NEGATIVE   Bilirubin Urine NEGATIVE NEGATIVE   Ketones, ur NEGATIVE NEGATIVE mg/dL   Protein, ur NEGATIVE NEGATIVE mg/dL   Nitrite NEGATIVE NEGATIVE   Leukocytes, UA NEGATIVE NEGATIVE  Culture, blood (Routine X 2) w Reflex to ID Panel     Status: None (Preliminary result)   Collection Time: 06/02/17  5:22 PM  Result Value Ref Range   Specimen Description BLOOD LEFT ARM    Special Requests      BOTTLES DRAWN AEROBIC AND ANAEROBIC Blood Culture adequate volume   Culture NO GROWTH < 24 HOURS    Report Status PENDING   Culture, blood (Routine X 2) w Reflex to ID Panel     Status: None (Preliminary result)   Collection Time: 06/02/17  6:24 PM  Result Value Ref Range   Specimen Description BLOOD RIGHT ARM  Special Requests IN PEDIATRIC BOTTLE Blood Culture adequate volume     Culture NO GROWTH < 24 HOURS    Report Status PENDING   Comprehensive metabolic panel     Status: Abnormal   Collection Time: 06/03/17  4:07 AM  Result Value Ref Range   Sodium 138 135 - 145 mmol/L   Potassium 3.9 3.5 - 5.1 mmol/L   Chloride 101 101 - 111 mmol/L   CO2 27 22 - 32 mmol/L   Glucose, Bld 107 (H) 65 - 99 mg/dL   BUN 12 6 - 20 mg/dL   Creatinine, Ser 0.81 0.44 - 1.00 mg/dL   Calcium 8.9 8.9 - 10.3 mg/dL   Total Protein 7.2 6.5 - 8.1 g/dL   Albumin 3.4 (L) 3.5 - 5.0 g/dL   AST 25 15 - 41 U/L   ALT 20 14 - 54 U/L   Alkaline Phosphatase 64 38 - 126 U/L   Total Bilirubin 0.9 0.3 - 1.2 mg/dL   GFR calc non Af Amer >60 >60 mL/min   GFR calc Af Amer >60 >60 mL/min    Comment: (NOTE) The eGFR has been calculated using the CKD EPI equation. This calculation has not been validated in all clinical situations. eGFR's persistently <60 mL/min signify possible Chronic Kidney Disease.    Anion gap 10 5 - 15  CBC     Status: Abnormal   Collection Time: 06/03/17  4:07 AM  Result Value Ref Range   WBC 11.0 (H) 4.0 - 10.5 K/uL   RBC 4.98 3.87 - 5.11 MIL/uL   Hemoglobin 12.9 12.0 - 15.0 g/dL   HCT 42.0 36.0 - 46.0 %   MCV 84.3 78.0 - 100.0 fL   MCH 25.9 (L) 26.0 - 34.0 pg   MCHC 30.7 30.0 - 36.0 g/dL   RDW 16.9 (H) 11.5 - 15.5 %   Platelets 265 150 - 400 K/uL  Protime-INR     Status: Abnormal   Collection Time: 06/03/17  4:07 AM  Result Value Ref Range   Prothrombin Time 20.7 (H) 11.4 - 15.2 seconds   INR 1.75     Dg Chest 2 View  Result Date: 06/02/2017 CLINICAL DATA:  Asthma. EXAM: CHEST  2 VIEW COMPARISON:  03/14/2013 FINDINGS: The heart size and mediastinal contours are within normal limits. Low lung volumes with bibasilar atelectasis. There is no evidence of pulmonary edema, consolidation, pneumothorax, nodule or pleural fluid. The visualized skeletal structures are unremarkable. IMPRESSION: Bibasilar atelectasis.  No active cardiopulmonary disease. Electronically  Signed   By: Aletta Edouard M.D.   On: 06/02/2017 20:12   Mr Lumbar Spine W Wo Contrast  Result Date: 06/02/2017 CLINICAL DATA:  Low back pain radiating into the right leg and abdomen. Symptoms are chronic but have worsened. History of prior lumbar surgery. EXAM: MRI LUMBAR SPINE WITHOUT AND WITH CONTRAST TECHNIQUE: Multiplanar and multiecho pulse sequences of the lumbar spine were obtained without and with intravenous contrast. CONTRAST:  20 ml MULTIHANCE GADOBENATE DIMEGLUMINE 529 MG/ML IV SOLN COMPARISON:  MRI lumbar spine 06/18/2012. CT abdomen and pelvis 04/06/2016. FINDINGS: Segmentation:  Standard. Alignment: Extensive artifact from L4-5 fusion hardware limits evaluation. No obvious malalignment. Vertebrae:  No fracture or worrisome lesion. Conus medullaris: Extends to the L1 level and appears normal. Paraspinal and other soft tissues: Sigmoid diverticulosis is noted. Disc levels: T10-11, T11-12 and T12-L1 are imaged in the sagittal plane only. The central canal and foramina appear open at each level. L1-2: Very shallow right paracentral protrusion without central  canal or foraminal stenosis. The protrusion appears slightly more prominent than on the prior exam. L2-3: Shallow disc bulge and ligamentum flavum thickening are seen. Mild to moderate central canal stenosis appears slightly worse than on the prior examination. The foramina are open. L3-4: Largely obscured by artifact. There does appear to be loss disc space height and some degree of spinal stenosis. The foramina are completely obscured. L4-5: Status post discectomy and fusion. The level is completely obscured. L5-S1:  Completely obscured. IMPRESSION: Extensive artifact from L4-5 fusion hardware completely obscures the L4-5 and L5-S1 disc interspaces. Evaluation of L3-4 is also severely limited but there does appear to be some disc space narrowing and central canal and stenosis at this level. Mild to moderate central canal narrowing at L2-3 due  to a shallow disc bulge and ligamentum flavum thickening appears slightly worse than the prior exam. Slight increase in shallow right paracentral protrusion at L1-2 without central canal or foraminal stenosis. Electronically Signed   By: Thomas  Dalessio M.D.   On: 06/02/2017 14:14    Review of Systems  HENT: Negative.   Eyes: Negative.   Respiratory: Negative.   Cardiovascular: Negative.   Gastrointestinal: Negative.   Genitourinary: Negative.   Musculoskeletal: Positive for back pain.  Skin: Negative.   Neurological: Positive for weakness.       Right lower extremity pain  Psychiatric/Behavioral: Negative.    Blood pressure (!) 101/59, pulse 71, temperature 97.8 F (36.6 C), temperature source Oral, resp. rate 18, weight 129.7 kg (286 lb), SpO2 97 %. Physical Exam  Constitutional: She is oriented to person, place, and time. She appears well-developed and well-nourished.  HENT:  Head: Normocephalic and atraumatic.  Eyes: Pupils are equal, round, and reactive to light. Conjunctivae and EOM are normal.  Neck: Normal range of motion. Neck supple.  GI: Soft. Bowel sounds are normal.  Musculoskeletal:  Positive straight leg raising on right at 15 negative on left is 60.  Neurological: She is alert and oriented to person, place, and time.  Mild weakness in tibialis anterior and gastroc on the right compared to the left. Patient is very reluctant to lift her right leg but has good 4+ out of 5 strength in iliopsoas and quad.  Skin: Skin is warm and dry.  Psychiatric: She has a normal mood and affect. Her behavior is normal. Judgment and thought content normal.    Assessment/Plan: The patient has been off her anticoagulation and I believe she will require a myelogram and post myelogram to better evaluate her right lower extremity pain. In the meantime also we can try to have the radiologist to an epidural injection in lower lumbar spine possibly a transforaminal injection on the right  side which may affect her right lower extremity pain at the L5-S1 level. I have spoken to Dr. Chris Mattern about this. Her INR today is 1.75. It will be rechecked in the morning and if it is about 1.5 or so he will see if he can accommodate a transforaminal block at L5-S1 on the right.  , J 06/03/2017, 3:02 PM     

## 2017-06-03 NOTE — Care Management Obs Status (Signed)
[facility] NOTIFICATION   Patient Details  Name: [name] MRN: [id number] Date of Birth: [birth year]   Medicare Observation Status Notification Given:  Yes    Ninfa Meeker, RN 06/03/2017, 10:12 AM

## 2017-06-03 NOTE — Evaluation (Signed)
Physical Therapy Evaluation Patient Details Name: [name] MRN: [id number] DOB: 09-Nov-1955 Today's Date: 06/03/2017   History of Present Illness  [name] is a 61 y.o. female with medical history Asthma, COPD, hypertension, CAD status post MI, history of PE on Coumadin, chronic back pain, presenting to the ED  with right low back pain, with initial onset about one month ago, worse last evening, described as sharp, radiating down to the right posterior thigh, worse with movement of the right leg, accompanied by some weakness  Clinical Impression  Pt functioning near baseline. Pt has been sedentary for the last 6 months due to chronic back pain but increasingly so this last week. Pt unable to perform hygiene s/p BM due to strain on back. Pt educated on adaptive equipment. Pt to benefit from RW for safe ambualtion. Acute PT to con't to follow.    Follow Up Recommendations No PT follow up;Supervision - Intermittent    Equipment Recommendations  Rolling walker with 5" wheels    Recommendations for Other Services       Precautions / Restrictions Precautions Precautions: Fall Precaution Comments: radiating R LE pain Restrictions Weight Bearing Restrictions: No (pt self WBAT with R LE due to pain)      Mobility  Bed Mobility Overal bed mobility: Needs Assistance Bed Mobility: Rolling;Sidelying to Sit Rolling: Modified independent (Device/Increase time) Sidelying to sit: Modified independent (Device/Increase time)       General bed mobility comments: relies heavily on bed rails, increased time  Transfers Overall transfer level: Needs assistance Equipment used: Rolling walker (2 wheeled) Transfers: Sit to/from Stand Sit to Stand: Supervision         General transfer comment: increased time, no physical assist needed, v/c's for safe hand placement  Ambulation/Gait Ambulation/Gait assistance: Supervision Ambulation Distance (Feet): 10 Feet (x2, to/from  bathroom) Assistive device: Rolling walker (2 wheeled) Gait Pattern/deviations: Step-to pattern;Decreased stride length;Wide base of support Gait velocity: slow Gait velocity interpretation: Below normal speed for age/gender General Gait Details: pt very dependent on UEs and RW, antalgic gait, slow  Stairs            Wheelchair Mobility    Modified Rankin (Stroke Patients Only)       Balance Overall balance assessment: Needs assistance             Standing balance comment: pt washed hands at sink and had to place elbows on sink                             Pertinent Vitals/Pain Pain Assessment: 0-10 Pain Score: 8  Pain Location: back Pain Descriptors / Indicators: Sharp;Shooting;Radiating Pain Intervention(s): Premedicated before session    [place] expects to be discharged to:: Private residence Living Arrangements:  (brother and father live with her) Available Help at Discharge: Family Type of Home: Mobile home Home Access: Ramped entrance     [place]: One level Home Equipment: Walker - standard;Cane - single point;Shower seat - built in;Hand held shower head      Prior Function Level of Independence: Needs assistance   Gait / Transfers Assistance Needed: uses cane, has been sedentary recently due to pain,  ADL's / Homemaking Assistance Needed: family has been helping with bathing and dresssing especially Lower body  Comments: pt reports she's driven to the grocery store 3 min down the road 3x in the last 6 months     Hand Dominance  Extremity/Trunk Assessment   Upper Extremity Assessment Upper Extremity Assessment: Overall WFL for tasks assessed    Lower Extremity Assessment Lower Extremity Assessment: Generalized weakness (R LE worse than the L)    Cervical / Trunk Assessment Cervical / Trunk Assessment:  (back pain)  Communication   Communication: No difficulties  Cognition Arousal/Alertness:  Awake/alert Behavior During Therapy: WFL for tasks assessed/performed Overall Cognitive Status: Within Functional Limits for tasks assessed                                        General Comments      Exercises     Assessment/Plan    PT Assessment Patient needs continued PT services  PT Problem List Decreased strength;Decreased range of motion;Decreased activity tolerance;Decreased balance;Decreased mobility;Decreased coordination       PT Treatment Interventions DME instruction;Gait training;Stair training;Functional mobility training;Therapeutic activities;Therapeutic exercise;Balance training    PT Goals (Current goals can be found in the Care Plan section)  Acute Rehab PT Goals Patient Stated Goal: stop the pain PT Goal Formulation: With patient Time For Goal Achievement: 06/10/17 Potential to Achieve Goals: Good Additional Goals Additional Goal #1: Pt to perform pericare mod I with tongs as an extention of her reach to minimize back pain.    Frequency Min 2X/week   Barriers to discharge        Co-evaluation               AM-PAC PT "6 Clicks" Daily Activity  Outcome Measure Difficulty turning over in bed (including adjusting bedclothes, sheets and blankets)?: A Little Difficulty moving from lying on back to sitting on the side of the bed? : A Little Difficulty sitting down on and standing up from a chair with arms (e.g., wheelchair, bedside commode, etc,.)?: A Little Help needed moving to and from a bed to chair (including a wheelchair)?: A Little Help needed walking in hospital room?: A Little Help needed climbing 3-5 steps with a railing? : A Lot 6 Click Score: 17    End of Session Equipment Utilized During Treatment: Gait belt Activity Tolerance: Patient tolerated treatment well Patient left: in chair;with call bell/phone within reach Nurse Communication: Mobility status PT Visit Diagnosis: Pain Pain - part of body:  (back)     Time: 1334-1400 PT Time Calculation (min) (ACUTE ONLY): 26 min   Charges:   PT Evaluation $PT Eval Moderate Complexity: 1 Mod PT Treatments $Gait Training: 8-22 mins   PT G Codes:   PT G-Codes **NOT FOR INPATIENT CLASS** Functional Assessment Tool Used: Clinical judgement Functional Limitation: Mobility: Walking and moving around Mobility: Walking and Moving Around Current Status (G8366): At least 20 percent but less than 40 percent impaired, limited or restricted Mobility: Walking and Moving Around Goal Status [phone]): At least 1 percent but less than 20 percent impaired, limited or restricted    Kittie Plater, PT, DPT Pager #: [phone] Office #: [phone]   [place] 06/03/2017, 2:51 PM

## 2017-06-03 NOTE — Progress Notes (Signed)
ANTICOAGULATION CONSULT NOTE  Pharmacy Consult:  Coumadin Indication:  History of recurrent PEs  Allergies  Allergen Reactions  . Latex Other (See Comments)    Stings and burns me  . Other Nausea And Vomiting and Other (See Comments)    Bleach - burns esophagus, breaks me out and burns me  . Shellfish Allergy Hives and Nausea And Vomiting  . Tape Other (See Comments)    Plastic tape irritates skin     Vital Signs: Temp: 98 F (36.7 C) (08/11 0300) Temp Source: Oral (08/11 0300) BP: 114/59 (08/11 0300) Pulse Rate: 73 (08/11 0300)  Labs:  Recent Labs  06/02/17 0934 06/03/17 0407  HGB 14.5 12.9  HCT 45.6 42.0  PLT 309 265  LABPROT 22.0* 20.7*  INR 1.90 1.75  CREATININE 1.01* 0.81    CrCl cannot be calculated (Unknown ideal weight.).    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  Coumadin was held yesterday for possible surgery.  Given sub-therapeutic INR and high risk for thrombosis in setting of unknown surgical plan, Pharmacy consulted to resume Coumadin.  INR is sub-therapeutic and decreased further.  No bleeding reported.  Home Coumadin dose: 5mg  MWF, 2.5mg  STTS   Goal of Therapy:  INR 2-3    Plan:  Coumadin 4mg  PO today Daily PT / INR F/U NS plans   Emrik Erhard D. Mina [name], PharmD, BCPS Pager:  [phone] 06/03/2017, 12:23 PM

## 2017-06-04 ENCOUNTER — Observation Stay (HOSPITAL_COMMUNITY): Payer: Medicare Other

## 2017-06-04 ENCOUNTER — Encounter (HOSPITAL_COMMUNITY): Payer: Self-pay | Admitting: Radiology

## 2017-06-04 DIAGNOSIS — M79604 Pain in right leg: Secondary | ICD-10-CM

## 2017-06-04 DIAGNOSIS — M5416 Radiculopathy, lumbar region: Secondary | ICD-10-CM

## 2017-06-04 DIAGNOSIS — M5418 Radiculopathy, sacral and sacrococcygeal region: Secondary | ICD-10-CM | POA: Diagnosis not present

## 2017-06-04 DIAGNOSIS — Z515 Encounter for palliative care: Secondary | ICD-10-CM

## 2017-06-04 HISTORY — PX: IR FL GUIDED LOC OF NEEDLE/CATH TIP FOR SPINAL INJECTION RT: IMG2397

## 2017-06-04 LAB — PROTIME-INR
INR: 1.62
PROTHROMBIN TIME: 19.4 s — AB (ref 11.4–15.2)

## 2017-06-04 MED ORDER — METHYLPREDNISOLONE ACETATE 80 MG/ML IJ SUSP
INTRAMUSCULAR | Status: AC
  Administered 2017-06-04: 80 mg
  Filled 2017-06-04: qty 1

## 2017-06-04 MED ORDER — SODIUM CHLORIDE 0.9 % IJ SOLN
INTRAMUSCULAR | Status: AC
  Administered 2017-06-04: 1 mL
  Filled 2017-06-04: qty 10

## 2017-06-04 MED ORDER — METHYLPREDNISOLONE ACETATE 40 MG/ML IJ SUSP
INTRAMUSCULAR | Status: AC
  Administered 2017-06-04: 40 mg
  Filled 2017-06-04: qty 1

## 2017-06-04 MED ORDER — LIDOCAINE HCL (PF) 1 % IJ SOLN
INTRAMUSCULAR | Status: AC
  Filled 2017-06-04: qty 30

## 2017-06-04 MED ORDER — LIDOCAINE HCL 1 % IJ SOLN
INTRAMUSCULAR | Status: DC | PRN
  Administered 2017-06-04: 3 mL
  Administered 2017-06-06 (×2): 5 mL

## 2017-06-04 MED ORDER — IOPAMIDOL (ISOVUE-M 200) INJECTION 41%
INTRAMUSCULAR | Status: AC
  Administered 2017-06-04: 3 mL
  Filled 2017-06-04: qty 10

## 2017-06-04 NOTE — Progress Notes (Signed)
Staffed consult with Dr. Dyann Kief, Dr. Rowe Pavy. Per Dr. Dyann Kief, ok to hold on consult until we see how she does with procedure today. Pt being followed by neurosurgery post op. Chart reviewed and staffed with bedside RN. Pt has injectable Dilaudid and PO hydrocodone/APAP ordered. RN reports pt is ambulatory and verbalized feeling better last night. Pt needs to be affiliated with pain management clinic for outpatient pain mgt going forward.  Thank you,  Romona Curls, ANP

## 2017-06-04 NOTE — Progress Notes (Signed)
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  Pt tolerated without complication.  Discharged to floor in stable condition.

## 2017-06-04 NOTE — Progress Notes (Addendum)
ANTICOAGULATION CONSULT NOTE  Pharmacy Consult:  Coumadin Indication:  History of recurrent PEs  Allergies  Allergen Reactions  . Latex Other (See Comments)    Stings and burns me  . Other Nausea And Vomiting and Other (See Comments)    Bleach - burns esophagus, breaks me out and burns me  . Shellfish Allergy Hives and Nausea And Vomiting  . Tape Other (See Comments)    Plastic tape irritates skin     Vital Signs: Temp: 97.7 F (36.5 C) (08/11 2100) Temp Source: Oral (08/11 2100) BP: 90/57 (08/11 2100) Pulse Rate: 72 (08/11 2100)  Labs:  Recent Labs  06/02/17 0934 06/03/17 0407 06/04/17 0510  HGB 14.5 12.9  --   HCT 45.6 42.0  --   PLT 309 265  --   LABPROT 22.0* 20.7* 19.4*  INR 1.90 1.75 1.62  CREATININE 1.01* 0.81  --     CrCl cannot be calculated (Unknown ideal weight.).    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  Coumadin was held yesterday for possible surgery.  Given sub-therapeutic INR and high risk for thrombosis in setting of unknown surgical plan, Pharmacy consulted to resume Coumadin 06/03/17.    Coumadin dose was not given on 06/03/17 and INR reduced to 1.62 today.  Neurosugery plans to proceed with epidural injection and myelogram.   Home Coumadin dose: 5mg  MWF, 2.5mg  STTS   Goal of Therapy:  INR 2-3    Plan:  Continue to hold Coumadin.  Pharmacy will sign off and follow peripherally, please reconsult when ready to resume Coumadin or bridge with IV heparin. Daily PT / INR F/U reduce Toprol dose   Deren Degrazia D. Mina [name], PharmD, BCPS Pager:  [phone] 06/04/2017, 7:36 AM

## 2017-06-04 NOTE — Progress Notes (Signed)
[facility] PROGRESS NOTE  [name] QQV:[id number] DOB: 07/24/1956 DOA: 06/02/2017 PCP: Thressa Sheller, MD  Interim summary and HPI 61 y.o. female with medical history Asthma, COPD, hypertension, CAD status post MI, history of PE on Coumadin, chronic back pain, presenting to the ED secondary to worsening back pain, associated right leg sciatica and intermittent paresthesia. Pain is intractable and is affecting ADL's.     Assessment/Plan: 1-intractable lower back pain: due to spinal stenosis with sciatica and intermittent paresthesia. -patient coninue to have pain, but much improved -s/p transforaminal block -neurosurgery will reassess her and decide on need for myelogram and further intervention. -will follow further recommendations and continue PRN analgesics  2-chronic PE and hx of DVT -will continue coumadin per pharmacy once clear by neurosurgery   3-hx of chronic UTI -stable -will continue trimpex -no dysuria or symptoms of retention   4-hx of asthma -stable and at baseline -no wheezing -will continue PRN albuterol  5-HTN -will continue home antihypertensive regimen  -BP is stable and well controlled  6-HLD -continue statins   7-GERD -continue PPI  8-obesity -Body mass index is 50.66 kg/m. -low calorie diet has been encouraged   9-hx of anxiety -continue PRN valium   Code Status: Full Family Communication: no family at bedside  Disposition Plan: appreciate IR and neurosurgery for evaluation adn rec's.   Consultants:  Neurosurgery   Palliative care   Procedures:  See below for x-ray reports   transforaminal L5-S1  Antibiotics:  None   HPI/Subjective: Afebrile, feeling better today after some pain meds and transformational block at L5-S1.  Objective: Vitals:   06/03/17 2100 06/04/17 1319  BP: (!) 90/57 112/69  Pulse: 72 72  Resp: 18 18  Temp: 97.7 F (36.5 C) 98 F (36.7 C)  SpO2: 97% 95%    Intake/Output Summary  (Last 24 hours) at 06/04/17 1535 Last data filed at 06/04/17 3329  Gross per 24 hour  Intake              240 ml  Output                0 ml  Net              240 ml   Filed Weights   06/03/17 0300  Weight: 129.7 kg (286 lb)    Exam:   General: afebrile, feeling better and reporting significant improvement in her lower back pain and right sciatica.  Cardiovascular: RRR, no rubs, no gallops  Respiratory:good air movement, no wheezing, no crackles.   Abdomen: soft, NT, ND, positive BS  Musculoskeletal: improvement in her RLE sciatica; still with some mild lumbar pain (depending movements)  Data Reviewed: Basic Metabolic Panel:  Recent Labs Lab 06/02/17 0934 06/03/17 0407  NA 139 138  K 4.0 3.9  CL 103 101  CO2 24 27  GLUCOSE 109* 107*  BUN 15 12  CREATININE 1.01* 0.81  CALCIUM 9.6 8.9   Liver Function Tests:  Recent Labs Lab 06/03/17 0407  AST 25  ALT 20  ALKPHOS 64  BILITOT 0.9  PROT 7.2  ALBUMIN 3.4*   CBC:  Recent Labs Lab 06/02/17 0934 06/03/17 0407  WBC 14.1* 11.0*  NEUTROABS 9.4*  --   HGB 14.5 12.9  HCT 45.6 42.0  MCV 83.1 84.3  PLT 309 265   CBG: No results for input(s): GLUCAP in the last 168 hours.  Recent Results (from the past 240 hour(s))  Culture, Urine     Status: Abnormal (  Preliminary result)   Collection Time: 06/02/17  1:00 AM  Result Value Ref Range Status   Specimen Description URINE, CATHETERIZED  Final   Special Requests NONE  Final   Culture (A)  Final    70,000 COLONIES/mL PSEUDOMONAS AERUGINOSA SUSCEPTIBILITIES TO FOLLOW    Report Status PENDING  Incomplete  Culture, blood (Routine X 2) w Reflex to ID Panel     Status: None (Preliminary result)   Collection Time: 06/02/17  5:22 PM  Result Value Ref Range Status   Specimen Description BLOOD LEFT ARM  Final   Special Requests   Final    BOTTLES DRAWN AEROBIC AND ANAEROBIC Blood Culture adequate volume   Culture NO GROWTH 2 DAYS  Final   Report Status PENDING   Incomplete  Culture, blood (Routine X 2) w Reflex to ID Panel     Status: None (Preliminary result)   Collection Time: 06/02/17  6:24 PM  Result Value Ref Range Status   Specimen Description BLOOD RIGHT ARM  Final   Special Requests IN PEDIATRIC BOTTLE Blood Culture adequate volume  Final   Culture NO GROWTH 2 DAYS  Final   Report Status PENDING  Incomplete     Studies: Dg Chest 2 View  Result Date: 06/02/2017 CLINICAL DATA:  Asthma. EXAM: CHEST  2 VIEW COMPARISON:  03/14/2013 FINDINGS: The heart size and mediastinal contours are within normal limits. Low lung volumes with bibasilar atelectasis. There is no evidence of pulmonary edema, consolidation, pneumothorax, nodule or pleural fluid. The visualized skeletal structures are unremarkable. IMPRESSION: Bibasilar atelectasis.  No active cardiopulmonary disease. Electronically Signed   By: Aletta Edouard M.D.   On: 06/02/2017 20:12   Ir Fl Guided Loc Of Needle/cath Tip For Spinal Inject Rt  Result Date: 06/04/2017 CLINICAL DATA:  Right lower extremity radiculopathy. Right S1 radiculopathy. Calcified L5-S1 disc. FLUOROSCOPY TIME:  Radiation Exposure Index (as provided by the fluoroscopic device): 17 uGy*m2 Fluoroscopy Time:  43 seconds Number of Acquired Images:  0 PROCEDURE: Right S1 NERVE ROOT BLOCK WITH TRANSFORAMINAL EPIDURAL STEROID INJECTION Overlying skin prepped with Betadine, draped in the usual sterile fashion, and infiltrated locally with 1% Lidocaine. Curved 22 gauge spinal needle advanced into the caudal epidural space via the right S1 sacral foramen. Diagnostic injection of 1 ml Isovue-M 200 demonstrates good epidural spread partially outlining the right S1 nerve root. No intravascular uptake. 120 mg Depo-Medrol and 1.5 ml 1% Lidocaine were then administered. No immediate complication. IMPRESSION: Technically successful right S1 selective nerve root block with transforaminal epidural steroid injection. Electronically Signed   By:  San Morelle M.D.   On: 06/04/2017 10:31    Scheduled Meds: . aspirin EC  81 mg Oral Q1200  . estradiol  1 Applicatorful Vaginal QHS  . lidocaine (PF)      . losartan  50 mg Oral Q1200  . metoprolol succinate  50 mg Oral Daily  . pantoprazole  40 mg Oral Daily  . pravastatin  40 mg Oral Q1200  . trimethoprim  100 mg Oral Daily  . Vitamin D (Ergocalciferol)  50,000 Units Oral Q7 days   Continuous Infusions:  Active Problems:   Asthma   GERD (gastroesophageal reflux disease)   Hyperlipidemia   Chronic pulmonary embolism (HCC)   Right leg pain   Severe low back pain   Acute right-sided low back pain with sciatica   Palliative care by specialist    Time spent: 25 minutes    Barton Dubois  [facility] Pager [phone].  If 7PM-7AM, please contact night-coverage at [email or web address], password [email or web address] 06/04/2017, 3:35 PM  LOS: 0 days

## 2017-06-04 NOTE — Progress Notes (Signed)
Patient states she was hoping that the epidural would have been more helpful. Initially post -ilnjection she states that she could feel relief. Approx 3 hr lafterward back in her room she states that she feels some better but that she is still feeling the pain. Medicated at pt request with 2 hydrocodones as ordered

## 2017-06-04 NOTE — Progress Notes (Signed)
Patient ID: [name], female   DOB: 02-12-1956, 61 y.o.   MRN: [id number] [place].6 today. Discussed situation with Dr. Jobe Igo. I believe a Transforaminal block at L5S1 would be safe with this INR.Chance of any bleeding in the canal is negligible. I will monitor the patient post op.

## 2017-06-05 DIAGNOSIS — M4726 Other spondylosis with radiculopathy, lumbar region: Secondary | ICD-10-CM | POA: Diagnosis present

## 2017-06-05 DIAGNOSIS — I10 Essential (primary) hypertension: Secondary | ICD-10-CM | POA: Diagnosis not present

## 2017-06-05 DIAGNOSIS — R8271 Bacteriuria: Secondary | ICD-10-CM | POA: Diagnosis present

## 2017-06-05 DIAGNOSIS — B965 Pseudomonas (aeruginosa) (mallei) (pseudomallei) as the cause of diseases classified elsewhere: Secondary | ICD-10-CM | POA: Diagnosis present

## 2017-06-05 DIAGNOSIS — B954 Other streptococcus as the cause of diseases classified elsewhere: Secondary | ICD-10-CM | POA: Diagnosis present

## 2017-06-05 DIAGNOSIS — Z6841 Body Mass Index (BMI) 40.0 and over, adult: Secondary | ICD-10-CM | POA: Diagnosis not present

## 2017-06-05 DIAGNOSIS — G43909 Migraine, unspecified, not intractable, without status migrainosus: Secondary | ICD-10-CM | POA: Diagnosis present

## 2017-06-05 DIAGNOSIS — M5417 Radiculopathy, lumbosacral region: Secondary | ICD-10-CM | POA: Diagnosis not present

## 2017-06-05 DIAGNOSIS — M5416 Radiculopathy, lumbar region: Secondary | ICD-10-CM | POA: Diagnosis present

## 2017-06-05 DIAGNOSIS — M47816 Spondylosis without myelopathy or radiculopathy, lumbar region: Secondary | ICD-10-CM | POA: Diagnosis not present

## 2017-06-05 DIAGNOSIS — M5441 Lumbago with sciatica, right side: Secondary | ICD-10-CM | POA: Diagnosis not present

## 2017-06-05 DIAGNOSIS — Z472 Encounter for removal of internal fixation device: Secondary | ICD-10-CM | POA: Diagnosis not present

## 2017-06-05 DIAGNOSIS — M179 Osteoarthritis of knee, unspecified: Secondary | ICD-10-CM | POA: Diagnosis not present

## 2017-06-05 DIAGNOSIS — R079 Chest pain, unspecified: Secondary | ICD-10-CM | POA: Diagnosis not present

## 2017-06-05 DIAGNOSIS — Z981 Arthrodesis status: Secondary | ICD-10-CM | POA: Diagnosis not present

## 2017-06-05 DIAGNOSIS — M4326 Fusion of spine, lumbar region: Secondary | ICD-10-CM | POA: Diagnosis not present

## 2017-06-05 DIAGNOSIS — N390 Urinary tract infection, site not specified: Secondary | ICD-10-CM | POA: Diagnosis not present

## 2017-06-05 DIAGNOSIS — G8929 Other chronic pain: Secondary | ICD-10-CM | POA: Diagnosis present

## 2017-06-05 DIAGNOSIS — I2510 Atherosclerotic heart disease of native coronary artery without angina pectoris: Secondary | ICD-10-CM | POA: Diagnosis present

## 2017-06-05 DIAGNOSIS — F419 Anxiety disorder, unspecified: Secondary | ICD-10-CM | POA: Diagnosis present

## 2017-06-05 DIAGNOSIS — J449 Chronic obstructive pulmonary disease, unspecified: Secondary | ICD-10-CM | POA: Diagnosis not present

## 2017-06-05 DIAGNOSIS — J45909 Unspecified asthma, uncomplicated: Secondary | ICD-10-CM | POA: Diagnosis not present

## 2017-06-05 DIAGNOSIS — M48061 Spinal stenosis, lumbar region without neurogenic claudication: Secondary | ICD-10-CM | POA: Diagnosis not present

## 2017-06-05 DIAGNOSIS — D62 Acute posthemorrhagic anemia: Secondary | ICD-10-CM | POA: Diagnosis not present

## 2017-06-05 DIAGNOSIS — G960 Cerebrospinal fluid leak: Secondary | ICD-10-CM | POA: Diagnosis not present

## 2017-06-05 DIAGNOSIS — T8131XA Disruption of external operation (surgical) wound, not elsewhere classified, initial encounter: Secondary | ICD-10-CM | POA: Diagnosis not present

## 2017-06-05 DIAGNOSIS — G4733 Obstructive sleep apnea (adult) (pediatric): Secondary | ICD-10-CM | POA: Diagnosis present

## 2017-06-05 DIAGNOSIS — K219 Gastro-esophageal reflux disease without esophagitis: Secondary | ICD-10-CM | POA: Diagnosis not present

## 2017-06-05 DIAGNOSIS — M5116 Intervertebral disc disorders with radiculopathy, lumbar region: Secondary | ICD-10-CM | POA: Diagnosis not present

## 2017-06-05 DIAGNOSIS — M4727 Other spondylosis with radiculopathy, lumbosacral region: Secondary | ICD-10-CM | POA: Diagnosis not present

## 2017-06-05 DIAGNOSIS — Z96651 Presence of right artificial knee joint: Secondary | ICD-10-CM | POA: Diagnosis present

## 2017-06-05 DIAGNOSIS — M545 Low back pain: Secondary | ICD-10-CM

## 2017-06-05 DIAGNOSIS — E785 Hyperlipidemia, unspecified: Secondary | ICD-10-CM | POA: Diagnosis not present

## 2017-06-05 DIAGNOSIS — R0602 Shortness of breath: Secondary | ICD-10-CM | POA: Diagnosis not present

## 2017-06-05 DIAGNOSIS — I2782 Chronic pulmonary embolism: Secondary | ICD-10-CM | POA: Diagnosis not present

## 2017-06-05 DIAGNOSIS — M47897 Other spondylosis, lumbosacral region: Secondary | ICD-10-CM | POA: Diagnosis not present

## 2017-06-05 DIAGNOSIS — E7800 Pure hypercholesterolemia, unspecified: Secondary | ICD-10-CM | POA: Diagnosis present

## 2017-06-05 DIAGNOSIS — M4807 Spinal stenosis, lumbosacral region: Secondary | ICD-10-CM | POA: Diagnosis not present

## 2017-06-05 LAB — URINE CULTURE

## 2017-06-05 NOTE — Progress Notes (Signed)
Physical Therapy Treatment Patient Details Name: [name] MRN: [id number] DOB: [birth year] Today's Date: 06/05/2017    History of Present Illness [name] is a 61 y.o. female with medical history Asthma, COPD, hypertension, CAD status post MI, history of PE on Coumadin, chronic back pain, admitted with right low back pain and L4-S1 stenosis s/p steroid injection S1 06/04/17.     PT Comments    Upon arrival pt requests pain medication prior to mobilizing- RN notified and given dilaudid. Pt requires no physical assist for mobility; however pt presents with very slow gait and is unable to ambulate far distances. Pt requires VCs for education on safe practices for mobilizing and positioning to protect the back and decrease pain. At the end of treatment, pt insisted upon leaning back in recliner chair with feet up, despite education on best positioning. Pt states she normally sleeps in that position at home. Will continue to follow for increased mobilization, strengthening, education for back protection, as well as pain management. Pt reports that PTA she was not ambulating far and seems to be at baseline function besides increased pain.    Follow Up Recommendations  No PT follow up;Supervision - Intermittent     Equipment Recommendations  Rolling walker with 5" wheels    Recommendations for Other Services       Precautions / Restrictions Precautions Precautions: Fall Precaution Comments: Educated on back precautions due to pain. Given handout.  Restrictions Weight Bearing Restrictions: No    Mobility  Bed Mobility Overal bed mobility: Needs Assistance Bed Mobility: Rolling;Sidelying to Sit Rolling: Modified independent (Device/Increase time) Sidelying to sit: Modified independent (Device/Increase time);HOB elevated       General bed mobility comments: relies heavily on bed rails, increased time  Transfers Overall transfer level: Needs assistance   Transfers:  Sit to/from Stand Sit to Stand: Supervision         General transfer comment: Supervision for safety and VCs for hand and foot placement   Ambulation/Gait Ambulation/Gait assistance: Supervision Ambulation Distance (Feet): 40 Feet Assistive device: Rolling walker (2 wheeled) Gait Pattern/deviations: Step-to pattern;Decreased stride length;Wide base of support;Trunk flexed Gait velocity: slow Gait velocity interpretation: Below normal speed for age/gender General Gait Details: Supervision for safety and VCs for posture, increased stride length and heel-toe progression. Pt very dependent on UEs and RW, antalgic gait, slow   Stairs            Wheelchair Mobility    Modified Rankin (Stroke Patients Only)       Balance Overall balance assessment: Needs assistance Sitting-balance support: No upper extremity supported;Feet unsupported Sitting balance-Leahy Scale: Good     Standing balance support: Bilateral upper extremity supported;During functional activity Standing balance-Leahy Scale: Poor Standing balance comment: Pt relies on RW for UE support                             Cognition Arousal/Alertness: Awake/alert Behavior During Therapy: WFL for tasks assessed/performed Overall Cognitive Status: Within Functional Limits for tasks assessed                                        Exercises      General Comments        Pertinent Vitals/Pain Pain Assessment: 0-10 Pain Score: 7  Pain Location: back Pain Descriptors / Indicators: Sharp;Shooting;Radiating Pain Intervention(s): Limited activity  within patient's tolerance;Patient requesting pain meds-RN notified;Premedicated before session;Monitored during session;Repositioned    Home Living                      Prior Function            PT Goals (current goals can now be found in the care plan section) Acute Rehab PT Goals Patient Stated Goal: decrease pain  PT Goal  Formulation: With patient Time For Goal Achievement: 06/19/17 Potential to Achieve Goals: Good Progress towards PT goals: Progressing toward goals    Frequency    Min 2X/week      PT Plan Current plan remains appropriate    Co-evaluation              AM-PAC PT "6 Clicks" Daily Activity  Outcome Measure  Difficulty turning over in bed (including adjusting bedclothes, sheets and blankets)?: Total Difficulty moving from lying on back to sitting on the side of the bed? : Total Difficulty sitting down on and standing up from a chair with arms (e.g., wheelchair, bedside commode, etc,.)?: A Little Help needed moving to and from a bed to chair (including a wheelchair)?: A Little Help needed walking in hospital room?: A Little Help needed climbing 3-5 steps with a railing? : A Lot 6 Click Score: 13    End of Session Equipment Utilized During Treatment: Gait belt Activity Tolerance: Patient limited by pain Patient left: in chair;with call bell/phone within reach Nurse Communication: Patient requests pain meds PT Visit Diagnosis: Pain;Other symptoms and signs involving the nervous system (R29.898);Other abnormalities of gait and mobility (R26.89);Difficulty in walking, not elsewhere classified (R26.2);Muscle weakness (generalized) (M62.81) Pain - part of body:  (back)     Time: 1324-4010 PT Time Calculation (min) (ACUTE ONLY): 37 min  Charges:  $Gait Training: 8-22 mins $Therapeutic Activity: 8-22 mins                    G Codes:       Elberta Leatherwood, SPT Acute Rehab [place] 06/05/2017, 1:15 PM

## 2017-06-05 NOTE — Progress Notes (Signed)
[facility] PROGRESS NOTE  [name] YKD:[id number] DOB: December 22, 1955 DOA: 06/02/2017 PCP: Thressa Sheller, MD  Interim summary and HPI 61 y.o. female with medical history Asthma, COPD, hypertension, CAD status post MI, history of PE on Coumadin, chronic back pain, presenting to the ED secondary to worsening back pain, associated right leg sciatica and intermittent paresthesia. Pain is intractable and is affecting ADL's.     Assessment/Plan: 1-intractable lower back pain: due to spinal stenosis with sciatica and intermittent paresthesia. -patient coninue to have pain, even somewhat improved today. -s/p transforaminal block, with just minimal response  -neurosurgery planning myelography and potential debridement and closure (currently having some CSF leak as per neurosurgery assessment)  -will follow further recommendations and continue PRN analgesics  2-chronic PE and hx of DVT -will continue coumadin per pharmacy once clear by neurosurgery  -continue holding for now  3-hx of chronic UTI -stable and currently denying dysuria  -will continue trimpex  4-hx of asthma -stable and at baseline -no wheezing -will continue PRN albuterol  5-HTN -will continue home antihypertensive regimen  -BP has remained stable and well controlled  6-HLD -continue statins   7-GERD -continue PPI  8-obesity -Body mass index is 49.79 kg/m. -low calorie diet has been encouraged   9-hx of anxiety -continue PRN valium   Code Status: Full Family Communication: no family at bedside  Disposition Plan: appreciate IR and neurosurgery for evaluation adn rec's. Planning myelography and most likely surgical intervention as per neurosurgery rec's.   Consultants:  Neurosurgery   Palliative care   Procedures:  See below for x-ray reports   transforaminal L5-S1  Myelography 8/14  Antibiotics:  None   HPI/Subjective: Afebrile, no CP, no SOB. Continue to experience lumbar pain  and radiculopathy   Objective: Vitals:   06/05/17 1100 06/05/17 1300  BP: 104/64 (!) 101/55  Pulse:  68  Resp:  18  Temp:  97.9 F (36.6 C)  SpO2:  93%    Intake/Output Summary (Last 24 hours) at 06/05/17 1927 Last data filed at 06/05/17 1700  Gross per 24 hour  Intake              360 ml  Output                0 ml  Net              360 ml   Filed Weights   06/03/17 0300 06/05/17 0500  Weight: 129.7 kg (286 lb) 127.5 kg (281 lb 1.6 oz)    Exam:   General: afebrile, denies CP and SOB. Patient continue having lower back pain and radiculopathy, even slightly improved after transforaminal block at L5-S1.  Cardiovascular: RRR, no rubs, no gallops, no murmurs  Respiratory: good air movement, no using accessory muscles, normal effort.   Abdomen: obese, soft, NT, ND, positive BS  Musculoskeletal: some improvement appreciated in her ability to move right leg, but continue to have pain and radiculopathy limiting effort and affecting mobility. No cyanosis or clubbing seen.   Data Reviewed: Basic Metabolic Panel:  Recent Labs Lab 06/02/17 0934 06/03/17 0407  NA 139 138  K 4.0 3.9  CL 103 101  CO2 24 27  GLUCOSE 109* 107*  BUN 15 12  CREATININE 1.01* 0.81  CALCIUM 9.6 8.9   Liver Function Tests:  Recent Labs Lab 06/03/17 0407  AST 25  ALT 20  ALKPHOS 64  BILITOT 0.9  PROT 7.2  ALBUMIN 3.4*   CBC:  Recent Labs Lab  06/02/17 0934 06/03/17 0407  WBC 14.1* 11.0*  NEUTROABS 9.4*  --   HGB 14.5 12.9  HCT 45.6 42.0  MCV 83.1 84.3  PLT 309 265   CBG: No results for input(s): GLUCAP in the last 168 hours.  Recent Results (from the past 240 hour(s))  Culture, Urine     Status: Abnormal   Collection Time: 06/02/17  1:00 AM  Result Value Ref Range Status   Specimen Description URINE, CATHETERIZED  Final   Special Requests NONE  Final   Culture (A)  Final    70,000 COLONIES/mL PSEUDOMONAS AERUGINOSA 80,000 COLONIES/mL VIRIDANS STREPTOCOCCUS CALL  MICROBIOLOGY LAB IF SENSITIVITIES ARE REQUIRED.    Report Status 06/05/2017 FINAL  Final   Organism ID, Bacteria PSEUDOMONAS AERUGINOSA (A)  Final      Susceptibility   Pseudomonas aeruginosa - MIC*    CEFTAZIDIME 8 SENSITIVE Sensitive     CIPROFLOXACIN <=0.25 SENSITIVE Sensitive     GENTAMICIN <=1 SENSITIVE Sensitive     IMIPENEM 2 SENSITIVE Sensitive     PIP/TAZO 16 SENSITIVE Sensitive     CEFEPIME 4 SENSITIVE Sensitive     * 70,000 COLONIES/mL PSEUDOMONAS AERUGINOSA  Culture, blood (Routine X 2) w Reflex to ID Panel     Status: None (Preliminary result)   Collection Time: 06/02/17  5:22 PM  Result Value Ref Range Status   Specimen Description BLOOD LEFT ARM  Final   Special Requests   Final    BOTTLES DRAWN AEROBIC AND ANAEROBIC Blood Culture adequate volume   Culture NO GROWTH 3 DAYS  Final   Report Status PENDING  Incomplete  Culture, blood (Routine X 2) w Reflex to ID Panel     Status: None (Preliminary result)   Collection Time: 06/02/17  6:24 PM  Result Value Ref Range Status   Specimen Description BLOOD RIGHT ARM  Final   Special Requests IN PEDIATRIC BOTTLE Blood Culture adequate volume  Final   Culture NO GROWTH 3 DAYS  Final   Report Status PENDING  Incomplete     Studies: Ir Fl Guided Loc Of Needle/cath Tip For Spinal Inject Rt  Result Date: 06/04/2017 CLINICAL DATA:  Right lower extremity radiculopathy. Right S1 radiculopathy. Calcified L5-S1 disc. FLUOROSCOPY TIME:  Radiation Exposure Index (as provided by the fluoroscopic device): 17 uGy*m2 Fluoroscopy Time:  43 seconds Number of Acquired Images:  0 PROCEDURE: Right S1 NERVE ROOT BLOCK WITH TRANSFORAMINAL EPIDURAL STEROID INJECTION Overlying skin prepped with Betadine, draped in the usual sterile fashion, and infiltrated locally with 1% Lidocaine. Curved 22 gauge spinal needle advanced into the caudal epidural space via the right S1 sacral foramen. Diagnostic injection of 1 ml Isovue-M 200 demonstrates good epidural  spread partially outlining the right S1 nerve root. No intravascular uptake. 120 mg Depo-Medrol and 1.5 ml 1% Lidocaine were then administered. No immediate complication. IMPRESSION: Technically successful right S1 selective nerve root block with transforaminal epidural steroid injection. Electronically Signed   By: San Morelle M.D.   On: 06/04/2017 10:31    Scheduled Meds: . aspirin EC  81 mg Oral Q1200  . estradiol  1 Applicatorful Vaginal QHS  . losartan  50 mg Oral Q1200  . metoprolol succinate  50 mg Oral Daily  . pantoprazole  40 mg Oral Daily  . pravastatin  40 mg Oral Q1200  . trimethoprim  100 mg Oral Daily  . Vitamin D (Ergocalciferol)  50,000 Units Oral Q7 days   Continuous Infusions:  Active Problems:   Asthma  GERD (gastroesophageal reflux disease)   Hyperlipidemia   Chronic pulmonary embolism (HCC)   Right leg pain   Severe low back pain   Acute right-sided low back pain with sciatica   Palliative care by specialist   Lumbar back pain with radiculopathy affecting right lower extremity    Time spent: 25 minutes    Barton Dubois  [facility] Pager [phone]. If 7PM-7AM, please contact night-coverage at [email or web address], password [facility] 06/05/2017, 7:27 PM  LOS: 0 days

## 2017-06-05 NOTE — Progress Notes (Signed)
Patient ID: [name], female   DOB: 09-Dec-1955, 61 y.o.   MRN: [id number] Final signs are stable Motor function appears good However patient is draining substantial fluid from her back Is consistent with CSF She'll need surgical debridement and closure Help plan is for tomorrow

## 2017-06-05 NOTE — Care Management Note (Signed)
Case Management Note  Patient Details  Name: [name] MRN: [id number] Date of Birth: 1956/04/29  Subjective/Objective: 61 yr old female admitted with back pain, difficulty ambulating.  Patient will need surgical intervention. Dr. Ellene Route consulted.   Action/Plan: Case manager spoke with patient concerning discharge plan. Should she go home she would need equipment and would like [employer]. CM called refrral to Stevie Kern, [place] Liaison.  Will continue to monitor.  Expected Discharge Date:     pending             Expected Discharge Plan:     In-House Referral:     Discharge planning Services  CM Consult  Post Acute Care Choice:  Durable Medical Equipment Choice offered to:  Patient  DME Arranged:  3-N-1, Walker rolling, Hospital bed, [facility] DME Agency:  [place]:    [facility] Agency:     Status of Service:  In process, will continue to follow  If discussed at Long Length of Stay Meetings, dates discussed:    Additional Comments:  Ninfa Meeker, RN 06/05/2017, 3:50 PM

## 2017-06-06 ENCOUNTER — Encounter (HOSPITAL_COMMUNITY): Payer: Self-pay | Admitting: General Practice

## 2017-06-06 ENCOUNTER — Inpatient Hospital Stay (HOSPITAL_COMMUNITY): Payer: Medicare Other

## 2017-06-06 DIAGNOSIS — N390 Urinary tract infection, site not specified: Secondary | ICD-10-CM

## 2017-06-06 DIAGNOSIS — J45902 Unspecified asthma with status asthmaticus: Secondary | ICD-10-CM

## 2017-06-06 MED ORDER — IOPAMIDOL (ISOVUE-M 200) INJECTION 41%
INTRAMUSCULAR | Status: AC
  Filled 2017-06-06: qty 10

## 2017-06-06 MED ORDER — LIDOCAINE HCL (PF) 1 % IJ SOLN: 5.0000 mL | Freq: Once | INTRAMUSCULAR | Status: DC

## 2017-06-06 MED ORDER — IOPAMIDOL (ISOVUE-200) INJECTION 41%
20.0000 mL | Freq: Once | INTRAVENOUS | Status: AC | PRN
  Administered 2017-06-07: 20 mL
  Filled 2017-06-06: qty 50

## 2017-06-06 MED ORDER — LIDOCAINE HCL (PF) 1 % IJ SOLN
INTRAMUSCULAR | Status: AC
  Administered 2017-06-06: 5 mL
  Filled 2017-06-06: qty 5

## 2017-06-06 MED ORDER — LIDOCAINE HCL (PF) 1 % IJ SOLN
5.0000 mL | Freq: Once | INTRAMUSCULAR | Status: AC
  Administered 2017-06-06: 5 mL

## 2017-06-06 MED ORDER — SODIUM CHLORIDE 0.9 % IV SOLN
INTRAVENOUS | Status: DC
  Administered 2017-06-06: 14:00:00 via INTRAVENOUS

## 2017-06-06 MED ORDER — LIDOCAINE HCL (PF) 1 % IJ SOLN
INTRAMUSCULAR | Status: AC
  Administered 2017-06-06: 5 mL
  Filled 2017-06-06: qty 10

## 2017-06-06 NOTE — Progress Notes (Signed)
Patient ID: [name], female   DOB: 03/15/1956, 62 y.o.   MRN: [id number] Myelogram not obtained today secondary to dry tap 3 Will reattempt myelogram tomorrow per radiology

## 2017-06-06 NOTE — Progress Notes (Addendum)
[facility] PROGRESS NOTE  [name] VEL:[id number] DOB: 08-Mar-1956 DOA: 06/02/2017 PCP: Thressa Sheller, MD  Interim summary and HPI 61 y.o. female with medical history Asthma, COPD, hypertension, CAD status post MI, history of PE on Coumadin, chronic back pain, presenting to the ED secondary to worsening back pain, associated right leg sciatica and intermittent paresthesia. Pain is intractable and is affecting ADL's.     Assessment/Plan: 1-intractable lower back pain: due to spinal stenosis with sciatica and intermittent paresthesia. -patient continue to have pain and now also CSF leakage  -S/P transforaminal block, with just minimal response (on 8/13) -neurosurgery was planning myelography and potential debridement and closure; but after 3 dry taps myelography unable to be done. -IR recommended overnight hydration and will re-attempt again tomorrow morning (8/15) -will follow further recommendations and continue PRN analgesics for now; per palliative care team rec's, will need pain clinic at discharge.  2-chronic PE and hx of DVT -will continue coumadin per pharmacy once clear by neurosurgery  -continue holding for now  3-hx of chronic UTI -stable and currently denying dysuria  -will continue trimpex -patient reported good urine output  -strep viridans and pseudomonas grew up in urine cx, most likely colonization.  4-hx of asthma -stable and at baseline -no wheezing -will continue PRN albuterol  5-HTN -will continue home antihypertensive regimen  -BP has remained stable and well controlled  6-HLD -continue statins   7-GERD -continue PPI  8-morbid obesity: Class 3 -Body mass index is 49.79 kg/m. -low calorie diet has been encouraged   9-hx of anxiety -continue PRN valium   Code Status: Full Family Communication: no family at bedside  Disposition Plan: appreciate IR and neurosurgery for evaluation adn rec's. Planning myelography tomorrow (re-attempt,  3 dry taps today) and most likely surgical intervention as per neurosurgery.   Consultants:  Neurosurgery   IR  Palliative care   Procedures:  See below for x-ray reports   transforaminal L5-S1  Myelography 8/15  Antibiotics:  None   HPI/Subjective: Afebrile, no CP, no SOB. Patient continue to have pain in her lower back with radiculopathy.  Objective: Vitals:   06/05/17 2021 06/06/17 0541  BP: (!) 105/51 100/60  Pulse: 83 73  Resp: 18 18  Temp: 98 F (36.7 C) (!) 97.5 F (36.4 C)  SpO2: 96% 98%    Intake/Output Summary (Last 24 hours) at 06/06/17 0936 Last data filed at 06/05/17 1700  Gross per 24 hour  Intake              360 ml  Output                0 ml  Net              360 ml   Filed Weights   06/03/17 0300 06/05/17 0500  Weight: 129.7 kg (286 lb) 127.5 kg (281 lb 1.6 oz)    Exam:  General: afebrile, no CP, no SOB, no nausea, no vomiting. Continue experiencing lumbar pain and radiculopathy to RLE.   Rest of her physical exam has remained unchanged since 8/13; please see below for details:   Cardiovascular: RRR, no rubs, no gallops, no murmurs  Respiratory: good air movement, no using accessory muscles, normal effort.   Abdomen: obese, soft, NT, ND, positive BS  Musculoskeletal: some improvement appreciated in her ability to move right leg, but continue to have pain and radiculopathy limiting effort and affecting mobility. No cyanosis or clubbing seen.   Data Reviewed: Basic Metabolic Panel:  Recent Labs Lab 06/02/17 0934 06/03/17 0407  NA 139 138  K 4.0 3.9  CL 103 101  CO2 24 27  GLUCOSE 109* 107*  BUN 15 12  CREATININE 1.01* 0.81  CALCIUM 9.6 8.9   Liver Function Tests:  Recent Labs Lab 06/03/17 0407  AST 25  ALT 20  ALKPHOS 64  BILITOT 0.9  PROT 7.2  ALBUMIN 3.4*   CBC:  Recent Labs Lab 06/02/17 0934 06/03/17 0407  WBC 14.1* 11.0*  NEUTROABS 9.4*  --   HGB 14.5 12.9  HCT 45.6 42.0  MCV 83.1 84.3  PLT 309  265   CBG: No results for input(s): GLUCAP in the last 168 hours.  Recent Results (from the past 240 hour(s))  Culture, Urine     Status: Abnormal   Collection Time: 06/02/17  1:00 AM  Result Value Ref Range Status   Specimen Description URINE, CATHETERIZED  Final   Special Requests NONE  Final   Culture (A)  Final    70,000 COLONIES/mL PSEUDOMONAS AERUGINOSA 80,000 COLONIES/mL VIRIDANS STREPTOCOCCUS CALL MICROBIOLOGY LAB IF SENSITIVITIES ARE REQUIRED.    Report Status 06/05/2017 FINAL  Final   Organism ID, Bacteria PSEUDOMONAS AERUGINOSA (A)  Final      Susceptibility   Pseudomonas aeruginosa - MIC*    CEFTAZIDIME 8 SENSITIVE Sensitive     CIPROFLOXACIN <=0.25 SENSITIVE Sensitive     GENTAMICIN <=1 SENSITIVE Sensitive     IMIPENEM 2 SENSITIVE Sensitive     PIP/TAZO 16 SENSITIVE Sensitive     CEFEPIME 4 SENSITIVE Sensitive     * 70,000 COLONIES/mL PSEUDOMONAS AERUGINOSA  Culture, blood (Routine X 2) w Reflex to ID Panel     Status: None (Preliminary result)   Collection Time: 06/02/17  5:22 PM  Result Value Ref Range Status   Specimen Description BLOOD LEFT ARM  Final   Special Requests   Final    BOTTLES DRAWN AEROBIC AND ANAEROBIC Blood Culture adequate volume   Culture NO GROWTH 3 DAYS  Final   Report Status PENDING  Incomplete  Culture, blood (Routine X 2) w Reflex to ID Panel     Status: None (Preliminary result)   Collection Time: 06/02/17  6:24 PM  Result Value Ref Range Status   Specimen Description BLOOD RIGHT ARM  Final   Special Requests IN PEDIATRIC BOTTLE Blood Culture adequate volume  Final   Culture NO GROWTH 3 DAYS  Final   Report Status PENDING  Incomplete     Studies: Ir Fl Guided Loc Of Needle/cath Tip For Spinal Inject Rt  Result Date: 06/04/2017 CLINICAL DATA:  Right lower extremity radiculopathy. Right S1 radiculopathy. Calcified L5-S1 disc. FLUOROSCOPY TIME:  Radiation Exposure Index (as provided by the fluoroscopic device): 17 uGy*m2 Fluoroscopy  Time:  43 seconds Number of Acquired Images:  0 PROCEDURE: Right S1 NERVE ROOT BLOCK WITH TRANSFORAMINAL EPIDURAL STEROID INJECTION Overlying skin prepped with Betadine, draped in the usual sterile fashion, and infiltrated locally with 1% Lidocaine. Curved 22 gauge spinal needle advanced into the caudal epidural space via the right S1 sacral foramen. Diagnostic injection of 1 ml Isovue-M 200 demonstrates good epidural spread partially outlining the right S1 nerve root. No intravascular uptake. 120 mg Depo-Medrol and 1.5 ml 1% Lidocaine were then administered. No immediate complication. IMPRESSION: Technically successful right S1 selective nerve root block with transforaminal epidural steroid injection. Electronically Signed   By: San Morelle M.D.   On: 06/04/2017 10:31    Scheduled Meds: . iopamidol      .  lidocaine (PF)      . aspirin EC  81 mg Oral Q1200  . estradiol  1 Applicatorful Vaginal QHS  . losartan  50 mg Oral Q1200  . metoprolol succinate  50 mg Oral Daily  . pantoprazole  40 mg Oral Daily  . pravastatin  40 mg Oral Q1200  . trimethoprim  100 mg Oral Daily  . Vitamin D (Ergocalciferol)  50,000 Units Oral Q7 days   Continuous Infusions:  Active Problems:   Asthma   GERD (gastroesophageal reflux disease)   Hyperlipidemia   Chronic pulmonary embolism (HCC)   Right leg pain   Severe low back pain   Acute right-sided low back pain with sciatica   Palliative care by specialist   Lumbar back pain with radiculopathy affecting right lower extremity    Time spent: 25 minutes    Barton Dubois  [facility] Pager [phone]. If 7PM-7AM, please contact night-coverage at [email or web address], password [facility] 06/06/2017, 9:36 AM  LOS: 1 day

## 2017-06-06 NOTE — Procedures (Signed)
Radiology procedure note.  Unsuccessful myelogram, with dry tap at 3 levels. See imaging report.  Suggest hydration and reattempt tomorrow.   JWatts MD

## 2017-06-07 ENCOUNTER — Inpatient Hospital Stay (HOSPITAL_COMMUNITY): Payer: Medicare Other

## 2017-06-07 LAB — CULTURE, BLOOD (ROUTINE X 2)
CULTURE: NO GROWTH
CULTURE: NO GROWTH
Special Requests: ADEQUATE
Special Requests: ADEQUATE

## 2017-06-07 LAB — CBC
HCT: 40 % (ref 36.0–46.0)
Hemoglobin: 12.3 g/dL (ref 12.0–15.0)
MCH: 26.1 pg (ref 26.0–34.0)
MCHC: 30.8 g/dL (ref 30.0–36.0)
MCV: 84.7 fL (ref 78.0–100.0)
PLATELETS: 287 10*3/uL (ref 150–400)
RBC: 4.72 MIL/uL (ref 3.87–5.11)
RDW: 16.7 % — AB (ref 11.5–15.5)
WBC: 10.5 10*3/uL (ref 4.0–10.5)

## 2017-06-07 LAB — BASIC METABOLIC PANEL
Anion gap: 10 (ref 5–15)
BUN: 7 mg/dL (ref 6–20)
CALCIUM: 8.7 mg/dL — AB (ref 8.9–10.3)
CO2: 26 mmol/L (ref 22–32)
CREATININE: 0.93 mg/dL (ref 0.44–1.00)
Chloride: 104 mmol/L (ref 101–111)
GFR calc non Af Amer: >60 mL/min (ref >60)
Glucose, Bld: 116 mg/dL — ABNORMAL HIGH (ref 65–99)
Potassium: 4 mmol/L (ref 3.5–5.1)
SODIUM: 140 mmol/L (ref 135–145)

## 2017-06-07 MED ORDER — LIDOCAINE HCL (PF) 1 % IJ SOLN
INTRAMUSCULAR | Status: AC
  Filled 2017-06-07: qty 5

## 2017-06-07 MED ORDER — IOPAMIDOL (ISOVUE-M 200) INJECTION 41%
INTRAMUSCULAR | Status: AC
  Filled 2017-06-07: qty 10

## 2017-06-07 MED ORDER — HYDROMORPHONE HCL 1 MG/ML IJ SOLN
INTRAMUSCULAR | Status: AC
  Filled 2017-06-07: qty 1

## 2017-06-07 MED ORDER — IOPAMIDOL (ISOVUE-M 200) INJECTION 41%: 20.0000 mL | Freq: Once | INTRAMUSCULAR | Status: DC

## 2017-06-07 MED ORDER — LIDOCAINE HCL (PF) 1 % IJ SOLN
5.0000 mL | Freq: Once | INTRAMUSCULAR | Status: AC
  Administered 2017-06-07: 15 mL via INTRADERMAL

## 2017-06-07 NOTE — Progress Notes (Signed)
Patient ID: [name], female   DOB: November 02, 1955, 61 y.o.   MRN: [id number] Patient had her myelogram today I reviewed the study and note that she has severe stenosis at L2-3 L3-4 and L5-S1 above and below her previous fusion at L4-L5. Patient ultimately need surgical decompression of these areas but this is a substantial operation requiring about 45 hours of general anesthesia and a prolonged recuperation Her hardware will need to be removed and revised to include L5-S1 I will make efforts to schedule this but whether he can be done during this hospitalization remains to be seen She understands the nature of her problem and the fact that she will need an extensive operation We'll try to schedule it at the earliest convenience

## 2017-06-07 NOTE — Consult Note (Signed)
           [facility] CM Primary Care Navigator  06/07/2017  [name] [birth year] [id number]   Went to seepatientat the bedside to identify possible discharge needs.  Patient reports having worsening lower back pain, right leg pain and weakness that had led to this admission and possible surgery. Patient endorses Dr. Thressa Sheller with [facility] as the primary care provider.   Patient shared using [facility] in [place] to obtain medications without difficulty.   Patient reports managing her own medications at home straight out of the containers using her own organizing system.   Patient states that brother Thayer Jew) or sister (Becky)provide transportation to her doctors'appointments. She mentioned having to reschedule appointments at times when they are on vacation and nobody is available to transport her.   [facility] list of transportation resources provided for back-up use. She is aware to call [facility] CM for resources if she has further transportation needs.  Patient reports that she lives with her dad and brother who assist her with care needs on the weekends. Patient's sister provides assistance during the week per patient.  Discharge plan is not yet determined pending possible surgical intervention according to patient.  Patient expressedunderstanding to call primary care provider's officewhen she returns back home,for a post discharge follow-up appointment within a week or sooner if needed.Patient letter (with PCP's contact number) was provided as areminder.  Explained to patient about [facility] CM services available for healthmanagement but she verbalized that COPD/ Asthma has been managed at home and has not been an issue per patient.  Patient is aware to requestreferral to [facility] care managementservicesfrom primary care provider if deemed necessary in the future.   [facility] care management contact information provided for future needs that may  arise.    For questions, please contact:  Dannielle Huh, BSN, RN- [facility] Primary Care Navigator  Telephone: [phone] [place]

## 2017-06-07 NOTE — Progress Notes (Signed)
  PROGRESS NOTE  [name] QHU:[id number] DOB: 03-Mar-1956 DOA: 06/02/2017 PCP: Thressa Sheller, MD  Brief Narrative: 61 year old woman PMH COPD, coronary artery and a disease, pulmonary embolism on warfarin, chronic back pain presented with worsening back pain, right leg sciatica and intermittent paresthesia. Admitted for intractable pain, neurosurgery evaluation.  Assessment/Plan Intractable lower back pain, with associated sciatica and intermittent paresthesias, secondary to spinal stenosis pain. Status post transforaminal block with minimal response 8/13, however now with CSF leak. Myelography was attempted but unsuccessful, plans were made to rehydrate the patient and attempt again today. -Continue IV hydration, myelogram per interventional radiology -Neurosurgery recommended surgical debridement and closure -Continue analgesicsas needed.  PMH pulmonary embolism, DVT -Warfarin on hold pending procedure. Resume anticoagulation when cleared by neurosurgery.  PMH chronic UTI, asymptomatic bacteriuria -Patient asymptomatic. Continue trimethoprim. Streptococcus viridans and Pseudomonas felt to be colonization.  Asthma. -Remains stable. Continue bronchodilators as needed.  Essential hypertension, has remained stable.  Morbid obesity class III. -Recommend weight loss under the supervision of her primary care physician  DVT prophylaxis: SCDs Code Status: full code Family Communication: none Disposition Plan: Home. No PT follow up recommended this point.   [name], MD  [facility] Direct contact: [phone] --Via amion app OR  --[email or web address]; password [email or web address]  7PM-7AM contact night coverage as above 06/07/2017, 9:58 AM  LOS: 2 days   Consultants:  Neurosurgery  IR  Procedures:  8/12 Right S1 nerve root epidural steroid injection performed for Right S1 radiculopathy secondary to stenosis at L5/S1  Antimicrobials:    Interval  history/Subjective: Currently pain is fairly well controlled.  Objective: Vitals: Afebrile, 98.4, 18, 85, 120/64, 94% on room air  Exam:     Constitutional: appears calm, comfortable. Lying in bed.  Respiratory. Clear to auscultation bilaterally. No wheezes, rales or rhonchi. Normal respiratory effort.  Cardiovascular. Regular rate and rhythm. No murmur, rub or gallop. No lower extremity edema.  Psychiatric. Grossly normal mood and affect. Speech fluent and appropriate.   I have personally reviewed the following:   Labs:  Basic metabolic panel unremarkable  CBC unremarkable  Scheduled Meds: . aspirin EC  81 mg Oral Q1200  . estradiol  1 Applicatorful Vaginal QHS  . lidocaine (PF)  5 mL Other Once  . losartan  50 mg Oral Q1200  . metoprolol succinate  50 mg Oral Daily  . pantoprazole  40 mg Oral Daily  . pravastatin  40 mg Oral Q1200  . trimethoprim  100 mg Oral Daily  . Vitamin D (Ergocalciferol)  50,000 Units Oral Q7 days   Continuous Infusions: . sodium chloride 75 mL/hr at 06/06/17 1427    Active Problems:   Asthma   GERD (gastroesophageal reflux disease)   Hyperlipidemia   Chronic pulmonary embolism (HCC)   Right leg pain   Severe low back pain   Acute right-sided low back pain with sciatica   Palliative care by specialist   Lumbar back pain with radiculopathy affecting right lower extremity   LOS: 2 days

## 2017-06-08 ENCOUNTER — Inpatient Hospital Stay (HOSPITAL_COMMUNITY): Payer: Medicare Other

## 2017-06-08 MED ORDER — CYCLOBENZAPRINE HCL 10 MG PO TABS
10.0000 mg | ORAL_TABLET | Freq: Three times a day (TID) | ORAL | Status: DC | PRN
  Administered 2017-06-08 – 2017-06-15 (×5): 10 mg via ORAL
  Filled 2017-06-08 (×6): qty 1

## 2017-06-08 NOTE — Progress Notes (Addendum)
PROGRESS NOTE  [name] RUE:[id number] DOB: August 12, 1956 DOA: 06/02/2017   PCP: Thressa Sheller, MD  Brief Narrative: 61 year old woman PMH COPD, coronary artery and a disease, pulmonary embolism on warfarin, chronic back pain presented with worsening back pain, right leg sciatica and intermittent paresthesia. Admitted for intractable pain, neurosurgery evaluation.  Assessment/Plan  Intractable lower back pain - with associated sciatica and intermittent paresthesias, secondary to spinal stenosis pain.  - Status post transforaminal block with minimal response 8/13 - per neurosurgery, worse area of stenosis is at L5-S1 causing severe compromise of the right sided Sq nerve root - plan for limited operation to decompress and stabilize L5-S1 - analgesia as needed   Dyspnea this AM - stop IVF - continue home regimen with Lasix - monitor resp status  - CXR pending   PMH pulmonary embolism, DVT - Warfarin on hold pending procedure - resume AC when neurosurgery OK   PMH chronic UTI, asymptomatic bacteriuria - Patient asymptomatic. Continue trimethoprim.  - Streptococcus viridans and Pseudomonas felt to be colonization.  Asthma. - resp status stable - allow BD as needed   Essential hypertension - overall stable   Morbid obesity class III. - Body mass index is 49.21 kg/m.  DVT prophylaxis: SCD's Code Status: Full  Family Communication: Patient at bedside  Disposition Plan: to be determined   Consultants:  Neurosurgery  IR  Procedures:  8/12 Right S1 nerve root epidural steroid injection performed for Right S1 radiculopathy secondary to stenosis at L5/S1  Antimicrobials:  Trimethoprim   Subjective: No events overnight.   Objective: Vitals:   06/07/17 2138 06/08/17 0543 06/08/17 1056 06/08/17 1500  BP: 117/70 (!) 145/73 (!) 148/72 128/64  Pulse: 73 72  67  Resp: 18 18  18   Temp: (!) 97.5 F (36.4 C) (!) 97.5 F (36.4 C)  98.4 F (36.9 C)  TempSrc:  Oral Oral  Oral  SpO2: 97% 100%  96%  Weight:        Intake/Output Summary (Last 24 hours) at 06/08/17 1741 Last data filed at 06/08/17 1500  Gross per 24 hour  Intake             1230 ml  Output             4000 ml  Net            -2770 ml   Filed Weights   06/03/17 0300 06/05/17 0500 06/07/17 0500  Weight: 129.7 kg (286 lb) 127.5 kg (281 lb 1.6 oz) 126 kg (277 lb 12.8 oz)    Examination:  General exam: Appears calm and comfortable  Respiratory system: rhonchi at bases Cardiovascular system: S1 & S2 heard, RRR. No JVD, murmurs, rubs, gallops or clicks. No pedal edema. Gastrointestinal system: Abdomen is nondistended, soft and nontender. No organomegaly or masses felt.  Central nervous system: Alert and oriented.   Data Reviewed: I have personally reviewed following labs and imaging studies  CBC:  Recent Labs Lab 06/02/17 0934 06/03/17 0407 06/07/17 0525  WBC 14.1* 11.0* 10.5  NEUTROABS 9.4*  --   --   HGB 14.5 12.9 12.3  HCT 45.6 42.0 40.0  MCV 83.1 84.3 84.7  PLT 309 265 147   Basic Metabolic Panel:  Recent Labs Lab 06/02/17 0934 06/03/17 0407 06/07/17 0525  NA 139 138 140  K 4.0 3.9 4.0  CL 103 101 104  CO2 24 27 26   GLUCOSE 109* 107* 116*  BUN 15 12 7   CREATININE 1.01* 0.81 0.93  CALCIUM  9.6 8.9 8.7*   Liver Function Tests:  Recent Labs Lab 06/03/17 0407  AST 25  ALT 20  ALKPHOS 64  BILITOT 0.9  PROT 7.2  ALBUMIN 3.4*   Coagulation Profile:  Recent Labs Lab 06/02/17 0934 06/03/17 0407 06/04/17 0510  INR 1.90 1.75 1.62   Urine analysis:    Component Value Date/Time   COLORURINE YELLOW 06/02/2017 1440   APPEARANCEUR HAZY (A) 06/02/2017 1440   LABSPEC 1.024 06/02/2017 1440   PHURINE 5.0 06/02/2017 1440   GLUCOSEU NEGATIVE 06/02/2017 1440   HGBUR NEGATIVE 06/02/2017 1440   BILIRUBINUR NEGATIVE 06/02/2017 1440   KETONESUR NEGATIVE 06/02/2017 1440   PROTEINUR NEGATIVE 06/02/2017 1440   UROBILINOGEN 1.0 04/15/2013 2331   NITRITE  NEGATIVE 06/02/2017 1440   LEUKOCYTESUR NEGATIVE 06/02/2017 1440    Recent Results (from the past 240 hour(s))  Culture, Urine     Status: Abnormal   Collection Time: 06/02/17  1:00 AM  Result Value Ref Range Status   Specimen Description URINE, CATHETERIZED  Final   Special Requests NONE  Final   Culture (A)  Final    70,000 COLONIES/mL PSEUDOMONAS AERUGINOSA 80,000 COLONIES/mL VIRIDANS STREPTOCOCCUS CALL MICROBIOLOGY LAB IF SENSITIVITIES ARE REQUIRED.    Report Status 06/05/2017 FINAL  Final   Organism ID, Bacteria PSEUDOMONAS AERUGINOSA (A)  Final      Susceptibility   Pseudomonas aeruginosa - MIC*    CEFTAZIDIME 8 SENSITIVE Sensitive     CIPROFLOXACIN <=0.25 SENSITIVE Sensitive     GENTAMICIN <=1 SENSITIVE Sensitive     IMIPENEM 2 SENSITIVE Sensitive     PIP/TAZO 16 SENSITIVE Sensitive     CEFEPIME 4 SENSITIVE Sensitive     * 70,000 COLONIES/mL PSEUDOMONAS AERUGINOSA  Culture, blood (Routine X 2) w Reflex to ID Panel     Status: None   Collection Time: 06/02/17  5:22 PM  Result Value Ref Range Status   Specimen Description BLOOD LEFT ARM  Final   Special Requests   Final    BOTTLES DRAWN AEROBIC AND ANAEROBIC Blood Culture adequate volume   Culture NO GROWTH 5 DAYS  Final   Report Status 06/07/2017 FINAL  Final  Culture, blood (Routine X 2) w Reflex to ID Panel     Status: None   Collection Time: 06/02/17  6:24 PM  Result Value Ref Range Status   Specimen Description BLOOD RIGHT ARM  Final   Special Requests IN PEDIATRIC BOTTLE Blood Culture adequate volume  Final   Culture NO GROWTH 5 DAYS  Final   Report Status 06/07/2017 FINAL  Final      Radiology Studies: Ct Lumbar Spine W Contrast  Result Date: 06/07/2017 CLINICAL DATA:  Right lumbar radiculopathy. Nondiagnostic MRI of due to hardware artifact. EXAM: LUMBAR MYELOGRAM FLUOROSCOPY TIME:  3 minutes 30 seconds 98.3 mGy air kerma PROCEDURE: Informed written consent was again obtained. Patient knows she is at  greater risk that most patients do to flexeril use and recent Coumadin use. She has allergies, but has tolerated IV contrast successfully in the past. Consent was obtained by Dr. Monte Fantasia. Time out form was completed. Patient was positioned prone on the fluoroscopy table. The spine was localized and prepped and draped in standard sterile fashion. 1% lidocaine was used for skin and subcutaneous tissue anesthesia where needed. A 5 inch 20 gauge spinal needle was used. Puncture was attempted at the L2-3 level, but the space between the lamina could not be found despite multiple exposures (needed to penetrate the soft tissues)  and patient repositioning. The L1-2 level was then selected. This projected over the mid L2 body, well below the conus based on prior MRI. Puncture was performed using a 5 inch 20 gauge spinal needle. Initially, CSF was not visualized. Central canal needle tip placement was confirmed in the lateral projection and ultimately low pressure CSF was seen. 15 mL of Isovue 200 M was injected into the thecal sac, with normal opacification of the nerve roots and cauda equina consistent with free flow within the subarachnoid space. I personally performed the lumbar puncture and administered the intrathecal contrast. I also personally supervised acquisition of the myelogram images. TECHNIQUE: Contiguous axial images were obtained through the Lumbar spine after the intrathecal infusion of infusion. Coronal and sagittal reconstructions were obtained of the axial image sets. COMPARISON:  MRI 06/02/2017 FINDINGS: LUMBAR MYELOGRAM FINDINGS: High exposure study, dedicated images would be low yield in this patient who was unable to position herself without assistance. Post injection exposures were deferred. CT LUMBAR MYELOGRAM FINDINGS: Segmentation:  Normal Alignment: Slight degenerative anterolisthesis at L3-4 and retrolisthesis at L2-3. Vertebrae:  Negative for fracture, bone lesion, or endplate erosion.  Conus: Normal size and morphology, tip terminating at the L1 body level. Disc levels: T12- L1: Spondylosis with ankylosing ventral osteophyte. No impingement L1-L2: Spondylosis with ankylosing ventral osteophyte. Tiny calcified right paracentral disc protrusion. No notable facet degenerative change. No impingement. L2-L3: Degenerative disc narrowing with mild endplate ridging. Facet and ligament thickening. Moderate spinal stenosis with crowding of the cauda equina. No discrete herniation or compression. Inferior foraminal effacement without L2 compression. L3-L4: Prominent facet arthropathy, especially on the right. There has been a left-sided partial facetectomy and laminotomy. Spinal stenosis is mild to moderate on the right without discrete compression or herniation. The cauda equina is crowded. Inferior foraminal effacement without L3 compression. L4-L5: Discectomy with solid arthrodesis. No residual impingement. Posterior rod and pedicle screws are in good position. L5-S1:Moderate degenerative disc narrowing and endplate ridging. Facet arthropathy with moderate hypertrophy. The thecal sac is narrow at baseline. Right paracentral endplate ridge causes prominent impingement on the S1 nerve root as it passes in front of the hypertrophic facet. There is mild foraminal narrowing. IMPRESSION: LUMBAR MYELOGRAM IMPRESSION: Successful intrathecal injection at L1-2. CT LUMBAR MYELOGRAM IMPRESSION: 1. L5-S1 right paracentral endplate ridge which compresses the right S1 nerve root. This spur has been present since at least 2014 abdominal CT. 2. L2-3 mild to moderate spinal stenosis without discrete compression. 3. L3-4 moderate spinal stenosis in the right canal without discrete compression. Better patency of the left canal after a left laminotomy and partial facetectomy. 4. L4-5 discectomy with solid arthrodesis and no residual impingement. 5. No compressive foraminal narrowing. Electronically Signed   By: Monte Fantasia M.D.   On: 06/07/2017 16:30   Dg Myelogram Lumbar  Result Date: 06/07/2017 CLINICAL DATA:  Right lumbar radiculopathy. Nondiagnostic MRI of due to hardware artifact. EXAM: LUMBAR MYELOGRAM FLUOROSCOPY TIME:  3 minutes 30 seconds 98.3 mGy air kerma PROCEDURE: Informed written consent was again obtained. Patient knows she is at greater risk that most patients do to flexeril use and recent Coumadin use. She has allergies, but has tolerated IV contrast successfully in the past. Consent was obtained by Dr. Monte Fantasia. Time out form was completed. Patient was positioned prone on the fluoroscopy table. The spine was localized and prepped and draped in standard sterile fashion. 1% lidocaine was used for skin and subcutaneous tissue anesthesia where needed. A 5 inch 20 gauge spinal needle  was used. Puncture was attempted at the L2-3 level, but the space between the lamina could not be found despite multiple exposures (needed to penetrate the soft tissues) and patient repositioning. The L1-2 level was then selected. This projected over the mid L2 body, well below the conus based on prior MRI. Puncture was performed using a 5 inch 20 gauge spinal needle. Initially, CSF was not visualized. Central canal needle tip placement was confirmed in the lateral projection and ultimately low pressure CSF was seen. 15 mL of Isovue 200 M was injected into the thecal sac, with normal opacification of the nerve roots and cauda equina consistent with free flow within the subarachnoid space. I personally performed the lumbar puncture and administered the intrathecal contrast. I also personally supervised acquisition of the myelogram images. TECHNIQUE: Contiguous axial images were obtained through the Lumbar spine after the intrathecal infusion of infusion. Coronal and sagittal reconstructions were obtained of the axial image sets. COMPARISON:  MRI 06/02/2017 FINDINGS: LUMBAR MYELOGRAM FINDINGS: High exposure study, dedicated  images would be low yield in this patient who was unable to position herself without assistance. Post injection exposures were deferred. CT LUMBAR MYELOGRAM FINDINGS: Segmentation:  Normal Alignment: Slight degenerative anterolisthesis at L3-4 and retrolisthesis at L2-3. Vertebrae:  Negative for fracture, bone lesion, or endplate erosion. Conus: Normal size and morphology, tip terminating at the L1 body level. Disc levels: T12- L1: Spondylosis with ankylosing ventral osteophyte. No impingement L1-L2: Spondylosis with ankylosing ventral osteophyte. Tiny calcified right paracentral disc protrusion. No notable facet degenerative change. No impingement. L2-L3: Degenerative disc narrowing with mild endplate ridging. Facet and ligament thickening. Moderate spinal stenosis with crowding of the cauda equina. No discrete herniation or compression. Inferior foraminal effacement without L2 compression. L3-L4: Prominent facet arthropathy, especially on the right. There has been a left-sided partial facetectomy and laminotomy. Spinal stenosis is mild to moderate on the right without discrete compression or herniation. The cauda equina is crowded. Inferior foraminal effacement without L3 compression. L4-L5: Discectomy with solid arthrodesis. No residual impingement. Posterior rod and pedicle screws are in good position. L5-S1:Moderate degenerative disc narrowing and endplate ridging. Facet arthropathy with moderate hypertrophy. The thecal sac is narrow at baseline. Right paracentral endplate ridge causes prominent impingement on the S1 nerve root as it passes in front of the hypertrophic facet. There is mild foraminal narrowing. IMPRESSION: LUMBAR MYELOGRAM IMPRESSION: Successful intrathecal injection at L1-2. CT LUMBAR MYELOGRAM IMPRESSION: 1. L5-S1 right paracentral endplate ridge which compresses the right S1 nerve root. This spur has been present since at least 2014 abdominal CT. 2. L2-3 mild to moderate spinal stenosis  without discrete compression. 3. L3-4 moderate spinal stenosis in the right canal without discrete compression. Better patency of the left canal after a left laminotomy and partial facetectomy. 4. L4-5 discectomy with solid arthrodesis and no residual impingement. 5. No compressive foraminal narrowing. Electronically Signed   By: Monte Fantasia M.D.   On: 06/07/2017 16:30    Scheduled Meds: . aspirin EC  81 mg Oral Q1200  . estradiol  1 Applicatorful Vaginal QHS  . iopamidol  20 mL Intra-articular Once  . losartan  50 mg Oral Q1200  . metoprolol succinate  50 mg Oral Daily  . pantoprazole  40 mg Oral Daily  . pravastatin  40 mg Oral Q1200  . trimethoprim  100 mg Oral Daily  . Vitamin D (Ergocalciferol)  50,000 Units Oral Q7 days   Continuous Infusions: . sodium chloride 75 mL/hr at 06/06/17 1427  LOS: 3 days   Time spent: 20 minutes   Faye Ramsay, MD [facility] Pager [phone]  If 7PM-7AM, please contact night-coverage [email or web address] Password [facility] 06/08/2017, 5:41 PM

## 2017-06-08 NOTE — Progress Notes (Signed)
Patient ID: [name], female   DOB: 1956/07/24, 61 y.o.   MRN: [id number] I have reviewed the patient's myelogram and post mild gram CAT scan with the radiologist. It appears that the worst area of stenosis at L5-S1 and this causes the most severe compromise of the right-sided S1 nerve rootwhich is the main pain producer for her at this time. In light of her medical condition I believe that a limited operation to decompress and stabilize L5-S1 would be in her best interest. I discussed this and presented to the patient today she is a uric get on with some interventional give her some relief. We will plan on scheduling her surgery for this coming afternoon Friday the 17th.

## 2017-06-08 NOTE — Progress Notes (Signed)
Physical Therapy Treatment Patient Details Name: [name] MRN: [id number] DOB: [birth year] Today's Date: 06/08/2017    History of Present Illness [name] is a 61 y.o. female with medical history Asthma, COPD, hypertension, CAD status post MI, history of PE on Coumadin, chronic back pain, admitted with right low back pain and L4-S1 stenosis s/p steroid injection S1 06/04/17.    PT Comments    Pt slowly progressing with mobility, remains limited by pain. Able to transfer, amb, and perform standing balance with RW and min guard for balance. Per neurosurgery, is scheduled for an operation tomorrow (06/09/17) to decompress and stabilize L5-S1. Pt anxious regarding this and continued recovery; educ on importance of mobility and need for continued acute PT post-sx. Will continue to follow acutely.   Follow Up Recommendations  No PT follow up;Supervision - Intermittent     Equipment Recommendations  Rolling walker with 5" wheels    Recommendations for Other Services       Precautions / Restrictions Precautions Precautions: Fall Restrictions Weight Bearing Restrictions: No    Mobility  Bed Mobility Overal bed mobility: Needs Assistance Bed Mobility: Rolling;Sidelying to Sit Rolling: Min assist         General bed mobility comments: Heavily reliant on bed rails with increased time for log roll into sitting; minA only to assist RLE off bed.  Transfers Overall transfer level: Needs assistance Equipment used: Rolling walker (2 wheeled) Transfers: Sit to/from Stand Sit to Stand: Min guard         General transfer comment: Stood with RW and min guard for safety; verbal cues for technique with RW. Stood x5 min for self-pericare using tongs and towels  Ambulation/Gait Ambulation/Gait assistance: Min guard Ambulation Distance (Feet): 5 Feet Assistive device: Rolling walker (2 wheeled) Gait Pattern/deviations: Step-to pattern;Decreased stride length;Wide base of  support;Trunk flexed     General Gait Details: Amb to bedside chair with RW and min guard for balance; further mob limited secondary to significant pain.    Stairs            Wheelchair Mobility    Modified Rankin (Stroke Patients Only)       Balance Overall balance assessment: Needs assistance Sitting-balance support: No upper extremity supported;Feet unsupported Sitting balance-Leahy Scale: Good     Standing balance support: Single extremity supported;Bilateral upper extremity supported;During functional activity Standing balance-Leahy Scale: Poor Standing balance comment: Reliant on single UE support on RW while using other UE for pericare                            Cognition Arousal/Alertness: Awake/alert Behavior During Therapy: WFL for tasks assessed/performed Overall Cognitive Status: Within Functional Limits for tasks assessed                                        Exercises      General Comments        Pertinent Vitals/Pain Pain Assessment: 0-10 Pain Score: 8  Pain Location: back to RLE Pain Descriptors / Indicators: Sharp;Shooting;Radiating Pain Intervention(s): Limited activity within patient's tolerance;Monitored during session;Premedicated before session;Repositioned    Home Living                      Prior Function            PT Goals (current goals can now  be found in the care plan section) Acute Rehab PT Goals Patient Stated Goal: decrease pain  PT Goal Formulation: With patient Time For Goal Achievement: 06/19/17 Potential to Achieve Goals: Good Progress towards PT goals: Progressing toward goals    Frequency    Min 2X/week      PT Plan Current plan remains appropriate    Co-evaluation              AM-PAC PT "6 Clicks" Daily Activity  Outcome Measure  Difficulty turning over in bed (including adjusting bedclothes, sheets and blankets)?: None Difficulty moving from lying on  back to sitting on the side of the bed? : Total Difficulty sitting down on and standing up from a chair with arms (e.g., wheelchair, bedside commode, etc,.)?: A Little Help needed moving to and from a bed to chair (including a wheelchair)?: A Little Help needed walking in hospital room?: A Little Help needed climbing 3-5 steps with a railing? : A Lot 6 Click Score: 16    End of Session Equipment Utilized During Treatment: Gait belt Activity Tolerance: Patient limited by pain Patient left: in chair;with call bell/phone within reach Nurse Communication: Mobility status;Other (comment) (No need for pure wick catheter) PT Visit Diagnosis: Pain;Other symptoms and signs involving the nervous system (R29.898);Other abnormalities of gait and mobility (R26.89);Difficulty in walking, not elsewhere classified (R26.2);Muscle weakness (generalized) (M62.81) Pain - part of body:  (back)     Time: 4709-2957 PT Time Calculation (min) (ACUTE ONLY): 29 min  Charges:  $Therapeutic Activity: 23-37 mins                    G Codes:      Mabeline Caras, PT, DPT [phone] Acute Rehab  Derry Lory 06/08/2017, 5:47 PM

## 2017-06-08 NOTE — Care Management (Signed)
Case management following for discharge planning needs.

## 2017-06-09 ENCOUNTER — Inpatient Hospital Stay (HOSPITAL_COMMUNITY): Payer: Medicare Other

## 2017-06-09 ENCOUNTER — Inpatient Hospital Stay (HOSPITAL_COMMUNITY): Payer: Medicare Other | Admitting: Anesthesiology

## 2017-06-09 ENCOUNTER — Encounter (HOSPITAL_COMMUNITY)
Admission: EM | Disposition: A | Discharge status: Home Health | Payer: Self-pay | Source: Home / Self Care | Attending: Internal Medicine

## 2017-06-09 LAB — CBC
HCT: 44.4 % (ref 36.0–46.0)
Hemoglobin: 14.1 g/dL (ref 12.0–15.0)
MCH: 26.9 pg (ref 26.0–34.0)
MCHC: 31.8 g/dL (ref 30.0–36.0)
MCV: 84.6 fL (ref 78.0–100.0)
PLATELETS: 346 10*3/uL (ref 150–400)
RBC: 5.25 MIL/uL — ABNORMAL HIGH (ref 3.87–5.11)
RDW: 17 % — AB (ref 11.5–15.5)
WBC: 12.7 10*3/uL — AB (ref 4.0–10.5)

## 2017-06-09 LAB — BASIC METABOLIC PANEL
Anion gap: 10 (ref 5–15)
BUN: 9 mg/dL (ref 6–20)
CALCIUM: 9.6 mg/dL (ref 8.9–10.3)
CO2: 28 mmol/L (ref 22–32)
CREATININE: 0.8 mg/dL (ref 0.44–1.00)
Chloride: 101 mmol/L (ref 101–111)
GFR calc Af Amer: >60 mL/min (ref >60)
GLUCOSE: 104 mg/dL — AB (ref 65–99)
Potassium: 4.5 mmol/L (ref 3.5–5.1)
Sodium: 139 mmol/L (ref 135–145)

## 2017-06-09 LAB — SURGICAL PCR SCREEN
MRSA, PCR: NEGATIVE
Staphylococcus aureus: NEGATIVE

## 2017-06-09 LAB — TYPE AND SCREEN
ABO/RH(D): A NEG
Antibody Screen: NEGATIVE

## 2017-06-09 SURGERY — POSTERIOR LUMBAR FUSION 1 LEVEL
Anesthesia: General | Site: Spine Lumbar

## 2017-06-09 MED ORDER — BUPIVACAINE HCL (PF) 0.5 % IJ SOLN
INTRAMUSCULAR | Status: AC
  Filled 2017-06-09: qty 30

## 2017-06-09 MED ORDER — SENNA 8.6 MG PO TABS
1.0000 | ORAL_TABLET | Freq: Two times a day (BID) | ORAL | Status: DC
  Administered 2017-06-09 – 2017-06-16 (×14): 8.6 mg via ORAL
  Filled 2017-06-09 (×14): qty 1

## 2017-06-09 MED ORDER — KETOROLAC TROMETHAMINE 30 MG/ML IJ SOLN
INTRAMUSCULAR | Status: AC
  Filled 2017-06-09: qty 1

## 2017-06-09 MED ORDER — LACTATED RINGERS IV SOLN: INTRAVENOUS | Status: DC

## 2017-06-09 MED ORDER — ROCURONIUM BROMIDE 10 MG/ML (PF) SYRINGE
PREFILLED_SYRINGE | INTRAVENOUS | Status: AC
  Filled 2017-06-09: qty 5

## 2017-06-09 MED ORDER — SODIUM CHLORIDE 0.9% FLUSH
3.0000 mL | Freq: Two times a day (BID) | INTRAVENOUS | Status: DC
  Administered 2017-06-09 – 2017-06-15 (×12): 3 mL via INTRAVENOUS

## 2017-06-09 MED ORDER — PHENYLEPHRINE 40 MCG/ML (10ML) SYRINGE FOR IV PUSH (FOR BLOOD PRESSURE SUPPORT)
PREFILLED_SYRINGE | INTRAVENOUS | Status: AC
  Filled 2017-06-09: qty 10

## 2017-06-09 MED ORDER — SUGAMMADEX SODIUM 200 MG/2ML IV SOLN
INTRAVENOUS | Status: AC
  Filled 2017-06-09: qty 2

## 2017-06-09 MED ORDER — EPHEDRINE 5 MG/ML INJ
INTRAVENOUS | Status: AC
  Filled 2017-06-09: qty 10

## 2017-06-09 MED ORDER — SODIUM CHLORIDE 0.9 % IV SOLN: 250.0000 mL | INTRAVENOUS | Status: DC

## 2017-06-09 MED ORDER — 0.9 % SODIUM CHLORIDE (POUR BTL) OPTIME
TOPICAL | Status: DC | PRN
  Administered 2017-06-09: 1000 mL

## 2017-06-09 MED ORDER — ROCURONIUM BROMIDE 100 MG/10ML IV SOLN
INTRAVENOUS | Status: DC | PRN
  Administered 2017-06-09: 20 mg via INTRAVENOUS
  Administered 2017-06-09: 50 mg via INTRAVENOUS
  Administered 2017-06-09 (×3): 20 mg via INTRAVENOUS
  Administered 2017-06-09: 30 mg via INTRAVENOUS

## 2017-06-09 MED ORDER — ONDANSETRON HCL 4 MG PO TABS
4.0000 mg | ORAL_TABLET | Freq: Four times a day (QID) | ORAL | Status: DC | PRN
  Administered 2017-06-14: 4 mg via ORAL
  Filled 2017-06-09 (×2): qty 1

## 2017-06-09 MED ORDER — LACTATED RINGERS IV SOLN
INTRAVENOUS | Status: DC
  Administered 2017-06-09 (×3): via INTRAVENOUS

## 2017-06-09 MED ORDER — THROMBIN 5000 UNITS EX SOLR
CUTANEOUS | Status: AC
  Filled 2017-06-09: qty 5000

## 2017-06-09 MED ORDER — CEFAZOLIN SODIUM-DEXTROSE 2-4 GM/100ML-% IV SOLN
2.0000 g | Freq: Three times a day (TID) | INTRAVENOUS | Status: AC
  Administered 2017-06-09 – 2017-06-10 (×2): 2 g via INTRAVENOUS
  Filled 2017-06-09 (×2): qty 100

## 2017-06-09 MED ORDER — NEOSTIGMINE METHYLSULFATE 5 MG/5ML IV SOSY
PREFILLED_SYRINGE | INTRAVENOUS | Status: AC
  Filled 2017-06-09: qty 5

## 2017-06-09 MED ORDER — MIDAZOLAM HCL 2 MG/2ML IJ SOLN
INTRAMUSCULAR | Status: AC
  Filled 2017-06-09: qty 2

## 2017-06-09 MED ORDER — DEXAMETHASONE SODIUM PHOSPHATE 10 MG/ML IJ SOLN
INTRAMUSCULAR | Status: DC | PRN
  Administered 2017-06-09: 10 mg via INTRAVENOUS

## 2017-06-09 MED ORDER — ROCURONIUM BROMIDE 10 MG/ML (PF) SYRINGE
PREFILLED_SYRINGE | INTRAVENOUS | Status: AC
  Filled 2017-06-09: qty 10

## 2017-06-09 MED ORDER — BISACODYL 10 MG RE SUPP: 10.0000 mg | Freq: Every day | RECTAL | Status: DC | PRN

## 2017-06-09 MED ORDER — LIDOCAINE HCL (CARDIAC) 20 MG/ML IV SOLN
INTRAVENOUS | Status: DC | PRN
  Administered 2017-06-09: 100 mg via INTRATRACHEAL

## 2017-06-09 MED ORDER — HYDROCODONE-ACETAMINOPHEN 5-325 MG PO TABS
1.0000 | ORAL_TABLET | ORAL | Status: DC | PRN
  Administered 2017-06-09 – 2017-06-15 (×24): 2 via ORAL
  Filled 2017-06-09 (×26): qty 2

## 2017-06-09 MED ORDER — ONDANSETRON HCL 4 MG/2ML IJ SOLN
INTRAMUSCULAR | Status: AC
  Filled 2017-06-09: qty 2

## 2017-06-09 MED ORDER — SUCCINYLCHOLINE CHLORIDE 20 MG/ML IJ SOLN
INTRAMUSCULAR | Status: DC | PRN
  Administered 2017-06-09: 120 mg via INTRAVENOUS

## 2017-06-09 MED ORDER — ONDANSETRON HCL 4 MG/2ML IJ SOLN
4.0000 mg | Freq: Four times a day (QID) | INTRAMUSCULAR | Status: DC | PRN
  Administered 2017-06-15: 4 mg via INTRAVENOUS
  Filled 2017-06-09: qty 2

## 2017-06-09 MED ORDER — CEFAZOLIN SODIUM-DEXTROSE 2-4 GM/100ML-% IV SOLN
INTRAVENOUS | Status: AC
  Filled 2017-06-09: qty 100

## 2017-06-09 MED ORDER — ONDANSETRON HCL 4 MG/2ML IJ SOLN
INTRAMUSCULAR | Status: AC
Start: 2017-06-09 — End: 2017-06-09
  Filled 2017-06-09: qty 2

## 2017-06-09 MED ORDER — PHENYLEPHRINE HCL 10 MG/ML IJ SOLN
INTRAVENOUS | Status: DC | PRN
  Administered 2017-06-09: 25 ug/min via INTRAVENOUS

## 2017-06-09 MED ORDER — LIDOCAINE-EPINEPHRINE 1 %-1:100000 IJ SOLN
INTRAMUSCULAR | Status: DC | PRN
  Administered 2017-06-09: 5 mL

## 2017-06-09 MED ORDER — ALUM & MAG HYDROXIDE-SIMETH 200-200-20 MG/5ML PO SUSP: 30.0000 mL | Freq: Four times a day (QID) | ORAL | Status: DC | PRN

## 2017-06-09 MED ORDER — FENTANYL CITRATE (PF) 250 MCG/5ML IJ SOLN
INTRAMUSCULAR | Status: DC | PRN
  Administered 2017-06-09: 100 ug via INTRAVENOUS
  Administered 2017-06-09: 150 ug via INTRAVENOUS
  Administered 2017-06-09: 50 ug via INTRAVENOUS

## 2017-06-09 MED ORDER — SUGAMMADEX SODIUM 500 MG/5ML IV SOLN
INTRAVENOUS | Status: DC | PRN
  Administered 2017-06-09: 350 mg via INTRAVENOUS

## 2017-06-09 MED ORDER — LIDOCAINE-EPINEPHRINE 1 %-1:100000 IJ SOLN
INTRAMUSCULAR | Status: AC
  Filled 2017-06-09: qty 1

## 2017-06-09 MED ORDER — LIDOCAINE 2% (20 MG/ML) 5 ML SYRINGE
INTRAMUSCULAR | Status: AC
  Filled 2017-06-09: qty 5

## 2017-06-09 MED ORDER — ACETAMINOPHEN 650 MG RE SUPP: 650.0000 mg | RECTAL | Status: DC | PRN

## 2017-06-09 MED ORDER — PROPOFOL 10 MG/ML IV BOLUS
INTRAVENOUS | Status: AC
  Filled 2017-06-09: qty 20

## 2017-06-09 MED ORDER — PHENOL 1.4 % MT LIQD: 1.0000 | OROMUCOSAL | Status: DC | PRN

## 2017-06-09 MED ORDER — DEXTROSE 5 % IV SOLN
3.0000 g | Freq: Once | INTRAVENOUS | Status: AC
  Administered 2017-06-09: 3 g via INTRAVENOUS
  Filled 2017-06-09: qty 3000

## 2017-06-09 MED ORDER — GELATIN ABSORBABLE MT POWD
OROMUCOSAL | Status: DC | PRN
  Administered 2017-06-09 (×2): 5 mL via TOPICAL

## 2017-06-09 MED ORDER — FENTANYL CITRATE (PF) 250 MCG/5ML IJ SOLN
INTRAMUSCULAR | Status: AC
  Filled 2017-06-09: qty 5

## 2017-06-09 MED ORDER — PROPOFOL 10 MG/ML IV BOLUS
INTRAVENOUS | Status: DC | PRN
  Administered 2017-06-09: 150 mg via INTRAVENOUS

## 2017-06-09 MED ORDER — FLEET ENEMA 7-19 GM/118ML RE ENEM: 1.0000 | ENEMA | Freq: Once | RECTAL | Status: DC | PRN

## 2017-06-09 MED ORDER — MENTHOL 3 MG MT LOZG
1.0000 | LOZENGE | OROMUCOSAL | Status: DC | PRN
  Administered 2017-06-11: 3 mg via ORAL
  Filled 2017-06-09: qty 9

## 2017-06-09 MED ORDER — SODIUM CHLORIDE 0.9 % IR SOLN
Status: DC | PRN
  Administered 2017-06-09: 500 mL

## 2017-06-09 MED ORDER — PHENYLEPHRINE HCL 10 MG/ML IJ SOLN
INTRAMUSCULAR | Status: DC | PRN
  Administered 2017-06-09: 40 ug via INTRAVENOUS
  Administered 2017-06-09 (×4): 80 ug via INTRAVENOUS

## 2017-06-09 MED ORDER — HYDROMORPHONE HCL 1 MG/ML IJ SOLN
0.5000 mg | INTRAMUSCULAR | Status: DC | PRN
  Administered 2017-06-09 – 2017-06-10 (×8): 0.5 mg via INTRAVENOUS
  Filled 2017-06-09 (×8): qty 1

## 2017-06-09 MED ORDER — SUCCINYLCHOLINE CHLORIDE 200 MG/10ML IV SOSY
PREFILLED_SYRINGE | INTRAVENOUS | Status: AC
  Filled 2017-06-09: qty 10

## 2017-06-09 MED ORDER — METHOCARBAMOL 1000 MG/10ML IJ SOLN
500.0000 mg | Freq: Four times a day (QID) | INTRAMUSCULAR | Status: DC | PRN
  Filled 2017-06-09: qty 5

## 2017-06-09 MED ORDER — LIDOCAINE 2% (20 MG/ML) 5 ML SYRINGE
INTRAMUSCULAR | Status: AC
  Filled 2017-06-09: qty 10

## 2017-06-09 MED ORDER — ONDANSETRON HCL 4 MG/2ML IJ SOLN
INTRAMUSCULAR | Status: DC | PRN
  Administered 2017-06-09: 4 mg via INTRAVENOUS

## 2017-06-09 MED ORDER — ACETAMINOPHEN 325 MG PO TABS: 650.0000 mg | ORAL_TABLET | ORAL | Status: DC | PRN

## 2017-06-09 MED ORDER — SODIUM CHLORIDE 0.9% FLUSH
3.0000 mL | INTRAVENOUS | Status: DC | PRN
  Administered 2017-06-15 – 2017-06-16 (×2): 3 mL via INTRAVENOUS
  Filled 2017-06-09 (×3): qty 3

## 2017-06-09 MED ORDER — DOCUSATE SODIUM 100 MG PO CAPS
100.0000 mg | ORAL_CAPSULE | Freq: Two times a day (BID) | ORAL | Status: DC
  Administered 2017-06-09 – 2017-06-16 (×14): 100 mg via ORAL
  Filled 2017-06-09 (×14): qty 1

## 2017-06-09 MED ORDER — POLYETHYLENE GLYCOL 3350 17 G PO PACK
17.0000 g | PACK | Freq: Every day | ORAL | Status: DC | PRN
  Administered 2017-06-10: 17 g via ORAL
  Filled 2017-06-09: qty 1

## 2017-06-09 MED ORDER — ALBUTEROL SULFATE HFA 108 (90 BASE) MCG/ACT IN AERS
INHALATION_SPRAY | RESPIRATORY_TRACT | Status: DC | PRN
  Administered 2017-06-09: 3 via RESPIRATORY_TRACT

## 2017-06-09 MED ORDER — METHOCARBAMOL 500 MG PO TABS
500.0000 mg | ORAL_TABLET | Freq: Four times a day (QID) | ORAL | Status: DC | PRN
  Administered 2017-06-10 – 2017-06-16 (×11): 500 mg via ORAL
  Filled 2017-06-09 (×11): qty 1

## 2017-06-09 MED ORDER — HYDROMORPHONE HCL 1 MG/ML IJ SOLN: 0.2500 mg | INTRAMUSCULAR | Status: DC | PRN

## 2017-06-09 MED ORDER — BUPIVACAINE HCL (PF) 0.5 % IJ SOLN
INTRAMUSCULAR | Status: DC | PRN
Start: 2017-06-09 — End: 2017-06-09
  Administered 2017-06-09: 20 mL
  Administered 2017-06-09: 5 mL

## 2017-06-09 MED ORDER — MIDAZOLAM HCL 5 MG/5ML IJ SOLN
INTRAMUSCULAR | Status: DC | PRN
  Administered 2017-06-09: 2 mg via INTRAVENOUS

## 2017-06-09 MED ORDER — THROMBIN 20000 UNITS EX SOLR
CUTANEOUS | Status: DC | PRN
  Administered 2017-06-09: 20 mL via TOPICAL

## 2017-06-09 MED ORDER — THROMBIN 20000 UNITS EX SOLR
CUTANEOUS | Status: AC
  Filled 2017-06-09: qty 20000

## 2017-06-09 SURGICAL SUPPLY — 76 items
ADH SKN CLS APL DERMABOND .7 (GAUZE/BANDAGES/DRESSINGS) ×1
APL SRG 60D 8 XTD TIP BNDBL (TIP)
BAG DECANTER FOR FLEXI CONT (MISCELLANEOUS) ×2 IMPLANT
BASKET BONE COLLECTION (BASKET) ×2 IMPLANT
BLADE CLIPPER SURG (BLADE) IMPLANT
BONE CANC CHIPS 20CC PCAN1/4 (Bone Implant) ×2 IMPLANT
BUR MATCHSTICK NEURO 3.0 LAGG (BURR) ×2 IMPLANT
CAGE COROENT 11X9X23-8 (Cage) ×1 IMPLANT
CANISTER SUCT 3000ML PPV (MISCELLANEOUS) ×2 IMPLANT
CARTRIDGE OIL MAESTRO DRILL (MISCELLANEOUS) ×1 IMPLANT
CHIPS CANC BONE 20CC PCAN1/4 (Bone Implant) ×1 IMPLANT
CONT SPEC 4OZ CLIKSEAL STRL BL (MISCELLANEOUS) ×2 IMPLANT
COVER BACK TABLE 60X90IN (DRAPES) ×2 IMPLANT
DECANTER SPIKE VIAL GLASS SM (MISCELLANEOUS) ×1 IMPLANT
DERMABOND ADVANCED (GAUZE/BANDAGES/DRESSINGS) ×1
DERMABOND ADVANCED .7 DNX12 (GAUZE/BANDAGES/DRESSINGS) ×1 IMPLANT
DEVICE DISSECT PLASMABLAD 3.0S (MISCELLANEOUS) ×1 IMPLANT
DIFFUSER DRILL AIR PNEUMATIC (MISCELLANEOUS) ×1 IMPLANT
DRAPE C-ARM 42X72 X-RAY (DRAPES) ×4 IMPLANT
DRAPE HALF SHEET 40X57 (DRAPES) IMPLANT
DRAPE LAPAROTOMY 100X72X124 (DRAPES) ×2 IMPLANT
DRAPE POUCH INSTRU U-SHP 10X18 (DRAPES) ×2 IMPLANT
DRSG OPSITE POSTOP 4X8 (GAUZE/BANDAGES/DRESSINGS) ×1 IMPLANT
DURAPREP 26ML APPLICATOR (WOUND CARE) ×2 IMPLANT
DURASEAL APPLICATOR TIP (TIP) IMPLANT
DURASEAL SPINE SEALANT 3ML (MISCELLANEOUS) IMPLANT
ELECT REM PT RETURN 9FT ADLT (ELECTROSURGICAL) ×2
ELECTRODE REM PT RTRN 9FT ADLT (ELECTROSURGICAL) ×1 IMPLANT
GAUZE SPONGE 4X4 12PLY STRL (GAUZE/BANDAGES/DRESSINGS) ×1 IMPLANT
GAUZE SPONGE 4X4 16PLY XRAY LF (GAUZE/BANDAGES/DRESSINGS) IMPLANT
GLOVE BIOGEL PI IND STRL 6.5 (GLOVE) IMPLANT
GLOVE BIOGEL PI IND STRL 7.0 (GLOVE) IMPLANT
GLOVE BIOGEL PI IND STRL 7.5 (GLOVE) IMPLANT
GLOVE BIOGEL PI IND STRL 8.5 (GLOVE) ×2 IMPLANT
GLOVE BIOGEL PI INDICATOR 6.5 (GLOVE) ×2
GLOVE BIOGEL PI INDICATOR 7.0 (GLOVE) ×2
GLOVE BIOGEL PI INDICATOR 7.5 (GLOVE) ×1
GLOVE BIOGEL PI INDICATOR 8.5 (GLOVE) ×2
GLOVE ECLIPSE 8.5 STRL (GLOVE) ×2 IMPLANT
GLOVE SURG SS PI 6.5 STRL IVOR (GLOVE) ×3 IMPLANT
GLOVE SURG SS PI 8.5 STRL IVOR (GLOVE) ×2
GLOVE SURG SS PI 8.5 STRL STRW (GLOVE) IMPLANT
GOWN STRL REUS W/ TWL LRG LVL3 (GOWN DISPOSABLE) IMPLANT
GOWN STRL REUS W/ TWL XL LVL3 (GOWN DISPOSABLE) IMPLANT
GOWN STRL REUS W/TWL 2XL LVL3 (GOWN DISPOSABLE) ×4 IMPLANT
GOWN STRL REUS W/TWL LRG LVL3 (GOWN DISPOSABLE) ×6
GOWN STRL REUS W/TWL XL LVL3 (GOWN DISPOSABLE) ×2
GRAFT BNE CANC CHIPS 1-8 20CC (Bone Implant) IMPLANT
HEMOSTAT POWDER KIT SURGIFOAM (HEMOSTASIS) ×2 IMPLANT
KIT BASIN OR (CUSTOM PROCEDURE TRAY) ×2 IMPLANT
KIT INFUSE SMALL (Orthopedic Implant) ×1 IMPLANT
KIT ROOM TURNOVER OR (KITS) ×2 IMPLANT
MILL MEDIUM DISP (BLADE) ×1 IMPLANT
NEEDLE HYPO 22GX1.5 SAFETY (NEEDLE) ×2 IMPLANT
NS IRRIG 1000ML POUR BTL (IV SOLUTION) ×2 IMPLANT
OIL CARTRIDGE MAESTRO DRILL (MISCELLANEOUS)
PACK LAMINECTOMY NEURO (CUSTOM PROCEDURE TRAY) ×2 IMPLANT
PAD ARMBOARD 7.5X6 YLW CONV (MISCELLANEOUS) ×10 IMPLANT
PATTIES SURGICAL .5 X1 (DISPOSABLE) ×2 IMPLANT
PLASMABLADE 3.0S (MISCELLANEOUS) ×2
ROD RELINE-O LORD 5.5X35MM (Rod) ×2 IMPLANT
SCREW LOCK RELINE 5.5 TULIP (Screw) ×4 IMPLANT
SCREW RELINE-O POLY 7.5X40 (Screw) ×2 IMPLANT
SCREW RELINE-O POLY 7.5X45 (Screw) ×2 IMPLANT
SPONGE LAP 4X18 X RAY DECT (DISPOSABLE) IMPLANT
SPONGE SURGIFOAM ABS GEL 100 (HEMOSTASIS) ×2 IMPLANT
SUT PROLENE 6 0 BV (SUTURE) ×1 IMPLANT
SUT VIC AB 1 CT1 18XBRD ANBCTR (SUTURE) ×1 IMPLANT
SUT VIC AB 1 CT1 8-18 (SUTURE) ×2
SUT VIC AB 2-0 CP2 18 (SUTURE) ×2 IMPLANT
SUT VIC AB 3-0 SH 8-18 (SUTURE) ×3 IMPLANT
SYR 3ML LL SCALE MARK (SYRINGE) ×8 IMPLANT
TOWEL GREEN STERILE (TOWEL DISPOSABLE) ×2 IMPLANT
TOWEL GREEN STERILE FF (TOWEL DISPOSABLE) ×2 IMPLANT
TRAY FOLEY W/METER SILVER 16FR (SET/KITS/TRAYS/PACK) ×2 IMPLANT
WATER STERILE IRR 1000ML POUR (IV SOLUTION) ×2 IMPLANT

## 2017-06-09 NOTE — Transfer of Care (Signed)
Immediate Anesthesia Transfer of Care Note  Patient: [name]  Procedure(s) Performed: Procedure(s): Lumbar Five-Sacral One Decompression/Posterior lumbar interbody fusion (N/A)  Patient Location: PACU  Anesthesia Type:General  Level of Consciousness: drowsy and patient cooperative  Airway & Oxygen Therapy: Patient Spontanous Breathing and Patient connected to face mask oxygen  Post-op Assessment: Report given to RN, Post -op Vital signs reviewed and stable and Patient moving all extremities X 4  Post vital signs: Reviewed and stable  Last Vitals:  Vitals:   06/09/17 1625 06/09/17 1630  BP: 120/71 120/71  Pulse: 83 91  Resp: 11 (!) 21  Temp: 37.3 C   SpO2: 100% 97%    Last Pain:  Vitals:   06/09/17 0832  TempSrc:   PainSc: 8       Patients Stated Pain Goal: 1 (57/90/38 3338)  Complications: No apparent anesthesia complications

## 2017-06-09 NOTE — Anesthesia Postprocedure Evaluation (Signed)
Anesthesia Post Note  Patient: KAIJA KOVACEVIC  Procedure(s) Performed: Procedure(s) (LRB): Lumbar Five-Sacral One Decompression/Posterior lumbar interbody fusion (N/A)     Patient location during evaluation: PACU Anesthesia Type: General Level of consciousness: awake and alert Pain management: pain level controlled Vital Signs Assessment: post-procedure vital signs reviewed and stable Respiratory status: spontaneous breathing, nonlabored ventilation, respiratory function stable and patient connected to nasal cannula oxygen Cardiovascular status: blood pressure returned to baseline and stable Postop Assessment: no signs of nausea or vomiting Anesthetic complications: no    Last Vitals:  Vitals:   06/09/17 1645 06/09/17 1700  BP: 109/73 117/75  Pulse: 82 79  Resp: 13 14  Temp:    SpO2: 98% 96%    Last Pain:  Vitals:   06/09/17 1625  TempSrc:   PainSc: 0-No pain                 Effie Berkshire

## 2017-06-09 NOTE — Anesthesia Procedure Notes (Signed)
Procedure Name: Intubation Date/Time: 06/09/2017 12:36 PM Performed by: Mariea Clonts Pre-anesthesia Checklist: Patient identified, Emergency Drugs available, Suction available and Patient being monitored Patient Re-evaluated:Patient Re-evaluated prior to induction Oxygen Delivery Method: Circle System Utilized Preoxygenation: Pre-oxygenation with 100% oxygen Induction Type: IV induction Ventilation: Mask ventilation without difficulty and Oral airway inserted - appropriate to patient size Laryngoscope Size: Glidescope, Miller and 2 Grade View: Grade III Tube type: Oral Tube size: 7.5 mm Number of attempts: 2 Airway Equipment and Method: Stylet,  Oral airway and Video-laryngoscopy Placement Confirmation: ETT inserted through vocal cords under direct vision,  positive ETCO2 and breath sounds checked- equal and bilateral Tube secured with: Tape Dental Injury: Teeth and Oropharynx as per pre-operative assessment

## 2017-06-09 NOTE — Op Note (Signed)
Date of surgery: 06/09/2017 Preoperative diagnosis: L5-S1 spondylosis with right lumbar radiculopathy status post arthrodesis L4-L5 using Steffee hardware Postoperative diagnosis: Same Procedure: Removal of Steffee hardware L4-L5, decompression of L5-S1 via laminectomy and total discectomy with posterior lumbar interbody arthrodesis using peek spacer local autograft and allograft pedicle screw fixation L5-S1 posterior lateral arthrodesis with local autograft and allograft L5-S1. Surgeon: Kristeen Miss First assistant: Deri Fuelling M.D. Anesthesia: Gen. endotracheal Indications: [name] is a 61 year old individual who's had a previous decompression fusion L4-L5 back in 1989 she has developed a severe right lumbar radiculopathy with weakness in the gastroc and the gluteus on the right side to underwent myelography to determine the degree and nature stenosis at this level is been advised regarding surgery to decompress and stabilize L5-S1.  Procedure: Patient was brought to the operating room supine on a stretcher. After the smooth induction of general endotracheal anesthesia, she was turned prone. The back was prepped with alcohol and DuraPrep and draped in a sterile fashion. Previously made midline incision was reopened and the dissection was carried down to the lumbar dorsal fascia which was opened on either side of the midline. The paraspinous muscles have largely been replaced by fatty tissue. Dissection was carried out over the previously placed Steffee hardware and this was isolated. Then using a series of his from the Steffee hardware set the locking caps were removed the locking screws were removed and the resting notes were removed the plate was removed and the screws removed first on the right side than on the left. Once the hardware was out the dissection was continued inferiorly and laminectomy of L5 was completed. Common dural tube was identified and dissection was carried out to the left  side to isolate the S1 nerve root and the L5 nerve root superiorly. The disc space was isolated with a 2 and 3 mm Kerrison punch being used to go away large amounts of overgrown bone and redundant ligament with this the left side was decompressed and here there was noted to be a severely calcified disc at the level of the L5-S1 space elevating and compressing the S1 nerve root was carefully decompressed dorsally first and then from underneath by her removing the calcified disc and entering the disc space and doing a complete discectomy. Is noted that the bone on the inferior endplate of L5 was noted be rather soft. The endplates were decorticated carefully and ultimately it 10 mm tall 8 lordotic 28 mm long spacer was placed into the interspace packed with autograft allograft into strips of infuse. A total of 6 mL of infuse was packed into the disc space. The grafting in the spacer was only performed and laterally on the right side as the left side also had a substantially calcified disc but no evidence of foraminal narrowing along the S1 nerve root. With this pedicle entry sites were chosen and the sacrum and 7.5 x 40 mm screws were placed into the S1 vertebrae. Then 7.5 x 45 mm screws were placed in L5 through the previously drilled holes. 41mm precontoured connecting rods were used to connect the screw heads the prior to this lateral gutters were decorticated and packed with infuse and autograft allograft combination for a total of 6 mL in either lateral gutter. With pedicle screws being placed well and checked radiographically 70mm rods were placed in the system was tightened to torque expect. With this final radiographs were obtained and when the area was inspected and checked that the L5 and the S1  nerve roots were well decompressed and hemostasis was achieved then the lumbar dorsal fascia was closed with #1 Vicryl 2-0 Vicryl was used in subcutaneous tissues and 3-0 Vicryl subcuticularly. Dermabond was used on  the skin along with a honeycomb dressing. Blood loss was estimated 500 mL and then 125 mL of Cell Saver blood was returned.

## 2017-06-09 NOTE — Progress Notes (Signed)
PROGRESS NOTE  GENNAVIEVE HUQ HKV:[id number] DOB: 22-Dec-1955 DOA: 06/02/2017   PCP: Thressa Sheller, MD  Brief Narrative: 61 year old woman PMH COPD, coronary artery and a disease, pulmonary embolism on warfarin, chronic back pain presented with worsening back pain, right leg sciatica and intermittent paresthesia. Admitted for intractable pain, neurosurgery evaluation.  Assessment/Plan  Intractable lower back pain - with associated sciatica and intermittent paresthesias, secondary to spinal stenosis pain.  - Status post transforaminal block with minimal response 8/13 - per neurosurgery, worse area of stenosis is at L5-S1 causing severe compromise of the right sided Sq nerve root - plan for limited operation to decompress and stabilization L5-S1 today  - analgesia as needed   Dyspnea this AM - CXR with no evidence of PNA - respiratory status stable this AM  - continue home regimen with Lasix  PMH pulmonary embolism, DVT - Warfarin on hold pending procedure - resume AC when neurosurgery clears   PMH chronic UTI, asymptomatic bacteriuria - Patient asymptomatic. Continue trimethoprim.  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 - BD's as needed   Essential hypertension - overall stable   Morbid obesity class III. - Body mass index is 49.21 kg/m.  DVT prophylaxis: SCD's Code Status: Full  Family Communication: pt at bedside  Disposition Plan: to be determined but likely home   Consultants:  Neurosurgery  IR  Procedures:  8/12 Right S1 nerve root epidural steroid injection performed for Right S1 radiculopathy secondary to stenosis at L5/S1  Antimicrobials:  Trimethoprim   Subjective: No chest pain or dyspnea this AM.  Objective: Vitals:   06/08/17 1056 06/08/17 1500 06/08/17 2033 06/09/17 0358  BP: (!) 148/72 128/64 122/83 137/80  Pulse:  67 60 67  Resp:  18 18 18   Temp:  98.4 F (36.9 C) 97.6 F (36.4 C) 97.7 F  (36.5 C)  TempSrc:  Oral Oral Oral  SpO2:  96% 98% 98%  Weight:        Intake/Output Summary (Last 24 hours) at 06/09/17 1017 Last data filed at 06/09/17 0900  Gross per 24 hour  Intake              480 ml  Output             2300 ml  Net            -1820 ml   Filed Weights   06/03/17 0300 06/05/17 0500 06/07/17 0500  Weight: 129.7 kg (286 lb) 127.5 kg (281 lb 1.6 oz) 126 kg (277 lb 12.8 oz)    Physical Exam  Constitutional: Appears well-developed and well-nourished. No distress.  CVS: RRR, S1/S2 +, no murmurs, no gallops, no carotid bruit.  Pulmonary: Effort and breath sounds normal, no stridor, rhonchi, wheezes, rales.  Abdominal: Soft. BS +,  no distension, tenderness, rebound or guarding.  Psychiatric: Normal mood and affect. Behavior, judgment, thought content normal.   Data Reviewed: I have personally reviewed following labs and imaging studies  CBC:  Recent Labs Lab 06/03/17 0407 06/07/17 0525 06/09/17 0346  WBC 11.0* 10.5 12.7*  HGB 12.9 12.3 14.1  HCT 42.0 40.0 44.4  MCV 84.3 84.7 84.6  PLT 265 287 564   Basic Metabolic Panel:  Recent Labs Lab 06/03/17 0407 06/07/17 0525 06/09/17 0346  NA 138 140 139  K 3.9 4.0 4.5  CL 101 104 101  CO2 27 26 28   GLUCOSE 107* 116* 104*  BUN 12 7 9   CREATININE 0.81 0.93 0.80  CALCIUM  8.9 8.7* 9.6   Liver Function Tests:  Recent Labs Lab 06/03/17 0407  AST 25  ALT 20  ALKPHOS 64  BILITOT 0.9  PROT 7.2  ALBUMIN 3.4*   Coagulation Profile:  Recent Labs Lab 06/03/17 0407 06/04/17 0510  INR 1.75 1.62   Urine analysis:    Component Value Date/Time   COLORURINE YELLOW 06/02/2017 1440   APPEARANCEUR HAZY (A) 06/02/2017 1440   LABSPEC 1.024 06/02/2017 1440   PHURINE 5.0 06/02/2017 1440   GLUCOSEU NEGATIVE 06/02/2017 1440   HGBUR NEGATIVE 06/02/2017 1440   BILIRUBINUR NEGATIVE 06/02/2017 1440   KETONESUR NEGATIVE 06/02/2017 1440   PROTEINUR NEGATIVE 06/02/2017 1440   UROBILINOGEN 1.0 04/15/2013  2331   NITRITE NEGATIVE 06/02/2017 1440   LEUKOCYTESUR NEGATIVE 06/02/2017 1440    Recent Results (from the past 240 hour(s))  Culture, Urine     Status: Abnormal   Collection Time: 06/02/17  1:00 AM  Result Value Ref Range Status   Specimen Description URINE, CATHETERIZED  Final   Special Requests NONE  Final   Culture (A)  Final    70,000 COLONIES/mL PSEUDOMONAS AERUGINOSA 80,000 COLONIES/mL VIRIDANS STREPTOCOCCUS CALL MICROBIOLOGY LAB IF SENSITIVITIES ARE REQUIRED.    Report Status 06/05/2017 FINAL  Final   Organism ID, Bacteria PSEUDOMONAS AERUGINOSA (A)  Final      Susceptibility   Pseudomonas aeruginosa - MIC*    CEFTAZIDIME 8 SENSITIVE Sensitive     CIPROFLOXACIN <=0.25 SENSITIVE Sensitive     GENTAMICIN <=1 SENSITIVE Sensitive     IMIPENEM 2 SENSITIVE Sensitive     PIP/TAZO 16 SENSITIVE Sensitive     CEFEPIME 4 SENSITIVE Sensitive     * 70,000 COLONIES/mL PSEUDOMONAS AERUGINOSA  Culture, blood (Routine X 2) w Reflex to ID Panel     Status: None   Collection Time: 06/02/17  5:22 PM  Result Value Ref Range Status   Specimen Description BLOOD LEFT ARM  Final   Special Requests   Final    BOTTLES DRAWN AEROBIC AND ANAEROBIC Blood Culture adequate volume   Culture NO GROWTH 5 DAYS  Final   Report Status 06/07/2017 FINAL  Final  Culture, blood (Routine X 2) w Reflex to ID Panel     Status: None   Collection Time: 06/02/17  6:24 PM  Result Value Ref Range Status   Specimen Description BLOOD RIGHT ARM  Final   Special Requests IN PEDIATRIC BOTTLE Blood Culture adequate volume  Final   Culture NO GROWTH 5 DAYS  Final   Report Status 06/07/2017 FINAL  Final      Radiology Studies: Dg Chest 2 View  Result Date: 06/08/2017 CLINICAL DATA:  Shortness of breath.  Chronic chest pain. EXAM: CHEST  2 VIEW COMPARISON:  June 02, 2017 FINDINGS: No pneumothorax. Atelectasis or scar in the left mid lung. No pulmonary nodules or masses. No focal infiltrates. The cardiomediastinal  silhouette is stable. IMPRESSION: Scar or atelectasis in the left mid lung. No other acute abnormalities. Electronically Signed   By: Dorise Bullion III M.D   On: 06/08/2017 21:18   Ct Lumbar Spine W Contrast  Result Date: 06/07/2017 CLINICAL DATA:  Right lumbar radiculopathy. Nondiagnostic MRI of due to hardware artifact. EXAM: LUMBAR MYELOGRAM FLUOROSCOPY TIME:  3 minutes 30 seconds 98.3 mGy air kerma PROCEDURE: Informed written consent was again obtained. Patient knows she is at greater risk that most patients do to flexeril use and recent Coumadin use. She has allergies, but has tolerated IV contrast successfully in the past.  Consent was obtained by Dr. Monte Fantasia. Time out form was completed. Patient was positioned prone on the fluoroscopy table. The spine was localized and prepped and draped in standard sterile fashion. 1% lidocaine was used for skin and subcutaneous tissue anesthesia where needed. A 5 inch 20 gauge spinal needle was used. Puncture was attempted at the L2-3 level, but the space between the lamina could not be found despite multiple exposures (needed to penetrate the soft tissues) and patient repositioning. The L1-2 level was then selected. This projected over the mid L2 body, well below the conus based on prior MRI. Puncture was performed using a 5 inch 20 gauge spinal needle. Initially, CSF was not visualized. Central canal needle tip placement was confirmed in the lateral projection and ultimately low pressure CSF was seen. 15 mL of Isovue 200 M was injected into the thecal sac, with normal opacification of the nerve roots and cauda equina consistent with free flow within the subarachnoid space. I personally performed the lumbar puncture and administered the intrathecal contrast. I also personally supervised acquisition of the myelogram images. TECHNIQUE: Contiguous axial images were obtained through the Lumbar spine after the intrathecal infusion of infusion. Coronal and sagittal  reconstructions were obtained of the axial image sets. COMPARISON:  MRI 06/02/2017 FINDINGS: LUMBAR MYELOGRAM FINDINGS: High exposure study, dedicated images would be low yield in this patient who was unable to position herself without assistance. Post injection exposures were deferred. CT LUMBAR MYELOGRAM FINDINGS: Segmentation:  Normal Alignment: Slight degenerative anterolisthesis at L3-4 and retrolisthesis at L2-3. Vertebrae:  Negative for fracture, bone lesion, or endplate erosion. Conus: Normal size and morphology, tip terminating at the L1 body level. Disc levels: T12- L1: Spondylosis with ankylosing ventral osteophyte. No impingement L1-L2: Spondylosis with ankylosing ventral osteophyte. Tiny calcified right paracentral disc protrusion. No notable facet degenerative change. No impingement. L2-L3: Degenerative disc narrowing with mild endplate ridging. Facet and ligament thickening. Moderate spinal stenosis with crowding of the cauda equina. No discrete herniation or compression. Inferior foraminal effacement without L2 compression. L3-L4: Prominent facet arthropathy, especially on the right. There has been a left-sided partial facetectomy and laminotomy. Spinal stenosis is mild to moderate on the right without discrete compression or herniation. The cauda equina is crowded. Inferior foraminal effacement without L3 compression. L4-L5: Discectomy with solid arthrodesis. No residual impingement. Posterior rod and pedicle screws are in good position. L5-S1:Moderate degenerative disc narrowing and endplate ridging. Facet arthropathy with moderate hypertrophy. The thecal sac is narrow at baseline. Right paracentral endplate ridge causes prominent impingement on the S1 nerve root as it passes in front of the hypertrophic facet. There is mild foraminal narrowing. IMPRESSION: LUMBAR MYELOGRAM IMPRESSION: Successful intrathecal injection at L1-2. CT LUMBAR MYELOGRAM IMPRESSION: 1. L5-S1 right paracentral endplate  ridge which compresses the right S1 nerve root. This spur has been present since at least 2014 abdominal CT. 2. L2-3 mild to moderate spinal stenosis without discrete compression. 3. L3-4 moderate spinal stenosis in the right canal without discrete compression. Better patency of the left canal after a left laminotomy and partial facetectomy. 4. L4-5 discectomy with solid arthrodesis and no residual impingement. 5. No compressive foraminal narrowing. Electronically Signed   By: Monte Fantasia M.D.   On: 06/07/2017 16:30   Dg Myelogram Lumbar  Result Date: 06/07/2017 CLINICAL DATA:  Right lumbar radiculopathy. Nondiagnostic MRI of due to hardware artifact. EXAM: LUMBAR MYELOGRAM FLUOROSCOPY TIME:  3 minutes 30 seconds 98.3 mGy air kerma PROCEDURE: Informed written consent was again obtained. Patient knows  she is at greater risk that most patients do to flexeril use and recent Coumadin use. She has allergies, but has tolerated IV contrast successfully in the past. Consent was obtained by Dr. Monte Fantasia. Time out form was completed. Patient was positioned prone on the fluoroscopy table. The spine was localized and prepped and draped in standard sterile fashion. 1% lidocaine was used for skin and subcutaneous tissue anesthesia where needed. A 5 inch 20 gauge spinal needle was used. Puncture was attempted at the L2-3 level, but the space between the lamina could not be found despite multiple exposures (needed to penetrate the soft tissues) and patient repositioning. The L1-2 level was then selected. This projected over the mid L2 body, well below the conus based on prior MRI. Puncture was performed using a 5 inch 20 gauge spinal needle. Initially, CSF was not visualized. Central canal needle tip placement was confirmed in the lateral projection and ultimately low pressure CSF was seen. 15 mL of Isovue 200 M was injected into the thecal sac, with normal opacification of the nerve roots and cauda equina consistent  with free flow within the subarachnoid space. I personally performed the lumbar puncture and administered the intrathecal contrast. I also personally supervised acquisition of the myelogram images. TECHNIQUE: Contiguous axial images were obtained through the Lumbar spine after the intrathecal infusion of infusion. Coronal and sagittal reconstructions were obtained of the axial image sets. COMPARISON:  MRI 06/02/2017 FINDINGS: LUMBAR MYELOGRAM FINDINGS: High exposure study, dedicated images would be low yield in this patient who was unable to position herself without assistance. Post injection exposures were deferred. CT LUMBAR MYELOGRAM FINDINGS: Segmentation:  Normal Alignment: Slight degenerative anterolisthesis at L3-4 and retrolisthesis at L2-3. Vertebrae:  Negative for fracture, bone lesion, or endplate erosion. Conus: Normal size and morphology, tip terminating at the L1 body level. Disc levels: T12- L1: Spondylosis with ankylosing ventral osteophyte. No impingement L1-L2: Spondylosis with ankylosing ventral osteophyte. Tiny calcified right paracentral disc protrusion. No notable facet degenerative change. No impingement. L2-L3: Degenerative disc narrowing with mild endplate ridging. Facet and ligament thickening. Moderate spinal stenosis with crowding of the cauda equina. No discrete herniation or compression. Inferior foraminal effacement without L2 compression. L3-L4: Prominent facet arthropathy, especially on the right. There has been a left-sided partial facetectomy and laminotomy. Spinal stenosis is mild to moderate on the right without discrete compression or herniation. The cauda equina is crowded. Inferior foraminal effacement without L3 compression. L4-L5: Discectomy with solid arthrodesis. No residual impingement. Posterior rod and pedicle screws are in good position. L5-S1:Moderate degenerative disc narrowing and endplate ridging. Facet arthropathy with moderate hypertrophy. The thecal sac is  narrow at baseline. Right paracentral endplate ridge causes prominent impingement on the S1 nerve root as it passes in front of the hypertrophic facet. There is mild foraminal narrowing. IMPRESSION: LUMBAR MYELOGRAM IMPRESSION: Successful intrathecal injection at L1-2. CT LUMBAR MYELOGRAM IMPRESSION: 1. L5-S1 right paracentral endplate ridge which compresses the right S1 nerve root. This spur has been present since at least 2014 abdominal CT. 2. L2-3 mild to moderate spinal stenosis without discrete compression. 3. L3-4 moderate spinal stenosis in the right canal without discrete compression. Better patency of the left canal after a left laminotomy and partial facetectomy. 4. L4-5 discectomy with solid arthrodesis and no residual impingement. 5. No compressive foraminal narrowing. Electronically Signed   By: Monte Fantasia M.D.   On: 06/07/2017 16:30    Scheduled Meds: . aspirin EC  81 mg Oral Q1200  . estradiol  1 Applicatorful Vaginal QHS  . iopamidol  20 mL Intra-articular Once  . losartan  50 mg Oral Q1200  . metoprolol succinate  50 mg Oral Daily  . pantoprazole  40 mg Oral Daily  . pravastatin  40 mg Oral Q1200  . trimethoprim  100 mg Oral Daily  . Vitamin D (Ergocalciferol)  50,000 Units Oral Q7 days   Continuous Infusions:   LOS: 4 days   Time spent: 25 minutes   Faye Ramsay, MD [facility] Pager [phone]  If 7PM-7AM, please contact night-coverage [email or web address] Password [facility] 06/09/2017, 10:17 AM

## 2017-06-09 NOTE — Anesthesia Preprocedure Evaluation (Addendum)
Anesthesia Evaluation  Patient identified by MRN, date of birth, ID band Patient awake    Reviewed: Allergy & Precautions, H&P , NPO status , Patient's Chart, lab work & pertinent test results  Airway Mallampati: III  TM Distance: >3 FB Neck ROM: Full    Dental no notable dental hx. (+) Teeth Intact, Dental Advisory Given   Pulmonary asthma , sleep apnea and Continuous Positive Airway Pressure Ventilation , COPD,    Pulmonary exam normal breath sounds clear to auscultation       Cardiovascular hypertension, Pt. on medications and Pt. on home beta blockers + Past MI   Rhythm:Regular Rate:Normal     Neuro/Psych negative neurological ROS  negative psych ROS   GI/Hepatic Neg liver ROS, GERD  Medicated and Controlled,  Endo/Other  Morbid obesity  Renal/GU negative Renal ROS  negative genitourinary   Musculoskeletal   Abdominal   Peds  Hematology negative hematology ROS (+)   Anesthesia Other Findings   Reproductive/Obstetrics negative OB ROS                            Anesthesia Physical Anesthesia Plan  ASA: III  Anesthesia Plan: General   Post-op Pain Management:    Induction: Intravenous  PONV Risk Score and Plan: 4 or greater and Ondansetron, Dexamethasone and Midazolam  Airway Management Planned: Oral ETT  Additional Equipment:   Intra-op Plan:   Post-operative Plan: Extubation in OR  Informed Consent: I have reviewed the patients History and Physical, chart, labs and discussed the procedure including the risks, benefits and alternatives for the proposed anesthesia with the patient or authorized representative who has indicated his/her understanding and acceptance.   Dental advisory given  Plan Discussed with: CRNA  Anesthesia Plan Comments:         Anesthesia Quick Evaluation

## 2017-06-10 LAB — CBC
HEMATOCRIT: 36.6 % (ref 36.0–46.0)
HEMOGLOBIN: 11.3 g/dL — AB (ref 12.0–15.0)
MCH: 26.3 pg (ref 26.0–34.0)
MCHC: 30.9 g/dL (ref 30.0–36.0)
MCV: 85.1 fL (ref 78.0–100.0)
Platelets: 316 10*3/uL (ref 150–400)
RBC: 4.3 MIL/uL (ref 3.87–5.11)
RDW: 17.4 % — AB (ref 11.5–15.5)
WBC: 19.8 10*3/uL — ABNORMAL HIGH (ref 4.0–10.5)

## 2017-06-10 LAB — BASIC METABOLIC PANEL
Anion gap: 10 (ref 5–15)
BUN: 9 mg/dL (ref 6–20)
CALCIUM: 8.5 mg/dL — AB (ref 8.9–10.3)
CHLORIDE: 101 mmol/L (ref 101–111)
CO2: 26 mmol/L (ref 22–32)
CREATININE: 0.85 mg/dL (ref 0.44–1.00)
GFR calc non Af Amer: >60 mL/min (ref >60)
GLUCOSE: 143 mg/dL — AB (ref 65–99)
Potassium: 4 mmol/L (ref 3.5–5.1)
Sodium: 137 mmol/L (ref 135–145)

## 2017-06-10 MED ORDER — SODIUM CHLORIDE 0.9 % IV BOLUS (SEPSIS)
500.0000 mL | Freq: Once | INTRAVENOUS | Status: AC
  Administered 2017-06-10: 500 mL via INTRAVENOUS

## 2017-06-10 MED ORDER — METOPROLOL SUCCINATE ER 50 MG PO TB24
50.0000 mg | ORAL_TABLET | Freq: Every day | ORAL | Status: DC
  Administered 2017-06-12 – 2017-06-16 (×4): 50 mg via ORAL
  Filled 2017-06-10 (×9): qty 1

## 2017-06-10 NOTE — Evaluation (Signed)
Physical Therapy Evaluation Patient Details Name: [name] MRN: [id number] DOB: Apr 20, 1956 Today's Date: 06/10/2017   History of Present Illness  [name] is a 61 y.o. female with medical history significant for asthma, COPD, hypertension, CAD status post MI, history of PE on Coumadin, chronic back pain, admitted with right low back pain and L4-S1 stenosis s/p steroid injection S1 06/04/17. Pt now s/p L5-S1 PLIF with removal of hardware L4-5 on 06/09/17.  Clinical Impression  Patient required CGA for gait. She was able to ambulate 15'. She reports she feels her mobility was worse prior to the operation. Her father and her brother were assisting her at home before. She would like to go home and does not have interest in rehab. Therapy will continue to work with her on gait distance and strengthening. She would benefit from home health PT on discharge.     Follow Up Recommendations Home health PT;Supervision/Assistance - 24 hour    Equipment Recommendations  Rolling walker with 5" wheels    Recommendations for Other Services       Precautions / Restrictions Precautions Precautions: Fall;Back Precaution Booklet Issued: Yes (comment) Precaution Comments: Educated pt concerning back precautions. She was able to recall 3/3. Required Braces or Orthoses: Spinal Brace Spinal Brace: Lumbar corset;Applied in sitting position Restrictions Weight Bearing Restrictions: No      Mobility  Bed Mobility Overal bed mobility: Needs Assistance Bed Mobility: Rolling;Sidelying to Sit;Sit to Sidelying Rolling: Min assist Sidelying to sit: Min assist       General bed mobility comments: Patient required min a for log roll and min a to sit up at the edge of the bed,. She required no assist to sit while biotech fit her for her corsett brace   Transfers Overall transfer level: Needs assistance Equipment used: Rolling walker (2 wheeled) Transfers: Sit to/from Stand Sit to Stand: Min  guard         General transfer comment: slow transfer but able to complete with just guarding. No syncope noted.   Ambulation/Gait Ambulation/Gait assistance: Min guard Ambulation Distance (Feet): 15 Feet Assistive device: Rolling walker (2 wheeled) Gait Pattern/deviations: Step-to pattern;Decreased stride length;Wide base of support;Trunk flexed Gait velocity: slow Gait velocity interpretation: Below normal speed for age/gender General Gait Details: slow steps. Some difficulty moving right lower extremity at first but improve.   Stairs            Wheelchair Mobility    Modified Rankin (Stroke Patients Only)       Balance Overall balance assessment: Needs assistance Sitting-balance support: Feet unsupported;Bilateral upper extremity supported Sitting balance-Leahy Scale: Poor Sitting balance - Comments: B UE support secondary to pain.    Standing balance support: Bilateral upper extremity supported;During functional activity Standing balance-Leahy Scale: Poor Standing balance comment: reliant on walker. Difficult weightbearing on the left UE                              Pertinent Vitals/Pain Pain Assessment: 0-10 Pain Score: 6  Pain Location: back to bilateral hips Pain Descriptors / Indicators: Sharp;Radiating;Spasm Pain Intervention(s): Limited activity within patient's tolerance;Monitored during session;Premedicated before session;Repositioned;Patient requesting pain meds-RN notified    Home Living Family/patient expects to be discharged to:: Private residence Living Arrangements: Parent;Other relatives Available Help at Discharge: Family;Available 24 hours/day Type of Home: Mobile home Home Access: Ramped entrance     Home Layout: One level Home Equipment: Adaptive equipment;Shower seat;Cane - single point;Walker -  standard;Hand held shower head      Prior Function Level of Independence: Needs assistance   Gait / Transfers Assistance  Needed: Uses cane, has been sedentary recently due to pain.  ADL's / Homemaking Assistance Needed: For about at year, family has been helping with bathing and dresssing especially Lower body  Comments: Pt reports she's driven to the grocery store 3 min down the road 3x in the last 6 months     Hand Dominance   Dominant Hand: Right    Extremity/Trunk Assessment   Upper Extremity Assessment Upper Extremity Assessment: LUE deficits/detail RUE Deficits / Details: Painful with movement at baseline.  LUE Deficits / Details: Limited at baseline. Torn rotator cuff, arthritis, bone spurs and awaiting shoulder replacement. Numbness/tingling in hand.     Lower Extremity Assessment Lower Extremity Assessment: RLE deficits/detail;Generalized weakness RLE Deficits / Details: improved from baseline but still weak    Cervical / Trunk Assessment Cervical / Trunk Assessment: Other exceptions Cervical / Trunk Exceptions: s/p surgery  Communication   Communication: No difficulties  Cognition Arousal/Alertness: Awake/alert Behavior During Therapy: WFL for tasks assessed/performed Overall Cognitive Status: Within Functional Limits for tasks assessed                                        General Comments      Exercises     Assessment/Plan    PT Assessment Patient needs continued PT services  PT Problem List Decreased strength;Decreased range of motion;Decreased activity tolerance;Decreased balance;Decreased mobility;Decreased coordination       PT Treatment Interventions DME instruction;Gait training;Stair training;Functional mobility training;Therapeutic activities;Therapeutic exercise;Balance training    PT Goals (Current goals can be found in the Care Plan section)  Acute Rehab PT Goals Patient Stated Goal: to get stronger  PT Goal Formulation: With patient Time For Goal Achievement: 06/19/17 Potential to Achieve Goals: Good    Frequency Min 5X/week   Barriers  to discharge        Co-evaluation               AM-PAC PT "6 Clicks" Daily Activity  Outcome Measure Difficulty turning over in bed (including adjusting bedclothes, sheets and blankets)?: A Little Difficulty moving from lying on back to sitting on the side of the bed? : A Little Difficulty sitting down on and standing up from a chair with arms (e.g., wheelchair, bedside commode, etc,.)?: A Little Help needed moving to and from a bed to chair (including a wheelchair)?: A Little Help needed walking in hospital room?: A Little Help needed climbing 3-5 steps with a railing? : A Lot 6 Click Score: 17    End of Session Equipment Utilized During Treatment: Gait belt Activity Tolerance: Patient limited by pain Patient left: in chair;with call bell/phone within reach Nurse Communication: Mobility status;Other (comment) PT Visit Diagnosis: Pain;Other symptoms and signs involving the nervous system (R29.898);Other abnormalities of gait and mobility (R26.89);Difficulty in walking, not elsewhere classified (R26.2);Muscle weakness (generalized) (M62.81) Pain - Right/Left: Right Pain - part of body:  (back )    Time: 7342-8768 PT Time Calculation (min) (ACUTE ONLY): 23 min   Charges:   PT Evaluation $PT Eval Moderate Complexity: 1 Mod     PT G Codes:         Carney Living PT DPT  06/10/2017, 2:10 PM

## 2017-06-10 NOTE — Progress Notes (Signed)
Patient has home CPAP unit and is able to place self on and off when ready. RT filled water chamber with sterile water and placed machine within reach of patient. Patient knows to have RN contact RT if she needs help with anything.

## 2017-06-10 NOTE — Progress Notes (Signed)
Orthopedic Tech Progress Note Patient Details:  [name] [birth year] [id number]  Patient ID: [name], female   DOB: Sep 25, 1956, 61 y.o.   MRN: [id number]   Hildred Priest 06/10/2017, 9:51 AM Called in bio-tech brace order; spoke with Harrington Challenger

## 2017-06-10 NOTE — Progress Notes (Signed)
Subjective: Patient reports patient doing well condition of back pain but no new leg pain  Objective: Vital signs in last 24 hours: Temp:  [97.2 F (36.2 C)-99.2 F (37.3 C)] 98.4 F (36.9 C) (08/18 0407) Pulse Rate:  [70-91] 73 (08/18 0407) Resp:  [11-21] 16 (08/17 2335) BP: (91-143)/(50-75) 91/50 (08/18 0407) SpO2:  [95 %-100 %] 95 % (08/18 0407)  Intake/Output from previous day: 08/17 0701 - 08/18 0700 In: 2050 [I.V.:2050] Out: 2650 [Urine:2150; Blood:500] Intake/Output this shift: No intake/output data recorded.  strength 5 out of 5 wound clean dry and intact  Lab Results:  Recent Labs  06/09/17 0346 06/10/17 0228  WBC 12.7* 19.8*  HGB 14.1 11.3*  HCT 44.4 36.6  PLT 346 316   BMET  Recent Labs  06/09/17 0346 06/10/17 0228  NA 139 137  K 4.5 4.0  CL 101 101  CO2 28 26  GLUCOSE 104* 143*  BUN 9 9  CREATININE 0.80 0.85  CALCIUM 9.6 8.5*    Studies/Results: Dg Chest 2 View  Result Date: 06/08/2017 CLINICAL DATA:  Shortness of breath.  Chronic chest pain. EXAM: CHEST  2 VIEW COMPARISON:  June 02, 2017 FINDINGS: No pneumothorax. Atelectasis or scar in the left mid lung. No pulmonary nodules or masses. No focal infiltrates. The cardiomediastinal silhouette is stable. IMPRESSION: Scar or atelectasis in the left mid lung. No other acute abnormalities. Electronically Signed   By: Dorise Bullion III M.D   On: 06/08/2017 21:18   Dg Lumbar Spine 2-3 Views  Result Date: 06/09/2017 CLINICAL DATA:  Elective surgery.  Lumbar fusion. EXAM: DG C-ARM 61-120 MIN; LUMBAR SPINE - 2-3 VIEW COMPARISON:  Preoperative lumbar myelogram 06/07/2017 FINDINGS: Frontal and lateral coned fluoroscopic spot views of the lumbar spine. Two level posterior fusion with intrapedicular screws, likely L5-S1 level, however levels not well-defined given body habitus. There is an interbody spacer. Please reference operative report for more details. Total fluoroscopy time reported 8 seconds.  IMPRESSION: Procedural fluoroscopy for lumbar fusion. Electronically Signed   By: Jeb Levering M.D.   On: 06/09/2017 16:10   Dg C-arm 1-60 Min  Result Date: 06/09/2017 CLINICAL DATA:  Elective surgery.  Lumbar fusion. EXAM: DG C-ARM 61-120 MIN; LUMBAR SPINE - 2-3 VIEW COMPARISON:  Preoperative lumbar myelogram 06/07/2017 FINDINGS: Frontal and lateral coned fluoroscopic spot views of the lumbar spine. Two level posterior fusion with intrapedicular screws, likely L5-S1 level, however levels not well-defined given body habitus. There is an interbody spacer. Please reference operative report for more details. Total fluoroscopy time reported 8 seconds. IMPRESSION: Procedural fluoroscopy for lumbar fusion. Electronically Signed   By: Jeb Levering M.D.   On: 06/09/2017 16:10    Assessment/Plan: Postoperative day 1 L5-S1 interbody fusion with removal of hardware L4-5. Patient making normal and expected recovery blood pressure is a little bit saggy so I have given her a 500 mL bolus. Hematocrit stable to varus morning. Mobilize with physical and occupational therapy.  LOS: 5 days     Birgitta Uhlir P 06/10/2017, 8:52 AM

## 2017-06-10 NOTE — Progress Notes (Signed)
Patient able to place self on and off of CPAP when ready.

## 2017-06-10 NOTE — Evaluation (Signed)
Occupational Therapy Evaluation Patient Details Name: KAREN HUHTA MRN: [id number] DOB: 02-Jul-1956 Today's Date: 06/10/2017    History of Present Illness CHABLIS LOSH is a 61 y.o. female with medical history significant for asthma, COPD, hypertension, CAD status post MI, history of PE on Coumadin, chronic back pain, admitted with right low back pain and L4-S1 stenosis s/p steroid injection S1 06/04/17. Pt now s/p L5-S1 PLIF with removal of hardware L4-5 on 06/09/17.   Clinical Impression   PTA, pt reports requiring occasional assistance for dressing and bathing tasks and was ambulating short distances with her cane. Pt currently requires max assist for LB ADL and mod assist for seated UB ADL. On evaluation, pt significantly limited by pain and decreased activity tolerance for ADL. Feel she would benefit from short-term SNF placement to improve independence and safety with ADL prior to returning home; however, she declines SNF at this time. Pt will need maximum home health services and 24 hour assistance post-acute D/C and may benefit from [facility] program. OT will continue to follow while admitted.    Follow Up Recommendations  Home health OT;Supervision/Assistance - 24 hour;DC plan and follow up therapy as arranged by surgeon (Pt declining SNF )    Equipment Recommendations  3 in 1 bedside commode    Recommendations for Other Services       Precautions / Restrictions Precautions Precautions: Fall;Back Precaution Booklet Issued: Yes (comment) (already provided pre-operatively) Precaution Comments: Educated pt concerning back precautions. She was able to recall 3/3. Required Braces or Orthoses: Spinal Brace Spinal Brace: Lumbar corset;Applied in sitting position Restrictions Weight Bearing Restrictions: No      Mobility Bed Mobility Overal bed mobility: Needs Assistance Bed Mobility: Rolling;Sidelying to Sit;Sit to Sidelying Rolling: Min assist Sidelying to sit: Min  guard     Sit to sidelying: Min assist General bed mobility comments: Heavy reliance on bed rails and requiring assist to maintain precautions.   Transfers                 General transfer comment: Deferred secondary to pain and new brace not delivered.     Balance Overall balance assessment: Needs assistance Sitting-balance support: Feet unsupported;Bilateral upper extremity supported Sitting balance-Leahy Scale: Poor Sitting balance - Comments: B UE support secondary to pain.                                    ADL either performed or assessed with clinical judgement   ADL Overall ADL's : Needs assistance/impaired Eating/Feeding: Set up;Bed level   Grooming: Set up;Bed level   Upper Body Bathing: Sitting;Moderate assistance   Lower Body Bathing: Maximal assistance;Sitting/lateral leans   Upper Body Dressing : Moderate assistance;Sitting   Lower Body Dressing: Maximal assistance;Sitting/lateral leans                 General ADL Comments: Pt awaiting delivery of new brace but has old brace in room. Pt with increased pain and decreased activity tolerance for ADL limiting session. Able to complete ADL seated at EOB for approximately 10 minutes.      Vision Baseline Vision/History: Wears glasses Wears Glasses: Reading only (and driving) Patient Visual Report: No change from baseline Vision Assessment?: No apparent visual deficits     Perception     Praxis      Pertinent Vitals/Pain Pain Assessment: 0-10 Pain Score: 6  Pain Location: back to bilateral hips Pain Descriptors /  Indicators: Sharp;Radiating;Spasm Pain Intervention(s): Limited activity within patient's tolerance;Monitored during session;Repositioned;Premedicated before session     Hand Dominance Right   Extremity/Trunk Assessment Upper Extremity Assessment Upper Extremity Assessment: LUE deficits/detail;RUE deficits/detail RUE Deficits / Details: Painful with movement at  baseline.  LUE Deficits / Details: Limited at baseline. Torn rotator cuff, arthritis, bone spurs and awaiting shoulder replacement. Numbness/tingling in hand.    Lower Extremity Assessment Lower Extremity Assessment: Generalized weakness (tingling in toes)   Cervical / Trunk Assessment Cervical / Trunk Assessment: Other exceptions Cervical / Trunk Exceptions: s/p surgery   Communication Communication Communication: No difficulties   Cognition Arousal/Alertness: Awake/alert Behavior During Therapy: WFL for tasks assessed/performed Overall Cognitive Status: Within Functional Limits for tasks assessed                                     General Comments       Exercises     Shoulder Instructions      Home Living Family/patient expects to be discharged to:: Private residence Living Arrangements: Parent;Other relatives (brother and father) Available Help at Discharge: Family;Available 24 hours/day Type of Home: Mobile home Home Access: Ramped entrance     Home Layout: One level     Bathroom Shower/Tub: Occupational psychologist: Handicapped height Bathroom Accessibility: Yes   Home Equipment: Research scientist (life sciences);Shower seat;Cane - single point;Walker - standard;Hand held shower head (scooter but batteries are dead) Adaptive Equipment: Reacher        Prior Functioning/Environment Level of Independence: Needs assistance  Gait / Transfers Assistance Needed: Uses cane, has been sedentary recently due to pain. ADL's / Homemaking Assistance Needed: For about at year, family has been helping with bathing and dresssing especially Lower body   Comments: Pt reports she's driven to the grocery store 3 min down the road 3x in the last 6 months        OT Problem List: Decreased strength;Decreased range of motion;Decreased activity tolerance;Impaired balance (sitting and/or standing);Decreased safety awareness;Decreased knowledge of use of DME or AE;Decreased  knowledge of precautions;Pain;Impaired UE functional use      OT Treatment/Interventions: Self-care/ADL training;Therapeutic exercise;Energy conservation;DME and/or AE instruction;Therapeutic activities;Patient/family education;Balance training    OT Goals(Current goals can be found in the care plan section) Acute Rehab OT Goals Patient Stated Goal: go to the beach OT Goal Formulation: With patient Time For Goal Achievement: 06/24/17 Potential to Achieve Goals: Good ADL Goals Pt Will Perform Upper Body Dressing: with supervision;sitting Pt Will Perform Lower Body Dressing: with min assist;with adaptive equipment;sit to/from stand Pt Will Transfer to Toilet: with supervision;ambulating;bedside commode (BSC over toilet) Pt Will Perform Toileting - Clothing Manipulation and hygiene: with supervision;with adaptive equipment;sit to/from stand Additional ADL Goal #1: Pt will complete bed mobility with overall supervision in preparation for ADL seated at EOB.  OT Frequency: Min 2X/week   Barriers to D/C:            Co-evaluation              AM-PAC PT "6 Clicks" Daily Activity     Outcome Measure Help from another person eating meals?: A Little Help from another person taking care of personal grooming?: A Little Help from another person toileting, which includes using toliet, bedpan, or urinal?: A Lot Help from another person bathing (including washing, rinsing, drying)?: A Lot Help from another person to put on and taking off regular upper body clothing?: A  Lot Help from another person to put on and taking off regular lower body clothing?: A Lot 6 Click Score: 14   End of Session Equipment Utilized During Treatment: Back brace Nurse Communication: Mobility status;Other (comment) (pt needs Pure Wick replaced)  Activity Tolerance: Patient limited by pain Patient left: in bed;with call bell/phone within reach  OT Visit Diagnosis: Muscle weakness (generalized) (M62.81);Pain;Other  abnormalities of gait and mobility (R26.89) Pain - Right/Left: Right (back) Pain - part of body: Hip (back)                Time: 6606-3016 OT Time Calculation (min): 37 min Charges:  OT General Charges $OT Visit: 1 Procedure OT Evaluation $OT Eval Moderate Complexity: 1 Procedure OT Treatments $Self Care/Home Management : 8-22 mins G-Codes:     Norman Herrlich, MS OTR/L  Pager: [place] A Alford Gamero 06/10/2017, 10:54 AM

## 2017-06-10 NOTE — Progress Notes (Signed)
PROGRESS NOTE  [name] OBS:[id number] DOB: [birth year] DOA: 06/02/2017   PCP: Thressa Sheller, MD  Brief Narrative: 61 year old woman PMH COPD, coronary artery and a disease, pulmonary embolism on warfarin, chronic back pain presented with worsening back pain, right leg sciatica and intermittent paresthesia. Admitted for intractable pain, neurosurgery evaluation.  Assessment/Plan  Intractable lower back pain - with associated sciatica and intermittent paresthesias, secondary to spinal stenosis pain.  - Status post transforaminal block with minimal response 8/13 - per neurosurgery, worse area of stenosis is at L5-S1 causing severe compromise of the right sided Sq nerve root - pt is no post op day #1, status post L5-S1 interbody fusion with removal of hardware L4-5 - pt making expected recovery with exception of lower BP - bolus 500 cc given, holding BP meds as well  - will need PT and OT eval again post op  Leukocytosis - likely reactive - CBC in AM  Dyspnea 8/16 - CXR with no evidence of PNA - respiratory status stable this AM - IS while awake  PMH pulmonary embolism, DVT - resume Warfarin when neurosurgery OK with that  PMH chronic UTI, asymptomatic bacteriuria - Patient asymptomatic. Continue trimethoprim.  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 - BD's as needed   Essential hypertension - SBP low in 90's - will hold Losartan for now (pt was on Losartan 50 mg PO QD) - holding parameters placed on metoprolol - lasix is only to be given as needed per previous home medical regimen   Morbid obesity class III. - Body mass index is 49.21 kg/m.  DVT prophylaxis: SCD's Code Status: Full  Family Communication: pt at bedside  Disposition Plan: needs to be seen by PT post op and further recommendations pending PT rec's  Consultants:  Neurosurgery  IR  Procedures:  8/12 Right S1 nerve root epidural steroid injection  performed for Right S1 radiculopathy secondary to stenosis at L5/S1  8/17 Removal of Steffee hardware L4-L5, decompression of L5-S1 via laminectomy and total discectomy with posterior lumbar interbody arthrodesis using peek spacer local autograft and allograft pedicle screw fixation L5-S1 posterior lateral arthrodesis with local autograft and allograft L5-S1  Antimicrobials:  Trimethoprim   Subjective: No chest pain. Reports pain in the back area but overall better.  Objective: Vitals:   06/09/17 2335 06/09/17 2344 06/09/17 2346 06/10/17 0407  BP:  (!) 93/59 (!) 92/54 (!) 91/50  Pulse: 76 74  73  Resp: 16     Temp:  98.2 F (36.8 C)  98.4 F (36.9 C)  TempSrc:  Oral  Oral  SpO2: 96% 98%  95%  Weight:        Intake/Output Summary (Last 24 hours) at 06/10/17 0923 Last data filed at 06/10/17 0618  Gross per 24 hour  Intake             2050 ml  Output             2650 ml  Net             -600 ml   Filed Weights   06/03/17 0300 06/05/17 0500 06/07/17 0500  Weight: 129.7 kg (286 lb) 127.5 kg (281 lb 1.6 oz) 126 kg (277 lb 12.8 oz)   Physical Exam  Constitutional: Appears calm, NAD CVS: RRR, S1/S2 +, no murmurs, no gallops, no carotid bruit.  Pulmonary: Effort and breath sounds normal, diminished breath sounds at bases  Abdominal: Soft. BS +,  no distension, tenderness, rebound or  guarding.  Neuro: Alert. Normal reflexes, muscle tone coordination.   Data Reviewed: I have personally reviewed following labs and imaging studies  CBC:  Recent Labs Lab 06/07/17 0525 06/09/17 0346 06/10/17 0228  WBC 10.5 12.7* 19.8*  HGB 12.3 14.1 11.3*  HCT 40.0 44.4 36.6  MCV 84.7 84.6 85.1  PLT 287 346 726   Basic Metabolic Panel:  Recent Labs Lab 06/07/17 0525 06/09/17 0346 06/10/17 0228  NA 140 139 137  K 4.0 4.5 4.0  CL 104 101 101  CO2 26 28 26   GLUCOSE 116* 104* 143*  BUN 7 9 9   CREATININE 0.93 0.80 0.85  CALCIUM 8.7* 9.6 8.5*   Coagulation Profile:  Recent  Labs Lab 06/04/17 0510  INR 1.62   Urine analysis:    Component Value Date/Time   COLORURINE YELLOW 06/02/2017 1440   APPEARANCEUR HAZY (A) 06/02/2017 1440   LABSPEC 1.024 06/02/2017 1440   PHURINE 5.0 06/02/2017 1440   GLUCOSEU NEGATIVE 06/02/2017 1440   HGBUR NEGATIVE 06/02/2017 1440   BILIRUBINUR NEGATIVE 06/02/2017 1440   KETONESUR NEGATIVE 06/02/2017 1440   PROTEINUR NEGATIVE 06/02/2017 1440   UROBILINOGEN 1.0 04/15/2013 2331   NITRITE NEGATIVE 06/02/2017 1440   LEUKOCYTESUR NEGATIVE 06/02/2017 1440    Recent Results (from the past 240 hour(s))  Culture, Urine     Status: Abnormal   Collection Time: 06/02/17  1:00 AM  Result Value Ref Range Status   Specimen Description URINE, CATHETERIZED  Final   Special Requests NONE  Final   Culture (A)  Final    70,000 COLONIES/mL PSEUDOMONAS AERUGINOSA 80,000 COLONIES/mL VIRIDANS STREPTOCOCCUS CALL MICROBIOLOGY LAB IF SENSITIVITIES ARE REQUIRED.    Report Status 06/05/2017 FINAL  Final   Organism ID, Bacteria PSEUDOMONAS AERUGINOSA (A)  Final      Susceptibility   Pseudomonas aeruginosa - MIC*    CEFTAZIDIME 8 SENSITIVE Sensitive     CIPROFLOXACIN <=0.25 SENSITIVE Sensitive     GENTAMICIN <=1 SENSITIVE Sensitive     IMIPENEM 2 SENSITIVE Sensitive     PIP/TAZO 16 SENSITIVE Sensitive     CEFEPIME 4 SENSITIVE Sensitive     * 70,000 COLONIES/mL PSEUDOMONAS AERUGINOSA  Culture, blood (Routine X 2) w Reflex to ID Panel     Status: None   Collection Time: 06/02/17  5:22 PM  Result Value Ref Range Status   Specimen Description BLOOD LEFT ARM  Final   Special Requests   Final    BOTTLES DRAWN AEROBIC AND ANAEROBIC Blood Culture adequate volume   Culture NO GROWTH 5 DAYS  Final   Report Status 06/07/2017 FINAL  Final  Culture, blood (Routine X 2) w Reflex to ID Panel     Status: None   Collection Time: 06/02/17  6:24 PM  Result Value Ref Range Status   Specimen Description BLOOD RIGHT ARM  Final   Special Requests IN PEDIATRIC  BOTTLE Blood Culture adequate volume  Final   Culture NO GROWTH 5 DAYS  Final   Report Status 06/07/2017 FINAL  Final  Surgical pcr screen     Status: None   Collection Time: 06/09/17  1:23 PM  Result Value Ref Range Status   MRSA, PCR NEGATIVE NEGATIVE Final   Staphylococcus aureus NEGATIVE NEGATIVE Final    Comment:        The Xpert SA Assay (FDA approved for NASAL specimens in patients over 58 years of age), is one component of a comprehensive surveillance program.  Test performance has been validated by [facility] for  patients greater than or equal to 108 year old. It is not intended to diagnose infection nor to guide or monitor treatment.     Radiology Studies: Dg Chest 2 View  Result Date: 06/08/2017 CLINICAL DATA:  Shortness of breath.  Chronic chest pain. EXAM: CHEST  2 VIEW COMPARISON:  June 02, 2017 FINDINGS: No pneumothorax. Atelectasis or scar in the left mid lung. No pulmonary nodules or masses. No focal infiltrates. The cardiomediastinal silhouette is stable. IMPRESSION: Scar or atelectasis in the left mid lung. No other acute abnormalities. Electronically Signed   By: Dorise Bullion III M.D   On: 06/08/2017 21:18   Dg Lumbar Spine 2-3 Views  Result Date: 06/09/2017 CLINICAL DATA:  Elective surgery.  Lumbar fusion. EXAM: DG C-ARM 61-120 MIN; LUMBAR SPINE - 2-3 VIEW COMPARISON:  Preoperative lumbar myelogram 06/07/2017 FINDINGS: Frontal and lateral coned fluoroscopic spot views of the lumbar spine. Two level posterior fusion with intrapedicular screws, likely L5-S1 level, however levels not well-defined given body habitus. There is an interbody spacer. Please reference operative report for more details. Total fluoroscopy time reported 8 seconds. IMPRESSION: Procedural fluoroscopy for lumbar fusion. Electronically Signed   By: Jeb Levering M.D.   On: 06/09/2017 16:10   Dg C-arm 1-60 Min  Result Date: 06/09/2017 CLINICAL DATA:  Elective surgery.  Lumbar fusion.  EXAM: DG C-ARM 61-120 MIN; LUMBAR SPINE - 2-3 VIEW COMPARISON:  Preoperative lumbar myelogram 06/07/2017 FINDINGS: Frontal and lateral coned fluoroscopic spot views of the lumbar spine. Two level posterior fusion with intrapedicular screws, likely L5-S1 level, however levels not well-defined given body habitus. There is an interbody spacer. Please reference operative report for more details. Total fluoroscopy time reported 8 seconds. IMPRESSION: Procedural fluoroscopy for lumbar fusion. Electronically Signed   By: Jeb Levering M.D.   On: 06/09/2017 16:10    Scheduled Meds: . aspirin EC  81 mg Oral Q1200  . docusate sodium  100 mg Oral BID  . estradiol  1 Applicatorful Vaginal QHS  . iopamidol  20 mL Intra-articular Once  . losartan  50 mg Oral Q1200  . metoprolol succinate  50 mg Oral Daily  . pantoprazole  40 mg Oral Daily  . pravastatin  40 mg Oral Q1200  . senna  1 tablet Oral BID  . sodium chloride flush  3 mL Intravenous Q12H  . trimethoprim  100 mg Oral Daily  . Vitamin D (Ergocalciferol)  50,000 Units Oral Q7 days   Continuous Infusions: . sodium chloride    . lactated ringers 10 mL/hr at 06/09/17 1134  . lactated ringers    . methocarbamol (ROBAXIN)  IV    . sodium chloride      LOS: 5 days   Time spent: 25 minutes   Faye Ramsay, MD [facility] Pager [phone]  If 7PM-7AM, please contact night-coverage [email or web address] Password [facility] 06/10/2017, 9:23 AM

## 2017-06-11 LAB — CBC
HEMATOCRIT: 30.5 % — AB (ref 36.0–46.0)
HEMOGLOBIN: 9.4 g/dL — AB (ref 12.0–15.0)
MCH: 26.7 pg (ref 26.0–34.0)
MCHC: 30.8 g/dL (ref 30.0–36.0)
MCV: 86.6 fL (ref 78.0–100.0)
Platelets: 217 10*3/uL (ref 150–400)
RBC: 3.52 MIL/uL — ABNORMAL LOW (ref 3.87–5.11)
RDW: 17.5 % — AB (ref 11.5–15.5)
WBC: 12.6 10*3/uL — ABNORMAL HIGH (ref 4.0–10.5)

## 2017-06-11 LAB — BASIC METABOLIC PANEL
Anion gap: 7 (ref 5–15)
BUN: 11 mg/dL (ref 6–20)
CALCIUM: 7.9 mg/dL — AB (ref 8.9–10.3)
CHLORIDE: 104 mmol/L (ref 101–111)
CO2: 27 mmol/L (ref 22–32)
CREATININE: 0.67 mg/dL (ref 0.44–1.00)
GFR calc non Af Amer: >60 mL/min (ref >60)
GLUCOSE: 131 mg/dL — AB (ref 65–99)
Potassium: 3.6 mmol/L (ref 3.5–5.1)
Sodium: 138 mmol/L (ref 135–145)

## 2017-06-11 MED ORDER — HYDROMORPHONE HCL 1 MG/ML IJ SOLN
0.5000 mg | INTRAMUSCULAR | Status: DC | PRN
  Administered 2017-06-11 – 2017-06-15 (×19): 1 mg via INTRAVENOUS
  Filled 2017-06-11 (×19): qty 1

## 2017-06-11 NOTE — OR Nursing (Signed)
Postop day 2. Pain well controlled. No lower extremity symptoms. Beginning to mobilize. No new issues.  Progressing well following L5-S1 decompression and fusion. Continue efforts at mobilization. No new recommendations.

## 2017-06-11 NOTE — Progress Notes (Signed)
Physical Therapy Treatment Patient Details Name: [name] MRN: [id number] DOB: September 26, 1956 Today's Date: 06/11/2017    History of Present Illness [name] is a 61 y.o. female with medical history significant for asthma, COPD, hypertension, CAD status post MI, history of PE on Coumadin, chronic back pain, admitted with right low back pain and L4-S1 stenosis s/p steroid injection S1 06/04/17. Pt now s/p L5-S1 PLIF with removal of hardware L4-5 on 06/09/17. now s/p  L5-S1 decompression and fusion    PT Comments    Continuing work on functional mobility and activity tolerance; Was pleased to get up and out of the bed; More RLE pain today than yesterday; Will continue efforts to prepare [name] for [employer] home   Follow Up Recommendations  Home health PT;Supervision/Assistance - 24 hour     Equipment Recommendations  Rolling walker with 5" wheels    Recommendations for Other Services       Precautions / Restrictions Precautions Precautions: Fall;Back Precaution Booklet Issued: Yes (comment) Precaution Comments: Educated pt concerning back precautions. She was able to recall 3/3. Required Braces or Orthoses: Spinal Brace Spinal Brace: Lumbar corset;Applied in sitting position    Mobility  Bed Mobility Overal bed mobility: Needs Assistance Bed Mobility: Rolling;Sidelying to Sit Rolling: Min guard (incr time) Sidelying to sit: Min guard       General bed mobility comments: Slow moving, but able to move without physical assist  Transfers Overall transfer level: Needs assistance Equipment used: Rolling walker (2 wheeled) Transfers: Sit to/from Stand Sit to Stand: Min assist         General transfer comment: Min assist to steady RW as pt pulled up on one side  Ambulation/Gait Ambulation/Gait assistance: Min guard Ambulation Distance (Feet): 25 Feet Assistive device: Rolling walker (2 wheeled) Gait Pattern/deviations: Step-to pattern;Decreased stride  length;Wide base of support;Trunk flexed Gait velocity: slow   General Gait Details: Cues to self-monitor for activity tolerance; incr pain RLE with more distance walked   Stairs            Wheelchair Mobility    Modified Rankin (Stroke Patients Only)       Balance     Sitting balance-Leahy Scale: Fair       Standing balance-Leahy Scale: Poor                              Cognition Arousal/Alertness: Awake/alert Behavior During Therapy: WFL for tasks assessed/performed Overall Cognitive Status: Within Functional Limits for tasks assessed                                        Exercises      General Comments        Pertinent Vitals/Pain Pain Assessment: 0-10 Pain Score: 7  Pain Location: back to bilateral hips, RLE pain increased with walking Pain Descriptors / Indicators: Sharp;Radiating;Spasm Pain Intervention(s): Monitored during session;RN gave pain meds during session    Home Living                      Prior Function            PT Goals (current goals can now be found in the care plan section) Acute Rehab PT Goals Patient Stated Goal: to get stronger  PT Goal Formulation: With patient Time For Goal Achievement: 06/19/17 Potential  to Achieve Goals: Good Progress towards PT goals: Progressing toward goals    Frequency    Min 5X/week      PT Plan Current plan remains appropriate    Co-evaluation              AM-PAC PT "6 Clicks" Daily Activity  Outcome Measure  Difficulty turning over in bed (including adjusting bedclothes, sheets and blankets)?: A Little Difficulty moving from lying on back to sitting on the side of the bed? : A Little Difficulty sitting down on and standing up from a chair with arms (e.g., wheelchair, bedside commode, etc,.)?: A Little Help needed moving to and from a bed to chair (including a wheelchair)?: A Little Help needed walking in hospital room?: A Little Help  needed climbing 3-5 steps with a railing? : A Lot 6 Click Score: 17    End of Session Equipment Utilized During Treatment: Back brace Activity Tolerance: Patient tolerated treatment well;Patient limited by pain Patient left: in chair;with call bell/phone within reach Nurse Communication: Mobility status PT Visit Diagnosis: Pain;Other symptoms and signs involving the nervous system (R29.898);Other abnormalities of gait and mobility (R26.89);Difficulty in walking, not elsewhere classified (R26.2);Muscle weakness (generalized) (M62.81) Pain - Right/Left: Right Pain - part of body: Leg (back)     Time: 1458-1530 (times are approximate) PT Time Calculation (min) (ACUTE ONLY): 32 min  Charges:  $Gait Training: 8-22 mins $Therapeutic Activity: 8-22 mins                    G Codes:       Roney Marion, PT  Acute Rehabilitation Services Pager [phone] Office [place] 06/11/2017, 5:09 PM

## 2017-06-11 NOTE — Progress Notes (Signed)
PROGRESS NOTE  MAURINA FAWAZ IRS:[id number] DOB: 1956/09/16 DOA: 06/02/2017   PCP: Thressa Sheller, MD  Brief Narrative: 61 year old woman PMH COPD, coronary artery and a disease, pulmonary embolism on warfarin, chronic back pain presented with worsening back pain, right leg sciatica and intermittent paresthesia. Admitted for intractable pain, neurosurgery evaluation.  Assessment/Plan  Intractable lower back pain - with associated sciatica and intermittent paresthesias, secondary to spinal stenosis pain.  - Status post transforaminal block with minimal response 8/13 - per neurosurgery, worse area of stenosis is at L5-S1 causing severe compromise of the right sided Sq nerve root - pt is no post op day #2, status post L5-S1 interbody fusion with removal of hardware L4-5 - pt making expected recovery  - PT/OT eval done, pt declining SNF, will go home with home health PT, OT, RN  Leukocytosis - likely reactive - WBC trending down - CBC in AM  Post op blood loss anemia - no evidence of active bleeding - CBC in AM  Dyspnea 8/16 - CXR with no evidence of PNA - resp status remains stable this AM  PMH pulmonary embolism, DVT - resume Coumadin in next 24 hours   PMH chronic UTI, asymptomatic bacteriuria - Patient asymptomatic. Continue trimethoprim.  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 - BD's as needed   Essential hypertension - SBP slightly better in 100's - continue to hold Losartan  - holding parameters on Metoprolol   Morbid obesity class III. - Body mass index is 49.95 kg/m.  DVT prophylaxis: SCD's Code Status: Full  Family Communication: pt at bedside  Disposition Plan: home when neurosurgery team clears   Consultants:  Neurosurgery  IR  Procedures:  8/12 Right S1 nerve root epidural steroid injection performed for Right S1 radiculopathy secondary to stenosis at L5/S1  8/17 Removal of Steffee hardware L4-L5,  decompression of L5-S1 via laminectomy and total discectomy with posterior lumbar interbody arthrodesis using peek spacer local autograft and allograft pedicle screw fixation L5-S1 posterior lateral arthrodesis with local autograft and allograft L5-S1  Antimicrobials:  Trimethoprim   Subjective: Pt reports feeling better this AM, still with pain in the back area.   Objective: Vitals:   06/10/17 1858 06/10/17 2003 06/11/17 0452 06/11/17 0500  BP: (!) 104/55 (!) 98/43 108/63   Pulse: 91 87 86   Resp: 18  18   Temp: 98.6 F (37 C) 98.7 F (37.1 C) 98.9 F (37.2 C)   TempSrc: Oral Oral Oral   SpO2: 94% 95% 95%   Weight:    127.9 kg (282 lb)  Height:    5\' 3"  (1.6 m)    Intake/Output Summary (Last 24 hours) at 06/11/17 0949 Last data filed at 06/10/17 1431  Gross per 24 hour  Intake                0 ml  Output              400 ml  Net             -400 ml   Filed Weights   06/05/17 0500 06/07/17 0500 06/11/17 0500  Weight: 127.5 kg (281 lb 1.6 oz) 126 kg (277 lb 12.8 oz) 127.9 kg (282 lb)   Physical Exam  Constitutional: Appears well-developed and well-nourished. No distress.  CVS: RRR, S1/S2 +, no gallops, no carotid bruit.  Pulmonary: Effort and breath sounds normal, diminished breath sounds at bases  Abdominal: Soft. BS +,  no distension, tenderness, rebound or  guarding.  Musculoskeletal: No edema, still with TTP in the back at the site of the intervention   Data Reviewed: I have personally reviewed following labs and imaging studies  CBC:  Recent Labs Lab 06/07/17 0525 06/09/17 0346 06/10/17 0228 06/11/17 0444  WBC 10.5 12.7* 19.8* 12.6*  HGB 12.3 14.1 11.3* 9.4*  HCT 40.0 44.4 36.6 30.5*  MCV 84.7 84.6 85.1 86.6  PLT 287 346 316 938   Basic Metabolic Panel:  Recent Labs Lab 06/07/17 0525 06/09/17 0346 06/10/17 0228 06/11/17 0444  NA 140 139 137 138  K 4.0 4.5 4.0 3.6  CL 104 101 101 104  CO2 26 28 26 27   GLUCOSE 116* 104* 143* 131*  BUN 7 9 9 11     CREATININE 0.93 0.80 0.85 0.67  CALCIUM 8.7* 9.6 8.5* 7.9*   Urine analysis:    Component Value Date/Time   COLORURINE YELLOW 06/02/2017 1440   APPEARANCEUR HAZY (A) 06/02/2017 1440   LABSPEC 1.024 06/02/2017 1440   PHURINE 5.0 06/02/2017 1440   GLUCOSEU NEGATIVE 06/02/2017 1440   HGBUR NEGATIVE 06/02/2017 1440   BILIRUBINUR NEGATIVE 06/02/2017 1440   KETONESUR NEGATIVE 06/02/2017 1440   PROTEINUR NEGATIVE 06/02/2017 1440   UROBILINOGEN 1.0 04/15/2013 2331   NITRITE NEGATIVE 06/02/2017 1440   LEUKOCYTESUR NEGATIVE 06/02/2017 1440    Recent Results (from the past 240 hour(s))  Culture, Urine     Status: Abnormal   Collection Time: 06/02/17  1:00 AM  Result Value Ref Range Status   Specimen Description URINE, CATHETERIZED  Final   Special Requests NONE  Final   Culture (A)  Final    70,000 COLONIES/mL PSEUDOMONAS AERUGINOSA 80,000 COLONIES/mL VIRIDANS STREPTOCOCCUS CALL MICROBIOLOGY LAB IF SENSITIVITIES ARE REQUIRED.    Report Status 06/05/2017 FINAL  Final   Organism ID, Bacteria PSEUDOMONAS AERUGINOSA (A)  Final      Susceptibility   Pseudomonas aeruginosa - MIC*    CEFTAZIDIME 8 SENSITIVE Sensitive     CIPROFLOXACIN <=0.25 SENSITIVE Sensitive     GENTAMICIN <=1 SENSITIVE Sensitive     IMIPENEM 2 SENSITIVE Sensitive     PIP/TAZO 16 SENSITIVE Sensitive     CEFEPIME 4 SENSITIVE Sensitive     * 70,000 COLONIES/mL PSEUDOMONAS AERUGINOSA  Culture, blood (Routine X 2) w Reflex to ID Panel     Status: None   Collection Time: 06/02/17  5:22 PM  Result Value Ref Range Status   Specimen Description BLOOD LEFT ARM  Final   Special Requests   Final    BOTTLES DRAWN AEROBIC AND ANAEROBIC Blood Culture adequate volume   Culture NO GROWTH 5 DAYS  Final   Report Status 06/07/2017 FINAL  Final  Culture, blood (Routine X 2) w Reflex to ID Panel     Status: None   Collection Time: 06/02/17  6:24 PM  Result Value Ref Range Status   Specimen Description BLOOD RIGHT ARM  Final    Special Requests IN PEDIATRIC BOTTLE Blood Culture adequate volume  Final   Culture NO GROWTH 5 DAYS  Final   Report Status 06/07/2017 FINAL  Final  Surgical pcr screen     Status: None   Collection Time: 06/09/17  1:23 PM  Result Value Ref Range Status   MRSA, PCR NEGATIVE NEGATIVE Final   Staphylococcus aureus NEGATIVE NEGATIVE Final    Comment:        The Xpert SA Assay (FDA approved for NASAL specimens in patients over 44 years of age), is one component of a  comprehensive surveillance program.  Test performance has been validated by [facility] for patients greater than or equal to 76 year old. It is not intended to diagnose infection nor to guide or monitor treatment.     Radiology Studies: Dg Lumbar Spine 2-3 Views  Result Date: 06/09/2017 CLINICAL DATA:  Elective surgery.  Lumbar fusion. EXAM: DG C-ARM 61-120 MIN; LUMBAR SPINE - 2-3 VIEW COMPARISON:  Preoperative lumbar myelogram 06/07/2017 FINDINGS: Frontal and lateral coned fluoroscopic spot views of the lumbar spine. Two level posterior fusion with intrapedicular screws, likely L5-S1 level, however levels not well-defined given body habitus. There is an interbody spacer. Please reference operative report for more details. Total fluoroscopy time reported 8 seconds. IMPRESSION: Procedural fluoroscopy for lumbar fusion. Electronically Signed   By: Jeb Levering M.D.   On: 06/09/2017 16:10   Dg C-arm 1-60 Min  Result Date: 06/09/2017 CLINICAL DATA:  Elective surgery.  Lumbar fusion. EXAM: DG C-ARM 61-120 MIN; LUMBAR SPINE - 2-3 VIEW COMPARISON:  Preoperative lumbar myelogram 06/07/2017 FINDINGS: Frontal and lateral coned fluoroscopic spot views of the lumbar spine. Two level posterior fusion with intrapedicular screws, likely L5-S1 level, however levels not well-defined given body habitus. There is an interbody spacer. Please reference operative report for more details. Total fluoroscopy time reported 8 seconds. IMPRESSION:  Procedural fluoroscopy for lumbar fusion. Electronically Signed   By: Jeb Levering M.D.   On: 06/09/2017 16:10    Scheduled Meds: . aspirin EC  81 mg Oral Q1200  . docusate sodium  100 mg Oral BID  . estradiol  1 Applicatorful Vaginal QHS  . iopamidol  20 mL Intra-articular Once  . metoprolol succinate  50 mg Oral Daily  . pantoprazole  40 mg Oral Daily  . pravastatin  40 mg Oral Q1200  . senna  1 tablet Oral BID  . sodium chloride flush  3 mL Intravenous Q12H  . trimethoprim  100 mg Oral Daily  . Vitamin D (Ergocalciferol)  50,000 Units Oral Q7 days   Continuous Infusions: . sodium chloride    . lactated ringers 10 mL/hr at 06/09/17 1134  . lactated ringers    . methocarbamol (ROBAXIN)  IV      LOS: 6 days   Time spent: 25 minutes   Faye Ramsay, MD [facility] Pager [phone]  If 7PM-7AM, please contact night-coverage [email or web address] Password [facility] 06/11/2017, 9:49 AM

## 2017-06-11 NOTE — Progress Notes (Signed)
Patient on home CPAP unit. RT put sterile water in water chamber and placed machine within reach of patient. RT informed patient if she has any trouble have RN contact RT.

## 2017-06-12 LAB — CBC
HCT: 33 % — ABNORMAL LOW (ref 36.0–46.0)
HEMOGLOBIN: 10.4 g/dL — AB (ref 12.0–15.0)
MCH: 27.2 pg (ref 26.0–34.0)
MCHC: 31.5 g/dL (ref 30.0–36.0)
MCV: 86.4 fL (ref 78.0–100.0)
Platelets: 282 10*3/uL (ref 150–400)
RBC: 3.82 MIL/uL — ABNORMAL LOW (ref 3.87–5.11)
RDW: 17.9 % — AB (ref 11.5–15.5)
WBC: 13.7 10*3/uL — ABNORMAL HIGH (ref 4.0–10.5)

## 2017-06-12 LAB — BASIC METABOLIC PANEL
Anion gap: 6 (ref 5–15)
BUN: 8 mg/dL (ref 6–20)
CALCIUM: 8.5 mg/dL — AB (ref 8.9–10.3)
CO2: 29 mmol/L (ref 22–32)
Chloride: 103 mmol/L (ref 101–111)
Creatinine, Ser: 0.64 mg/dL (ref 0.44–1.00)
GFR calc non Af Amer: >60 mL/min (ref >60)
Glucose, Bld: 127 mg/dL — ABNORMAL HIGH (ref 65–99)
Potassium: 4.2 mmol/L (ref 3.5–5.1)
SODIUM: 138 mmol/L (ref 135–145)

## 2017-06-12 MED FILL — Gelatin Absorbable MT Powder: OROMUCOSAL | Qty: 1 | Status: AC

## 2017-06-12 MED FILL — Sodium Chloride IV Soln 0.9%: INTRAVENOUS | Qty: 1000 | Status: AC

## 2017-06-12 MED FILL — Heparin Sodium (Porcine) Inj 1000 Unit/ML: INTRAMUSCULAR | Qty: 30 | Status: AC

## 2017-06-12 MED FILL — Thrombin For Soln 5000 Unit: CUTANEOUS | Qty: 5000 | Status: AC

## 2017-06-12 NOTE — Care Management Note (Signed)
Case Management Note  Patient Details  Name: [name] MRN: [id number] Date of Birth: 10-02-1956  Subjective/Objective:   61 yr old female admitted with back pain, on 8/16 underwent  L5-S1 decompression  and fusion with removal of hardware L4-5.               Action/Plan: Case manager spoke with patient this morning concerning discharge plan and DME needs. Choice was offered previously, referral was called to Stevie Kern, [place] Liaison.  We had previously discussed need for hospital bed with trapeze and that has been requested. Will be delivered on date of discharge. Patient says her sister  Will assist her during the week and her dad and brother assist on weekends. CM will continue to Follow.    Expected Discharge Date:   pending               Expected Discharge Plan:  [place]  In-House Referral:  NA  Discharge planning Services  CM Consult  Post Acute Care Choice:  Durable Medical Equipment Choice offered to:  Patient  DME Arranged:  3-N-1, Walker rolling, Hospital bed, Trapeze DME Agency:  [place] Arranged:  PT, OT [facility] Agency:  [place]  Status of Service:  Completed, signed off  If discussed at [place] of Stay Meetings, dates discussed:    Additional Comments:  Ninfa Meeker, RN 06/12/2017, 11:49 AM

## 2017-06-12 NOTE — Progress Notes (Signed)
Physical Therapy Treatment Patient Details Name: [name] MRN: [id number] DOB: Aug 18, 1956 Today's Date: 06/12/2017    History of Present Illness [name] is a 61 y.o. female with medical history significant for asthma, COPD, hypertension, CAD status post MI, history of PE on Coumadin, chronic back pain, admitted with right low back pain and L4-S1 stenosis s/p steroid injection S1 06/04/17. Hannah now s/p L5-S1 PLIF with removal of hardware L4-5 on 06/09/17. now s/p  L5-S1 decompression and fusion    [name]    Hannah remains limited and guarded due to pain.  Hannah performed limited gait and required max cues for encouragement.  Will f/u next session to advance mobility and decrease assist needed to perform functional movements.      Follow Up Recommendations  Home health Hannah;Supervision/Assistance - 24 hour     Equipment Recommendations  Rolling walker with 5" wheels    Recommendations for Other Services       Precautions / Restrictions Precautions Precautions: Fall;Back Precaution Booklet Issued: Yes (comment) Precaution Montoya: Educated Hannah concerning back precautions. She was able to recall 3/3. Required Braces or Orthoses: Spinal Brace Spinal Brace: Lumbar corset;Applied in sitting position Restrictions Weight Bearing Restrictions: No    Mobility  Bed Mobility Overal bed mobility: Needs Assistance             General bed mobility Montoya: Hannah sitting edge of bed on arrival with OT present performing Lower body dressing with adpative equipment  Transfers Overall transfer level: Needs assistance Equipment used: Rolling walker (2 wheeled) Transfers: Sit to/from Stand Sit to Stand: Min assist;+2 safety/equipment         General transfer comment: Min assist to boost into standing.  Cues for hand placement and technique.  Hannah slow and gaurded  Ambulation/Gait Ambulation/Gait assistance: Min guard Ambulation Distance (Feet): 20 Feet Assistive device:  Rolling walker (2 wheeled) Gait Pattern/deviations: Step-to pattern;Wide base of support;Shuffle;Decreased stride length;Trunk flexed Gait velocity: slow Gait velocity interpretation: Below normal speed for age/gender General Gait Details: Cues for upper trunk control reports R knee feeling weak.  Hannah required cues for sequecing and intermittent assistance turning RW, Hannah reports UEs fatigue as well.     Stairs            Wheelchair Mobility    Modified Rankin (Stroke Patients Only)       Balance     Sitting balance-Leahy Scale: Fair Sitting balance - Montoya: sitting unsupported with use of sock aid.  OT required to bring handels to patient limited reaching observed within BOS and not outside BOS.       Standing balance-Leahy Scale: Poor Standing balance comment: reliant on walker. Difficult weightbearing on the left UE                             Cognition Arousal/Alertness: Awake/alert Behavior During Therapy: WFL for tasks assessed/performed Overall Cognitive Status: Within Functional Limits for tasks assessed                                        Exercises      General Montoya        Pertinent Vitals/Pain Pain Assessment: 0-10 Pain Score: 8  Pain Location: back to bilateral hips, RLE pain increased with walking Pain Descriptors / Indicators: Sharp;Radiating;Spasm Pain Intervention(s): Monitored during session;Repositioned;Ice applied  Home Living                      Prior Function            Hannah Goals (current goals can now be found in the care plan section) Acute Rehab Hannah Goals Patient Stated Goal: to get stronger  Potential to Achieve Goals: Good Additional Goals Additional Goal #1: Hannah to perform pericare mod I with tongs as an extention of her reach to minimize back pain. Progress towards Hannah goals: Progressing toward goals    Frequency    Min 5X/week      Hannah Plan Current plan remains appropriate     Co-evaluation Hannah/OT/SLP Co-Evaluation/Treatment: Yes Reason for Co-Treatment: Necessary to address cognition/behavior during functional activity (Hannah remains limited in her mobility and likley would not tolerate additional tx.  ) OT in room prior to PTA arriving.           AM-PAC Hannah "6 Clicks" Daily Activity  Outcome Measure  Difficulty turning over in bed (including adjusting bedclothes, sheets and blankets)?: Unable Difficulty moving from lying on back to sitting on the side of the bed? : Unable Difficulty sitting down on and standing up from a chair with arms (e.g., wheelchair, bedside commode, etc,.)?: Unable Help needed moving to and from a bed to chair (including a wheelchair)?: A Little Help needed walking in hospital room?: A Little Help needed climbing 3-5 steps with a railing? : A Lot 6 Click Score: 11    End of Session Equipment Utilized During Treatment: Back brace;Gait belt Activity Tolerance: Patient tolerated treatment well;Patient limited by pain Patient left: in chair;with call bell/phone within reach Nurse Communication: Mobility status (informed nursing to limit sitting 30 min to 1 hour.  ) Hannah Visit Diagnosis: Pain;Other symptoms and signs involving the nervous system (R29.898);Other abnormalities of gait and mobility (R26.89);Difficulty in walking, not elsewhere classified (R26.2);Muscle weakness (generalized) (M62.81) Pain - Right/Left: Right Pain - part of body: Leg (back)     Time: 1533-1550 Hannah Time Calculation (min) (ACUTE ONLY): 17 min  Charges:  $Gait Training: 8-22 mins                    G Codes:       [name], PTA pager [phone]    [name] 06/12/2017, 4:07 PM

## 2017-06-12 NOTE — Progress Notes (Signed)
PROGRESS NOTE  [name] PJA:[id number] DOB: Mar 23, 1956 DOA: 06/02/2017   PCP: Thressa Sheller, MD  Brief Narrative: 61 year old woman PMH COPD, coronary artery and a disease, pulmonary embolism on warfarin, chronic back pain presented with worsening back pain, right leg sciatica and intermittent paresthesia. Admitted for intractable pain, neurosurgery evaluation.  Assessment/Plan  Intractable lower back pain - with associated sciatica and intermittent paresthesias, secondary to spinal stenosis pain.  - Status post transforaminal block with minimal response 8/13 - per neurosurgery, worse area of stenosis is at L5-S1 causing severe compromise of the right sided Sq nerve root - pt is no post op day #3, status post L5-S1 interbody fusion with removal of hardware L4-5 - pt making expected recovery  - PT/OT eval done, pt declining SNF, will go home with home health PT, OT, RN - pain is still huge issue for patients with therefore not able to discharge yet  Leukocytosis - likely reactive - WBC slightly up this morning, will repeat again in the am  Post op blood loss anemia - hemoglobin overall stable, no evidence of active bleeding  Dyspnea 8/16 - CXR with no evidence of PNA - respiratory status stable  PMH pulmonary embolism, DVT - resume Coumadin  PMH chronic UTI, asymptomatic bacteriuria - Patient asymptomatic. Continue trimethoprim.  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 - respiratory status stable  Essential hypertension - overall improving - continue to hold losartan - holding parameters on Metoprolol   Morbid obesity class III. - Body mass index is 49.95 kg/m.  DVT prophylaxis: SCD's Code Status: Full  Family Communication: pt at bedside  Disposition Plan: home when neurosurgery team clear  Consultants:  Neurosurgery  IR  Procedures:  8/12 Right S1 nerve root epidural steroid injection performed for Right  S1 radiculopathy secondary to stenosis at L5/S1  8/17 Removal of [place] hardware L4-L5, decompression of L5-S1 via laminectomy and total discectomy with posterior lumbar interbody arthrodesis using peek spacer local autograft and allograft pedicle screw fixation L5-S1 posterior lateral arthrodesis with local autograft and allograft L5-S1  Antimicrobials:  Trimethoprim   Subjective: Patient with persistent back pain.  Objective: Vitals:   06/11/17 2127 06/11/17 2144 06/11/17 2155 06/12/17 0300  BP:  (!) 98/49 (!) 146/77 (!) 107/42  Pulse: 84 84 91 88  Resp: 17 18 18 18   Temp: 98 F (36.7 C) 98 F (36.7 C) 99 F (37.2 C) 99 F (37.2 C)  TempSrc: Oral Oral Oral Oral  SpO2: 95% 100% 95% 95%  Weight:      Height:        Intake/Output Summary (Last 24 hours) at 06/12/17 1037 Last data filed at 06/12/17 0700  Gross per 24 hour  Intake              480 ml  Output             2000 ml  Net            -1520 ml   Filed Weights   06/05/17 0500 06/07/17 0500 06/11/17 0500  Weight: 127.5 kg (281 lb 1.6 oz) 126 kg (277 lb 12.8 oz) 127.9 kg (282 lb)   Physical Exam  Constitutional: Appears well-developed and well-nourished. No distress.  CVS: RRR, S1/S2 +, no murmurs, no gallops, no carotid bruit.  Pulmonary: Effort and breath sounds normal, no stridor, rhonchi, wheezes, rales.  Abdominal: Soft. BS +,  no distension, tenderness, rebound or guarding.  Musculoskeletal: tenderness to palpation in the back  area at the intervention site  Data Reviewed: I have personally reviewed following labs and imaging studies  CBC:  Recent Labs Lab 06/07/17 0525 06/09/17 0346 06/10/17 0228 06/11/17 0444 06/12/17 0424  WBC 10.5 12.7* 19.8* 12.6* 13.7*  HGB 12.3 14.1 11.3* 9.4* 10.4*  HCT 40.0 44.4 36.6 30.5* 33.0*  MCV 84.7 84.6 85.1 86.6 86.4  PLT 287 346 316 217 233   Basic Metabolic Panel:  Recent Labs Lab 06/07/17 0525 06/09/17 0346 06/10/17 0228 06/11/17 0444 06/12/17 0424    NA 140 139 137 138 138  K 4.0 4.5 4.0 3.6 4.2  CL 104 101 101 104 103  CO2 26 28 26 27 29   GLUCOSE 116* 104* 143* 131* 127*  BUN 7 9 9 11 8   CREATININE 0.93 0.80 0.85 0.67 0.64  CALCIUM 8.7* 9.6 8.5* 7.9* 8.5*   Urine analysis:    Component Value Date/Time   COLORURINE YELLOW 06/02/2017 1440   APPEARANCEUR HAZY (A) 06/02/2017 1440   LABSPEC 1.024 06/02/2017 1440   PHURINE 5.0 06/02/2017 1440   GLUCOSEU NEGATIVE 06/02/2017 1440   HGBUR NEGATIVE 06/02/2017 1440   BILIRUBINUR NEGATIVE 06/02/2017 1440   KETONESUR NEGATIVE 06/02/2017 1440   PROTEINUR NEGATIVE 06/02/2017 1440   UROBILINOGEN 1.0 04/15/2013 2331   NITRITE NEGATIVE 06/02/2017 1440   LEUKOCYTESUR NEGATIVE 06/02/2017 1440    Recent Results (from the past 240 hour(s))  Culture, blood (Routine X 2) w Reflex to ID Panel     Status: None   Collection Time: 06/02/17  5:22 PM  Result Value Ref Range Status   Specimen Description BLOOD LEFT ARM  Final   Special Requests   Final    BOTTLES DRAWN AEROBIC AND ANAEROBIC Blood Culture adequate volume   Culture NO GROWTH 5 DAYS  Final   Report Status 06/07/2017 FINAL  Final  Culture, blood (Routine X 2) w Reflex to ID Panel     Status: None   Collection Time: 06/02/17  6:24 PM  Result Value Ref Range Status   Specimen Description BLOOD RIGHT ARM  Final   Special Requests IN PEDIATRIC BOTTLE Blood Culture adequate volume  Final   Culture NO GROWTH 5 DAYS  Final   Report Status 06/07/2017 FINAL  Final  Surgical pcr screen     Status: None   Collection Time: 06/09/17  1:23 PM  Result Value Ref Range Status   MRSA, PCR NEGATIVE NEGATIVE Final   Staphylococcus aureus NEGATIVE NEGATIVE Final    Comment:        The Xpert SA Assay (FDA approved for NASAL specimens in patients over 12 years of age), is one component of a comprehensive surveillance program.  Test performance has been validated by [facility] for patients greater than or equal to 49 year old. It is not  intended to diagnose infection nor to guide or monitor treatment.     Radiology Studies: No results found.  Scheduled Meds: . aspirin EC  81 mg Oral Q1200  . docusate sodium  100 mg Oral BID  . estradiol  1 Applicatorful Vaginal QHS  . iopamidol  20 mL Intra-articular Once  . metoprolol succinate  50 mg Oral Daily  . pantoprazole  40 mg Oral Daily  . pravastatin  40 mg Oral Q1200  . senna  1 tablet Oral BID  . sodium chloride flush  3 mL Intravenous Q12H  . trimethoprim  100 mg Oral Daily  . Vitamin D (Ergocalciferol)  50,000 Units Oral Q7 days   Continuous  Infusions: . sodium chloride    . lactated ringers 10 mL/hr at 06/09/17 1134  . methocarbamol (ROBAXIN)  IV      LOS: 7 days   Time spent: 25 minutes  Faye Ramsay, MD [facility] Pager [phone]  If 7PM-7AM, please contact night-coverage [email or web address] Password [facility] 06/12/2017, 10:37 AM

## 2017-06-12 NOTE — Progress Notes (Signed)
Postoperative day 3. Patient progressing slowly following lumbar decompression and fusion surgery. Feel that more back pain and some recurrent right lower extremity pain today. Having no left-sided symptoms. She is working with therapy.  On examination she is awake and alert. She is oriented and appropriate. Motor examination intact bilaterally sensory examination with some mild decrease a right-sided S1 sensory loss. Wound clean and dry. Chest and abdomen benign.  Progressing slowly following lumbar decompression and fusion. Continue efforts at mobilization.

## 2017-06-12 NOTE — Care Management Important Message (Signed)
Important Message  Patient Details  Name: [name] MRN: [id number] Date of Birth: Apr 14, 1956   Medicare Important Message Given:  Yes    Clevie Prout [address] 06/12/2017, 12:42 PM

## 2017-06-12 NOTE — Progress Notes (Signed)
Occupational Therapy Treatment Patient Details Name: [name] MRN: [id number] DOB: [birth year] Today's Date: 06/12/2017    History of present illness [name] is a 61 y.o. female with medical history significant for asthma, COPD, hypertension, CAD status post MI, history of PE on Coumadin, chronic back pain, admitted with right low back pain and L4-S1 stenosis s/p steroid injection S1 06/04/17. Pt now s/p L5-S1 PLIF with removal of hardware L4-5 on 06/09/17. now s/p  L5-S1 decompression and fusion   OT comments  Pt continuing to demonstrate decreased activity tolerance and occupational performance. Pt reporting significant pain; notified RN. Provided education on LB dressing with AE; pt will need further education for donning/doffing underwear and pants with AE. Continue to recommend dc with post-acute rehab to optimize pt safety and independence with ADLs and functional mobility. Will continue to follow acutely to facilitate safe dc.   Follow Up Recommendations  Home health OT;Supervision/Assistance - 24 hour;DC plan and follow up therapy as arranged by surgeon (Pt declining SNF )    Equipment Recommendations  3 in 1 bedside commode    Recommendations for Other Services      Precautions / Restrictions Precautions Precautions: Fall;Back Precaution Booklet Issued: No Precaution Comments: Educated pt concerning back precautions. She was able to recall 3/3. Required Braces or Orthoses: Spinal Brace Spinal Brace: Lumbar corset;Applied in sitting position Restrictions Weight Bearing Restrictions: No       Mobility Bed Mobility Overal bed mobility: Needs Assistance Bed Mobility: Rolling;Sidelying to Sit Rolling: Min assist (Used pad to assist with rollign due to pain) Sidelying to sit: Min guard     Sit to sidelying: Min assist General bed mobility comments: Provided Min A to roll towards L due to pt's significant pain upon arrival. Pt then pushed into sitting with  Min A   Transfers Overall transfer level: Needs assistance Equipment used: Rolling walker (2 wheeled) Transfers: Sit to/from Stand Sit to Stand: Min assist;+2 safety/equipment         General transfer comment: Min assist to boost into standing.  Cues for hand placement and technique.  pt slow and gaurded    Balance Overall balance assessment: Needs assistance Sitting-balance support: Feet unsupported;Bilateral upper extremity supported Sitting balance-Leahy Scale: Fair Sitting balance - Comments: sitting unsupported with use of sock aid.  OT required to bring handels to patient limited reaching observed within BOS and not outside BOS.     Standing balance support: Bilateral upper extremity supported;During functional activity Standing balance-Leahy Scale: Poor Standing balance comment: reliant on walker. Difficult weightbearing on the left UE                            ADL either performed or assessed with clinical judgement   ADL Overall ADL's : Needs assistance/impaired               Lower Body Bathing Details (indicate cue type and reason): Educated pt on AE for LB bathing Upper Body Dressing : Moderate assistance;Sitting Upper Body Dressing Details (indicate cue type and reason): Mod A to don brace while sitting EOB Lower Body Dressing: Sitting/lateral leans;Minimal assistance;With adaptive equipment;Cueing for sequencing;Adhering to back precautions Lower Body Dressing Details (indicate cue type and reason): Pt donned socks with AE. Pt will need further education and practice for donning pants/underwear             Functional mobility during ADLs: Minimal assistance;Rolling walker General ADL Comments: Pt limited due  to significant pain. Started education for LB ADLs with AE. Pt will need further education before transitioning home     Vision       Perception     Praxis      Cognition Arousal/Alertness: Awake/alert Behavior During Therapy:  WFL for tasks assessed/performed Overall Cognitive Status: Within Functional Limits for tasks assessed                                          Exercises     Shoulder Instructions       General Comments Notified RN that pt unable to handle longer than 1 hour in chair due to back surgery    Pertinent Vitals/ Pain       Pain Assessment: 0-10 Pain Score: 8  Pain Location: back to bilateral hips, RLE pain increased with walking Pain Descriptors / Indicators: Sharp;Radiating;Spasm Pain Intervention(s): Monitored during session;Limited activity within patient's tolerance;Repositioned;Patient requesting pain meds-RN notified  Home Living                                          Prior Functioning/Environment              Frequency  Min 2X/week        Progress Toward Goals  OT Goals(current goals can now be found in the care plan section)  Progress towards OT goals: Progressing toward goals  Acute Rehab OT Goals Patient Stated Goal: to get stronger  OT Goal Formulation: With patient Time For Goal Achievement: 06/24/17 Potential to Achieve Goals: Good ADL Goals Pt Will Perform Upper Body Dressing: with supervision;sitting Pt Will Perform Lower Body Dressing: with min assist;with adaptive equipment;sit to/from stand Pt Will Transfer to Toilet: with supervision;ambulating;bedside commode (BSC over toilet) Pt Will Perform Toileting - Clothing Manipulation and hygiene: with supervision;with adaptive equipment;sit to/from stand Additional ADL Goal #1: Pt will complete bed mobility with overall supervision in preparation for ADL seated at EOB.  Plan Discharge plan remains appropriate    Co-evaluation    PT/OT/SLP Co-Evaluation/Treatment: Yes Reason for Co-Treatment: Necessary to address cognition/behavior during functional activity (Pt remains limited in her mobility and likley would not tole)   OT goals addressed during session: ADL's  and self-care      AM-PAC PT "6 Clicks" Daily Activity     Outcome Measure   Help from another person eating meals?: A Little Help from another person taking care of personal grooming?: A Little Help from another person toileting, which includes using toliet, bedpan, or urinal?: A Lot Help from another person bathing (including washing, rinsing, drying)?: A Lot Help from another person to put on and taking off regular upper body clothing?: A Lot Help from another person to put on and taking off regular lower body clothing?: A Lot 6 Click Score: 14    End of Session Equipment Utilized During Treatment: Back brace;Rolling walker;Gait belt  OT Visit Diagnosis: Muscle weakness (generalized) (M62.81);Pain;Other abnormalities of gait and mobility (R26.89) Pain - Right/Left: Right (back) Pain - part of body: Hip (back)   Activity Tolerance Patient limited by pain   Patient Left with call bell/phone within reach;in chair   Nurse Communication Mobility status;Other (comment) (Need to return to bed in 1 hour)        Time: 8676-1950 OT  Time Calculation (min): 31 min  Charges: OT General Charges $OT Visit: 1 Procedure OT Treatments $Self Care/Home Management : 8-22 mins  [place], OTR/L Acute Rehab Pager: [phone] Office: [place] 06/12/2017, 5:12 PM

## 2017-06-13 LAB — CBC
HCT: 31.9 % — ABNORMAL LOW (ref 36.0–46.0)
HEMOGLOBIN: 9.6 g/dL — AB (ref 12.0–15.0)
MCH: 25.7 pg — ABNORMAL LOW (ref 26.0–34.0)
MCHC: 30.1 g/dL (ref 30.0–36.0)
MCV: 85.3 fL (ref 78.0–100.0)
Platelets: 279 10*3/uL (ref 150–400)
RBC: 3.74 MIL/uL — ABNORMAL LOW (ref 3.87–5.11)
RDW: 17.1 % — AB (ref 11.5–15.5)
WBC: 13.7 10*3/uL — ABNORMAL HIGH (ref 4.0–10.5)

## 2017-06-13 LAB — BASIC METABOLIC PANEL
Anion gap: 6 (ref 5–15)
BUN: 8 mg/dL (ref 6–20)
CALCIUM: 8.6 mg/dL — AB (ref 8.9–10.3)
CO2: 30 mmol/L (ref 22–32)
CREATININE: 0.73 mg/dL (ref 0.44–1.00)
Chloride: 103 mmol/L (ref 101–111)
GFR calc non Af Amer: >60 mL/min (ref >60)
Glucose, Bld: 121 mg/dL — ABNORMAL HIGH (ref 65–99)
Potassium: 3.8 mmol/L (ref 3.5–5.1)
SODIUM: 139 mmol/L (ref 135–145)

## 2017-06-13 MED ORDER — DEXAMETHASONE SODIUM PHOSPHATE 10 MG/ML IJ SOLN
10.0000 mg | Freq: Four times a day (QID) | INTRAMUSCULAR | Status: AC
  Administered 2017-06-13 – 2017-06-14 (×6): 10 mg via INTRAVENOUS
  Filled 2017-06-13 (×6): qty 1

## 2017-06-13 MED ORDER — WARFARIN SODIUM 5 MG PO TABS
5.0000 mg | ORAL_TABLET | Freq: Once | ORAL | Status: AC
  Administered 2017-06-13: 5 mg via ORAL
  Filled 2017-06-13: qty 1

## 2017-06-13 MED ORDER — WARFARIN - PHARMACIST DOSING INPATIENT
Freq: Every day | Status: DC
  Administered 2017-06-13 – 2017-06-15 (×3)

## 2017-06-13 MED ORDER — ENOXAPARIN SODIUM 40 MG/0.4ML ~~LOC~~ SOLN
40.0000 mg | SUBCUTANEOUS | Status: DC
  Administered 2017-06-13 – 2017-06-16 (×4): 40 mg via SUBCUTANEOUS
  Filled 2017-06-13 (×4): qty 0.4

## 2017-06-13 NOTE — Progress Notes (Signed)
Patient complaining of more right lower extremity pain.  Pain limiting mobility.  No numbness.  No weakness.  No left-sided symptoms.  Lower back pain reasonably well controlled.  Wound clean and dry.    Patient with postoperative radicular irritation.  I will add some IV steroids for the next 24 hours to see if this does not lessen some of her pain.

## 2017-06-13 NOTE — Progress Notes (Signed)
PROGRESS NOTE  [name]:[id number] DOB: 13-Jun-1956 DOA: 06/02/2017   PCP: Thressa Sheller, MD  Brief Narrative: 61 year old woman PMH COPD, coronary artery and a disease, pulmonary embolism on warfarin, chronic back pain presented with worsening back pain, right leg sciatica and intermittent paresthesia. Admitted for intractable pain, neurosurgery evaluation.  Assessment/Plan  Intractable lower back pain - with associated sciatica and intermittent paresthesias, secondary to spinal stenosis pain.  - Status post transforaminal block with minimal response 8/13 - worse area of stenosis is at L5-S1 causing severe compromise of the right sided Sq nerve root - pt is no post op day #4, status post L5-S1 interbody fusion with removal of hardware L4-5 - pt making expected recovery  - PT/OT eval done, pt declining SNF, will go home with home health PT, OT, RN - pain control is still an issue, pt with more pain in RLE, pain is limiting mobility - wound site is clean and dry - per neurosurgery, add IV steroids to see if that will help with what appears to be radical irritation  - plan d/c home once pain controlled   Leukocytosis - likely reactive - WBC still up, no fevers - CBC in AM  Post op blood loss anemia - Hg slightly down in the past 24 hours - no evidence of active bleeding - CBC in AM  Dyspnea 8/16 - CXR with no evidence of PNA - resp status stable this AM  PMH pulmonary embolism, DVT - resume Coumadin once neurosurgery says it is ok   PMH chronic UTI, asymptomatic bacteriuria - Patient asymptomatic. Continue trimethoprim.  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 - resp status stable this AM   Essential hypertension - overall improving - continue to hold losartan - holding parameters on Metoprolol  - may resume losartan once BP tolerating   Morbid obesity class III. - Body mass index is 49.95 kg/m.  DVT  prophylaxis: SCD's Code Status: Full  Family Communication: pt at bedside  Disposition Plan: home when pain controlled and neurosurgery team clears   Consultants:  Neurosurgery  IR  Procedures:  8/12 Right S1 nerve root epidural steroid injection performed for Right S1 radiculopathy secondary to stenosis at L5/S1  8/17 Removal of Steffee hardware L4-L5, decompression of L5-S1 via laminectomy and total discectomy with posterior lumbar interbody arthrodesis using peek spacer local autograft and allograft pedicle screw fixation L5-S1 posterior lateral arthrodesis with local autograft and allograft L5-S1  Antimicrobials:  Trimethoprim (home regimen, prophylaxis)  Subjective: Pt with RLE pain. Less pain in lower back area.   Objective: Vitals:   06/12/17 0300 06/12/17 1300 06/12/17 2206 06/13/17 0629  BP: (!) 107/42 (!) 105/44 (!) 102/55 (!) 117/44  Pulse: 88 81 80 76  Resp: 18 18 18 18   Temp: 99 F (37.2 C) 98 F (36.7 C) 98.5 F (36.9 C) (!) 97.5 F (36.4 C)  TempSrc: Oral Oral Oral Oral  SpO2: 95% 98% 94% 97%  Weight:      Height:        Intake/Output Summary (Last 24 hours) at 06/13/17 1051 Last data filed at 06/13/17 0646  Gross per 24 hour  Intake              960 ml  Output             3900 ml  Net            -2940 ml   Filed Weights   06/05/17 0500 06/07/17  0500 06/11/17 0500  Weight: 127.5 kg (281 lb 1.6 oz) 126 kg (277 lb 12.8 oz) 127.9 kg (282 lb)   Physical Exam  Constitutional: Appears calm, NAD CVS: RRR, S1/S2 +, no murmurs, no gallops, no carotid bruit.  Pulmonary: Effort and breath sounds normal, no stridor, rhonchi, diminished breath sounds at bases  Abdominal: Soft. BS +,  no distension, tenderness, rebound or guarding.  Musculoskeletal: pain in RLE limiting movement, good ROM on the left side, TTP in the low back area rather mild  Data Reviewed: I have personally reviewed following labs and imaging studies  CBC:  Recent Labs Lab  06/09/17 0346 06/10/17 0228 06/11/17 0444 06/12/17 0424 06/13/17 0303  WBC 12.7* 19.8* 12.6* 13.7* 13.7*  HGB 14.1 11.3* 9.4* 10.4* 9.6*  HCT 44.4 36.6 30.5* 33.0* 31.9*  MCV 84.6 85.1 86.6 86.4 85.3  PLT 346 316 217 282 888   Basic Metabolic Panel:  Recent Labs Lab 06/09/17 0346 06/10/17 0228 06/11/17 0444 06/12/17 0424 06/13/17 0303  NA 139 137 138 138 139  K 4.5 4.0 3.6 4.2 3.8  CL 101 101 104 103 103  CO2 28 26 27 29 30   GLUCOSE 104* 143* 131* 127* 121*  BUN 9 9 11 8 8   CREATININE 0.80 0.85 0.67 0.64 0.73  CALCIUM 9.6 8.5* 7.9* 8.5* 8.6*   Urine analysis:    Component Value Date/Time   COLORURINE YELLOW 06/02/2017 1440   APPEARANCEUR HAZY (A) 06/02/2017 1440   LABSPEC 1.024 06/02/2017 1440   PHURINE 5.0 06/02/2017 1440   GLUCOSEU NEGATIVE 06/02/2017 1440   HGBUR NEGATIVE 06/02/2017 1440   BILIRUBINUR NEGATIVE 06/02/2017 1440   KETONESUR NEGATIVE 06/02/2017 1440   PROTEINUR NEGATIVE 06/02/2017 1440   UROBILINOGEN 1.0 04/15/2013 2331   NITRITE NEGATIVE 06/02/2017 1440   LEUKOCYTESUR NEGATIVE 06/02/2017 1440    Recent Results (from the past 240 hour(s))  Surgical pcr screen     Status: None   Collection Time: 06/09/17  1:23 PM  Result Value Ref Range Status   MRSA, PCR NEGATIVE NEGATIVE Final   Staphylococcus aureus NEGATIVE NEGATIVE Final    Comment:        The Xpert SA Assay (FDA approved for NASAL specimens in patients over 41 years of age), is one component of a comprehensive surveillance program.  Test performance has been validated by [facility] for patients greater than or equal to 29 year old. It is not intended to diagnose infection nor to guide or monitor treatment.     Radiology Studies: No results found.  Scheduled Meds: . aspirin EC  81 mg Oral Q1200  . dexamethasone  10 mg Intravenous Q6H  . docusate sodium  100 mg Oral BID  . estradiol  1 Applicatorful Vaginal QHS  . iopamidol  20 mL Intra-articular Once  . metoprolol  succinate  50 mg Oral Daily  . pantoprazole  40 mg Oral Daily  . pravastatin  40 mg Oral Q1200  . senna  1 tablet Oral BID  . sodium chloride flush  3 mL Intravenous Q12H  . trimethoprim  100 mg Oral Daily  . Vitamin D (Ergocalciferol)  50,000 Units Oral Q7 days   Continuous Infusions: . sodium chloride    . lactated ringers 10 mL/hr at 06/09/17 1134  . methocarbamol (ROBAXIN)  IV      LOS: 8 days   Time spent: 25 minutes  Faye Ramsay, MD [facility] Pager [phone]  If 7PM-7AM, please contact night-coverage [email or web address] Password [facility] 06/13/2017, 10:51 AM

## 2017-06-13 NOTE — Progress Notes (Signed)
Physical Therapy Treatment Patient Details Name: [name] MRN: [id number] DOB: 1956/03/25 Today's Date: 06/13/2017    History of Present Illness [name] is a 61 y.o. female with medical history significant for asthma, COPD, hypertension, CAD status post MI, history of PE on Coumadin, chronic back pain, admitted with right low back pain and L4-S1 stenosis s/p steroid injection S1 06/04/17. Pt now s/p L5-S1 PLIF with removal of hardware L4-5 on 06/09/17. now s/p  L5-S1 decompression and fusion    PT Comments    Pt remains to complain of pain in R knee during gait training.  Pt tolerated decreased pain during activity.  Pt reports she has assist from her son who [place] from the beach to stay with her for a few weeks.  Pt will require 24 hour assistance and supervision at all times to ensure safety at d/c.  Plan next session to increase gait distance to improve endurance and activity tolerance.   Follow Up Recommendations  Home health PT;Supervision/Assistance - 24 hour     Equipment Recommendations  Rolling walker with 5" wheels    Recommendations for Other Services       Precautions / Restrictions Precautions Precautions: Fall;Back Precaution Booklet Issued: No Precaution Comments: Educated pt concerning back precautions. She was able to recall 3/3. Required Braces or Orthoses: Spinal Brace Spinal Brace: Lumbar corset;Applied in sitting position Restrictions Weight Bearing Restrictions: No    Mobility  Bed Mobility Overal bed mobility: Needs Assistance Bed Mobility: Sit to Supine Rolling: Min assist (to R and L to achieve position in the bed.  ) Sidelying to sit: Min guard   Sit to supine: Mod assist   General bed mobility comments: Pt able to lower top half of her body but required physical assistance to lift B LEs into bed against gravity.    Transfers Overall transfer level: Needs assistance Equipment used: Rolling walker (2 wheeled) Transfers:  Sit to/from Stand Sit to Stand: Min assist (from recliner chair with increased time to complete.  ) Stand pivot transfers: Min guard       General transfer comment: Pt required cues for hand placement to and from seated surface.  Pt intially 2/3 standing when attempting to return to sitting.  Required cues for encouragement to continue with transfer.  heavy min assistance.    Ambulation/Gait Ambulation/Gait assistance: Min assist Ambulation Distance (Feet): 15 Feet (from chair around to opposite side of the bed.  ) Assistive device: Rolling walker (2 wheeled) Gait Pattern/deviations: Step-to pattern;Wide base of support;Shuffle;Decreased stride length;Trunk flexed;Decreased stance time - right Gait velocity: slow Gait velocity interpretation: Below normal speed for age/gender General Gait Details: Cues for upper trunk control reports R knee feeling weak.  Pt required cues for sequecing and intermittent assistance turning RW, pt reports UEs fatigue as well.     Stairs            Wheelchair Mobility    Modified Rankin (Stroke Patients Only)       Balance Overall balance assessment: Needs assistance Sitting-balance support: Feet unsupported;Bilateral upper extremity supported Sitting balance-Leahy Scale: Fair Sitting balance - Comments: sitting unsupported with use of sock aid.  OT required to bring handels to patient limited reaching observed within BOS and not outside BOS.     Standing balance support: Bilateral upper extremity supported;During functional activity Standing balance-Leahy Scale: Poor Standing balance comment: reliant on walker. Difficult weightbearing on the left UE  Cognition Arousal/Alertness: Awake/alert Behavior During Therapy: WFL for tasks assessed/performed Overall Cognitive Status: Within Functional Limits for tasks assessed                                        Exercises General Exercises  - Lower Extremity Ankle Circles/Pumps: AROM;Both;10 reps;Supine Quad Sets: AROM;Both;10 reps;Supine Heel Slides: AROM;Both;10 reps;Supine    General Comments        Pertinent Vitals/Pain Pain Assessment: Faces Pain Score: 8  Faces Pain Scale: Hurts even more Pain Location: back to bilateral hips, RLE pain increased with movement. Pt stating "this R leg still hurts" Pain Descriptors / Indicators: Sharp;Radiating;Spasm Pain Intervention(s): Monitored during session;Repositioned    Home Living                      Prior Function            PT Goals (current goals can now be found in the care plan section) Acute Rehab PT Goals Patient Stated Goal: to get stronger  Potential to Achieve Goals: Good Additional Goals Additional Goal #1: Pt to perform pericare mod I with tongs as an extention of her reach to minimize back pain. Progress towards PT goals: Progressing toward goals    Frequency    Min 5X/week      PT Plan Current plan remains appropriate    Co-evaluation              AM-PAC PT "6 Clicks" Daily Activity  Outcome Measure  Difficulty turning over in bed (including adjusting bedclothes, sheets and blankets)?: Unable Difficulty moving from lying on back to sitting on the side of the bed? : Unable Difficulty sitting down on and standing up from a chair with arms (e.g., wheelchair, bedside commode, etc,.)?: Unable Help needed moving to and from a bed to chair (including a wheelchair)?: A Little Help needed walking in hospital room?: A Little Help needed climbing 3-5 steps with a railing? : A Lot 6 Click Score: 11    End of Session Equipment Utilized During Treatment: Back brace;Gait belt Activity Tolerance: Patient tolerated treatment well;Patient limited by pain Patient left: in chair;with call bell/phone within reach Nurse Communication: Mobility status PT Visit Diagnosis: Pain;Other symptoms and signs involving the nervous system  (R29.898);Other abnormalities of gait and mobility (R26.89);Difficulty in walking, not elsewhere classified (R26.2);Muscle weakness (generalized) (M62.81) Pain - Right/Left: Right Pain - part of body: Leg (back)     Time: 4098-1191 PT Time Calculation (min) (ACUTE ONLY): 18 min  Charges:  $Therapeutic Activity: 8-22 mins                    G Codes:       Governor Rooks, PTA pager [phone]    Cristela Blue 06/13/2017, 1:39 PM

## 2017-06-13 NOTE — Progress Notes (Signed)
ANTICOAGULATION CONSULT NOTE - Initial Consult  Pharmacy Consult for Coumadin Indication: history of PE  Allergies  Allergen Reactions  . Latex Other (See Comments)    Stings and burns me  . Other Nausea And Vomiting and Other (See Comments)    Bleach - burns esophagus, breaks me out and burns me  . Shellfish Allergy Hives and Nausea And Vomiting  . Tape Other (See Comments)    Plastic tape irritates skin    Patient Measurements: Height: 5\' 3"  (160 cm) Weight: 282 lb (127.9 kg) IBW/kg (Calculated) : 52.4   Vital Signs: Temp: 98.8 F (37.1 C) (08/21 1300) Temp Source: Oral (08/21 1300) BP: 119/54 (08/21 1300) Pulse Rate: 76 (08/21 0629)  Labs:  Recent Labs  06/11/17 0444 06/12/17 0424 06/13/17 0303  HGB 9.4* 10.4* 9.6*  HCT 30.5* 33.0* 31.9*  PLT 217 282 279  CREATININE 0.67 0.64 0.73    Estimated Creatinine Clearance: 96.3 mL/min (by C-G formula based on SCr of 0.73 mg/dL).   Medical History: Past Medical History:  Diagnosis Date  . Acid reflux   . Asthma   . Chronic pain   . COPD (chronic obstructive pulmonary disease) ([place])   . Diverticulitis   . Heart murmur   . Hiatal hernia   . High cholesterol   . History of UTI   . Hypertension   . Labral tear of shoulder    left  . Myocardial infarction ([place]) 2014  . Pneumonia 2014  . Pulmonary embolism ([place]) last 2014   x 3   . Ruptured disc, cervical    1 in neck, 1 in midback and 1 in low back, 6 bulging discs,  upper and lower back, 5 slipped discs all the way uop and down back and   . Sleep apnea    uses cpap, pt does not know settings    Medications:  Scheduled:  . aspirin EC  81 mg Oral Q1200  . dexamethasone  10 mg Intravenous Q6H  . docusate sodium  100 mg Oral BID  . enoxaparin (LOVENOX) injection  40 mg Subcutaneous Q24H  . estradiol  1 Applicatorful Vaginal QHS  . iopamidol  20 mL Intra-articular Once  . metoprolol succinate  50 mg Oral Daily  . pantoprazole  40 mg Oral Daily  .  pravastatin  40 mg Oral Q1200  . senna  1 tablet Oral BID  . sodium chloride flush  3 mL Intravenous Q12H  . trimethoprim  100 mg Oral Daily  . Vitamin D (Ergocalciferol)  50,000 Units Oral Q7 days    Assessment: 61yo female s/p L5-S1 decompression and fusion on 8/17, now to resume Coumadin for history of PE.  She will also be receiving Lovenox 40mg  SQ q24 per Dr. Annette Stable.  INR was 1.52 on 8/12.  Goal of Therapy:  INR 2-3 Monitor platelets by anticoagulation protocol: Yes   Plan:  Coumadin 5mg  po tonight Daily protime Watch for s/s of bleeding   Lewie Chamber., PharmD [facility]

## 2017-06-13 NOTE — Progress Notes (Signed)
Occupational Therapy Treatment Patient Details Name: [name] MRN: [id number] DOB: June 08, 1956 Today's Date: 06/13/2017    History of present illness BRIGITTA Montoya is a 61 y.o. female with medical history significant for asthma, COPD, hypertension, CAD status post MI, history of PE on Coumadin, chronic back pain, admitted with right low back pain and L4-S1 stenosis s/p steroid injection S1 06/04/17. Pt now s/p L5-S1 PLIF with removal of hardware L4-5 on 06/09/17. now s/p  L5-S1 decompression and fusion   OT comments  Pt continues to have poor activity tolerance due to significant pain. Pt performed stand pivot transfer from EOB to [facility] with Min Guard A and RW. Pt requiring Mod A for toilet hygiene due to pain, decreased ROM, and back precautions. Will continue to follow acutely to facilitate safe dc and increase occupational performance. Continue to recommend post acute OT and rehab at dc.    Follow Up Recommendations  Home health OT;Supervision/Assistance - 24 hour;DC plan and follow up therapy as arranged by surgeon (Pt declining SNF )    Equipment Recommendations  3 in 1 bedside commode    Recommendations for Other Services      Precautions / Restrictions Precautions Precautions: Fall;Back Precaution Booklet Issued: No Precaution Comments: Educated pt concerning back precautions. She was able to recall 3/3. Required Braces or Orthoses: Spinal Brace Spinal Brace: Lumbar corset;Applied in sitting position Restrictions Weight Bearing Restrictions: No       Mobility Bed Mobility Overal bed mobility: Needs Assistance Bed Mobility: Rolling;Sidelying to Sit Rolling: Min assist Sidelying to sit: Min guard       General bed mobility comments: Practiced log roll to L side. Pt continues to require Min A to bring hips over and adhere to back precautions. Pt uses bedrails heavily to pull herself over  Transfers Overall transfer level: Needs assistance Equipment used:  Rolling walker (2 wheeled) Transfers: Sit to/from [facility] Sit to Stand: Min guard;From elevated surface Stand pivot transfers: Min guard       General transfer comment: Min Guard A for safety. Pt requiring VCs to weight shift forward,     Balance Overall balance assessment: Needs assistance Sitting-balance support: Feet unsupported;Bilateral upper extremity supported Sitting balance-Leahy Scale: Fair Sitting balance - Comments: sitting unsupported with use of sock aid.  OT required to bring handels to patient limited reaching observed within BOS and not outside BOS.     Standing balance support: Bilateral upper extremity supported;During functional activity Standing balance-Leahy Scale: Poor Standing balance comment: reliant on walker. Difficult weightbearing on the left UE                            ADL either performed or assessed with clinical judgement   ADL Overall ADL's : Needs assistance/impaired                 Upper Body Dressing : Minimal assistance;Sitting Upper Body Dressing Details (indicate cue type and reason): Min A to don back brace     Toilet Transfer: Min Geophysical data processor Details (indicate cue type and reason): Min guard for safety to stand pivot from EOB to [facility]. Pt requiring significant amount of time and moves slowly due to pain in R hip and leg Toileting- Clothing Manipulation and Hygiene: Sit to/from stand;With adaptive equipment;Cueing for sequencing;Cueing for back precautions;Moderate assistance Toileting - Clothing Manipulation Details (indicate cue type and reason): Mod A to perform toilet hygiene for BM due to  pt's limited ROM. Also required Assistance to manage clothing     Functional mobility during ADLs: Min guard;Rolling walker General ADL Comments: Pt limited due to significant pain. Performed toilet transfer to [facility]. Pt would benefit from use of BSC at home; pt confirms that she has bsc  at home.     Vision       Perception     Praxis      Cognition Arousal/Alertness: Awake/alert Behavior During Therapy: WFL for tasks assessed/performed Overall Cognitive Status: Within Functional Limits for tasks assessed                                          Exercises     Shoulder Instructions       General Comments      Pertinent Vitals/ Pain       Pain Assessment: Faces Faces Pain Scale: Hurts even more Pain Location: back to bilateral hips, RLE pain increased with movement. Pt stating "this R leg pain in unbearable" Pain Descriptors / Indicators: Sharp;Radiating;Spasm Pain Intervention(s): Monitored during session;Limited activity within patient's tolerance;Repositioned;Patient requesting pain meds-RN notified;Premedicated before session  Home Living                                          Prior Functioning/Environment              Frequency  Min 2X/week        Progress Toward Goals  OT Goals(current goals can now be found in the care plan section)  Progress towards OT goals: Progressing toward goals  Acute Rehab OT Goals Patient Stated Goal: to get stronger  OT Goal Formulation: With patient Time For Goal Achievement: 06/24/17 Potential to Achieve Goals: Good ADL Goals Pt Will Perform Upper Body Dressing: with supervision;sitting Pt Will Perform Lower Body Dressing: with min assist;with adaptive equipment;sit to/from stand Pt Will Transfer to Toilet: with supervision;ambulating;bedside commode (BSC over toilet) Pt Will Perform Toileting - Clothing Manipulation and hygiene: with supervision;with adaptive equipment;sit to/from stand Additional ADL Goal #1: Pt will complete bed mobility with overall supervision in preparation for ADL seated at EOB.  Plan Discharge plan remains appropriate    Co-evaluation                 AM-PAC PT "6 Clicks" Daily Activity     Outcome Measure   Help from another  person eating meals?: A Little Help from another person taking care of personal grooming?: A Little Help from another person toileting, which includes using toliet, bedpan, or urinal?: A Lot Help from another person bathing (including washing, rinsing, drying)?: A Lot Help from another person to put on and taking off regular upper body clothing?: A Lot Help from another person to put on and taking off regular lower body clothing?: A Lot 6 Click Score: 14    End of Session Equipment Utilized During Treatment: Back brace;Rolling walker;Gait belt  OT Visit Diagnosis: Muscle weakness (generalized) (M62.81);Pain;Other abnormalities of gait and mobility (R26.89) Pain - Right/Left: Right (back) Pain - part of body: Hip (back)   Activity Tolerance Patient limited by pain   Patient Left with call bell/phone within reach;in chair   Nurse Communication Mobility status;Other (comment);Patient requests pain meds (Large BM)        Time: 9562-1308  OT Time Calculation (min): 31 min  Charges: OT General Charges $OT Visit: 1 Procedure OT Treatments $Self Care/Home Management : 23-37 mins  [place], OTR/L Acute Rehab Pager: [phone] Office: [place] 06/13/2017, 12:28 PM

## 2017-06-13 NOTE — Progress Notes (Signed)
Patient has home unit. RT put sterile water in water chamber and placed CPAP within reach of patient. Patient places self on and off when ready.

## 2017-06-14 LAB — BASIC METABOLIC PANEL
Anion gap: 10 (ref 5–15)
BUN: 8 mg/dL (ref 6–20)
CHLORIDE: 102 mmol/L (ref 101–111)
CO2: 25 mmol/L (ref 22–32)
CREATININE: 0.68 mg/dL (ref 0.44–1.00)
Calcium: 9.2 mg/dL (ref 8.9–10.3)
GFR calc Af Amer: >60 mL/min (ref >60)
GFR calc non Af Amer: >60 mL/min (ref >60)
GLUCOSE: 191 mg/dL — AB (ref 65–99)
Potassium: 3.8 mmol/L (ref 3.5–5.1)
SODIUM: 137 mmol/L (ref 135–145)

## 2017-06-14 LAB — CBC
HEMATOCRIT: 34.2 % — AB (ref 36.0–46.0)
HEMOGLOBIN: 10.6 g/dL — AB (ref 12.0–15.0)
MCH: 25.9 pg — ABNORMAL LOW (ref 26.0–34.0)
MCHC: 31 g/dL (ref 30.0–36.0)
MCV: 83.6 fL (ref 78.0–100.0)
Platelets: 363 10*3/uL (ref 150–400)
RBC: 4.09 MIL/uL (ref 3.87–5.11)
RDW: 16.7 % — ABNORMAL HIGH (ref 11.5–15.5)
WBC: 15.9 10*3/uL — AB (ref 4.0–10.5)

## 2017-06-14 LAB — PROTIME-INR
INR: 1.03
PROTHROMBIN TIME: 13.5 s (ref 11.4–15.2)

## 2017-06-14 MED ORDER — WARFARIN SODIUM 5 MG PO TABS
5.0000 mg | ORAL_TABLET | Freq: Once | ORAL | Status: AC
  Administered 2017-06-14: 5 mg via ORAL
  Filled 2017-06-14: qty 1

## 2017-06-14 NOTE — Progress Notes (Signed)
Physical Therapy Treatment Patient Details Name: [name] MRN: [id number] DOB: 08/07/1956 Today's Date: 06/14/2017    History of Present Illness [name] is a 61 y.o. female with medical history significant for asthma, COPD, hypertension, CAD status post MI, history of PE on Coumadin, chronic back pain, admitted with right low back pain and L4-S1 stenosis s/p steroid injection S1 06/04/17. Pt now s/p L5-S1 PLIF with removal of hardware L4-5 on 06/09/17. now s/p  L5-S1 decompression and fusion    PT Comments    Pt performed nueral stretches in supine and sidelying position in effort to reduce and allviate pain In R side.  Pt reports pain does not get better or worse therefore she opted to refused OOB at this time.  Plan next session for OOB mobility.     Follow Up Recommendations  Home health PT;Supervision/Assistance - 24 hour     Equipment Recommendations  Rolling walker with 5" wheels    Recommendations for Other Services       Precautions / Restrictions Precautions Precautions: Fall;Back Precaution Booklet Issued: No Precaution Comments: Educated pt concerning back precautions. She was able to recall 3/3. Required Braces or Orthoses: Spinal Brace Spinal Brace: Lumbar corset Restrictions Weight Bearing Restrictions: No    Mobility  Bed Mobility Overal bed mobility: Needs Assistance             General bed mobility comments: Performed rolling and boosting to HOB.  Pt required min-mod assist for rolling and min guard to boost to [facility] with bed placed in trendelenberg.  Pt refused to sit EOB at this time.    Transfers Overall transfer level:  (Refused OOB due to R thigh/hip pain.  )                  Ambulation/Gait                 Stairs            Wheelchair Mobility    Modified Rankin (Stroke Patients Only)       Balance                                            Cognition Arousal/Alertness:  Awake/alert Behavior During Therapy: WFL for tasks assessed/performed Overall Cognitive Status: Within Functional Limits for tasks assessed                                        Exercises Other Exercises Other Exercises: abdominal sets: 1x10 reps: performed in supine. Other Exercises: neural stretching: L side lying, stretching performed to R side: (1st) with B knees bent, pt performed knee extension /dorsiflexion then returned to start 1x10 reps. (2nd) with B LE extended pt performed knee flexion 1x10 reps.     Other Exercises: Neural stretch in Supine to R LE SLRs with 5 ankle pumps then return to start.  1x10 reps performed.      General Comments        Pertinent Vitals/Pain Pain Assessment: 0-10 Pain Score: 10-Worst pain ever Pain Location: back to bilateral hips, RLE pain increased with movement. Pt stating "this R leg still hurts" Pain Descriptors / Indicators: Sharp;Radiating;Spasm Pain Intervention(s): Monitored during session;Repositioned    Home Living  Prior Function            PT Goals (current goals can now be found in the care plan section) Acute Rehab PT Goals Patient Stated Goal: to get stronger  Additional Goals Additional Goal #1: Pt to perform pericare mod I with tongs as an extention of her reach to minimize back pain. Progress towards PT goals: Progressing toward goals    Frequency    Min 5X/week      PT Plan Current plan remains appropriate    Co-evaluation              AM-PAC PT "6 Clicks" Daily Activity  Outcome Measure  Difficulty turning over in bed (including adjusting bedclothes, sheets and blankets)?: Unable Difficulty moving from lying on back to sitting on the side of the bed? : Unable Difficulty sitting down on and standing up from a chair with arms (e.g., wheelchair, bedside commode, etc,.)?: Unable Help needed moving to and from a bed to chair (including a wheelchair)?: A  Lot Help needed walking in hospital room?: A Lot Help needed climbing 3-5 steps with a railing? : Total 6 Click Score: 8    End of Session   Activity Tolerance: Patient tolerated treatment well;Patient limited by pain Patient left: in bed;with call bell/phone within reach Nurse Communication: Mobility status PT Visit Diagnosis: Pain;Other symptoms and signs involving the nervous system (R29.898);Other abnormalities of gait and mobility (R26.89);Difficulty in walking, not elsewhere classified (R26.2);Muscle weakness (generalized) (M62.81) Pain - Right/Left: Right Pain - part of body: Leg (back)     Time: 6734-1937 PT Time Calculation (min) (ACUTE ONLY): 17 min  Charges:  $Therapeutic Exercise: 8-22 mins                    G Codes:       Governor Rooks, PTA pager [phone]    Cristela Blue 06/14/2017, 11:12 AM

## 2017-06-14 NOTE — Progress Notes (Signed)
ANTICOAGULATION CONSULT NOTE - Initial Consult  Pharmacy Consult for Coumadin Indication: history of PE  Allergies  Allergen Reactions  . Latex Other (See Comments)    Stings and burns me  . Other Nausea And Vomiting and Other (See Comments)    Bleach - burns esophagus, breaks me out and burns me  . Shellfish Allergy Hives and Nausea And Vomiting  . Tape Other (See Comments)    Plastic tape irritates skin   Patient Measurements: Height: 5\' 3"  (160 cm) Weight: 282 lb (127.9 kg) IBW/kg (Calculated) : 52.4  Assessment: 61 yo F on Coumadin 2mg  daily exc 5mg  on MWF PTA for hx PE x3. Held 8/10 for possible surgery. Anticoag per neurosurgery. S/p transforaminal block at L5S1. neurosurgery - myelography and potential debridement and closure (currently having CSF leak). Now s/p OR for removal of harware and decompression on 8/17. Neuro ok with restarting Coumadin on 8/21. INR low at 1.03. Will continue low dose Lovenox as bridge. Hgb improved today.  Goal of Therapy:  INR 2-3 Monitor platelets by anticoagulation protocol: Yes   Plan:  Continue Lovenox 40mg  SQ q24 per Neurosurgery Give Coumadin 5mg  PO today Monitor daily INR, CBC, s/s of bleed  Elenor Quinones, PharmD, BCPS Clinical Pharmacist Pager [phone] 06/14/2017 10:04 AM

## 2017-06-14 NOTE — Progress Notes (Signed)
Patient placed herself on home CPAP for the night. Sp02=97% patient appears to be in no respiratory distress at this time.

## 2017-06-14 NOTE — Progress Notes (Signed)
Patient ID: [name], female   DOB: 04-14-1956, 61 y.o.   MRN: [id number] Vital signs are stable Motor function appears about the same Clinically the patient complains of pain in her back and right leg still She is off the steroid medication at this time also I believe she can be transferred to rehabilitation as required for further postoperative care At this time she can also undergo anticoagulation if necessary.

## 2017-06-14 NOTE — Progress Notes (Signed)
PROGRESS NOTE  [name]:[id number] DOB: [birth year] DOA: 06/02/2017   PCP: Hannah Sheller, MD  Brief Narrative: 61 year old woman PMH COPD, coronary artery and a disease, pulmonary embolism on warfarin, chronic back pain presented with worsening back pain, right leg sciatica and intermittent paresthesia. Admitted for intractable pain, neurosurgery evaluation.  Assessment/Plan  Intractable lower back pain - with associated sciatica and intermittent paresthesias, secondary to spinal stenosis pain.  - Status post transforaminal block with minimal response 8/13 - worse area of stenosis is at L5-S1 causing severe compromise of the right sided Sq nerve root - pt is no post op day #5, status post L5-S1 interbody fusion with removal of hardware L4-5 -patient stable overall, but continue complaining of lower back pain and RLE pain and decrease mobility.  - PT/OT eval done, pt declining SNF, will go home with home health PT, OT, RN - wound site is clean and dry - has completed 24 hours of steroids, without much improvement -will follow up further neurosurgery rec's  Leukocytosis - likely reactive and then due to use of steroids -no signs of infection seen -will follow trend -continue holding abx's  Post op blood loss anemia -no signs of acute bleeding -Hgb stable and rising -will monitor  OSA -continue CPAP  PMH pulmonary embolism, DVT -patient back on coumadin per pharmacy  PMH chronic UTI, asymptomatic bacteriuria - Patient asymptomatic. Continue trimethoprim.  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 -resp status stable -no wheezing and with good O2 sat on RA -continue current resp therapy  Essential hypertension -stable overall -will continue current antihypertensive therapy.  Morbid obesity class III. - Body mass index is 49.95 kg/m.  -low calorie diet discussed with patient.  DVT prophylaxis: SCD's Code Status:  Full  Family Communication: pt at bedside  Disposition Plan: home when pain controlled and neurosurgery team clears her for discharge.  Consultants:  Neurosurgery  IR  Procedures:  8/12 Right S1 nerve root epidural steroid injection performed for Right S1 radiculopathy secondary to stenosis at L5/S1  8/17 Removal of Steffee hardware L4-L5, decompression of L5-S1 via laminectomy and total discectomy with posterior lumbar interbody arthrodesis using peek spacer local autograft and allograft pedicle screw fixation L5-S1 posterior lateral arthrodesis with local autograft and allograft L5-S1  Antimicrobials:  Trimethoprim (home regimen, prophylaxis)  Subjective: Patient still complaining of RLE pain and low back discomfort. Reporting decrease range of motion.   Objective: Vitals:   06/13/17 1300 06/13/17 2016 06/14/17 0427 06/14/17 1428  BP: (!) 119/54 (!) 108/50 (!) 118/49 (!) 120/54  Pulse:  95 93 94  Resp: 18 18 18 18   Temp: 98.8 F (37.1 C) 98.1 F (36.7 C) 98.5 F (36.9 C) 97.9 F (36.6 C)  TempSrc: Oral Oral Oral Oral  SpO2: 97% 96% 93% 97%  Weight:      Height:        Intake/Output Summary (Last 24 hours) at 06/14/17 2108 Last data filed at 06/14/17 1900  Gross per 24 hour  Intake              720 ml  Output             2800 ml  Net            -2080 ml   Filed Weights   06/05/17 0500 06/07/17 0500 06/11/17 0500  Weight: 127.5 kg (281 lb 1.6 oz) 126 kg (277 lb 12.8 oz) 127.9 kg (282 lb)   Physical Exam  Constitutional:  afebrile, denying CP and SOB; continue complaining of Right leg pain and intermittent low back pain. CVS: RRR, S1 and S2, no rubs, no gallops Pulmonary: good air movement, no wheezing, no crackles Abdominal: soft, NT, Obese, ND, positive BS  Musculoskeletal: no edema, reported pain in her RLE and decrease in range of motion; no complaints or issues on her left side.    Data Reviewed: I have personally reviewed following labs and imaging  studies  CBC:  Recent Labs Lab 06/10/17 0228 06/11/17 0444 06/12/17 0424 06/13/17 0303 06/14/17 0253  WBC 19.8* 12.6* 13.7* 13.7* 15.9*  HGB 11.3* 9.4* 10.4* 9.6* 10.6*  HCT 36.6 30.5* 33.0* 31.9* 34.2*  MCV 85.1 86.6 86.4 85.3 83.6  PLT 316 217 282 279 701   Basic Metabolic Panel:  Recent Labs Lab 06/10/17 0228 06/11/17 0444 06/12/17 0424 06/13/17 0303 06/14/17 0253  NA 137 138 138 139 137  K 4.0 3.6 4.2 3.8 3.8  CL 101 104 103 103 102  CO2 26 27 29 30 25   GLUCOSE 143* 131* 127* 121* 191*  BUN 9 11 8 8 8   CREATININE 0.85 0.67 0.64 0.73 0.68  CALCIUM 8.5* 7.9* 8.5* 8.6* 9.2   Urine analysis:    Component Value Date/Time   COLORURINE YELLOW 06/02/2017 1440   APPEARANCEUR HAZY (A) 06/02/2017 1440   LABSPEC 1.024 06/02/2017 1440   PHURINE 5.0 06/02/2017 1440   GLUCOSEU NEGATIVE 06/02/2017 1440   HGBUR NEGATIVE 06/02/2017 1440   BILIRUBINUR NEGATIVE 06/02/2017 1440   KETONESUR NEGATIVE 06/02/2017 1440   PROTEINUR NEGATIVE 06/02/2017 1440   UROBILINOGEN 1.0 04/15/2013 2331   NITRITE NEGATIVE 06/02/2017 1440   LEUKOCYTESUR NEGATIVE 06/02/2017 1440    Recent Results (from the past 240 hour(s))  Surgical pcr screen     Status: None   Collection Time: 06/09/17  1:23 PM  Result Value Ref Range Status   MRSA, PCR NEGATIVE NEGATIVE Final   Staphylococcus aureus NEGATIVE NEGATIVE Final    Comment:        The Xpert SA Assay (FDA approved for NASAL specimens in patients over 77 years of age), is one component of a comprehensive surveillance program.  Test performance has been validated by [facility] for patients greater than or equal to 28 year old. It is not intended to diagnose infection nor to guide or monitor treatment.     Radiology Studies: No results found.  Scheduled Meds: . aspirin EC  81 mg Oral Q1200  . docusate sodium  100 mg Oral BID  . enoxaparin (LOVENOX) injection  40 mg Subcutaneous Q24H  . estradiol  1 Applicatorful Vaginal QHS  .  iopamidol  20 mL Intra-articular Once  . metoprolol succinate  50 mg Oral Daily  . pantoprazole  40 mg Oral Daily  . pravastatin  40 mg Oral Q1200  . senna  1 tablet Oral BID  . sodium chloride flush  3 mL Intravenous Q12H  . trimethoprim  100 mg Oral Daily  . Vitamin D (Ergocalciferol)  50,000 Units Oral Q7 days  . Warfarin - Pharmacist Dosing Inpatient   Does not apply q1800   Continuous Infusions: . sodium chloride    . lactated ringers 10 mL/hr at 06/09/17 1134  . methocarbamol (ROBAXIN)  IV      LOS: 9 days   Time spent: 25 minutes  Barton Dubois, MD [facility] Pager [phone]  If 7PM-7AM, please contact night-coverage [email or web address] Password [email or web address] 06/14/2017, 9:08 PM

## 2017-06-15 ENCOUNTER — Inpatient Hospital Stay (HOSPITAL_COMMUNITY): Payer: Medicare Other

## 2017-06-15 DIAGNOSIS — M5417 Radiculopathy, lumbosacral region: Secondary | ICD-10-CM

## 2017-06-15 LAB — PROTIME-INR
INR: 1.27
Prothrombin Time: 16 seconds — ABNORMAL HIGH (ref 11.4–15.2)

## 2017-06-15 MED ORDER — HYDROMORPHONE HCL 1 MG/ML IJ SOLN: 1.0000 mg | INTRAMUSCULAR | Status: DC | PRN

## 2017-06-15 MED ORDER — OXYCODONE HCL 5 MG PO TABS
10.0000 mg | ORAL_TABLET | ORAL | Status: DC | PRN
  Administered 2017-06-15 – 2017-06-16 (×4): 10 mg via ORAL
  Filled 2017-06-15 (×4): qty 2

## 2017-06-15 MED ORDER — VALPROATE SODIUM 500 MG/5ML IV SOLN
500.0000 mg | Freq: Once | INTRAVENOUS | Status: AC
  Administered 2017-06-15: 500 mg via INTRAVENOUS
  Filled 2017-06-15: qty 5

## 2017-06-15 MED ORDER — WARFARIN SODIUM 5 MG PO TABS
5.0000 mg | ORAL_TABLET | Freq: Once | ORAL | Status: AC
  Administered 2017-06-15: 5 mg via ORAL
  Filled 2017-06-15: qty 1

## 2017-06-15 NOTE — Progress Notes (Signed)
Patient ID: [name], female   DOB: 1956-07-25, 61 y.o.   MRN: [id number] Still c/o pain on right side. Ok to restart medical prophylaxis for PE I will order CT lumbar

## 2017-06-15 NOTE — Progress Notes (Signed)
PT Cancellation Note  Patient Details Name: ADIA CRAMMER MRN: [id number] DOB: 03-14-1956   Cancelled Treatment:    Reason Eval/Treat Not Completed: Medical issues which prohibited therapy. Pt with continued R side pain. Plan for CT of lumbar per neurosurgery's progress note. Will f/u with pt as available and appropriate following results of [place] 06/15/2017, 11:46 AM

## 2017-06-15 NOTE — Progress Notes (Signed)
PROGRESS NOTE  [name] WNI:[id number] DOB: 06-20-1956 DOA: 06/02/2017   PCP: Thressa Sheller, MD  Brief Narrative: 61 year old woman PMH COPD, coronary artery and a disease, pulmonary embolism on warfarin, chronic back pain presented with worsening back pain, right leg sciatica and intermittent paresthesia. Admitted for intractable pain, neurosurgery evaluation.  Assessment/Plan  Intractable lower back pain - with associated sciatica and intermittent paresthesias, secondary to spinal stenosis pain.  - Status post transforaminal block with minimal response 8/13 - worse area of stenosis is at L5-S1 causing severe compromise of the right sided Sq nerve root - pt is no post op day #6, status post L5-S1 interbody fusion with removal of hardware L4-5 -patient stable overall, but continue complaining of lower back pain and RLE pain and decrease mobility.  -PT/OT eval done, pt declining SNF, will go home with home health PT, OT, RN -wound site is clean and dry -completed 24 hours of steroids, without much improvement -per neurosurgery will check CT of her lumbar spine -if remains stable, will discharge home tomorrow. -will adjust pain medication   Leukocytosis -likely reactive and then due to use of steroids -no signs of infection appreciated -remains afebrile  -will follow WBC trend -continue holding abx's  Post op blood loss anemia -no signs of acute bleeding -Hgb stable and rising -will monitor Hgb trend   OSA -continue CPAP QHS  PMH pulmonary embolism, DVT -patient back on coumadin per pharmacy -using also lovenox bridge -INR 1.27  PMH chronic UTI, asymptomatic bacteriuria - Patient denies dysuria.  -will continue trimethoprim.  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 -resp status stable -no wheezing and with good O2 sat on RA -continue current resp therapy  Essential hypertension -BP is stable overall -will continue  current antihypertensive therapy.  Morbid obesity class III. - Body mass index is 49.95 kg/m.  -low calorie diet discussed with patient.  Migraine -will treat with IV depakote   DVT prophylaxis: SCD's Code Status: Full  Family Communication: pt at bedside  Disposition Plan: home when pain better controlled and neurosurgery team clears her for discharge.  Consultants:  Neurosurgery  IR  Procedures:  8/12 Right S1 nerve root epidural steroid injection performed for Right S1 radiculopathy secondary to stenosis at L5/S1  8/17 Removal of Steffee hardware L4-L5, decompression of L5-S1 via laminectomy and total discectomy with posterior lumbar interbody arthrodesis using peek spacer local autograft and allograft pedicle screw fixation L5-S1 posterior lateral arthrodesis with local autograft and allograft L5-S1  Antimicrobials:  Trimethoprim (home regimen, prophylaxis)  Subjective: Afebrile, no CP, no SOB. Reporting severe migraine. Continue also having RLE pain and low back discomfort.    Objective: Vitals:   06/14/17 1428 06/14/17 2153 06/15/17 0619 06/15/17 1502  BP: (!) 120/54 (!) 94/45 (!) 112/49 (!) 142/48  Pulse: 94 83 84 83  Resp: 18 16 16 16   Temp: 97.9 F (36.6 C) 98.6 F (37 C) 97.9 F (36.6 C) 97.7 F (36.5 C)  TempSrc: Oral Oral Oral Axillary  SpO2: 97% 96% 98% 96%  Weight:      Height:        Intake/Output Summary (Last 24 hours) at 06/15/17 1737 Last data filed at 06/15/17 1502  Gross per 24 hour  Intake              720 ml  Output             2500 ml  Net            -  1780 ml   Filed Weights   06/05/17 0500 06/07/17 0500 06/11/17 0500  Weight: 127.5 kg (281 lb 1.6 oz) 126 kg (277 lb 12.8 oz) 127.9 kg (282 lb)   Physical Exam  Constitutional: afebrile, denies CP and SOB> complaining of severe migraine and also continue expressing pain in her lower back and RLE.  CVS: RRR, S1 and S2, no rubs, no gallops Pulmonary: good air movement, no crackles,  no wheezing, normal resp effort Abdominal: soft, NT, ND, positive BS  Musculoskeletal: no edema or cyanosis, pain and decrease range of motion and capacity to bear weight on her RLE. LLE w/o acute abnormalities.    Data Reviewed: I have personally reviewed following labs and imaging studies  CBC:  Recent Labs Lab 06/10/17 0228 06/11/17 0444 06/12/17 0424 06/13/17 0303 06/14/17 0253  WBC 19.8* 12.6* 13.7* 13.7* 15.9*  HGB 11.3* 9.4* 10.4* 9.6* 10.6*  HCT 36.6 30.5* 33.0* 31.9* 34.2*  MCV 85.1 86.6 86.4 85.3 83.6  PLT 316 217 282 279 397   Basic Metabolic Panel:  Recent Labs Lab 06/10/17 0228 06/11/17 0444 06/12/17 0424 06/13/17 0303 06/14/17 0253  NA 137 138 138 139 137  K 4.0 3.6 4.2 3.8 3.8  CL 101 104 103 103 102  CO2 26 27 29 30 25   GLUCOSE 143* 131* 127* 121* 191*  BUN 9 11 8 8 8   CREATININE 0.85 0.67 0.64 0.73 0.68  CALCIUM 8.5* 7.9* 8.5* 8.6* 9.2   Urine analysis:    Component Value Date/Time   COLORURINE YELLOW 06/02/2017 1440   APPEARANCEUR HAZY (A) 06/02/2017 1440   LABSPEC 1.024 06/02/2017 1440   PHURINE 5.0 06/02/2017 1440   GLUCOSEU NEGATIVE 06/02/2017 1440   HGBUR NEGATIVE 06/02/2017 1440   BILIRUBINUR NEGATIVE 06/02/2017 1440   KETONESUR NEGATIVE 06/02/2017 1440   PROTEINUR NEGATIVE 06/02/2017 1440   UROBILINOGEN 1.0 04/15/2013 2331   NITRITE NEGATIVE 06/02/2017 1440   LEUKOCYTESUR NEGATIVE 06/02/2017 1440    Recent Results (from the past 240 hour(s))  Surgical pcr screen     Status: None   Collection Time: 06/09/17  1:23 PM  Result Value Ref Range Status   MRSA, PCR NEGATIVE NEGATIVE Final   Staphylococcus aureus NEGATIVE NEGATIVE Final    Comment:        The Xpert SA Assay (FDA approved for NASAL specimens in patients over 22 years of age), is one component of a comprehensive surveillance program.  Test performance has been validated by [facility] for patients greater than or equal to 8 year old. It is not intended to diagnose  infection nor to guide or monitor treatment.     Radiology Studies: Ct Lumbar Spine Wo Contrast  Result Date: 06/15/2017 CLINICAL DATA:  Radiculopathy, low back pain extending into the right leg EXAM: CT LUMBAR SPINE WITHOUT CONTRAST TECHNIQUE: Multidetector CT imaging of the lumbar spine was performed without intravenous contrast administration. Multiplanar CT image reconstructions were also generated. COMPARISON:  06/07/2017 FINDINGS: Segmentation: 5 lumbar type vertebrae. Alignment: Minimal anterolisthesis of L3 on L4. Minimal retrolisthesis of L2 on L3. Vertebrae: No acute fracture or focal pathologic process. Paraspinal and other soft tissues: No paraspinal abnormality. Disc levels: T12-L1: Anterior bridging osteophyte. No evidence of neural foraminal stenosis. No central canal stenosis. L1-L2: Anterior bridging osteophyte. Mild broad-based disc bulge. Mild bilateral facet arthropathy. No evidence of neural foraminal stenosis. No central canal stenosis. L2-L3: Broad-based disc bulge flattening the ventral thecal sac. Moderate bilateral facet arthropathy. Bilateral lateral recess stenosis. Mild bilateral foraminal stenosis.  Moderate spinal stenosis. L3-L4: Broad-based disc bulge. Moderate bilateral facet arthropathy. Prior left laminectomy. Moderate spinal stenosis. No foraminal stenosis. L4-L5: Discectomy with solid arthrodesis across the L4-5 disc space. No foraminal stenosis. L5-S1: Interval posterior lumbar interbody fusion at L5-S1. No hardware failure or complication. Fluid along the posterior paraspinal soft tissues likely reflecting postoperative seroma or liquified hematoma. No evidence of neural foraminal stenosis. IMPRESSION: 1. At L5-S1 there is interval posterior lumbar interbody fusion at L5-S1. No hardware failure or complication. Fluid along the posterior paraspinal soft tissues likely reflecting postoperative seroma or liquified hematoma. 2. At L4-5 there is discectomy with solid  arthrodesis across the L4-5 disc space. 3. At L3-4 there is a broad-based Moderate bilateral facet arthropathy. Prior left laminectomy. Moderate spinal stenosis. 4. At L2-3 there is a broad-based disc bulge flattening the ventral thecal sac. Moderate bilateral facet arthropathy. Bilateral lateral recess stenosis. Mild bilateral foraminal stenosis. Moderate spinal stenosis. Electronically Signed   By: Kathreen Devoid   On: 06/15/2017 12:57    Scheduled Meds: . aspirin EC  81 mg Oral Q1200  . docusate sodium  100 mg Oral BID  . enoxaparin (LOVENOX) injection  40 mg Subcutaneous Q24H  . estradiol  1 Applicatorful Vaginal QHS  . iopamidol  20 mL Intra-articular Once  . metoprolol succinate  50 mg Oral Daily  . pantoprazole  40 mg Oral Daily  . pravastatin  40 mg Oral Q1200  . senna  1 tablet Oral BID  . sodium chloride flush  3 mL Intravenous Q12H  . trimethoprim  100 mg Oral Daily  . Vitamin D (Ergocalciferol)  50,000 Units Oral Q7 days  . Warfarin - Pharmacist Dosing Inpatient   Does not apply q1800   Continuous Infusions: . sodium chloride    . lactated ringers 10 mL/hr at 06/09/17 1134  . methocarbamol (ROBAXIN)  IV      LOS: 10 days   Time spent: 25 minutes  Barton Dubois, MD [facility] Pager [phone]  If 7PM-7AM, please contact night-coverage [email or web address] Password [email or web address] 06/15/2017, 5:37 PM

## 2017-06-15 NOTE — Progress Notes (Signed)
ANTICOAGULATION CONSULT NOTE - Initial Consult  Pharmacy Consult for Coumadin Indication: history of PE  Allergies  Allergen Reactions  . Latex Other (See Comments)    Stings and burns me  . Other Nausea And Vomiting and Other (See Comments)    Bleach - burns esophagus, breaks me out and burns me  . Shellfish Allergy Hives and Nausea And Vomiting  . Tape Other (See Comments)    Plastic tape irritates skin   Patient Measurements: Height: 5\' 3"  (160 cm) Weight: 282 lb (127.9 kg) IBW/kg (Calculated) : 52.4  Assessment: 61 yo F on Coumadin 2mg  daily exc 5mg  on MWF PTA for hx PE x3. Held 8/10 for possible surgery. Anticoag per neurosurgery. S/p transforaminal block at L5S1. neurosurgery - myelography and potential debridement and closure (currently having CSF leak). Now s/p OR for removal of harware and decompression on 8/17. Neuro ok with restarting Coumadin on 8/21. INR 1.27. Will continue low dose Lovenox as bridge. Hgb improved today.  Goal of Therapy:  INR 2-3 Monitor platelets by anticoagulation protocol: Yes   Plan:  Continue Lovenox 40mg  SQ q24 per Neurosurgery Give Coumadin 5mg  PO today Monitor daily INR, CBC, s/s of bleed  [name], PharmD, BCPS  Clinical Pharmacist  Pager: [phone]   06/15/2017 11:12 AM

## 2017-06-15 NOTE — Care Management (Signed)
Patient needs Hospital bed s/p Lumbar decompression . Patient has continued pain when flat and not positioned correctly. Bed requested through [facility].

## 2017-06-16 DIAGNOSIS — M5416 Radiculopathy, lumbar region: Secondary | ICD-10-CM

## 2017-06-16 DIAGNOSIS — I10 Essential (primary) hypertension: Secondary | ICD-10-CM

## 2017-06-16 LAB — CBC
HCT: 33 % — ABNORMAL LOW (ref 36.0–46.0)
Hemoglobin: 9.9 g/dL — ABNORMAL LOW (ref 12.0–15.0)
MCH: 25.6 pg — ABNORMAL LOW (ref 26.0–34.0)
MCHC: 30 g/dL (ref 30.0–36.0)
MCV: 85.5 fL (ref 78.0–100.0)
PLATELETS: 419 10*3/uL — AB (ref 150–400)
RBC: 3.86 MIL/uL — ABNORMAL LOW (ref 3.87–5.11)
RDW: 17 % — AB (ref 11.5–15.5)
WBC: 17.5 10*3/uL — AB (ref 4.0–10.5)

## 2017-06-16 LAB — MAGNESIUM: MAGNESIUM: 2.1 mg/dL (ref 1.7–2.4)

## 2017-06-16 LAB — BASIC METABOLIC PANEL
Anion gap: 12 (ref 5–15)
BUN: 14 mg/dL (ref 6–20)
CALCIUM: 8.8 mg/dL — AB (ref 8.9–10.3)
CO2: 26 mmol/L (ref 22–32)
CREATININE: 0.79 mg/dL (ref 0.44–1.00)
Chloride: 100 mmol/L — ABNORMAL LOW (ref 101–111)
Glucose, Bld: 155 mg/dL — ABNORMAL HIGH (ref 65–99)
Potassium: 3.8 mmol/L (ref 3.5–5.1)
SODIUM: 138 mmol/L (ref 135–145)

## 2017-06-16 LAB — PROTIME-INR
INR: 1.86
PROTHROMBIN TIME: 21.7 s — AB (ref 11.4–15.2)

## 2017-06-16 MED ORDER — METHOCARBAMOL 500 MG PO TABS: 500.0000 mg | ORAL_TABLET | Freq: Four times a day (QID) | ORAL | 0 refills | Status: DC | PRN

## 2017-06-16 MED ORDER — DOCUSATE SODIUM 100 MG PO CAPS: 100.0000 mg | ORAL_CAPSULE | Freq: Two times a day (BID) | ORAL | 0 refills | Status: DC

## 2017-06-16 MED ORDER — WARFARIN SODIUM 5 MG PO TABS: 5.0000 mg | ORAL_TABLET | Freq: Once | ORAL | Status: DC

## 2017-06-16 MED ORDER — OXYCODONE HCL 10 MG PO TABS: 10.0000 mg | ORAL_TABLET | Freq: Four times a day (QID) | ORAL | 0 refills | Status: DC | PRN

## 2017-06-16 NOTE — Therapy (Signed)
Occupational Therapy Treatment Patient Details Name: [name] MRN: [id number] DOB: Sep 08, 1956 Today's Date: 06/16/2017    History of present illness [name] is a 61 y.o. female with medical history significant for asthma, COPD, hypertension, CAD status post MI, history of PE on Coumadin, chronic back pain, admitted with right low back pain and L4-S1 stenosis s/p steroid injection S1 06/04/17. Pt now s/p L5-S1 PLIF with removal of hardware L4-5 on 06/09/17. now s/p  L5-S1 decompression and fusion   OT comments  Pt required min guard for bed mobility with verbal cues for technique and close min guard for sit/stand transfer. Pt required min assist to min guard with functional mobility using a RW. Pt educated on use of AE (reacher, tongs, sock aid) for LB ADLs. OT will continue to follow acutely until medically stable to d/c home.    Follow Up Recommendations  Home health OT;Supervision/Assistance - 24 hour;DC plan and follow up therapy as arranged by surgeon    Equipment Recommendations  3 in 1 bedside commode    Recommendations for Other Services      Precautions / Restrictions Precautions Precautions: Fall;Back Precaution Comments: Pt able to recall 3/3 back precautions. Required Braces or Orthoses: Spinal Brace Spinal Brace: Lumbar corset Restrictions Weight Bearing Restrictions: No       Mobility Bed Mobility Overal bed mobility: Needs Assistance Bed Mobility: Rolling;Sidelying to Sit Rolling: Min guard Sidelying to sit: Min guard       General bed mobility comments: Min guard for safety. Verbal cues for log rolling technique.  Transfers Overall transfer level: Needs assistance Equipment used: Rolling walker (2 wheeled) Transfers: Sit to/from Stand Sit to Stand: Min guard         General transfer comment: Pt able to boost from seated position with close min guard for safety.     Balance Overall balance assessment: Needs  assistance Sitting-balance support: No upper extremity supported;Feet unsupported Sitting balance-Leahy Scale: Good Sitting balance - Comments: Pt able to sit EOB for 5 mins for instruction on AE with no LOB.   Standing balance support: Bilateral upper extremity supported;During functional activity Standing balance-Leahy Scale: Fair Standing balance comment: Use of RW                            ADL either performed or assessed with clinical judgement   ADL Overall ADL's : Needs assistance/impaired                                     Functional mobility during ADLs: Min guard;Rolling walker General ADL Comments: Pt educated on use of reacher, long handled sponge, and tongs to use for LB ADLs and toilet hygiene.      Vision       Perception     Praxis      Cognition Arousal/Alertness: Awake/alert Behavior During Therapy: WFL for tasks assessed/performed Overall Cognitive Status: Within Functional Limits for tasks assessed                                          Exercises     Shoulder Instructions       General Comments Pt states her primary concern is toilet hygiene and need for AE to increase independence.  Pertinent Vitals/ Pain       Pain Assessment: No/denies pain Pain Score: 5  Pain Location: back and RLE  Pain Descriptors / Indicators: Aching;Sore;Sharp Pain Intervention(s): Limited activity within patient's tolerance;Monitored during session  Home Living                                          Prior Functioning/Environment              Frequency  Min 2X/week        Progress Toward Goals  OT Goals(current goals can now be found in the care plan section)  Progress towards OT goals: Progressing toward goals  Acute Rehab OT Goals Patient Stated Goal: To be more independent with toileting. OT Goal Formulation: With patient Time For Goal Achievement: 06/24/17 Potential to  Achieve Goals: Good  Plan Discharge plan remains appropriate    Co-evaluation                 AM-PAC PT "6 Clicks" Daily Activity     Outcome Measure   Help from another person eating meals?: A Little Help from another person taking care of personal grooming?: A Little Help from another person toileting, which includes using toliet, bedpan, or urinal?: A Lot Help from another person bathing (including washing, rinsing, drying)?: A Lot Help from another person to put on and taking off regular upper body clothing?: A Lot Help from another person to put on and taking off regular lower body clothing?: A Lot 6 Click Score: 14    End of Session Equipment Utilized During Treatment: Gait belt;Rolling walker;Back brace  OT Visit Diagnosis: Muscle weakness (generalized) (M62.81);Pain;Other abnormalities of gait and mobility (R26.89) Pain - Right/Left: Right Pain - part of body: Leg   Activity Tolerance Patient tolerated treatment well   Patient Left in chair;with call bell/phone within reach;with chair alarm set   Nurse Communication Mobility status        Time: 4696-2952 OT Time Calculation (min): 26 min  Charges: OT General Charges $OT Visit: 1 Procedure OT Treatments $Self Care/Home Management : 23-37 mins  Boykin Peek, OTS #[phone]    Boykin Peek 06/16/2017, 2:07 PM

## 2017-06-16 NOTE — Discharge Summary (Signed)
Physician Discharge Summary  [name]:[id number] DOB: 09/21/1956 DOA: 06/02/2017  PCP: Thressa Sheller, MD  Admit date: 06/02/2017 Discharge date: 06/16/2017  Time spent: 35 minutes  Recommendations for Outpatient Follow-up:  1. Repeat CBC to follow Hgb trend  2. Repeat BMET to follow electrolytes and renal function  3. Close follow up of patient INR and adjust coumadin dose if needed  4. Referral to pain clinic.   Discharge Diagnoses:  Active Problems:   Asthma   GERD (gastroesophageal reflux disease)   Hyperlipidemia   Chronic pulmonary embolism (HCC)   Right leg pain   Severe low back pain   Acute right-sided low back pain with sciatica   Palliative care by specialist   Lumbar back pain with radiculopathy affecting right lower extremity   Lumbar radiculopathy, chronic   Essential hypertension   Discharge Condition: stable and improved. Discharge home with [facility] services for PT/OT. Patient will follow up in 2 weeks with neurosurgery and in 10 days with PCP.  Diet recommendation: heart healthy/low calorie diet   Filed Weights   06/05/17 0500 06/07/17 0500 06/11/17 0500  Weight: 127.5 kg (281 lb 1.6 oz) 126 kg (277 lb 12.8 oz) 127.9 kg (282 lb)    History of present illness:  -61 y.o. female with medical history Asthma, COPD, hypertension, CAD status post MI, history of PE on Coumadin, chronic back pain; who presented to ED for further evaluation of back pain and to have neurosurgery evaluation.   Hospital Course:  Intractable lower back pain - with associated sciatica and intermittent paresthesias, secondary to spinal stenosis pain.  - Status post transforaminal block with minimal response 8/13 - worse area of stenosis is at L5-S1 causing severe compromise of the right sided Sq nerve root - pt is no post op day #6, status post L5-S1 interbody fusion with removal of hardware L4-5 -patient stable overall, but continue complaining of lower back pain and RLE pain  and decrease mobility.  -PT/OT eval done, pt declining SNF, will go home with home health PT/OT services to assist with rehab and movement.  -wound site is clean and dry; no surrounding erythema -completed 24 hours of steroids, without much improvement -per neurosurgery and after reviewing repeat CT, ok to discharge and to follow up with them as an outpatient.  -would benefit of pain clinic referral   Leukocytosis -likely reactive and then due to use of steroids -no signs of infection appreciated -remains afebrile  -will recommend CBC at follow up to reassess WBC trend  op blood loss anemia -no signs of acute bleeding -Hgb stable and rising -will recommend CBC as an outpatient to monitor Hgb trend   OSA -continue CPAP QHS  PMH pulmonary embolism, DVT -patient back on coumadin -INR at discharge 1.86, last dose of lovenox given on 8/24 -patient will resume home dose of coumadin starting today with 5mg ; INR goal 2-3  PMH chronic UTI, asymptomatic bacteriuria - Patient denies dysuria.  -will continue trimethoprim.  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 -resp status stable -no wheezing and with good O2 sat on RA -continue current resp therapy  Essential hypertension -BP is stable overall -will continue current antihypertensive therapy.  Morbid obesity class III. - Body mass index is 49.95 kg/m.  -low calorie diet discussed with patient.  Migraine -treated with IV depakote  -no migraine at discharge   Procedures:  8/12 Right S1 nerve root epidural steroid injection performed for Right S1 radiculopathy secondary to stenosis  at L5/S1  8/17 Removal of Steffee hardware L4-L5, decompression of L5-S1 via laminectomy and total discectomy with posterior lumbar interbody arthrodesis using peek spacer local autograft and allograft pedicle screw fixation L5-S1 posterior lateral arthrodesis with local autograft and allograft  L5-S1  Consultations:  Neurosurgery   IR  Discharge Exam: Vitals:   06/15/17 2016 06/16/17 0423  BP: (!) 110/56 (!) 109/59  Pulse: 79 77  Resp: 16 15  Temp: 98.9 F (37.2 C) 97.8 F (36.6 C)  SpO2: 96% 97%   Constitutional: afebrile, denies CP and SOB; reports migraine resolved and had a good rest overnight. still with intermittent pain in her lower back and RLE.    CVS: RRR, S1 and S2, no rubs, no gallops Pulmonary: good air movement, no crackles, no wheezing, normal resp effort Abdominal: soft, NT, ND, positive BS  Musculoskeletal: no edema or cyanosis, pain and decrease range of motion and capacity to bear weight on her RLE. LLE w/o acute abnormalities.     Discharge Instructions   Discharge Instructions    Discharge instructions    Complete by:  As directed    Follow up with Dr. Ellene Route in 2 weeks Take medications as prescribed Keep yourself well hydrated Follow heart healthy and low calorie diet  Arrange follow up with PCP in 10 days.   Incentive spirometry RT    Complete by:  As directed      Current Discharge Medication List    START taking these medications   Details  docusate sodium (COLACE) 100 MG capsule Take 1 capsule (100 mg total) by mouth 2 (two) times daily. Qty: 10 capsule, Refills: 0    methocarbamol (ROBAXIN) 500 MG tablet Take 1 tablet (500 mg total) by mouth every 6 (six) hours as needed for muscle spasms. Qty: 40 tablet, Refills: 0      CONTINUE these medications which have CHANGED   Details  Oxycodone HCl 10 MG TABS Take 1 tablet (10 mg total) by mouth every 6 (six) hours as needed (for severe pain). Qty: 30 tablet, Refills: 0      CONTINUE these medications which have NOT CHANGED   Details  albuterol (PROVENTIL HFA;VENTOLIN HFA) 108 (90 BASE) MCG/ACT inhaler Inhale 2 puffs into the lungs every 6 (six) hours as needed. For wheezing Qty: 2 each, Refills: 0    aspirin EC 81 MG tablet Take 81 mg by mouth daily at 12 noon.      diazepam (VALIUM) 10 MG tablet Take 10 mg by mouth every 8 (eight) hours as needed for anxiety.     diclofenac sodium (VOLTAREN) 1 % GEL Apply 2 g topically 4 (four) times daily as needed (For pain in shoulder, knees, back and neck.).     estradiol (ESTRACE) 0.1 MG/GM vaginal cream Place 1 Applicatorful vaginally at bedtime.    furosemide (LASIX) 40 MG tablet Take 40 mg by mouth 2 (two) times daily as needed for fluid. For fluid retention.    lidocaine (LIDODERM) 5 % Place 1-3 patches onto the skin daily. Remove & Discard patch within 12 hours or as directed by MD    losartan (COZAAR) 50 MG tablet Take 50 mg by mouth daily at 12 noon.     metoprolol succinate (TOPROL-XL) 50 MG 24 hr tablet Take 50 mg by mouth daily. Take with or immediately following a meal.     nitroGLYCERIN (NITROSTAT) 0.4 MG SL tablet Place 0.4 mg under the tongue every 5 (five) minutes as needed for chest pain.  nystatin [facility]) powder Apply 1 g topically daily as needed (rash).    omeprazole (PRILOSEC) 40 MG capsule Take 40 mg by mouth 2 (two) times daily.     pravastatin (PRAVACHOL) 40 MG tablet Take 40 mg by mouth daily at 12 noon.     trimethoprim (TRIMPEX) 100 MG tablet Take 100 mg by mouth daily.    Vitamin D, Ergocalciferol, (DRISDOL) 50000 units CAPS capsule Take 50,000 Units by mouth every 7 (seven) days.    warfarin (COUMADIN) 5 MG tablet Take 2.5-5 mg by mouth See admin instructions. Take one tablet (5mg ) on Monday, Wednesday, Friday and half a tablet (2.5mg ) the rest of the week. Refills: 0      STOP taking these medications     cyclobenzaprine (FLEXERIL) 10 MG tablet      cephALEXin (KEFLEX) 500 MG capsule      HYDROcodone-acetaminophen (NORCO/VICODIN) 5-325 MG tablet      promethazine (PHENERGAN) 25 MG tablet        Allergies  Allergen Reactions  . Latex Other (See Comments)    Stings and burns me  . Other Nausea And Vomiting and Other (See Comments)    Bleach - burns esophagus,  breaks me out and burns me  . Shellfish Allergy Hives and Nausea And Vomiting  . Tape Other (See Comments)    Plastic tape irritates skin   Follow-up Information    Thressa Sheller, MD. Schedule an appointment as soon as possible for a visit in 10 day(s).   Specialty:  Internal Medicine Contact information: [place], [place] [address] [phone]        Kristeen Miss, MD. Schedule an appointment as soon as possible for a visit in 2 week(s).   Specialty:  Neurosurgery Contact information: [address] [place] [address] [phone]           The results of significant diagnostics from this hospitalization (including imaging, microbiology, ancillary and laboratory) are listed below for reference.    Significant Diagnostic Studies: Dg Chest 2 View  Result Date: 06/08/2017 CLINICAL DATA:  Shortness of breath.  Chronic chest pain. EXAM: CHEST  2 VIEW COMPARISON:  June 02, 2017 FINDINGS: No pneumothorax. Atelectasis or scar in the left mid lung. No pulmonary nodules or masses. No focal infiltrates. The cardiomediastinal silhouette is stable. IMPRESSION: Scar or atelectasis in the left mid lung. No other acute abnormalities. Electronically Signed   By: Dorise Bullion III M.D   On: 06/08/2017 21:18   Dg Chest 2 View  Result Date: 06/02/2017 CLINICAL DATA:  Asthma. EXAM: CHEST  2 VIEW COMPARISON:  03/14/2013 FINDINGS: The heart size and mediastinal contours are within normal limits. Low lung volumes with bibasilar atelectasis. There is no evidence of pulmonary edema, consolidation, pneumothorax, nodule or pleural fluid. The visualized skeletal structures are unremarkable. IMPRESSION: Bibasilar atelectasis.  No active cardiopulmonary disease. Electronically Signed   By: Aletta Edouard M.D.   On: 06/02/2017 20:12   Dg Lumbar Spine 2-3 Views  Result Date: 06/09/2017 CLINICAL DATA:  Elective surgery.  Lumbar fusion. EXAM: DG C-ARM  61-120 MIN; LUMBAR SPINE - 2-3 VIEW COMPARISON:  Preoperative lumbar myelogram 06/07/2017 FINDINGS: Frontal and lateral coned fluoroscopic spot views of the lumbar spine. Two level posterior fusion with intrapedicular screws, likely L5-S1 level, however levels not well-defined given body habitus. There is an interbody spacer. Please reference operative report for more details. Total fluoroscopy time reported 8 seconds. IMPRESSION: Procedural fluoroscopy for lumbar fusion. Electronically  Signed   By: Jeb Levering M.D.   On: 06/09/2017 16:10   Ct Lumbar Spine Wo Contrast  Result Date: 06/15/2017 CLINICAL DATA:  Radiculopathy, low back pain extending into the right leg EXAM: CT LUMBAR SPINE WITHOUT CONTRAST TECHNIQUE: Multidetector CT imaging of the lumbar spine was performed without intravenous contrast administration. Multiplanar CT image reconstructions were also generated. COMPARISON:  06/07/2017 FINDINGS: Segmentation: 5 lumbar type vertebrae. Alignment: Minimal anterolisthesis of L3 on L4. Minimal retrolisthesis of L2 on L3. Vertebrae: No acute fracture or focal pathologic process. Paraspinal and other soft tissues: No paraspinal abnormality. Disc levels: T12-L1: Anterior bridging osteophyte. No evidence of neural foraminal stenosis. No central canal stenosis. L1-L2: Anterior bridging osteophyte. Mild broad-based disc bulge. Mild bilateral facet arthropathy. No evidence of neural foraminal stenosis. No central canal stenosis. L2-L3: Broad-based disc bulge flattening the ventral thecal sac. Moderate bilateral facet arthropathy. Bilateral lateral recess stenosis. Mild bilateral foraminal stenosis. Moderate spinal stenosis. L3-L4: Broad-based disc bulge. Moderate bilateral facet arthropathy. Prior left laminectomy. Moderate spinal stenosis. No foraminal stenosis. L4-L5: Discectomy with solid arthrodesis across the L4-5 disc space. No foraminal stenosis. L5-S1: Interval posterior lumbar interbody fusion at  L5-S1. No hardware failure or complication. Fluid along the posterior paraspinal soft tissues likely reflecting postoperative seroma or liquified hematoma. No evidence of neural foraminal stenosis. IMPRESSION: 1. At L5-S1 there is interval posterior lumbar interbody fusion at L5-S1. No hardware failure or complication. Fluid along the posterior paraspinal soft tissues likely reflecting postoperative seroma or liquified hematoma. 2. At L4-5 there is discectomy with solid arthrodesis across the L4-5 disc space. 3. At L3-4 there is a broad-based Moderate bilateral facet arthropathy. Prior left laminectomy. Moderate spinal stenosis. 4. At L2-3 there is a broad-based disc bulge flattening the ventral thecal sac. Moderate bilateral facet arthropathy. Bilateral lateral recess stenosis. Mild bilateral foraminal stenosis. Moderate spinal stenosis. Electronically Signed   By: Kathreen Devoid   On: 06/15/2017 12:57   Ct Lumbar Spine W Contrast  Result Date: 06/07/2017 CLINICAL DATA:  Right lumbar radiculopathy. Nondiagnostic MRI of due to hardware artifact. EXAM: LUMBAR MYELOGRAM FLUOROSCOPY TIME:  3 minutes 30 seconds 98.3 mGy air kerma PROCEDURE: Informed written consent was again obtained. Patient knows she is at greater risk that most patients do to flexeril use and recent Coumadin use. She has allergies, but has tolerated IV contrast successfully in the past. Consent was obtained by Dr. Monte Fantasia. Time out form was completed. Patient was positioned prone on the fluoroscopy table. The spine was localized and prepped and draped in standard sterile fashion. 1% lidocaine was used for skin and subcutaneous tissue anesthesia where needed. A 5 inch 20 gauge spinal needle was used. Puncture was attempted at the L2-3 level, but the space between the lamina could not be found despite multiple exposures (needed to penetrate the soft tissues) and patient repositioning. The L1-2 level was then selected. This projected over the  mid L2 body, well below the conus based on prior MRI. Puncture was performed using a 5 inch 20 gauge spinal needle. Initially, CSF was not visualized. Central canal needle tip placement was confirmed in the lateral projection and ultimately low pressure CSF was seen. 15 mL of Isovue 200 M was injected into the thecal sac, with normal opacification of the nerve roots and cauda equina consistent with free flow within the subarachnoid space. I personally performed the lumbar puncture and administered the intrathecal contrast. I also personally supervised acquisition of the myelogram images. TECHNIQUE: Contiguous axial images were  obtained through the Lumbar spine after the intrathecal infusion of infusion. Coronal and sagittal reconstructions were obtained of the axial image sets. COMPARISON:  MRI 06/02/2017 FINDINGS: LUMBAR MYELOGRAM FINDINGS: High exposure study, dedicated images would be low yield in this patient who was unable to position herself without assistance. Post injection exposures were deferred. CT LUMBAR MYELOGRAM FINDINGS: Segmentation:  Normal Alignment: Slight degenerative anterolisthesis at L3-4 and retrolisthesis at L2-3. Vertebrae:  Negative for fracture, bone lesion, or endplate erosion. Conus: Normal size and morphology, tip terminating at the L1 body level. Disc levels: T12- L1: Spondylosis with ankylosing ventral osteophyte. No impingement L1-L2: Spondylosis with ankylosing ventral osteophyte. Tiny calcified right paracentral disc protrusion. No notable facet degenerative change. No impingement. L2-L3: Degenerative disc narrowing with mild endplate ridging. Facet and ligament thickening. Moderate spinal stenosis with crowding of the cauda equina. No discrete herniation or compression. Inferior foraminal effacement without L2 compression. L3-L4: Prominent facet arthropathy, especially on the right. There has been a left-sided partial facetectomy and laminotomy. Spinal stenosis is mild to  moderate on the right without discrete compression or herniation. The cauda equina is crowded. Inferior foraminal effacement without L3 compression. L4-L5: Discectomy with solid arthrodesis. No residual impingement. Posterior rod and pedicle screws are in good position. L5-S1:Moderate degenerative disc narrowing and endplate ridging. Facet arthropathy with moderate hypertrophy. The thecal sac is narrow at baseline. Right paracentral endplate ridge causes prominent impingement on the S1 nerve root as it passes in front of the hypertrophic facet. There is mild foraminal narrowing. IMPRESSION: LUMBAR MYELOGRAM IMPRESSION: Successful intrathecal injection at L1-2. CT LUMBAR MYELOGRAM IMPRESSION: 1. L5-S1 right paracentral endplate ridge which compresses the right S1 nerve root. This spur has been present since at least 2014 abdominal CT. 2. L2-3 mild to moderate spinal stenosis without discrete compression. 3. L3-4 moderate spinal stenosis in the right canal without discrete compression. Better patency of the left canal after a left laminotomy and partial facetectomy. 4. L4-5 discectomy with solid arthrodesis and no residual impingement. 5. No compressive foraminal narrowing. Electronically Signed   By: Monte Fantasia M.D.   On: 06/07/2017 16:30   Mr Lumbar Spine W Wo Contrast  Result Date: 06/02/2017 CLINICAL DATA:  Low back pain radiating into the right leg and abdomen. Symptoms are chronic but have worsened. History of prior lumbar surgery. EXAM: MRI LUMBAR SPINE WITHOUT AND WITH CONTRAST TECHNIQUE: Multiplanar and multiecho pulse sequences of the lumbar spine were obtained without and with intravenous contrast. CONTRAST:  20 ml MULTIHANCE GADOBENATE DIMEGLUMINE 529 MG/ML IV SOLN COMPARISON:  MRI lumbar spine 06/18/2012. CT abdomen and pelvis 04/06/2016. FINDINGS: Segmentation:  Standard. Alignment: Extensive artifact from L4-5 fusion hardware limits evaluation. No obvious malalignment. Vertebrae:  No fracture  or worrisome lesion. Conus medullaris: Extends to the L1 level and appears normal. Paraspinal and other soft tissues: Sigmoid diverticulosis is noted. Disc levels: T10-11, T11-12 and T12-L1 are imaged in the sagittal plane only. The central canal and foramina appear open at each level. L1-2: Very shallow right paracentral protrusion without central canal or foraminal stenosis. The protrusion appears slightly more prominent than on the prior exam. L2-3: Shallow disc bulge and ligamentum flavum thickening are seen. Mild to moderate central canal stenosis appears slightly worse than on the prior examination. The foramina are open. L3-4: Largely obscured by artifact. There does appear to be loss disc space height and some degree of spinal stenosis. The foramina are completely obscured. L4-5: Status post discectomy and fusion. The level is completely obscured. L5-S1:  Completely obscured. IMPRESSION: Extensive artifact from L4-5 fusion hardware completely obscures the L4-5 and L5-S1 disc interspaces. Evaluation of L3-4 is also severely limited but there does appear to be some disc space narrowing and central canal and stenosis at this level. Mild to moderate central canal narrowing at L2-3 due to a shallow disc bulge and ligamentum flavum thickening appears slightly worse than the prior exam. Slight increase in shallow right paracentral protrusion at L1-2 without central canal or foraminal stenosis. Electronically Signed   By: Inge Rise M.D.   On: 06/02/2017 14:14   Dg Myelogram Lumbar  Result Date: 06/07/2017 CLINICAL DATA:  Right lumbar radiculopathy. Nondiagnostic MRI of due to hardware artifact. EXAM: LUMBAR MYELOGRAM FLUOROSCOPY TIME:  3 minutes 30 seconds 98.3 mGy air kerma PROCEDURE: Informed written consent was again obtained. Patient knows she is at greater risk that most patients do to flexeril use and recent Coumadin use. She has allergies, but has tolerated IV contrast successfully in the past.  Consent was obtained by Dr. Monte Fantasia. Time out form was completed. Patient was positioned prone on the fluoroscopy table. The spine was localized and prepped and draped in standard sterile fashion. 1% lidocaine was used for skin and subcutaneous tissue anesthesia where needed. A 5 inch 20 gauge spinal needle was used. Puncture was attempted at the L2-3 level, but the space between the lamina could not be found despite multiple exposures (needed to penetrate the soft tissues) and patient repositioning. The L1-2 level was then selected. This projected over the mid L2 body, well below the conus based on prior MRI. Puncture was performed using a 5 inch 20 gauge spinal needle. Initially, CSF was not visualized. Central canal needle tip placement was confirmed in the lateral projection and ultimately low pressure CSF was seen. 15 mL of Isovue 200 M was injected into the thecal sac, with normal opacification of the nerve roots and cauda equina consistent with free flow within the subarachnoid space. I personally performed the lumbar puncture and administered the intrathecal contrast. I also personally supervised acquisition of the myelogram images. TECHNIQUE: Contiguous axial images were obtained through the Lumbar spine after the intrathecal infusion of infusion. Coronal and sagittal reconstructions were obtained of the axial image sets. COMPARISON:  MRI 06/02/2017 FINDINGS: LUMBAR MYELOGRAM FINDINGS: High exposure study, dedicated images would be low yield in this patient who was unable to position herself without assistance. Post injection exposures were deferred. CT LUMBAR MYELOGRAM FINDINGS: Segmentation:  Normal Alignment: Slight degenerative anterolisthesis at L3-4 and retrolisthesis at L2-3. Vertebrae:  Negative for fracture, bone lesion, or endplate erosion. Conus: Normal size and morphology, tip terminating at the L1 body level. Disc levels: T12- L1: Spondylosis with ankylosing ventral osteophyte. No  impingement L1-L2: Spondylosis with ankylosing ventral osteophyte. Tiny calcified right paracentral disc protrusion. No notable facet degenerative change. No impingement. L2-L3: Degenerative disc narrowing with mild endplate ridging. Facet and ligament thickening. Moderate spinal stenosis with crowding of the cauda equina. No discrete herniation or compression. Inferior foraminal effacement without L2 compression. L3-L4: Prominent facet arthropathy, especially on the right. There has been a left-sided partial facetectomy and laminotomy. Spinal stenosis is mild to moderate on the right without discrete compression or herniation. The cauda equina is crowded. Inferior foraminal effacement without L3 compression. L4-L5: Discectomy with solid arthrodesis. No residual impingement. Posterior rod and pedicle screws are in good position. L5-S1:Moderate degenerative disc narrowing and endplate ridging. Facet arthropathy with moderate hypertrophy. The thecal sac is narrow at baseline. Right paracentral endplate  ridge causes prominent impingement on the S1 nerve root as it passes in front of the hypertrophic facet. There is mild foraminal narrowing. IMPRESSION: LUMBAR MYELOGRAM IMPRESSION: Successful intrathecal injection at L1-2. CT LUMBAR MYELOGRAM IMPRESSION: 1. L5-S1 right paracentral endplate ridge which compresses the right S1 nerve root. This spur has been present since at least 2014 abdominal CT. 2. L2-3 mild to moderate spinal stenosis without discrete compression. 3. L3-4 moderate spinal stenosis in the right canal without discrete compression. Better patency of the left canal after a left laminotomy and partial facetectomy. 4. L4-5 discectomy with solid arthrodesis and no residual impingement. 5. No compressive foraminal narrowing. Electronically Signed   By: Monte Fantasia M.D.   On: 06/07/2017 16:30   Dg Fluoro Rm Gt 60 Min  Result Date: 06/06/2017 CLINICAL DATA:  Right lumbar radiculopathy. EXAM: MYELOGRAM  LUMBAR CONTRAST:  None FLUOROSCOPY TIME:  Fluoroscopy Time:  3.4 minutes Radiation Exposure Index (if provided by the fluoroscopic device): 39 mGy Number of Acquired Spot Images: 3 COMPARISON:  Abdominal CT 04/06/2016 and lumbar spine MRI 06/02/2017 FINDINGS: Informed consent was obtained from the patient. We discussed patient's recent INR and Flexeril use as additional risk factors. A time-out was performed. A scout image of the lumbar spine was obtained. The back was prepped and draped in standard sterile fashion. 1% lidocaine was used as needed in the subcutaneous tissues. The L5-S1 level was selected with a 22 gauge 5 inch needle. Despite midline needle positioning in the canal, there was a dry tap. Head of bed was tilted to increase chances of seeing low pressure CSF. The L4-5 level was then selected since it was patulous based on prior abdominal CT. The interspinous level was probed, but solid posterior bony arthrodesis was encountered. The canal was not accessed at this level. Finally, the L2-3 level was selected. Based on recent MRI, the conus is well above this level. There was also no CSF return. Patient has been NPO and with a headache recently. Dehydration may contribute. Patient was at neurologic baseline at the procedure end and understood the events. IMPRESSION: Unsuccessful myelogram due to dry taps, as above. Suggest hydration and re-attempt tomorrow. Electronically Signed   By: Monte Fantasia M.D.   On: 06/06/2017 12:05   Ir Fl Guided Loc Of Needle/cath Tip For Spinal Inject Rt  Result Date: 06/04/2017 CLINICAL DATA:  Right lower extremity radiculopathy. Right S1 radiculopathy. Calcified L5-S1 disc. FLUOROSCOPY TIME:  Radiation Exposure Index (as provided by the fluoroscopic device): 17 uGy*m2 Fluoroscopy Time:  43 seconds Number of Acquired Images:  0 PROCEDURE: Right S1 NERVE ROOT BLOCK WITH TRANSFORAMINAL EPIDURAL STEROID INJECTION Overlying skin prepped with Betadine, draped in the usual  sterile fashion, and infiltrated locally with 1% Lidocaine. Curved 22 gauge spinal needle advanced into the caudal epidural space via the right S1 sacral foramen. Diagnostic injection of 1 ml Isovue-M 200 demonstrates good epidural spread partially outlining the right S1 nerve root. No intravascular uptake. 120 mg Depo-Medrol and 1.5 ml 1% Lidocaine were then administered. No immediate complication. IMPRESSION: Technically successful right S1 selective nerve root block with transforaminal epidural steroid injection. Electronically Signed   By: San Morelle M.D.   On: 06/04/2017 10:31   Dg C-arm 1-60 Min  Result Date: 06/09/2017 CLINICAL DATA:  Elective surgery.  Lumbar fusion. EXAM: DG C-ARM 61-120 MIN; LUMBAR SPINE - 2-3 VIEW COMPARISON:  Preoperative lumbar myelogram 06/07/2017 FINDINGS: Frontal and lateral coned fluoroscopic spot views of the lumbar spine. Two level posterior fusion with  intrapedicular screws, likely L5-S1 level, however levels not well-defined given body habitus. There is an interbody spacer. Please reference operative report for more details. Total fluoroscopy time reported 8 seconds. IMPRESSION: Procedural fluoroscopy for lumbar fusion. Electronically Signed   By: Jeb Levering M.D.   On: 06/09/2017 16:10    Microbiology: Recent Results (from the past 240 hour(s))  Surgical pcr screen     Status: None   Collection Time: 06/09/17  1:23 PM  Result Value Ref Range Status   MRSA, PCR NEGATIVE NEGATIVE Final   Staphylococcus aureus NEGATIVE NEGATIVE Final    Comment:        The Xpert SA Assay (FDA approved for NASAL specimens in patients over 64 years of age), is one component of a comprehensive surveillance program.  Test performance has been validated by [facility] for patients greater than or equal to 36 year old. It is not intended to diagnose infection nor to guide or monitor treatment.      Labs: Basic Metabolic Panel:  Recent Labs Lab  06/11/17 0444 06/12/17 0424 06/13/17 0303 06/14/17 0253 06/16/17 0335  NA 138 138 139 137 138  K 3.6 4.2 3.8 3.8 3.8  CL 104 103 103 102 100*  CO2 27 29 30 25 26   GLUCOSE 131* 127* 121* 191* 155*  BUN 11 8 8 8 14   CREATININE 0.67 0.64 0.73 0.68 0.79  CALCIUM 7.9* 8.5* 8.6* 9.2 8.8*  MG  --   --   --   --  2.1   CBC:  Recent Labs Lab 06/11/17 0444 06/12/17 0424 06/13/17 0303 06/14/17 0253 06/16/17 0335  WBC 12.6* 13.7* 13.7* 15.9* 17.5*  HGB 9.4* 10.4* 9.6* 10.6* 9.9*  HCT 30.5* 33.0* 31.9* 34.2* 33.0*  MCV 86.6 86.4 85.3 83.6 85.5  PLT 217 282 279 363 419*    Signed:  Barton Dubois MD.  [facility] 06/16/2017, 10:29 AM

## 2017-06-16 NOTE — Progress Notes (Signed)
PT Cancellation Note  Patient Details Name: [name] MRN: [id number] DOB: November 26, 1955   Cancelled Treatment:    Reason Eval/Treat Not Completed: (P) Pain limiting ability to participate (Pt reports intolerable pain after transferring back to bed with NT.  Will cancel tx at this time as patient reports she wished to wait in bed for her ride to arrive.  )   Cristela Blue 06/16/2017, 2:51 PM  Governor Rooks, PTA pager [phone]

## 2017-06-16 NOTE — Progress Notes (Signed)
Pt discharged to home in stable condition.  All discharge instructions reviewed and Rx's x2 given to pt.  AKingRNBSN

## 2017-06-16 NOTE — Progress Notes (Signed)
Pt has home CPAP.  RT set up and pt state that she will turn it on herself when she is ready.  Instructed to notify RT if further assistance is needed.

## 2017-06-16 NOTE — Progress Notes (Signed)
ANTICOAGULATION CONSULT NOTE - Follow Up Consult  Pharmacy Consult for Coumadin Indication: hx of PE  Allergies  Allergen Reactions  . Latex Other (See Comments)    Stings and burns me  . Other Nausea And Vomiting and Other (See Comments)    Bleach - burns esophagus, breaks me out and burns me  . Shellfish Allergy Hives and Nausea And Vomiting  . Tape Other (See Comments)    Plastic tape irritates skin    Patient Measurements: Height: 5\' 3"  (160 cm) Weight: 282 lb (127.9 kg) IBW/kg (Calculated) : 52.4   Vital Signs: Temp: 97.8 F (36.6 C) (08/24 0423) Temp Source: Axillary (08/24 0423) BP: 109/59 (08/24 0423) Pulse Rate: 77 (08/24 0423)  Labs:  Recent Labs  06/14/17 0253 06/15/17 0402 06/16/17 0335  HGB 10.6*  --  9.9*  HCT 34.2*  --  33.0*  PLT 363  --  419*  LABPROT 13.5 16.0* 21.7*  INR 1.03 1.27 1.86  CREATININE 0.68  --  0.79    Estimated Creatinine Clearance: 96.3 mL/min (by C-G formula based on SCr of 0.79 mg/dL).   Medications:  Scheduled:  . aspirin EC  81 mg Oral Q1200  . docusate sodium  100 mg Oral BID  . enoxaparin (LOVENOX) injection  40 mg Subcutaneous Q24H  . estradiol  1 Applicatorful Vaginal QHS  . iopamidol  20 mL Intra-articular Once  . metoprolol succinate  50 mg Oral Daily  . pantoprazole  40 mg Oral Daily  . pravastatin  40 mg Oral Q1200  . senna  1 tablet Oral BID  . sodium chloride flush  3 mL Intravenous Q12H  . trimethoprim  100 mg Oral Daily  . Vitamin D (Ergocalciferol)  50,000 Units Oral Q7 days  . Warfarin - Pharmacist Dosing Inpatient   Does not apply q1800    Assessment: 61yo female s/p L5-S1 decompression and fusion on 8/17 and receiving Lovenox 40mg  SQ q24 per neurosurgery gor VTE px.  Pt to be discharged today.  INR has been climbing nicely and expect INR to be within goal tomorrow.  Have discussed recommendations with Dr. Dyann Kief.  Goal of Therapy:  INR 2-3 Monitor platelets by anticoagulation protocol: Yes    Plan:  Resume home dose of Coumadin- 2.5mg  daily except 5mg  MWF Give Lovenox today prior to discharge   Lewie Chamber., PharmD Clinical Pharmacist [facility]

## 2017-06-16 NOTE — Care Management Important Message (Signed)
Important Message  Patient Details  Name: [name] MRN: [id number] Date of Birth: 06/06/1956   Medicare Important Message Given:  Yes    [name] Abena 06/16/2017, 11:03 AM

## 2017-06-19 DIAGNOSIS — Z87440 Personal history of urinary (tract) infections: Secondary | ICD-10-CM | POA: Diagnosis not present

## 2017-06-19 DIAGNOSIS — K219 Gastro-esophageal reflux disease without esophagitis: Secondary | ICD-10-CM | POA: Diagnosis not present

## 2017-06-19 DIAGNOSIS — I10 Essential (primary) hypertension: Secondary | ICD-10-CM | POA: Diagnosis not present

## 2017-06-19 DIAGNOSIS — Z86711 Personal history of pulmonary embolism: Secondary | ICD-10-CM | POA: Diagnosis not present

## 2017-06-19 DIAGNOSIS — M5441 Lumbago with sciatica, right side: Secondary | ICD-10-CM | POA: Diagnosis not present

## 2017-06-19 DIAGNOSIS — J449 Chronic obstructive pulmonary disease, unspecified: Secondary | ICD-10-CM | POA: Diagnosis not present

## 2017-06-19 DIAGNOSIS — J45909 Unspecified asthma, uncomplicated: Secondary | ICD-10-CM | POA: Diagnosis not present

## 2017-06-19 DIAGNOSIS — I252 Old myocardial infarction: Secondary | ICD-10-CM | POA: Diagnosis not present

## 2017-06-19 DIAGNOSIS — M48061 Spinal stenosis, lumbar region without neurogenic claudication: Secondary | ICD-10-CM | POA: Diagnosis not present

## 2017-06-19 DIAGNOSIS — I2782 Chronic pulmonary embolism: Secondary | ICD-10-CM | POA: Diagnosis not present

## 2017-06-19 DIAGNOSIS — Z7901 Long term (current) use of anticoagulants: Secondary | ICD-10-CM | POA: Diagnosis not present

## 2017-06-19 DIAGNOSIS — M5416 Radiculopathy, lumbar region: Secondary | ICD-10-CM | POA: Diagnosis not present

## 2017-06-19 DIAGNOSIS — Z4789 Encounter for other orthopedic aftercare: Secondary | ICD-10-CM | POA: Diagnosis not present

## 2017-06-19 DIAGNOSIS — I2510 Atherosclerotic heart disease of native coronary artery without angina pectoris: Secondary | ICD-10-CM | POA: Diagnosis not present

## 2017-06-19 DIAGNOSIS — E785 Hyperlipidemia, unspecified: Secondary | ICD-10-CM | POA: Diagnosis not present

## 2017-06-19 DIAGNOSIS — G4733 Obstructive sleep apnea (adult) (pediatric): Secondary | ICD-10-CM | POA: Diagnosis not present

## 2017-06-21 DIAGNOSIS — Z7901 Long term (current) use of anticoagulants: Secondary | ICD-10-CM | POA: Diagnosis not present

## 2017-06-21 DIAGNOSIS — M48061 Spinal stenosis, lumbar region without neurogenic claudication: Secondary | ICD-10-CM | POA: Diagnosis not present

## 2017-06-21 DIAGNOSIS — I10 Essential (primary) hypertension: Secondary | ICD-10-CM | POA: Diagnosis not present

## 2017-06-21 DIAGNOSIS — Z87440 Personal history of urinary (tract) infections: Secondary | ICD-10-CM | POA: Diagnosis not present

## 2017-06-21 DIAGNOSIS — E785 Hyperlipidemia, unspecified: Secondary | ICD-10-CM | POA: Diagnosis not present

## 2017-06-21 DIAGNOSIS — J45909 Unspecified asthma, uncomplicated: Secondary | ICD-10-CM | POA: Diagnosis not present

## 2017-06-21 DIAGNOSIS — K219 Gastro-esophageal reflux disease without esophagitis: Secondary | ICD-10-CM | POA: Diagnosis not present

## 2017-06-21 DIAGNOSIS — M5416 Radiculopathy, lumbar region: Secondary | ICD-10-CM | POA: Diagnosis not present

## 2017-06-21 DIAGNOSIS — Z86711 Personal history of pulmonary embolism: Secondary | ICD-10-CM | POA: Diagnosis not present

## 2017-06-21 DIAGNOSIS — Z4789 Encounter for other orthopedic aftercare: Secondary | ICD-10-CM | POA: Diagnosis not present

## 2017-06-21 DIAGNOSIS — I2782 Chronic pulmonary embolism: Secondary | ICD-10-CM | POA: Diagnosis not present

## 2017-06-21 DIAGNOSIS — J449 Chronic obstructive pulmonary disease, unspecified: Secondary | ICD-10-CM | POA: Diagnosis not present

## 2017-06-21 DIAGNOSIS — I2510 Atherosclerotic heart disease of native coronary artery without angina pectoris: Secondary | ICD-10-CM | POA: Diagnosis not present

## 2017-06-21 DIAGNOSIS — I252 Old myocardial infarction: Secondary | ICD-10-CM | POA: Diagnosis not present

## 2017-06-21 DIAGNOSIS — M5441 Lumbago with sciatica, right side: Secondary | ICD-10-CM | POA: Diagnosis not present

## 2017-06-21 DIAGNOSIS — G4733 Obstructive sleep apnea (adult) (pediatric): Secondary | ICD-10-CM | POA: Diagnosis not present

## 2017-06-27 DIAGNOSIS — J449 Chronic obstructive pulmonary disease, unspecified: Secondary | ICD-10-CM | POA: Diagnosis not present

## 2017-06-27 DIAGNOSIS — I2510 Atherosclerotic heart disease of native coronary artery without angina pectoris: Secondary | ICD-10-CM | POA: Diagnosis not present

## 2017-06-27 DIAGNOSIS — I10 Essential (primary) hypertension: Secondary | ICD-10-CM | POA: Diagnosis not present

## 2017-06-27 DIAGNOSIS — Z86711 Personal history of pulmonary embolism: Secondary | ICD-10-CM | POA: Diagnosis not present

## 2017-06-27 DIAGNOSIS — I2782 Chronic pulmonary embolism: Secondary | ICD-10-CM | POA: Diagnosis not present

## 2017-06-27 DIAGNOSIS — K219 Gastro-esophageal reflux disease without esophagitis: Secondary | ICD-10-CM | POA: Diagnosis not present

## 2017-06-27 DIAGNOSIS — G4733 Obstructive sleep apnea (adult) (pediatric): Secondary | ICD-10-CM | POA: Diagnosis not present

## 2017-06-27 DIAGNOSIS — J45909 Unspecified asthma, uncomplicated: Secondary | ICD-10-CM | POA: Diagnosis not present

## 2017-06-27 DIAGNOSIS — Z4789 Encounter for other orthopedic aftercare: Secondary | ICD-10-CM | POA: Diagnosis not present

## 2017-06-27 DIAGNOSIS — M48061 Spinal stenosis, lumbar region without neurogenic claudication: Secondary | ICD-10-CM | POA: Diagnosis not present

## 2017-06-27 DIAGNOSIS — Z87440 Personal history of urinary (tract) infections: Secondary | ICD-10-CM | POA: Diagnosis not present

## 2017-06-27 DIAGNOSIS — M5416 Radiculopathy, lumbar region: Secondary | ICD-10-CM | POA: Diagnosis not present

## 2017-06-27 DIAGNOSIS — M5441 Lumbago with sciatica, right side: Secondary | ICD-10-CM | POA: Diagnosis not present

## 2017-06-27 DIAGNOSIS — Z7901 Long term (current) use of anticoagulants: Secondary | ICD-10-CM | POA: Diagnosis not present

## 2017-06-27 DIAGNOSIS — E785 Hyperlipidemia, unspecified: Secondary | ICD-10-CM | POA: Diagnosis not present

## 2017-06-27 DIAGNOSIS — I252 Old myocardial infarction: Secondary | ICD-10-CM | POA: Diagnosis not present

## 2017-06-29 DIAGNOSIS — I2782 Chronic pulmonary embolism: Secondary | ICD-10-CM | POA: Diagnosis not present

## 2017-06-29 DIAGNOSIS — I252 Old myocardial infarction: Secondary | ICD-10-CM | POA: Diagnosis not present

## 2017-06-29 DIAGNOSIS — Z7901 Long term (current) use of anticoagulants: Secondary | ICD-10-CM | POA: Diagnosis not present

## 2017-06-29 DIAGNOSIS — Z86711 Personal history of pulmonary embolism: Secondary | ICD-10-CM | POA: Diagnosis not present

## 2017-06-29 DIAGNOSIS — I2510 Atherosclerotic heart disease of native coronary artery without angina pectoris: Secondary | ICD-10-CM | POA: Diagnosis not present

## 2017-06-29 DIAGNOSIS — J45909 Unspecified asthma, uncomplicated: Secondary | ICD-10-CM | POA: Diagnosis not present

## 2017-06-29 DIAGNOSIS — M5416 Radiculopathy, lumbar region: Secondary | ICD-10-CM | POA: Diagnosis not present

## 2017-06-29 DIAGNOSIS — M48061 Spinal stenosis, lumbar region without neurogenic claudication: Secondary | ICD-10-CM | POA: Diagnosis not present

## 2017-06-29 DIAGNOSIS — M5441 Lumbago with sciatica, right side: Secondary | ICD-10-CM | POA: Diagnosis not present

## 2017-06-29 DIAGNOSIS — Z4789 Encounter for other orthopedic aftercare: Secondary | ICD-10-CM | POA: Diagnosis not present

## 2017-06-29 DIAGNOSIS — Z87440 Personal history of urinary (tract) infections: Secondary | ICD-10-CM | POA: Diagnosis not present

## 2017-06-29 DIAGNOSIS — G4733 Obstructive sleep apnea (adult) (pediatric): Secondary | ICD-10-CM | POA: Diagnosis not present

## 2017-06-29 DIAGNOSIS — I10 Essential (primary) hypertension: Secondary | ICD-10-CM | POA: Diagnosis not present

## 2017-06-29 DIAGNOSIS — E785 Hyperlipidemia, unspecified: Secondary | ICD-10-CM | POA: Diagnosis not present

## 2017-06-29 DIAGNOSIS — J449 Chronic obstructive pulmonary disease, unspecified: Secondary | ICD-10-CM | POA: Diagnosis not present

## 2017-06-29 DIAGNOSIS — K219 Gastro-esophageal reflux disease without esophagitis: Secondary | ICD-10-CM | POA: Diagnosis not present

## 2017-07-03 DIAGNOSIS — E785 Hyperlipidemia, unspecified: Secondary | ICD-10-CM | POA: Diagnosis not present

## 2017-07-03 DIAGNOSIS — I2782 Chronic pulmonary embolism: Secondary | ICD-10-CM | POA: Diagnosis not present

## 2017-07-03 DIAGNOSIS — I2510 Atherosclerotic heart disease of native coronary artery without angina pectoris: Secondary | ICD-10-CM | POA: Diagnosis not present

## 2017-07-03 DIAGNOSIS — K219 Gastro-esophageal reflux disease without esophagitis: Secondary | ICD-10-CM | POA: Diagnosis not present

## 2017-07-03 DIAGNOSIS — G4733 Obstructive sleep apnea (adult) (pediatric): Secondary | ICD-10-CM | POA: Diagnosis not present

## 2017-07-03 DIAGNOSIS — I252 Old myocardial infarction: Secondary | ICD-10-CM | POA: Diagnosis not present

## 2017-07-03 DIAGNOSIS — M5416 Radiculopathy, lumbar region: Secondary | ICD-10-CM | POA: Diagnosis not present

## 2017-07-03 DIAGNOSIS — Z7901 Long term (current) use of anticoagulants: Secondary | ICD-10-CM | POA: Diagnosis not present

## 2017-07-03 DIAGNOSIS — Z4789 Encounter for other orthopedic aftercare: Secondary | ICD-10-CM | POA: Diagnosis not present

## 2017-07-03 DIAGNOSIS — Z86711 Personal history of pulmonary embolism: Secondary | ICD-10-CM | POA: Diagnosis not present

## 2017-07-03 DIAGNOSIS — M5441 Lumbago with sciatica, right side: Secondary | ICD-10-CM | POA: Diagnosis not present

## 2017-07-03 DIAGNOSIS — Z87440 Personal history of urinary (tract) infections: Secondary | ICD-10-CM | POA: Diagnosis not present

## 2017-07-03 DIAGNOSIS — J449 Chronic obstructive pulmonary disease, unspecified: Secondary | ICD-10-CM | POA: Diagnosis not present

## 2017-07-03 DIAGNOSIS — J45909 Unspecified asthma, uncomplicated: Secondary | ICD-10-CM | POA: Diagnosis not present

## 2017-07-03 DIAGNOSIS — M48061 Spinal stenosis, lumbar region without neurogenic claudication: Secondary | ICD-10-CM | POA: Diagnosis not present

## 2017-07-03 DIAGNOSIS — I10 Essential (primary) hypertension: Secondary | ICD-10-CM | POA: Diagnosis not present

## 2017-07-05 DIAGNOSIS — K219 Gastro-esophageal reflux disease without esophagitis: Secondary | ICD-10-CM | POA: Diagnosis not present

## 2017-07-05 DIAGNOSIS — M5416 Radiculopathy, lumbar region: Secondary | ICD-10-CM | POA: Diagnosis not present

## 2017-07-05 DIAGNOSIS — Z7901 Long term (current) use of anticoagulants: Secondary | ICD-10-CM | POA: Diagnosis not present

## 2017-07-05 DIAGNOSIS — G4733 Obstructive sleep apnea (adult) (pediatric): Secondary | ICD-10-CM | POA: Diagnosis not present

## 2017-07-05 DIAGNOSIS — I2510 Atherosclerotic heart disease of native coronary artery without angina pectoris: Secondary | ICD-10-CM | POA: Diagnosis not present

## 2017-07-05 DIAGNOSIS — Z87440 Personal history of urinary (tract) infections: Secondary | ICD-10-CM | POA: Diagnosis not present

## 2017-07-05 DIAGNOSIS — I252 Old myocardial infarction: Secondary | ICD-10-CM | POA: Diagnosis not present

## 2017-07-05 DIAGNOSIS — J449 Chronic obstructive pulmonary disease, unspecified: Secondary | ICD-10-CM | POA: Diagnosis not present

## 2017-07-05 DIAGNOSIS — I2782 Chronic pulmonary embolism: Secondary | ICD-10-CM | POA: Diagnosis not present

## 2017-07-05 DIAGNOSIS — J45909 Unspecified asthma, uncomplicated: Secondary | ICD-10-CM | POA: Diagnosis not present

## 2017-07-05 DIAGNOSIS — M48061 Spinal stenosis, lumbar region without neurogenic claudication: Secondary | ICD-10-CM | POA: Diagnosis not present

## 2017-07-05 DIAGNOSIS — Z86711 Personal history of pulmonary embolism: Secondary | ICD-10-CM | POA: Diagnosis not present

## 2017-07-05 DIAGNOSIS — M5441 Lumbago with sciatica, right side: Secondary | ICD-10-CM | POA: Diagnosis not present

## 2017-07-05 DIAGNOSIS — E785 Hyperlipidemia, unspecified: Secondary | ICD-10-CM | POA: Diagnosis not present

## 2017-07-05 DIAGNOSIS — Z4789 Encounter for other orthopedic aftercare: Secondary | ICD-10-CM | POA: Diagnosis not present

## 2017-07-05 DIAGNOSIS — I10 Essential (primary) hypertension: Secondary | ICD-10-CM | POA: Diagnosis not present

## 2017-07-16 DIAGNOSIS — M5416 Radiculopathy, lumbar region: Secondary | ICD-10-CM | POA: Diagnosis not present

## 2017-08-03 DIAGNOSIS — Z7901 Long term (current) use of anticoagulants: Secondary | ICD-10-CM | POA: Diagnosis not present

## 2017-08-03 DIAGNOSIS — I2699 Other pulmonary embolism without acute cor pulmonale: Secondary | ICD-10-CM | POA: Diagnosis not present

## 2017-08-03 DIAGNOSIS — I809 Phlebitis and thrombophlebitis of unspecified site: Secondary | ICD-10-CM | POA: Diagnosis not present

## 2017-08-15 DIAGNOSIS — M5416 Radiculopathy, lumbar region: Secondary | ICD-10-CM | POA: Diagnosis not present

## 2017-08-23 DIAGNOSIS — M48062 Spinal stenosis, lumbar region with neurogenic claudication: Secondary | ICD-10-CM | POA: Diagnosis not present

## 2017-09-15 DIAGNOSIS — M5416 Radiculopathy, lumbar region: Secondary | ICD-10-CM | POA: Diagnosis not present

## 2017-09-20 DIAGNOSIS — Z7901 Long term (current) use of anticoagulants: Secondary | ICD-10-CM | POA: Diagnosis not present

## 2017-09-20 DIAGNOSIS — I809 Phlebitis and thrombophlebitis of unspecified site: Secondary | ICD-10-CM | POA: Diagnosis not present

## 2017-09-20 DIAGNOSIS — I2699 Other pulmonary embolism without acute cor pulmonale: Secondary | ICD-10-CM | POA: Diagnosis not present

## 2017-10-08 ENCOUNTER — Emergency Department (HOSPITAL_COMMUNITY)
Admission: EM | Admit: 2017-10-08 | Discharge: 2017-10-08 | Disposition: A | Discharge status: Home / Self Care | Payer: Medicare Other | Source: Home / Self Care | Attending: Emergency Medicine | Admitting: Emergency Medicine

## 2017-10-08 ENCOUNTER — Other Ambulatory Visit: Payer: Self-pay

## 2017-10-08 ENCOUNTER — Encounter (HOSPITAL_COMMUNITY): Payer: Self-pay

## 2017-10-08 ENCOUNTER — Emergency Department (HOSPITAL_COMMUNITY)
Admission: EM | Admit: 2017-10-08 | Discharge: 2017-10-09 | Disposition: A | Discharge status: Home / Self Care | Payer: Medicare Other | Attending: Emergency Medicine | Admitting: Emergency Medicine

## 2017-10-08 ENCOUNTER — Encounter (HOSPITAL_COMMUNITY): Payer: Self-pay | Admitting: Emergency Medicine

## 2017-10-08 DIAGNOSIS — G8929 Other chronic pain: Secondary | ICD-10-CM

## 2017-10-08 DIAGNOSIS — J449 Chronic obstructive pulmonary disease, unspecified: Secondary | ICD-10-CM | POA: Insufficient documentation

## 2017-10-08 DIAGNOSIS — J45909 Unspecified asthma, uncomplicated: Secondary | ICD-10-CM | POA: Insufficient documentation

## 2017-10-08 DIAGNOSIS — Z79899 Other long term (current) drug therapy: Secondary | ICD-10-CM

## 2017-10-08 DIAGNOSIS — M545 Low back pain: Secondary | ICD-10-CM | POA: Diagnosis not present

## 2017-10-08 DIAGNOSIS — Z91040 Latex allergy status: Secondary | ICD-10-CM | POA: Insufficient documentation

## 2017-10-08 DIAGNOSIS — Z7982 Long term (current) use of aspirin: Secondary | ICD-10-CM | POA: Diagnosis not present

## 2017-10-08 DIAGNOSIS — M546 Pain in thoracic spine: Secondary | ICD-10-CM | POA: Diagnosis not present

## 2017-10-08 DIAGNOSIS — M5124 Other intervertebral disc displacement, thoracic region: Secondary | ICD-10-CM

## 2017-10-08 DIAGNOSIS — I10 Essential (primary) hypertension: Secondary | ICD-10-CM | POA: Insufficient documentation

## 2017-10-08 DIAGNOSIS — M549 Dorsalgia, unspecified: Secondary | ICD-10-CM

## 2017-10-08 DIAGNOSIS — R52 Pain, unspecified: Secondary | ICD-10-CM | POA: Diagnosis not present

## 2017-10-08 MED ORDER — ONDANSETRON HCL 4 MG/2ML IJ SOLN
4.0000 mg | Freq: Once | INTRAMUSCULAR | Status: AC
  Administered 2017-10-08: 4 mg via INTRAVENOUS
  Filled 2017-10-08: qty 2

## 2017-10-08 MED ORDER — METHOCARBAMOL 1000 MG/10ML IJ SOLN
500.0000 mg | Freq: Once | INTRAVENOUS | Status: AC
  Administered 2017-10-09: 500 mg via INTRAVENOUS
  Filled 2017-10-08: qty 550

## 2017-10-08 MED ORDER — PREDNISONE 20 MG PO TABS
60.0000 mg | ORAL_TABLET | ORAL | Status: AC
  Administered 2017-10-08: 60 mg via ORAL
  Filled 2017-10-08: qty 3

## 2017-10-08 MED ORDER — KETOROLAC TROMETHAMINE 30 MG/ML IJ SOLN: 15.0000 mg | Freq: Once | INTRAMUSCULAR | Status: DC

## 2017-10-08 MED ORDER — OXYCODONE-ACETAMINOPHEN 5-325 MG PO TABS
1.0000 | ORAL_TABLET | Freq: Once | ORAL | Status: AC
  Administered 2017-10-08: 1 via ORAL
  Filled 2017-10-08: qty 1

## 2017-10-08 MED ORDER — ACETAMINOPHEN 500 MG PO TABS
1000.0000 mg | ORAL_TABLET | Freq: Once | ORAL | Status: AC
  Administered 2017-10-08: 1000 mg via ORAL
  Filled 2017-10-08: qty 2

## 2017-10-08 MED ORDER — PREDNISONE 20 MG PO TABS: 40.0000 mg | ORAL_TABLET | Freq: Every day | ORAL | 0 refills | Status: DC

## 2017-10-08 MED ORDER — LORAZEPAM 1 MG PO TABS
1.0000 mg | ORAL_TABLET | Freq: Once | ORAL | Status: AC
  Administered 2017-10-08: 1 mg via ORAL
  Filled 2017-10-08: qty 1

## 2017-10-08 MED ORDER — KETOROLAC TROMETHAMINE 30 MG/ML IJ SOLN
15.0000 mg | Freq: Once | INTRAMUSCULAR | Status: AC
  Administered 2017-10-09: 15 mg via INTRAVENOUS
  Filled 2017-10-08: qty 1

## 2017-10-08 MED ORDER — OXYCODONE HCL 10 MG PO TABS: 10.0000 mg | ORAL_TABLET | Freq: Four times a day (QID) | ORAL | 0 refills | Status: DC | PRN

## 2017-10-08 MED ORDER — KETOROLAC TROMETHAMINE 30 MG/ML IJ SOLN
15.0000 mg | Freq: Once | INTRAMUSCULAR | Status: AC
  Administered 2017-10-08: 15 mg via INTRAVENOUS
  Filled 2017-10-08: qty 1

## 2017-10-08 MED ORDER — HYDROMORPHONE HCL 1 MG/ML IJ SOLN
1.0000 mg | Freq: Once | INTRAMUSCULAR | Status: AC
  Administered 2017-10-09: 1 mg via INTRAVENOUS
  Filled 2017-10-08: qty 1

## 2017-10-08 NOTE — ED Triage Notes (Signed)
Pt states that she has chronic back pain from past spinal fusions, pain in worse over the past five days.

## 2017-10-08 NOTE — ED Notes (Signed)
Patient states back pain is unchanged after toradol, ativan, and prednisone.

## 2017-10-08 NOTE — ED Notes (Signed)
PureWhik placed on pt.

## 2017-10-08 NOTE — ED Triage Notes (Signed)
Pt arriving from home with complaints of chronic back pain that has been present for the last 4-5 months. Pt reports pain has gotten worse in the last 6 days. Pt reports a "popping" sensation in her back. Pt was seen today at [facility] for same. Pt was given medication for pain and nausea earlier today. Pt states pain was severe at 8pm and took an Oxycodone but says it did not help her pain. Pt also complaining of bilateral shoulder and knee pain. Pt has had knee replacement in the past as well as back surgery (dates unknown at this time). Pt denies any chest pain and respiratory distress. Pt received 53mcg fentanyl prior to arrival.   140/80 Hr 90 Resp 18 94% RA Pt has 20g IV in left hand

## 2017-10-08 NOTE — ED Provider Notes (Signed)
[facility] EMERGENCY DEPARTMENT Provider Note   CSN: [id number] Arrival date & time: 10/08/17  0600     History   Chief Complaint Chief Complaint  Patient presents with  . Back Pain    HPI [name] is a 61 y.o. female.  HPI  Patient presents due to concern of worsening of back pain. She notes that the pain is present from her cervical through her lumbar spine.  She notes a long history of back pain, has had fusion procedure performed 4 months ago, as well as prior interventions. No recent fall, trauma. She was previously affiliated with a pain management clinic, but is not currently seeing any primary care physician, nor following up with her orthopedic team. At home she takes narcotics, muscle relaxants. She notes that over the past 5 or 6 days she has had worsening pain throughout the back, midline. No new weakness in her legs, though she has chronic ambulation difficulty secondary to pain. No new incontinence, fever, vomiting.  No other recent medication changes, diet changes, activity changes.  She is here with her brother who assists with the HPI.  Past Medical History:  Diagnosis Date  . Acid reflux   . Asthma   . Chronic pain   . COPD (chronic obstructive pulmonary disease) ([place])   . Diverticulitis   . Heart murmur   . Hiatal hernia   . High cholesterol   . History of UTI   . Hypertension   . Labral tear of shoulder    left  . Myocardial infarction ([place]) 2014  . Pneumonia 2014  . Pulmonary embolism ([place]) last 2014   x 3   . Ruptured disc, cervical    1 in neck, 1 in midback and 1 in low back, 6 bulging discs,  upper and lower back, 5 slipped discs all the way uop and down back and   . Sleep apnea    uses cpap, pt does not know settings    Patient Active Problem List   Diagnosis Date Noted  . Lumbar radiculopathy, chronic   . Essential hypertension   . Lumbar back pain with radiculopathy affecting right lower extremity  06/05/2017  . Palliative care by specialist   . Right leg pain 06/02/2017  . Severe low back pain 06/02/2017  . Acute right-sided low back pain with sciatica   . Chronic pulmonary embolism ([place]) 04/17/2013  . UTI (urinary tract infection) 04/17/2013  . Diverticulitis of sigmoid colon 04/16/2013  . Obesity, Class III, BMI 40-49.9 (morbid obesity) ([place]) 04/16/2013  . Chronic narcotic dependence ([place]) 04/16/2013  . Asthma 11/16/2011  . GERD (gastroesophageal reflux disease) 11/16/2011  . Hyperlipidemia 11/16/2011    Past Surgical History:  Procedure Laterality Date  .  bone spurs removed from feet Bilateral   . ABDOMINAL HYSTERECTOMY    . BACK SURGERY     fusion x 2 lower back, laser surgery mid and back and neck also done  . CARPAL TUNNEL RELEASE Right   . CHOLECYSTECTOMY    . COLONOSCOPY WITH PROPOFOL N/A 04/08/2016   Procedure: COLONOSCOPY WITH PROPOFOL;  Surgeon: Carol Ada, MD;  Location: [facility] ENDOSCOPY;  Service: Endoscopy;  Laterality: N/A;  . colonscopy     x 2, with polyps removed both times  . IR FL GUIDED LOC OF NEEDLE/CATH TIP FOR SPINAL INJECTION RT  06/04/2017  . JOINT REPLACEMENT    . REPLACEMENT TOTAL KNEE Right   . TUBAL LIGATION      OB  History    No data available       Home Medications    Prior to Admission medications   Medication Sig Start Date End Date Taking? Authorizing Provider  albuterol (PROVENTIL HFA;VENTOLIN HFA) 108 (90 BASE) MCG/ACT inhaler Inhale 2 puffs into the lungs every 6 (six) hours as needed. For wheezing 02/28/13   Janece Canterbury, MD  aspirin EC 81 MG tablet Take 81 mg by mouth daily at 12 noon.     [provider]  diazepam (VALIUM) 10 MG tablet Take 10 mg by mouth every 8 (eight) hours as needed for anxiety.     [provider]  diclofenac sodium (VOLTAREN) 1 % GEL Apply 2 g topically 4 (four) times daily as needed (For pain in shoulder, knees, back and neck.).     [provider]  docusate sodium (COLACE)  100 MG capsule Take 1 capsule (100 mg total) by mouth 2 (two) times daily. 06/16/17   Barton Dubois, MD  estradiol (ESTRACE) 0.1 MG/GM vaginal cream Place 1 Applicatorful vaginally at bedtime.    [provider]  furosemide (LASIX) 40 MG tablet Take 40 mg by mouth 2 (two) times daily as needed for fluid. For fluid retention.    [provider]  lidocaine (LIDODERM) 5 % Place 1-3 patches onto the skin daily. Remove & Discard patch within 12 hours or as directed by MD    [provider]  losartan (COZAAR) 50 MG tablet Take 50 mg by mouth daily at 12 noon.     [provider]  methocarbamol (ROBAXIN) 500 MG tablet Take 1 tablet (500 mg total) by mouth every 6 (six) hours as needed for muscle spasms. 06/16/17   Barton Dubois, MD  metoprolol succinate (TOPROL-XL) 50 MG 24 hr tablet Take 50 mg by mouth daily. Take with or immediately following a meal.     [provider]  nitroGLYCERIN (NITROSTAT) 0.4 MG SL tablet Place 0.4 mg under the tongue every 5 (five) minutes as needed for chest pain.    [provider]  nystatin [facility]) powder Apply 1 g topically daily as needed (rash).    [provider]  omeprazole (PRILOSEC) 40 MG capsule Take 40 mg by mouth 2 (two) times daily.     [provider]  Oxycodone HCl 10 MG TABS Take 1 tablet (10 mg total) by mouth every 6 (six) hours as needed (for severe pain). 06/16/17   Barton Dubois, MD  pravastatin (PRAVACHOL) 40 MG tablet Take 40 mg by mouth daily at 12 noon.     [provider]  trimethoprim (TRIMPEX) 100 MG tablet Take 100 mg by mouth daily.    [provider]  Vitamin D, Ergocalciferol, (DRISDOL) 50000 units CAPS capsule Take 50,000 Units by mouth every 7 (seven) days.    [provider]  warfarin (COUMADIN) 5 MG tablet Take 2.5-5 mg by mouth See admin instructions. Take one tablet (5mg ) on Monday, Wednesday, Friday and half a tablet (2.5mg ) the rest of the  week. 07/20/15   [provider]    Family History Family History  Problem Relation Age of Onset  . Heart failure Mother   . COPD Mother   . Heart failure Father   . COPD Father   . Cancer Other   . Heart failure Other     Social History Social History   Tobacco Use  . Smoking status: Never Smoker  . Smokeless tobacco: Never Used  Substance Use Topics  .  Alcohol use: No  . Drug use: No     Allergies   Latex; Other; Shellfish allergy; and Tape   Review of Systems Review of Systems  Constitutional:       Per HPI, otherwise negative  HENT:       Per HPI, otherwise negative  Respiratory:       Per HPI, otherwise negative  Cardiovascular:       Per HPI, otherwise negative  Gastrointestinal: Negative for vomiting.  Endocrine:       Negative aside from HPI  Genitourinary:       Neg aside from HPI   Musculoskeletal:       Per HPI, otherwise negative  Skin: Negative.   Neurological: Negative for syncope.     Physical Exam Updated Vital Signs BP (!) 106/53   Pulse 87   Temp 97.9 F (36.6 C) (Oral)   Resp 18   SpO2 97%   Physical Exam  Constitutional: She is oriented to person, place, and time. She appears well-developed and well-nourished.  Obese elderly appearing female awake and alert, visibly in discomfort, moving all extremities spontaneously, speaking clearly  HENT:  Head: Normocephalic and atraumatic.  Eyes: Conjunctivae and EOM are normal.  Cardiovascular: Normal rate and regular rhythm.  Pulmonary/Chest: Effort normal and breath sounds normal. No stridor. No respiratory distress.  Abdominal: She exhibits no distension. There is no tenderness.  Musculoskeletal: She exhibits no edema.  Neurological: She is alert and oriented to person, place, and time. She displays no atrophy and no tremor. No cranial nerve deficit. She exhibits normal muscle tone. She displays no seizure activity.  Skin: Skin is warm and dry.  Psychiatric: She has a normal  mood and affect.  Nursing note and vitals reviewed.    ED Treatments / Results  Labs (all labs ordered are listed, but only abnormal results are displayed) Labs Reviewed - No data to display  EKG  EKG Interpretation None       Radiology No results found.  Procedures Procedures (including critical care time)  Medications Ordered in ED Medications  ketorolac (TORADOL) 30 MG/ML injection 15 mg (not administered)  predniSONE (DELTASONE) tablet 60 mg (not administered)  LORazepam (ATIVAN) tablet 1 mg (not administered)     Initial Impression / Assessment and Plan / ED Course  I have reviewed the triage vital signs and the nursing notes.  Pertinent labs & imaging results that were available during my care of the patient were reviewed by me and considered in my medical decision making (see chart for details).  Chart review performed after the patient's evaluation notable for admission several months ago following her most recent spine intervention, pertinent notes as below 8/12 Right S1 nerve root epidural steroid injection performed for Right S1 radiculopathy secondary to stenosis at L5/S1 8/17 Removal of [place] hardware L4-L5, decompression of L5-S1 via laminectomy and total discectomy with posterior lumbar interbody arthrodesis using peek spacer local autograft and allograft pedicle screw fixation L5-S1 posterior lateral arthrodesis with local autograft and allograft L5-S1  Chart review notable for use of narcotic and muscle relaxant for pain control. Here, after the patient's initial anti-inflammatory, muscle relaxant, steroid.   9:39 AM Patient appears much more calm, speaking clearly, no longer appears substantially uncomfortable. We had a lengthy conversation about her recent history, and lack of follow-up after her recent discharge. She notes that she has not followed up with primary care, nor orthopedics. We discussed the importance of doing so, and options for  additional home care. Patient has no neurologic deficits, no evidence for distress, no evidence for infection, and given her history of chronic pain, there is suspicion for acute on chronic disease. Patient will be discharged with short course of medication, and social work, nursing, physical therapy referral for home assessment, facilitation of outpatient follow-up.  Final Clinical Impressions(s) / ED Diagnoses  Acute on chronic back pain   Carmin Muskrat, MD 10/08/17 [phone]

## 2017-10-08 NOTE — Discharge Instructions (Signed)
As discussed, it is very important that he follow-up with primary care, to facilitate ongoing management of your back pain. You will be contacted by our home health services in the next day or 2 as well.

## 2017-10-09 ENCOUNTER — Emergency Department (HOSPITAL_COMMUNITY): Payer: Medicare Other

## 2017-10-09 DIAGNOSIS — M546 Pain in thoracic spine: Secondary | ICD-10-CM | POA: Diagnosis not present

## 2017-10-09 LAB — URINALYSIS, ROUTINE W REFLEX MICROSCOPIC
BILIRUBIN URINE: NEGATIVE
Glucose, UA: NEGATIVE mg/dL
HGB URINE DIPSTICK: NEGATIVE
KETONES UR: 5 mg/dL — AB
Leukocytes, UA: NEGATIVE
Nitrite: NEGATIVE
Protein, ur: NEGATIVE mg/dL
SPECIFIC GRAVITY, URINE: 1.019 (ref 1.005–1.030)
pH: 6 (ref 5.0–8.0)

## 2017-10-09 LAB — I-STAT CHEM 8, ED
BUN: 12 mg/dL (ref 6–20)
CALCIUM ION: 1.2 mmol/L (ref 1.15–1.40)
CHLORIDE: 102 mmol/L (ref 101–111)
Creatinine, Ser: 0.6 mg/dL (ref 0.44–1.00)
GLUCOSE: 134 mg/dL — AB (ref 65–99)
HCT: 37 % (ref 36.0–46.0)
HEMOGLOBIN: 12.6 g/dL (ref 12.0–15.0)
Potassium: 3.5 mmol/L (ref 3.5–5.1)
SODIUM: 140 mmol/L (ref 135–145)
TCO2: 26 mmol/L (ref 22–32)

## 2017-10-09 MED ORDER — ONDANSETRON HCL 4 MG/2ML IJ SOLN
4.0000 mg | Freq: Once | INTRAMUSCULAR | Status: AC
  Administered 2017-10-09: 4 mg via INTRAVENOUS
  Filled 2017-10-09: qty 2

## 2017-10-09 MED ORDER — OXYCODONE-ACETAMINOPHEN 5-325 MG PO TABS
2.0000 | ORAL_TABLET | Freq: Once | ORAL | Status: AC
  Administered 2017-10-09: 2 via ORAL
  Filled 2017-10-09: qty 2

## 2017-10-09 MED ORDER — DEXAMETHASONE SODIUM PHOSPHATE 10 MG/ML IJ SOLN
10.0000 mg | Freq: Once | INTRAMUSCULAR | Status: AC
  Administered 2017-10-09: 10 mg via INTRAVENOUS
  Filled 2017-10-09: qty 1

## 2017-10-09 MED ORDER — HYDROMORPHONE HCL 1 MG/ML IJ SOLN
0.5000 mg | Freq: Once | INTRAMUSCULAR | Status: AC
  Administered 2017-10-09: 0.5 mg via INTRAVENOUS
  Filled 2017-10-09: qty 1

## 2017-10-09 MED ORDER — PROCHLORPERAZINE EDISYLATE 5 MG/ML IJ SOLN
10.0000 mg | Freq: Once | INTRAMUSCULAR | Status: AC
Start: 2017-10-09 — End: 2017-10-09
  Administered 2017-10-09: 10 mg via INTRAVENOUS
  Filled 2017-10-09: qty 2

## 2017-10-09 MED ORDER — HYDROMORPHONE HCL 1 MG/ML IJ SOLN
1.0000 mg | Freq: Once | INTRAMUSCULAR | Status: AC
  Administered 2017-10-09: 1 mg via INTRAVENOUS
  Filled 2017-10-09: qty 1

## 2017-10-09 MED ORDER — LIDOCAINE 5 % EX PTCH
1.0000 | MEDICATED_PATCH | CUTANEOUS | Status: DC
  Administered 2017-10-09: 1 via TRANSDERMAL
  Filled 2017-10-09: qty 1

## 2017-10-09 MED ORDER — KETOROLAC TROMETHAMINE 30 MG/ML IJ SOLN
15.0000 mg | Freq: Once | INTRAMUSCULAR | Status: AC
  Administered 2017-10-09: 15 mg via INTRAVENOUS
  Filled 2017-10-09: qty 1

## 2017-10-09 NOTE — ED Provider Notes (Addendum)
[facility] DEPT Provider Note   CSN: [id number] Arrival date & time: 10/08/17  2316    History   Chief Complaint Chief Complaint  Patient presents with  . Back Pain    HPI [name] is a 61 y.o. female.  61 year old female with a history of chronic pain, COPD, dyslipidemia, hypertension presents to the emergency department for evaluation of thoracic back pain.  Patient states that she has a history of chronic back pain.  She was previously followed by a pain management clinic 3 years ago, but elected to stop following up with this doctor.  She has her back pain currently managed by her neurosurgeon, Dr. Ellene Route.  She has been taking her home muscle relaxer and oxycodone, but this has provided little relief of her pain.  She reports feeling a "popping" sensation in her mid back 6 days ago after turning over in bed.  Her pain has been worsening since this time.  It has been specifically worse over the past 48 hours.  She denies any associated genital numbness, new or worsening bowel or bladder incontinence, extremity numbness or paresthesias.  No inability to lift her arms or legs.  She has remained ambulatory with a walker, but ambulation is limited secondary to pain.  No fevers, history of cancer, history of IV drug use.  She denies any abdominal pain, nausea, vomiting.  Pain is not aggravated with deep breathing.  She was seen at [facility] for the symptoms yesterday.  Pain was mildly managed with Toradol, Ativan, prednisone, but recurred shortly after arriving home.      Past Medical History:  Diagnosis Date  . Acid reflux   . Asthma   . Chronic pain   . COPD (chronic obstructive pulmonary disease) ([place])   . Diverticulitis   . Heart murmur   . Hiatal hernia   . High cholesterol   . History of UTI   . Hypertension   . Labral tear of shoulder    left  . Myocardial infarction ([place]) 2014  . Pneumonia 2014  . Pulmonary embolism  ([place]) last 2014   x 3   . Ruptured disc, cervical    1 in neck, 1 in midback and 1 in low back, 6 bulging discs,  upper and lower back, 5 slipped discs all the way uop and down back and   . Sleep apnea    uses cpap, pt does not know settings    Patient Active Problem List   Diagnosis Date Noted  . Lumbar radiculopathy, chronic   . Essential hypertension   . Lumbar back pain with radiculopathy affecting right lower extremity 06/05/2017  . Palliative care by specialist   . Right leg pain 06/02/2017  . Severe low back pain 06/02/2017  . Acute right-sided low back pain with sciatica   . Chronic pulmonary embolism ([place]) 04/17/2013  . UTI (urinary tract infection) 04/17/2013  . Diverticulitis of sigmoid colon 04/16/2013  . Obesity, Class III, BMI 40-49.9 (morbid obesity) ([place]) 04/16/2013  . Chronic narcotic dependence ([place]) 04/16/2013  . Asthma 11/16/2011  . GERD (gastroesophageal reflux disease) 11/16/2011  . Hyperlipidemia 11/16/2011    Past Surgical History:  Procedure Laterality Date  .  bone spurs removed from feet Bilateral   . ABDOMINAL HYSTERECTOMY    . BACK SURGERY     fusion x 2 lower back, laser surgery mid and back and neck also done  . CARPAL TUNNEL RELEASE Right   . CHOLECYSTECTOMY    .  COLONOSCOPY WITH PROPOFOL N/A 04/08/2016   Procedure: COLONOSCOPY WITH PROPOFOL;  Surgeon: Carol Ada, MD;  Location: [facility] ENDOSCOPY;  Service: Endoscopy;  Laterality: N/A;  . colonscopy     x 2, with polyps removed both times  . IR FL GUIDED LOC OF NEEDLE/CATH TIP FOR SPINAL INJECTION RT  06/04/2017  . JOINT REPLACEMENT    . REPLACEMENT TOTAL KNEE Right   . TUBAL LIGATION      OB History    No data available       Home Medications    Prior to Admission medications   Medication Sig Start Date End Date Taking? Authorizing Provider  albuterol (PROVENTIL HFA;VENTOLIN HFA) 108 (90 BASE) MCG/ACT inhaler Inhale 2 puffs into the lungs every 6 (six) hours as needed. For wheezing  02/28/13   Janece Canterbury, MD  aspirin EC 81 MG tablet Take 81 mg by mouth daily at 12 noon.     [provider]  diazepam (VALIUM) 10 MG tablet Take 10 mg by mouth every 8 (eight) hours as needed for anxiety.     [provider]  diclofenac sodium (VOLTAREN) 1 % GEL Apply 2 g topically 4 (four) times daily as needed (For pain in shoulder, knees, back and neck.).     [provider]  docusate sodium (COLACE) 100 MG capsule Take 1 capsule (100 mg total) by mouth 2 (two) times daily. 06/16/17   Barton Dubois, MD  estradiol (ESTRACE) 0.1 MG/GM vaginal cream Place 1 Applicatorful vaginally at bedtime.    [provider]  furosemide (LASIX) 40 MG tablet Take 40 mg by mouth 2 (two) times daily as needed for fluid. For fluid retention.    [provider]  lidocaine (LIDODERM) 5 % Place 1-3 patches onto the skin daily. Remove & Discard patch within 12 hours or as directed by MD    [provider]  losartan (COZAAR) 50 MG tablet Take 50 mg by mouth daily at 12 noon.     [provider]  metoprolol succinate (TOPROL-XL) 50 MG 24 hr tablet Take 50 mg by mouth daily. Take with or immediately following a meal.     [provider]  nitroGLYCERIN (NITROSTAT) 0.4 MG SL tablet Place 0.4 mg under the tongue every 5 (five) minutes as needed for chest pain.    [provider]  nystatin [facility]) powder Apply 1 g topically daily as needed (rash).    [provider]  omeprazole (PRILOSEC) 40 MG capsule Take 40 mg by mouth 2 (two) times daily.     [provider]  Oxycodone HCl 10 MG TABS Take 1 tablet (10 mg total) by mouth every 6 (six) hours as needed (for severe pain). 10/08/17   Carmin Muskrat, MD  pravastatin (PRAVACHOL) 40 MG tablet Take 40 mg by mouth daily at 12 noon.     [provider]  predniSONE (DELTASONE) 20 MG tablet Take 2 tablets (40 mg total) by mouth daily with breakfast. For the next four days  10/08/17   Carmin Muskrat, MD  trimethoprim (TRIMPEX) 100 MG tablet Take 100 mg by mouth daily.    [provider]  Vitamin D, Ergocalciferol, (DRISDOL) 50000 units CAPS capsule Take 50,000 Units by mouth every 7 (seven) days.    [provider]  warfarin (COUMADIN) 5 MG tablet Take 2.5-5 mg by mouth See admin instructions. Take one tablet (5mg ) on Monday, Wednesday, Friday and half a tablet (2.5mg ) the rest of the week. 07/20/15  [provider]    Family History Family History  Problem Relation Age of Onset  . Heart failure Mother   . COPD Mother   . Heart failure Father   . COPD Father   . Cancer Other   . Heart failure Other     Social History Social History   Tobacco Use  . Smoking status: Never Smoker  . Smokeless tobacco: Never Used  Substance Use Topics  . Alcohol use: No  . Drug use: No     Allergies   Latex; Other; Shellfish allergy; and Tape   Review of Systems Review of Systems Ten systems reviewed and are negative for acute change, except as noted in the HPI.    Physical Exam Updated Vital Signs BP 126/80   Pulse 89   Temp 98 F (36.7 C) (Oral)   Resp 16   SpO2 96%   Physical Exam  Constitutional: She is oriented to person, place, and time. She appears well-developed and well-nourished. No distress.  Nontoxic appearing and in NAD, but does appear uncomfortable.  HENT:  Head: Normocephalic and atraumatic.  Eyes: Conjunctivae and EOM are normal. No scleral icterus.  Neck: Normal range of motion.  Normal ROM.  Cardiovascular: Normal rate, regular rhythm and intact distal pulses.  DP pulse 2+ in the BLE  Pulmonary/Chest: Effort normal. No respiratory distress.  Respirations even and unlabored  Musculoskeletal:       Thoracic back: She exhibits tenderness, bony tenderness and pain. She exhibits no deformity and no spasm.       Back:  Multiple areas of tenderness to the thoracic back without bony deformity, step-off,  or crepitus to the midline.  No appreciable spasms.  Well-healed lumbar midline surgical incision.  Neurological: She is alert and oriented to person, place, and time. She exhibits normal muscle tone. Coordination normal.  Moving extremities spontaneously. Is able to transition and move in the bed without assistance. Sensation to light touch intact in BLE.  Skin: Skin is warm and dry. No rash noted. She is not diaphoretic. No erythema. No pallor.  Psychiatric: She has a normal mood and affect. Her behavior is normal.  Nursing note and vitals reviewed.    ED Treatments / Results  Labs (all labs ordered are listed, but only abnormal results are displayed) Labs Reviewed  URINALYSIS, ROUTINE W REFLEX MICROSCOPIC - Abnormal; Notable for the following components:      Result Value   APPearance HAZY (*)    Ketones, ur 5 (*)    All other components within normal limits  I-STAT CHEM 8, ED - Abnormal; Notable for the following components:   Glucose, Bld 134 (*)    All other components within normal limits    EKG  EKG Interpretation None       Radiology No results found.  Procedures Procedures (including critical care time)  Medications Ordered in ED Medications  lidocaine (LIDODERM) 5 % 1 patch (1 patch Transdermal Patch Applied 10/09/17 0235)  oxyCODONE-acetaminophen (PERCOCET/ROXICET) 5-325 MG per tablet 2 tablet (2 tablets Oral Not Given 10/09/17 0420)  methocarbamol (ROBAXIN) 500 mg in dextrose 5 % 50 mL IVPB (0 mg Intravenous Stopped 10/09/17 0100)  HYDROmorphone (DILAUDID) injection 1 mg (1 mg Intravenous Given 10/09/17 0021)  ketorolac (TORADOL) 30 MG/ML injection 15 mg (15 mg Intravenous Given 10/09/17 0021)  HYDROmorphone (DILAUDID) injection 0.5 mg (0.5 mg Intravenous Given 10/09/17 0207)  dexamethasone (DECADRON) injection 10 mg (10 mg Intravenous Given 10/09/17 0225)  ketorolac (TORADOL) 30 MG/ML  injection 15 mg (15 mg Intravenous Given 10/09/17 0231)  ondansetron  (ZOFRAN) injection 4 mg (4 mg Intravenous Given 10/09/17 0223)     Initial Impression / Assessment and Plan / ED Course  I have reviewed the triage vital signs and the nursing notes.  Pertinent labs & imaging results that were available during my care of the patient were reviewed by me and considered in my medical decision making (see chart for details).  Clinical Course as of Oct 09 1958  Mon Oct 09, 2017  0827 IMPRESSION: 1. No acute finding. 2. Diffuse thoracic spondylosis with multi-level ankylosis. 3. Multiple small and noncompressive disc protrusions listed above. A T5-6 protrusion contacts the ventral cord with mild remodeling. No foraminal impingement.  MR THORACIC SPINE WO CONTRAST [CG]    Clinical Course User Index [CG] Kinnie Feil, PA-C    61 year old female presents to the emergency department for evaluation of acute worsening of chronic back pain.  She was seen yesterday for similar symptoms and returns after recurrence of her pain.  She is neurovascularly intact and without red flags or signs concerning for cauda equina.  She is actively followed by Dr. Ellene Route of neurosurgery.  She has a history of multiple back surgeries.  Pain control in the emergency department includes treatment with Toradol, Dilaudid, Decadron, Robaxin.  Patient has had improvement in her pain with this regimen.  She is now sleeping, probably on repeat assessment.  I did have a long discussion with the patient and her family.  Family expressed significant concern over severity of the patient's pain.  Despite reassurance, family and patient would feel most comfortable with imaging to ensure no acute or urgent changes.  I feel x-ray and CT would be low yield.  Patient held in the ED for MRI in the morning.  Patient signed out to Carmon Sails, PA-C at change of shift who will follow up on MRI.  If no acute or emergent process compared to baseline, I believe outpatient neurosurgical follow-up is  indicated with short-term plan for pain control.  Patient already aware of need for referral to pain management as this is an ongoing issue.  I have discussed the waxing and waning nature that often accompanies her chronic back pain.  Patient verbalizes understanding.   Final Clinical Impressions(s) / ED Diagnoses   Final diagnoses:  Chronic thoracic back pain, unspecified back pain laterality    ED Discharge Orders    None       Antonietta Breach, PA-C 10/09/17 9509    Veryl Speak, MD 10/09/17 3267    Antonietta Breach, PA-C 10/09/17 Willette Brace, MD 10/10/17 [phone]

## 2017-10-09 NOTE — ED Notes (Signed)
Pt reports nausea 

## 2017-10-09 NOTE — ED Notes (Signed)
Contact info: [name] (sister): [phone] Thayer Jew (sisters husband): [phone]

## 2017-10-09 NOTE — ED Provider Notes (Signed)
Patient handed off to me by previous ED PA Humes at shift change pending MRI of thoracic spine. Plan is to discharge up MRI is without emergent process with maximization of pain control. Please see previous EDP a note for full history, ROS and physical exam. Briefly, patient is a 61 year old female with chronic back pain on chronic opioids status post multiple back surgeries and followed by neurosurgeon, Dr. Danton Clap later. She is in the ED for acute on chronic thoracic back pain worsening over the last 48 days after feeling a "popping" sensation. No red flag symptoms.  On my exam, she has diffuse tenderness to T-spine w/o step offs. VS WNL.   MRI T-spine shows T5-6 protrusion with contact of ventral cord w/ mild remodeling but no emergent processes. Discussed work up with patient and recommended discharge. She is already on steroids, relaxers, oxycodone. Will encourage lidoderm patches and urgent f/u with Dr. Ellene Route for further discussion of pain control and management. Pt verbalized understanding and is agreeable. Discussed return precautions.    Kinnie Feil, PA-C 10/09/17 0350    Veryl Speak, MD 10/10/17 [phone]

## 2017-10-09 NOTE — Discharge Instructions (Addendum)
Your lab work looked ok. As we discussed your thoracic MRI showed no acute emergent processes. You have a T5-6 disc protrusion that is touching your central cord. This may be what is causing your pain.  Continue your home regimen including steroids, oxycodone, tylenol, ibuprofen. You can purchase over the counter lidocaine patches 20%. Follow up with Dr. Ellene Route as soon as possible for further discussion of MRI findings and discussion of further care. Follow up with your primary care doctor for medication refills and better pain management.  MRI impression. See attached for full findings. IMPRESSION:  1. No acute finding.  2. Diffuse thoracic spondylosis with multi-level ankylosis.  3. Multiple small and noncompressive disc protrusions listed above.  A T5-6 protrusion contacts the ventral cord with mild remodeling. No  foraminal impingement.

## 2017-10-11 ENCOUNTER — Other Ambulatory Visit: Payer: Self-pay

## 2017-10-11 ENCOUNTER — Emergency Department (HOSPITAL_COMMUNITY)
Admission: EM | Admit: 2017-10-11 | Discharge: 2017-10-11 | Disposition: A | Discharge status: Home / Self Care | Payer: Medicare Other | Attending: Emergency Medicine | Admitting: Emergency Medicine

## 2017-10-11 ENCOUNTER — Encounter (HOSPITAL_COMMUNITY): Payer: Self-pay | Admitting: *Deleted

## 2017-10-11 ENCOUNTER — Emergency Department (HOSPITAL_COMMUNITY): Payer: Medicare Other

## 2017-10-11 DIAGNOSIS — Z7901 Long term (current) use of anticoagulants: Secondary | ICD-10-CM | POA: Insufficient documentation

## 2017-10-11 DIAGNOSIS — Z7982 Long term (current) use of aspirin: Secondary | ICD-10-CM | POA: Insufficient documentation

## 2017-10-11 DIAGNOSIS — G8929 Other chronic pain: Secondary | ICD-10-CM | POA: Diagnosis not present

## 2017-10-11 DIAGNOSIS — J45909 Unspecified asthma, uncomplicated: Secondary | ICD-10-CM | POA: Diagnosis not present

## 2017-10-11 DIAGNOSIS — M5124 Other intervertebral disc displacement, thoracic region: Secondary | ICD-10-CM | POA: Diagnosis not present

## 2017-10-11 DIAGNOSIS — Z79899 Other long term (current) drug therapy: Secondary | ICD-10-CM | POA: Insufficient documentation

## 2017-10-11 DIAGNOSIS — J449 Chronic obstructive pulmonary disease, unspecified: Secondary | ICD-10-CM | POA: Diagnosis not present

## 2017-10-11 DIAGNOSIS — M546 Pain in thoracic spine: Secondary | ICD-10-CM

## 2017-10-11 DIAGNOSIS — I10 Essential (primary) hypertension: Secondary | ICD-10-CM | POA: Diagnosis not present

## 2017-10-11 DIAGNOSIS — Z91040 Latex allergy status: Secondary | ICD-10-CM | POA: Insufficient documentation

## 2017-10-11 DIAGNOSIS — Z96651 Presence of right artificial knee joint: Secondary | ICD-10-CM | POA: Diagnosis not present

## 2017-10-11 DIAGNOSIS — R51 Headache: Secondary | ICD-10-CM | POA: Diagnosis not present

## 2017-10-11 LAB — CBC WITH DIFFERENTIAL/PLATELET
BASOS PCT: 0 %
Basophils Absolute: 0 10*3/uL (ref 0.0–0.1)
EOS PCT: 1 %
Eosinophils Absolute: 0.1 10*3/uL (ref 0.0–0.7)
HEMATOCRIT: 39.8 % (ref 36.0–46.0)
HEMOGLOBIN: 11.8 g/dL — AB (ref 12.0–15.0)
LYMPHS PCT: 35 %
Lymphs Abs: 4.6 10*3/uL — ABNORMAL HIGH (ref 0.7–4.0)
MCH: 21.5 pg — AB (ref 26.0–34.0)
MCHC: 29.6 g/dL — ABNORMAL LOW (ref 30.0–36.0)
MCV: 72.5 fL — AB (ref 78.0–100.0)
MONOS PCT: 9 %
Monocytes Absolute: 1.2 10*3/uL — ABNORMAL HIGH (ref 0.1–1.0)
NEUTROS PCT: 55 %
Neutro Abs: 7.1 10*3/uL (ref 1.7–7.7)
PLATELETS: 421 10*3/uL — AB (ref 150–400)
RBC: 5.49 MIL/uL — AB (ref 3.87–5.11)
RDW: 18.3 % — ABNORMAL HIGH (ref 11.5–15.5)
WBC: 13 10*3/uL — AB (ref 4.0–10.5)

## 2017-10-11 LAB — BASIC METABOLIC PANEL
Anion gap: 12 (ref 5–15)
BUN: 14 mg/dL (ref 6–20)
CHLORIDE: 95 mmol/L — AB (ref 101–111)
CO2: 23 mmol/L (ref 22–32)
CREATININE: 0.8 mg/dL (ref 0.44–1.00)
Calcium: 9 mg/dL (ref 8.9–10.3)
GFR calc non Af Amer: >60 mL/min (ref >60)
Glucose, Bld: 114 mg/dL — ABNORMAL HIGH (ref 65–99)
POTASSIUM: 3.4 mmol/L — AB (ref 3.5–5.1)
SODIUM: 130 mmol/L — AB (ref 135–145)

## 2017-10-11 LAB — PROTIME-INR
INR: 2.4
PROTHROMBIN TIME: 26 s — AB (ref 11.4–15.2)

## 2017-10-11 LAB — APTT: aPTT: 31 seconds (ref 24–36)

## 2017-10-11 MED ORDER — MORPHINE SULFATE (PF) 4 MG/ML IV SOLN
4.0000 mg | Freq: Once | INTRAVENOUS | Status: AC
  Administered 2017-10-11: 4 mg via INTRAVENOUS
  Filled 2017-10-11: qty 1

## 2017-10-11 MED ORDER — ONDANSETRON HCL 4 MG PO TABS: 4.0000 mg | ORAL_TABLET | Freq: Three times a day (TID) | ORAL | 0 refills | Status: DC | PRN

## 2017-10-11 MED ORDER — METHYLPREDNISOLONE 4 MG PO TBPK: ORAL_TABLET | ORAL | 0 refills | Status: DC

## 2017-10-11 MED ORDER — ONDANSETRON HCL 4 MG/2ML IJ SOLN
4.0000 mg | Freq: Once | INTRAMUSCULAR | Status: AC
  Administered 2017-10-11: 4 mg via INTRAVENOUS
  Filled 2017-10-11: qty 2

## 2017-10-11 MED ORDER — OXYCODONE HCL 10 MG PO TABS: 10.0000 mg | ORAL_TABLET | Freq: Four times a day (QID) | ORAL | 0 refills | Status: DC | PRN

## 2017-10-11 NOTE — ED Notes (Signed)
Pt put on [place] @ 2LPM

## 2017-10-11 NOTE — ED Provider Notes (Signed)
[facility] EMERGENCY DEPARTMENT Provider Note   CSN: [id number] Arrival date & time: 10/11/17  1239     History   Chief Complaint Chief Complaint  Patient presents with  . Back Pain    HPI [name] is a 61 y.o. female with a history of chronic pain, COPD, dyslipidemia, hypertension who presents to the emergency department today for back pain and headache.  Patient here with sister who is a ED Network engineer.  Sister reports that the patient was in triage just a few minutes ago when she said she felt a popping sensation in her head followed by a blank stare, severe headache, slurring of her speech and blurring of her vision.  Patient is now complaining of a severe headache in the back of her head along with photophobia.  Patient has extensive back history.  She has had 2 fusions of the lower back and laser surgery done to the mid back as well per her note. Last surgery in August. She is currently followed by neurosurgeon, Dr. Ellene Route who is treating her pain.  Patient was seen here on 10/09/2017 where she had a MRI of the back done that showed T5-6 protrusion with contact of the vertebral cords with mild remodeling but no emergent process.  The patient was already on steroids, muscle relaxers, oxycodone.  She was discharged with lidocaine patches and given a referral to Dr. Ellene Route for follow-up and further discussion of pain control.  She reports that she has called Dr. Clarice Pole office on several occasions but is not been able to get into his have an appointment yet.  Patient states even with pain regimen that he pain is unbearable. She has not gotten out of bed in the last few days due to the pain. Patient denies any numbness/tingling/weakness of her extremities, saddle anesthesia, new or worsening bowel/bladder incontinence, urinary retention, changes in gait, fever, history of cancer, or unexplained weight loss. She is on chronic abx for uti but no new urinary symptoms.    HPI  Past Medical History:  Diagnosis Date  . Acid reflux   . Asthma   . Chronic pain   . COPD (chronic obstructive pulmonary disease) ([place])   . Diverticulitis   . Heart murmur   . Hiatal hernia   . High cholesterol   . History of UTI   . Hypertension   . Labral tear of shoulder    left  . Myocardial infarction ([place]) 2014  . Pneumonia 2014  . Pulmonary embolism ([place]) last 2014   x 3   . Ruptured disc, cervical    1 in neck, 1 in midback and 1 in low back, 6 bulging discs,  upper and lower back, 5 slipped discs all the way uop and down back and   . Sleep apnea    uses cpap, pt does not know settings    Patient Active Problem List   Diagnosis Date Noted  . Lumbar radiculopathy, chronic   . Essential hypertension   . Lumbar back pain with radiculopathy affecting right lower extremity 06/05/2017  . Palliative care by specialist   . Right leg pain 06/02/2017  . Severe low back pain 06/02/2017  . Acute right-sided low back pain with sciatica   . Chronic pulmonary embolism ([place]) 04/17/2013  . UTI (urinary tract infection) 04/17/2013  . Diverticulitis of sigmoid colon 04/16/2013  . Obesity, Class III, BMI 40-49.9 (morbid obesity) ([place]) 04/16/2013  . Chronic narcotic dependence ([place]) 04/16/2013  . Asthma 11/16/2011  .  GERD (gastroesophageal reflux disease) 11/16/2011  . Hyperlipidemia 11/16/2011    Past Surgical History:  Procedure Laterality Date  .  bone spurs removed from feet Bilateral   . ABDOMINAL HYSTERECTOMY    . BACK SURGERY     fusion x 2 lower back, laser surgery mid and back and neck also done  . CARPAL TUNNEL RELEASE Right   . CHOLECYSTECTOMY    . COLONOSCOPY WITH PROPOFOL N/A 04/08/2016   Procedure: COLONOSCOPY WITH PROPOFOL;  Surgeon: Carol Ada, MD;  Location: [facility] ENDOSCOPY;  Service: Endoscopy;  Laterality: N/A;  . colonscopy     x 2, with polyps removed both times  . IR FL GUIDED LOC OF NEEDLE/CATH TIP FOR SPINAL INJECTION RT  06/04/2017  . JOINT  REPLACEMENT    . REPLACEMENT TOTAL KNEE Right   . TUBAL LIGATION      OB History    No data available       Home Medications    Prior to Admission medications   Medication Sig Start Date End Date Taking? Authorizing Provider  albuterol (PROVENTIL HFA;VENTOLIN HFA) 108 (90 BASE) MCG/ACT inhaler Inhale 2 puffs into the lungs every 6 (six) hours as needed. For wheezing 02/28/13   Janece Canterbury, MD  aspirin EC 81 MG tablet Take 81 mg by mouth daily at 12 noon.     [provider]  diazepam (VALIUM) 10 MG tablet Take 10 mg by mouth every 8 (eight) hours as needed for anxiety.     [provider]  diclofenac sodium (VOLTAREN) 1 % GEL Apply 2 g topically 4 (four) times daily as needed (For pain in shoulder, knees, back and neck.).     [provider]  docusate sodium (COLACE) 100 MG capsule Take 1 capsule (100 mg total) by mouth 2 (two) times daily. Patient taking differently: Take 100 mg by mouth 2 (two) times daily as needed for mild constipation.  06/16/17   Barton Dubois, MD  estradiol (ESTRACE) 0.1 MG/GM vaginal cream Place 1 Applicatorful vaginally at bedtime.    [provider]  furosemide (LASIX) 40 MG tablet Take 40 mg by mouth 2 (two) times daily as needed for fluid. For fluid retention.    [provider]  lidocaine (LIDODERM) 5 % Place 1-3 patches onto the skin daily. Remove & Discard patch within 12 hours or as directed by MD    [provider]  losartan (COZAAR) 50 MG tablet Take 50 mg by mouth daily at 12 noon.     [provider]  metoprolol succinate (TOPROL-XL) 50 MG 24 hr tablet Take 50 mg by mouth daily. Take with or immediately following a meal.     [provider]  nitroGLYCERIN (NITROSTAT) 0.4 MG SL tablet Place 0.4 mg under the tongue every 5 (five) minutes as needed for chest pain.    [provider]  nystatin [facility]) powder Apply 1 g topically daily as needed (rash).    [provider]  omeprazole (PRILOSEC) 40 MG capsule Take 40 mg by mouth 2 (two) times daily.     [provider]  Oxycodone HCl 10 MG TABS Take 1 tablet (10 mg total) by mouth every 6 (six) hours as needed (for severe pain). 10/08/17   Carmin Muskrat, MD  pravastatin (PRAVACHOL) 40 MG tablet Take 40 mg by mouth daily at 12 noon.     [provider]  predniSONE (DELTASONE) 20 MG tablet Take 2 tablets (40 mg total) by mouth daily with  breakfast. For the next four days 10/08/17   Carmin Muskrat, MD  trimethoprim (TRIMPEX) 100 MG tablet Take 100 mg by mouth daily.    [provider]  Vitamin D, Ergocalciferol, (DRISDOL) 50000 units CAPS capsule Take 50,000 Units by mouth every 7 (seven) days.    [provider]  warfarin (COUMADIN) 5 MG tablet Take 2.5-5 mg by mouth See admin instructions. Take one tablet (5mg ) on Monday, Wednesday, Friday and half a tablet (2.5mg ) the rest of the week. 07/20/15   [provider]    Family History Family History  Problem Relation Age of Onset  . Heart failure Mother   . COPD Mother   . Heart failure Father   . COPD Father   . Cancer Other   . Heart failure Other     Social History Social History   Tobacco Use  . Smoking status: Never Smoker  . Smokeless tobacco: Never Used  Substance Use Topics  . Alcohol use: No  . Drug use: No     Allergies   Latex; Other; Shellfish allergy; and Tape   Review of Systems Review of Systems  All other systems reviewed and are negative.    Physical Exam Updated Vital Signs BP (!) 205/90   Pulse 79   Temp 97.8 F (36.6 C) (Oral)   Resp (!) 23   Ht 5\' 3"  (1.6 m)   Wt 122.5 kg (270 lb)   SpO2 99%   BMI 47.83 kg/m   Physical Exam  Constitutional: She appears well-developed and well-nourished. No distress.  Non-toxic appearing  HENT:  Head: Normocephalic and atraumatic.  Right Ear: External ear normal.  Left Ear: External ear normal.  Neck: Normal  range of motion. Neck supple. No spinous process tenderness present. No neck rigidity. Normal range of motion present.  Cardiovascular: Normal rate, regular rhythm, normal heart sounds and intact distal pulses.  No murmur heard. Pulses:      Radial pulses are 2+ on the right side, and 2+ on the left side.       Femoral pulses are 2+ on the right side, and 2+ on the left side.      Dorsalis pedis pulses are 2+ on the right side, and 2+ on the left side.       Posterior tibial pulses are 2+ on the right side, and 2+ on the left side.  Pulmonary/Chest: Effort normal and breath sounds normal. No respiratory distress.  Abdominal: Soft. Bowel sounds are normal. She exhibits no pulsatile midline mass. There is no tenderness. There is no rigidity, no rebound and no CVA tenderness.  Musculoskeletal:  Patient hunched over in pain. Possible kyphosis. No evidence of obvious scoliosis. No obvious signs of skin changes, trauma, deformity, infection. There is diffuse thoracic and lumbar ttp. No step offs noted.  Lung expansion normal. Bilateral lower extremity strength appropriate and symmetric. Patellar and Achilles deep tendon reflex 2+ and equal bilaterally. Sensation of lower extremities grossly intact. Straight leg right neg. Straight leg left neg. Gait able with walker but very slow and patient must take break every 2-3 steps to rest due to pain. Lower extremity compartments soft. PT and DP 2+ b/l. Cap refill <2 seconds.   Neurological: She is alert.  Speech clear. Follows commands. No facial droop. PERRLA. EOM grossly intact. CN III-XII grossly intact. Grossly moves all extremities 4 without ataxia. Able and appropriate strength for age to upper and lower extremities bilaterally including grip strength.  No pronator drift.  Finger to nose intact bilaterally.  Gait able with walker.  Skin: Skin is warm, dry and intact. Capillary refill takes less than 2 seconds. No rash noted. She is not diaphoretic. No  erythema.  Nursing note and vitals reviewed.    ED Treatments / Results  Labs (all labs ordered are listed, but only abnormal results are displayed) Labs Reviewed  PROTIME-INR - Abnormal; Notable for the following components:      Result Value   Prothrombin Time 26.0 (*)    All other components within normal limits  BASIC METABOLIC PANEL - Abnormal; Notable for the following components:   Sodium 130 (*)    Potassium 3.4 (*)    Chloride 95 (*)    Glucose, Bld 114 (*)    All other components within normal limits  CBC WITH DIFFERENTIAL/PLATELET - Abnormal; Notable for the following components:   WBC 13.0 (*)    RBC 5.49 (*)    Hemoglobin 11.8 (*)    MCV 72.5 (*)    MCH 21.5 (*)    MCHC 29.6 (*)    RDW 18.3 (*)    Platelets 421 (*)    Lymphs Abs 4.6 (*)    Monocytes Absolute 1.2 (*)    All other components within normal limits  APTT    EKG  EKG Interpretation None       Radiology Ct Head Wo Contrast  Result Date: 10/11/2017 CLINICAL DATA:  Severe headache. EXAM: CT HEAD WITHOUT CONTRAST TECHNIQUE: Contiguous axial images were obtained from the base of the skull through the vertex without intravenous contrast. COMPARISON:  03/14/2013 FINDINGS: Brain: No acute intracranial abnormality. Specifically, no hemorrhage, hydrocephalus, mass lesion, acute infarction, or significant intracranial injury. Vascular: No hyperdense vessel or unexpected calcification. Skull: No acute calvarial abnormality. Sinuses/Orbits: Visualized paranasal sinuses and mastoids clear. Orbital soft tissues unremarkable. Other: None IMPRESSION: No acute intracranial abnormality. Electronically Signed   By: Rolm Baptise M.D.   On: 10/11/2017 14:06    Procedures Procedures (including critical care time)  Medications Ordered in ED Medications  morphine 4 MG/ML injection 4 mg (4 mg Intravenous Given 10/11/17 1331)  morphine 4 MG/ML injection 4 mg (4 mg Intravenous Given 10/11/17 1558)  ondansetron  (ZOFRAN) injection 4 mg (4 mg Intravenous Given 10/11/17 1547)     Initial Impression / Assessment and Plan / ED Course  I have reviewed the triage vital signs and the nursing notes.  Pertinent labs & imaging results that were available during my care of the patient were reviewed by me and considered in my medical decision making (see chart for details).     61 year old female here with headache and back pain.  Headache occurred acutely when in triage.  She reported feeling a popping sensation in her head followed by a blank stare, severe headache blurring of her vision.  CT of the head was negative for subarachnoid or other abnormality.  No focal neurologic deficits on exam.  Her headache has resolved while in the department.   Patient with extensive back history. Patient recent underwent L5-S1 decompression with posterior lumbar interbody fusion on 06/09/2017. She is currently followed by neurosurgeon, Dr. Ellene Route who is treating her pain.  Patient was seen here on 10/09/2017 where she had a MRI of the back done that showed T5-6 protrusion with contact of the vertebral cords with mild remodeling but no emergent process.  The patient was already on steroids, muscle relaxers, oxycodone.  She was discharged with lidocaine patches and given a referral  to Dr. Ellene Route for follow-up and further discussion of pain control.  She reports that she has called Dr. Clarice Pole office on several occasions but is not been able to get into his have an appointment yet.  Has recently ran out of steroids and pain medication.  She says her pain is unbearable.  Patient denies any numbness/tingling/weakness of her extremities, saddle anesthesia, new or worsening bowel/bladder incontinence, urinary retention, changes in gait (walks with walker at baseline), fever, history of cancer, or unexplained weight loss.  Vital signs are without fever.  Patient without any focal neurologic deficits on exam.  No pulsatile mass of the  abdomen.  She is able to walk with assistance of a walker but very slowly and notes it is very painful.  Her pain has only improved to a 7/10 with morphine.  As the patient has made several visits to the emergency department for same pain will attempt to admit for pain control.   Dr. Dyann Kief tried hospitalist does not feel patient would benefit from admission for pain control. Asked to consult neurosurgery for recommendation. Will place consult.   Kim of neurosurgery recommends refill of pain medication, a medrol dose pack and close follow up with Dr. Ellene Route so they can decide the next step in treatment.    Patient reviewed in [place] Controlled Substance Reporting System. Will refill short supply of her pain medication. Will give Medrol Dose pack. Patient is able to ambulate in the ED at baseline. Discussed with patients sister and she is in agreement with plan. Urged follow up to neurosurgery office. Patient given strict return precautions. She appears safe for discharge.   Patient case seen and discussed with Dr. Johnney Killian who is in agreement with plan.   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

## 2017-10-11 NOTE — ED Notes (Signed)
Pt ambulated in hallway x2 assist and walker to the restroom.  Tolerated fairly.  Pt reports pain from neck down her entire spine.  She reports "its normally painful in my mid to lower back but the past few days my neck and upper back have been hurting really bad."

## 2017-10-11 NOTE — ED Triage Notes (Addendum)
Pt states her back is killing her and she was seen here recently.  Pt is moaning and in tears.  Pt had an MRI done at [facility] and they were told something is pressing on her nerves. Pain is mid back up through neck.

## 2017-10-11 NOTE — Discharge Instructions (Signed)
You were seen here today for back pain.  Unfortunately we were unable to admit you to the hospitalist service for pain control.    For breakthrough pain you may take Oxycodone. Do not drink alcohol drive or operate heavy machinery when taking. You are being provided a prescription for opiates (also known as narcotics) for pain control on an ?as needed? basis.  Opiates can be addictive and should only be used when absolutely necessary for pain control when other alternatives do not work.  We recommend you only use them for the recommended amount of time and only as prescribed.  Please do not take with other sedative medications or alcohol.  Please do not drive, operate machinery, or make important decisions while taking opiates.  Please note that these medications can be addictive and have high abuse potential.  Please keep these medications locked away from children, teenagers or any family members with history of substance abuse. Additionally, these medications may cause constipation - take over the counter stool softeners or add fiber to your diet to treat this (Metamucil, Psyllium Fiber, Colace, Miralax) Further refills will need to be obtained from your primary care doctor and will not be prescribed through the Emergency Department. You will test positive on most drug tests while taking this medication.   I have also prescribed you a medrol dose pack. This is a steroid. Please take as directed.   Please follow-up with your neurosurgeon as soon as possible.  Be aware that if you develop new symptoms, such as a fever, leg weakness, difficulty with or loss of control of your urine or bowels, abdominal pain, or more severe pain, you will need to seek medical attention and/or return to the Emergency department. Additional Information:  Your vital signs today were: BP (!) 205/90    Pulse 79    Temp 97.8 F (36.6 C) (Oral)    Resp (!) 23    Ht 5\' 3"  (1.6 m)    Wt 122.5 kg (270 lb)    SpO2 99%    BMI 47.83  kg/m  If your blood pressure (BP) was elevated above 135/85 this visit, please have this repeated by your doctor within one month. ---------------

## 2017-10-11 NOTE — ED Notes (Signed)
Pt and sisiter verbalized understanding of DC and F/U instructions.  Unable to e-sign-hallway pt.  VSS.  To lobby via [place].  There are no further needs

## 2017-10-15 DIAGNOSIS — M5416 Radiculopathy, lumbar region: Secondary | ICD-10-CM | POA: Diagnosis not present

## 2017-10-18 ENCOUNTER — Emergency Department (HOSPITAL_COMMUNITY)
Admission: EM | Admit: 2017-10-18 | Discharge: 2017-10-18 | Disposition: A | Discharge status: Home / Self Care | Payer: Medicare Other | Attending: Emergency Medicine | Admitting: Emergency Medicine

## 2017-10-18 ENCOUNTER — Encounter (HOSPITAL_COMMUNITY): Payer: Self-pay | Admitting: *Deleted

## 2017-10-18 ENCOUNTER — Emergency Department (HOSPITAL_COMMUNITY): Payer: Medicare Other

## 2017-10-18 ENCOUNTER — Other Ambulatory Visit: Payer: Self-pay

## 2017-10-18 DIAGNOSIS — K5792 Diverticulitis of intestine, part unspecified, without perforation or abscess without bleeding: Secondary | ICD-10-CM

## 2017-10-18 DIAGNOSIS — J45909 Unspecified asthma, uncomplicated: Secondary | ICD-10-CM | POA: Diagnosis not present

## 2017-10-18 DIAGNOSIS — K5903 Drug induced constipation: Secondary | ICD-10-CM | POA: Diagnosis not present

## 2017-10-18 DIAGNOSIS — T402X5A Adverse effect of other opioids, initial encounter: Secondary | ICD-10-CM | POA: Diagnosis not present

## 2017-10-18 DIAGNOSIS — Z79899 Other long term (current) drug therapy: Secondary | ICD-10-CM | POA: Insufficient documentation

## 2017-10-18 DIAGNOSIS — I10 Essential (primary) hypertension: Secondary | ICD-10-CM | POA: Diagnosis not present

## 2017-10-18 DIAGNOSIS — M546 Pain in thoracic spine: Secondary | ICD-10-CM | POA: Diagnosis not present

## 2017-10-18 DIAGNOSIS — Y658 Other specified misadventures during surgical and medical care: Secondary | ICD-10-CM | POA: Insufficient documentation

## 2017-10-18 DIAGNOSIS — T887XXA Unspecified adverse effect of drug or medicament, initial encounter: Secondary | ICD-10-CM | POA: Insufficient documentation

## 2017-10-18 DIAGNOSIS — G8929 Other chronic pain: Secondary | ICD-10-CM | POA: Diagnosis not present

## 2017-10-18 DIAGNOSIS — R079 Chest pain, unspecified: Secondary | ICD-10-CM | POA: Diagnosis not present

## 2017-10-18 DIAGNOSIS — R0602 Shortness of breath: Secondary | ICD-10-CM | POA: Diagnosis not present

## 2017-10-18 DIAGNOSIS — J449 Chronic obstructive pulmonary disease, unspecified: Secondary | ICD-10-CM | POA: Insufficient documentation

## 2017-10-18 DIAGNOSIS — R1084 Generalized abdominal pain: Secondary | ICD-10-CM | POA: Diagnosis present

## 2017-10-18 DIAGNOSIS — R0789 Other chest pain: Secondary | ICD-10-CM

## 2017-10-18 DIAGNOSIS — K760 Fatty (change of) liver, not elsewhere classified: Secondary | ICD-10-CM | POA: Diagnosis not present

## 2017-10-18 DIAGNOSIS — K5712 Diverticulitis of small intestine without perforation or abscess without bleeding: Secondary | ICD-10-CM | POA: Diagnosis not present

## 2017-10-18 LAB — LIPASE, BLOOD: Lipase: 16 U/L (ref 11–51)

## 2017-10-18 LAB — COMPREHENSIVE METABOLIC PANEL
ALK PHOS: 120 U/L (ref 38–126)
ALT: 22 U/L (ref 14–54)
ANION GAP: 15 (ref 5–15)
AST: 26 U/L (ref 15–41)
Albumin: 3.7 g/dL (ref 3.5–5.0)
BILIRUBIN TOTAL: 0.8 mg/dL (ref 0.3–1.2)
BUN: 19 mg/dL (ref 6–20)
CALCIUM: 9.2 mg/dL (ref 8.9–10.3)
CO2: 24 mmol/L (ref 22–32)
Chloride: 97 mmol/L — ABNORMAL LOW (ref 101–111)
Creatinine, Ser: 0.94 mg/dL (ref 0.44–1.00)
Glucose, Bld: 124 mg/dL — ABNORMAL HIGH (ref 65–99)
Potassium: 3.8 mmol/L (ref 3.5–5.1)
Sodium: 136 mmol/L (ref 135–145)
TOTAL PROTEIN: 7.8 g/dL (ref 6.5–8.1)

## 2017-10-18 LAB — URINALYSIS, ROUTINE W REFLEX MICROSCOPIC
Bilirubin Urine: NEGATIVE
GLUCOSE, UA: NEGATIVE mg/dL
Hgb urine dipstick: NEGATIVE
KETONES UR: NEGATIVE mg/dL
NITRITE: NEGATIVE
PH: 5 (ref 5.0–8.0)
Protein, ur: NEGATIVE mg/dL
Specific Gravity, Urine: 1.025 (ref 1.005–1.030)

## 2017-10-18 LAB — CBC
HCT: 45.5 % (ref 36.0–46.0)
HEMOGLOBIN: 13.6 g/dL (ref 12.0–15.0)
MCH: 21.5 pg — ABNORMAL LOW (ref 26.0–34.0)
MCHC: 29.9 g/dL — AB (ref 30.0–36.0)
MCV: 71.8 fL — ABNORMAL LOW (ref 78.0–100.0)
Platelets: 446 10*3/uL — ABNORMAL HIGH (ref 150–400)
RBC: 6.34 MIL/uL — AB (ref 3.87–5.11)
RDW: 19.4 % — ABNORMAL HIGH (ref 11.5–15.5)
WBC: 24.1 10*3/uL — AB (ref 4.0–10.5)

## 2017-10-18 LAB — I-STAT TROPONIN, ED
Troponin i, poc: 0 ng/mL (ref 0.00–0.08)
Troponin i, poc: 0 ng/mL (ref 0.00–0.08)

## 2017-10-18 LAB — DIFFERENTIAL
BASOS PCT: 0 %
Basophils Absolute: 0.1 10*3/uL (ref 0.0–0.1)
EOS ABS: 0.3 10*3/uL (ref 0.0–0.7)
Eosinophils Relative: 1 %
Lymphocytes Relative: 19 %
Lymphs Abs: 4.8 10*3/uL — ABNORMAL HIGH (ref 0.7–4.0)
MONOS PCT: 7 %
Monocytes Absolute: 1.9 10*3/uL — ABNORMAL HIGH (ref 0.1–1.0)
Neutro Abs: 18.2 10*3/uL — ABNORMAL HIGH (ref 1.7–7.7)
Neutrophils Relative %: 73 %

## 2017-10-18 MED ORDER — ONDANSETRON HCL 4 MG/2ML IJ SOLN
4.0000 mg | Freq: Once | INTRAMUSCULAR | Status: AC
  Administered 2017-10-18: 4 mg via INTRAVENOUS
  Filled 2017-10-18: qty 2

## 2017-10-18 MED ORDER — AMOXICILLIN-POT CLAVULANATE 875-125 MG PO TABS: 1.0000 | ORAL_TABLET | Freq: Two times a day (BID) | ORAL | 0 refills | Status: DC

## 2017-10-18 MED ORDER — HYDROMORPHONE HCL 1 MG/ML IJ SOLN
1.0000 mg | Freq: Once | INTRAMUSCULAR | Status: AC
  Administered 2017-10-18: 1 mg via INTRAVENOUS
  Filled 2017-10-18: qty 1

## 2017-10-18 MED ORDER — IOPAMIDOL (ISOVUE-300) INJECTION 61%
INTRAVENOUS | Status: AC
  Administered 2017-10-18: 100 mL
  Filled 2017-10-18: qty 100

## 2017-10-18 MED ORDER — LACTULOSE 10 GM/15ML PO SOLN: 10.0000 g | Freq: Two times a day (BID) | ORAL | 0 refills | Status: DC

## 2017-10-18 MED ORDER — CIPROFLOXACIN IN D5W 400 MG/200ML IV SOLN
400.0000 mg | Freq: Once | INTRAVENOUS | Status: AC
  Administered 2017-10-18: 400 mg via INTRAVENOUS
  Filled 2017-10-18: qty 200

## 2017-10-18 MED ORDER — SODIUM CHLORIDE 0.9 % IV BOLUS (SEPSIS)
1000.0000 mL | Freq: Once | INTRAVENOUS | Status: AC
  Administered 2017-10-18: 1000 mL via INTRAVENOUS

## 2017-10-18 MED ORDER — METRONIDAZOLE IN NACL 5-0.79 MG/ML-% IV SOLN
500.0000 mg | Freq: Once | INTRAVENOUS | Status: AC
  Administered 2017-10-18: 500 mg via INTRAVENOUS
  Filled 2017-10-18: qty 100

## 2017-10-18 MED ORDER — MORPHINE SULFATE (PF) 4 MG/ML IV SOLN: 4.0000 mg | Freq: Once | INTRAVENOUS | Status: DC

## 2017-10-18 MED ORDER — KETOROLAC TROMETHAMINE 30 MG/ML IJ SOLN
30.0000 mg | Freq: Once | INTRAMUSCULAR | Status: AC
  Administered 2017-10-18: 30 mg via INTRAVENOUS
  Filled 2017-10-18: qty 1

## 2017-10-18 MED ORDER — FENTANYL CITRATE (PF) 100 MCG/2ML IJ SOLN
50.0000 ug | Freq: Once | INTRAMUSCULAR | Status: AC
  Administered 2017-10-18: 50 ug via INTRAVENOUS
  Filled 2017-10-18: qty 2

## 2017-10-18 MED ORDER — DOCUSATE SODIUM 100 MG PO CAPS: 100.0000 mg | ORAL_CAPSULE | Freq: Two times a day (BID) | ORAL | 0 refills | Status: DC

## 2017-10-18 NOTE — Discharge Instructions (Signed)
Your CT shows heavy stool burden and possible early diverticulitis. I suspect your constipation is from chronic use of opioids.   Take augmentin as prescribed. Stay well hydrated.   Follow up with gastroenterology within 1 week.   Return to ED for fevers, vomiting, bloody diarrhea.

## 2017-10-18 NOTE — ED Notes (Signed)
Patient transported to CT 

## 2017-10-18 NOTE — ED Triage Notes (Signed)
Pt states she has been sick for several weeks with her back killing her.  No BM in about one week and has history of diverticulitis.  Took MOM Sunday and no results.  Mag citrate she took and no results.  On the way up to the hospital started having chest pains and reports hard to breath.  Pt reports nausea. Pt has been taking pain medications for her back.

## 2017-10-18 NOTE — ED Notes (Signed)
Called, no answer.

## 2017-10-18 NOTE — ED Notes (Signed)
ED Provider at bedside. 

## 2017-10-18 NOTE — ED Provider Notes (Signed)
[facility] EMERGENCY DEPARTMENT Provider Note   CSN: [id number] Arrival date & time: 10/18/17  1004     History   Chief Complaint Chief Complaint  Patient presents with  . Chest Pain  . Abdominal Pain    HPI [name] is a 61 y.o. female w/ h/o COPD, chronic pain on chronic opioids for chronic back pain s/p multiple surgeries, obesity, diverticulitis, MI presents to ED for evaluation of gradually worsening, constant, sharp, diffuse abdominal pain x 2 days associated with nausea, chills and constipation. She developed left sided sharp then pressure then achy chest pain on her way to ED for evaluation of abdominal pain, which has improved and associated with shortness of breath. CP non pleuritic and non exertional. Last BM was four days ago, smaller and harder.  Has tried mg citrate and stool softeners without relief of constipation. Has been on pain meds for many years but has recently increased dose in the last couple of weeks due to worsening back pain. Last PO was piece of pie yesterday.   Denies fevers, vomiting, dysuria, hematuria, melena, hematochezia, diarrhea, bladder incontinence or retention, numbness or weakness to legs. No falls.    HPI  Past Medical History:  Diagnosis Date  . Acid reflux   . Asthma   . Chronic pain   . COPD (chronic obstructive pulmonary disease) ([place])   . Diverticulitis   . Heart murmur   . Hiatal hernia   . High cholesterol   . History of UTI   . Hypertension   . Labral tear of shoulder    left  . Myocardial infarction ([place]) 2014  . Pneumonia 2014  . Pulmonary embolism ([place]) last 2014   x 3   . Ruptured disc, cervical    1 in neck, 1 in midback and 1 in low back, 6 bulging discs,  upper and lower back, 5 slipped discs all the way uop and down back and   . Sleep apnea    uses cpap, pt does not know settings    Patient Active Problem List   Diagnosis Date Noted  . Lumbar radiculopathy, chronic   . Essential  hypertension   . Lumbar back pain with radiculopathy affecting right lower extremity 06/05/2017  . Palliative care by specialist   . Right leg pain 06/02/2017  . Severe low back pain 06/02/2017  . Acute right-sided low back pain with sciatica   . Chronic pulmonary embolism ([place]) 04/17/2013  . UTI (urinary tract infection) 04/17/2013  . Diverticulitis of sigmoid colon 04/16/2013  . Obesity, Class III, BMI 40-49.9 (morbid obesity) ([place]) 04/16/2013  . Chronic narcotic dependence ([place]) 04/16/2013  . Asthma 11/16/2011  . GERD (gastroesophageal reflux disease) 11/16/2011  . Hyperlipidemia 11/16/2011    Past Surgical History:  Procedure Laterality Date  .  bone spurs removed from feet Bilateral   . ABDOMINAL HYSTERECTOMY    . BACK SURGERY     fusion x 2 lower back, laser surgery mid and back and neck also done  . CARPAL TUNNEL RELEASE Right   . CHOLECYSTECTOMY    . COLONOSCOPY WITH PROPOFOL N/A 04/08/2016   Procedure: COLONOSCOPY WITH PROPOFOL;  Surgeon: Carol Ada, MD;  Location: [facility] ENDOSCOPY;  Service: Endoscopy;  Laterality: N/A;  . colonscopy     x 2, with polyps removed both times  . IR FL GUIDED LOC OF NEEDLE/CATH TIP FOR SPINAL INJECTION RT  06/04/2017  . JOINT REPLACEMENT    . REPLACEMENT TOTAL KNEE Right   .  TUBAL LIGATION      OB History    No data available       Home Medications    Prior to Admission medications   Medication Sig Start Date End Date Taking? Authorizing Provider  albuterol (PROVENTIL HFA;VENTOLIN HFA) 108 (90 BASE) MCG/ACT inhaler Inhale 2 puffs into the lungs every 6 (six) hours as needed. For wheezing 02/28/13  Yes Short, Noah Delaine, MD  aspirin EC 81 MG tablet Take 81 mg by mouth daily at 12 noon.    Yes [provider]  diazepam (VALIUM) 10 MG tablet Take 10 mg by mouth every 8 (eight) hours as needed for anxiety.    Yes [provider]  diclofenac sodium (VOLTAREN) 1 % GEL Apply 2 g topically 4 (four) times daily as needed (For  pain in shoulder, knees, back and neck.).    Yes [provider]  docusate sodium (COLACE) 100 MG capsule Take 1 capsule (100 mg total) by mouth 2 (two) times daily. Patient taking differently: Take 100 mg by mouth 2 (two) times daily as needed for mild constipation.  06/16/17  Yes Barton Dubois, MD  furosemide (LASIX) 40 MG tablet Take 40 mg by mouth 2 (two) times daily as needed for fluid. For fluid retention.   Yes [provider]  Hypromellose (ARTIFICIAL TEARS OP) Place 1 drop into both eyes daily as needed (dry eyes).   Yes [provider]  lidocaine (LIDODERM) 5 % Place 1-3 patches onto the skin daily. Remove & Discard patch within 12 hours or as directed by MD   Yes [provider]  losartan (COZAAR) 50 MG tablet Take 50 mg by mouth daily as needed (blood pressure).    Yes [provider]  magnesium hydroxide (MILK OF MAGNESIA) 400 MG/5ML suspension Take 15 mLs by mouth daily as needed for mild constipation.   Yes [provider]  methocarbamol (ROBAXIN) 500 MG tablet Take 500 mg by mouth every 6 (six) hours.   Yes [provider]  metoprolol succinate (TOPROL-XL) 50 MG 24 hr tablet Take 50 mg by mouth daily. Take with or immediately following a meal.    Yes [provider]  nitroGLYCERIN (NITROSTAT) 0.4 MG SL tablet Place 0.4 mg under the tongue every 5 (five) minutes as needed for chest pain.   Yes [provider]  nystatin [facility]) powder Apply 1 g topically daily as needed (rash).   Yes [provider]  omeprazole (PRILOSEC) 40 MG capsule Take 40 mg by mouth 2 (two) times daily.    Yes [provider]  ondansetron (ZOFRAN) 4 MG tablet Take 1 tablet (4 mg total) by mouth every 8 (eight) hours as needed for nausea or vomiting. 10/11/17  Yes Maczis, Barth Kirks, PA-C  Oxycodone HCl 10 MG TABS Take 1 tablet (10 mg total) by mouth every 6 (six) hours as needed (for severe pain). 10/11/17  Yes Maczis,  Barth Kirks, PA-C  pravastatin (PRAVACHOL) 40 MG tablet Take 40 mg by mouth daily at 12 noon.    Yes [provider]  predniSONE (DELTASONE) 20 MG tablet Take 2 tablets (40 mg total) by mouth daily with breakfast. For the next four days 10/08/17  Yes Carmin Muskrat, MD  trimethoprim (TRIMPEX) 100 MG tablet Take 100 mg by mouth daily.   Yes [provider]  Vitamin D, Ergocalciferol, (DRISDOL) 50000 units CAPS capsule Take 50,000 Units by mouth every 7 (seven) days.   Yes [provider]  warfarin (COUMADIN) 5  MG tablet Take 2.5-5 mg by mouth See admin instructions. Take one tablet (5mg ) on Monday, Wednesday, Friday and half a tablet (2.5mg ) the rest of the week. 07/20/15  Yes [provider]  amoxicillin-clavulanate (AUGMENTIN) 875-125 MG tablet Take 1 tablet by mouth every 12 (twelve) hours. 10/18/17   Kinnie Feil, PA-C  estradiol (ESTRACE) 0.1 MG/GM vaginal cream Place 1 Applicatorful vaginally at bedtime.    [provider]  methylPREDNISolone (MEDROL DOSEPAK) 4 MG TBPK tablet Day 1: 8 mg PO before breakfast, 4 mg after lunch and after dinner, and 8 mg at bedtime  Day 2: 4 mg PO before breakfast, after lunch, and after dinner and 8 mg at bedtime  Day 3: 4 mg PO before breakfast, after lunch, after dinner, and at bedtime  Day 4: 4 mg PO before breakfast, after lunch, and at bedtime  Day 5: 4 mg PO before breakfast and at bedtime  Day 6: 4 mg PO before breakfast Patient not taking: Reported on 10/18/2017 10/11/17   Jillyn Ledger, PA-C    Family History Family History  Problem Relation Age of Onset  . Heart failure Mother   . COPD Mother   . Heart failure Father   . COPD Father   . Cancer Other   . Heart failure Other     Social History Social History   Tobacco Use  . Smoking status: Never Smoker  . Smokeless tobacco: Never Used  Substance Use Topics  . Alcohol use: No  . Drug use: No     Allergies   Latex; Other;  Shellfish allergy; and Tape   Review of Systems Review of Systems  Constitutional: Positive for appetite change and chills.  Respiratory: Positive for shortness of breath.   Cardiovascular: Positive for chest pain.  Gastrointestinal: Positive for abdominal pain, constipation and nausea.  Musculoskeletal: Positive for back pain (chronic).     Physical Exam Updated Vital Signs BP 117/79 (BP Location: Left Wrist)   Pulse 89   Temp 99.9 F (37.7 C) (Rectal)   Resp 15   Ht 5\' 3"  (1.6 m)   Wt 122 kg (269 lb)   SpO2 91%   BMI 47.65 kg/m   Physical Exam  Constitutional: She is oriented to person, place, and time. She appears well-developed and well-nourished. No distress.  Non toxic but appears uncomfortable. Obese.   HENT:  Head: Normocephalic and atraumatic.  Right Ear: External ear normal.  Left Ear: External ear normal.  Nose: Nose normal.  Moist mucous membranes.   Eyes: Conjunctivae and EOM are normal. No scleral icterus.  Neck: Normal range of motion. Neck supple.  Cardiovascular: Regular rhythm and normal heart sounds. Tachycardia present.  No murmur heard. 2+ radial and DP pulses bilaterally. No calf edema or tenderness   Pulmonary/Chest: Effort normal. She has decreased breath sounds in the right lower field and the left lower field. She has no wheezes. She has no rales.  Decreased breath sounds at lower lobes bilaterally; difficult exam given body habitus   Abdominal:  Obese abdomen. Mild rigidity to umbilical area. Diffusely tender.   Musculoskeletal: Normal range of motion. She exhibits no deformity.  Neurological: She is alert and oriented to person, place, and time.  Sensation to light touch intact in feet   Skin: Skin is warm and dry. Capillary refill takes less than 2 seconds.  Psychiatric: She has a normal mood and affect. Her behavior is normal. Judgment and thought content normal.  Nursing note and vitals  reviewed.    ED Treatments / Results   Labs (all labs ordered are listed, but only abnormal results are displayed) Labs Reviewed  COMPREHENSIVE METABOLIC PANEL - Abnormal; Notable for the following components:      Result Value   Chloride 97 (*)    Glucose, Bld 124 (*)    All other components within normal limits  CBC - Abnormal; Notable for the following components:   WBC 24.1 (*)    RBC 6.34 (*)    MCV 71.8 (*)    MCH 21.5 (*)    MCHC 29.9 (*)    RDW 19.4 (*)    Platelets 446 (*)    All other components within normal limits  URINALYSIS, ROUTINE W REFLEX MICROSCOPIC - Abnormal; Notable for the following components:   APPearance CLOUDY (*)    Leukocytes, UA MODERATE (*)    Bacteria, UA RARE (*)    Squamous Epithelial / LPF 6-30 (*)    Non Squamous Epithelial 0-5 (*)    All other components within normal limits  DIFFERENTIAL - Abnormal; Notable for the following components:   Neutro Abs 18.2 (*)    Lymphs Abs 4.8 (*)    Monocytes Absolute 1.9 (*)    All other components within normal limits  URINE CULTURE  LIPASE, BLOOD  PROTIME-INR  I-STAT TROPONIN, ED  I-STAT TROPONIN, ED  POC OCCULT BLOOD, ED    EKG  EKG Interpretation  Date/Time:  Wednesday October 18 2017 15:34:32 EST Ventricular Rate:  88 PR Interval:  158 QRS Duration: 103 QT Interval:  373 QTC Calculation: 452 R Axis:   -63 Text Interpretation:  Sinus rhythm Left anterior fascicular block Abnormal R-wave progression, early transition LVH with secondary repolarization abnormality Anterior Q waves, possibly due to LVH no significant change since earlier in the day Confirmed by [name] [phone]) on 10/18/2017 3:36:47 PM       Radiology Dg Chest 2 View  Result Date: 10/18/2017 CLINICAL DATA:  Lower abdominal pain, constipation, shortness of breath, sharp chest pain and pressure for 1.5 weeks, history COPD, hypertension, coronary artery disease post MI, prior pulmonary embolism EXAM: CHEST  2 VIEW COMPARISON:  06/08/2017 FINDINGS: Normal  heart size, mediastinal contours, and pulmonary vascularity. Chronic bronchitic changes with scarring in lingula and at RIGHT base. Question new nodular density at the lateral mid RIGHT lung 8 mm diameter. No acute infiltrate, pleural effusion or pneumothorax. Bones demineralized with scattered endplate spur formation thoracic spine. IMPRESSION: Chronic bronchitic changes with lingular and RIGHT basilar scarring. Question new 8 mm RIGHT mid lung nodule; CT chest recommended to exclude new pulmonary nodule. Electronically Signed   By: Lavonia Dana M.D.   On: 10/18/2017 10:58   Ct Chest W Contrast  Result Date: 10/18/2017 CLINICAL DATA:  Back pain. No bowel movement in 1 week. Chest pain, shortness of breath, nausea. EXAM: CT CHEST, ABDOMEN, AND PELVIS WITH CONTRAST TECHNIQUE: Multidetector CT imaging of the chest, abdomen and pelvis was performed following the standard protocol during bolus administration of intravenous contrast. CONTRAST:  16mL ISOVUE-300 IOPAMIDOL (ISOVUE-300) INJECTION 61% COMPARISON:  None. FINDINGS: CT CHEST FINDINGS Cardiovascular: Heart is normal size. Aorta is normal caliber. Mediastinum/Nodes: No mediastinal, hilar, or axillary adenopathy. Trachea and esophagus are unremarkable. Thyroid unremarkable. Lungs/Pleura: No confluent airspace opacities or pleural effusions. A linear areas of scarring in the lung bases. No nodules, in particular in the right mid lung as questioned on prior chest x-ray. No effusions. Musculoskeletal: No acute bony abnormality. CT ABDOMEN PELVIS  FINDINGS Hepatobiliary: Fatty infiltration of the liver. No focal abnormality or biliary ductal dilatation. Prior cholecystectomy. Pancreas: No focal abnormality or ductal dilatation. Spleen: No focal abnormality.  Normal size. Adrenals/Urinary Tract: No adrenal abnormality. No focal renal abnormality. No stones or hydronephrosis. Urinary bladder is unremarkable. Stomach/Bowel: Large stool burden throughout the colon,  some which appears to be liquid stool with air-fluid levels. Mildly prominent: To the region of the rectum. There is sigmoid diverticulosis. Slight stranding around the sigmoid colon could reflect early active diverticulitis. Small bowel and stomach are decompressed. Vascular/Lymphatic: No evidence of aneurysm or adenopathy. Reproductive: Prior hysterectomy.  No adnexal masses. Other: Trace free fluid in the pelvis.  No free air. Musculoskeletal: No acute bony abnormality. Postoperative changes in the lower lumbar spine. IMPRESSION: No acute cardiopulmonary disease.  Bibasilar scarring. No nodules in the lungs as questioned on prior chest x-ray. Fatty infiltration of the liver. Nonspecific bowel gas pattern with prominent colon which is stool and fluid filled to the region of the rectosigmoid colon. No visible obstructing process. There is extensive sigmoid diverticulosis with suggestion of early active diverticulitis changes in the sigmoid colon. Trace free fluid in the pelvis. Electronically Signed   By: Rolm Baptise M.D.   On: 10/18/2017 12:18   Ct Abdomen Pelvis W Contrast  Result Date: 10/18/2017 CLINICAL DATA:  Back pain. No bowel movement in 1 week. Chest pain, shortness of breath, nausea. EXAM: CT CHEST, ABDOMEN, AND PELVIS WITH CONTRAST TECHNIQUE: Multidetector CT imaging of the chest, abdomen and pelvis was performed following the standard protocol during bolus administration of intravenous contrast. CONTRAST:  148mL ISOVUE-300 IOPAMIDOL (ISOVUE-300) INJECTION 61% COMPARISON:  None. FINDINGS: CT CHEST FINDINGS Cardiovascular: Heart is normal size. Aorta is normal caliber. Mediastinum/Nodes: No mediastinal, hilar, or axillary adenopathy. Trachea and esophagus are unremarkable. Thyroid unremarkable. Lungs/Pleura: No confluent airspace opacities or pleural effusions. A linear areas of scarring in the lung bases. No nodules, in particular in the right mid lung as questioned on prior chest x-ray. No  effusions. Musculoskeletal: No acute bony abnormality. CT ABDOMEN PELVIS FINDINGS Hepatobiliary: Fatty infiltration of the liver. No focal abnormality or biliary ductal dilatation. Prior cholecystectomy. Pancreas: No focal abnormality or ductal dilatation. Spleen: No focal abnormality.  Normal size. Adrenals/Urinary Tract: No adrenal abnormality. No focal renal abnormality. No stones or hydronephrosis. Urinary bladder is unremarkable. Stomach/Bowel: Large stool burden throughout the colon, some which appears to be liquid stool with air-fluid levels. Mildly prominent: To the region of the rectum. There is sigmoid diverticulosis. Slight stranding around the sigmoid colon could reflect early active diverticulitis. Small bowel and stomach are decompressed. Vascular/Lymphatic: No evidence of aneurysm or adenopathy. Reproductive: Prior hysterectomy.  No adnexal masses. Other: Trace free fluid in the pelvis.  No free air. Musculoskeletal: No acute bony abnormality. Postoperative changes in the lower lumbar spine. IMPRESSION: No acute cardiopulmonary disease.  Bibasilar scarring. No nodules in the lungs as questioned on prior chest x-ray. Fatty infiltration of the liver. Nonspecific bowel gas pattern with prominent colon which is stool and fluid filled to the region of the rectosigmoid colon. No visible obstructing process. There is extensive sigmoid diverticulosis with suggestion of early active diverticulitis changes in the sigmoid colon. Trace free fluid in the pelvis. Electronically Signed   By: Rolm Baptise M.D.   On: 10/18/2017 12:18    Procedures Procedures (including critical care time)  Medications Ordered in ED Medications  ciprofloxacin (CIPRO) IVPB 400 mg (400 mg Intravenous New Bag/Given 10/18/17 1525)  ondansetron (ZOFRAN) injection  4 mg (4 mg Intravenous Given 10/18/17 1250)  sodium chloride 0.9 % bolus 1,000 mL (1,000 mLs Intravenous New Bag/Given 10/18/17 1250)  iopamidol (ISOVUE-300) 61 %  injection (100 mLs  Contrast Given 10/18/17 1155)  metroNIDAZOLE (FLAGYL) IVPB 500 mg (0 mg Intravenous Stopped 10/18/17 1415)  fentaNYL (SUBLIMAZE) injection 50 mcg (50 mcg Intravenous Given 10/18/17 1251)  HYDROmorphone (DILAUDID) injection 1 mg (1 mg Intravenous Given 10/18/17 1335)  ketorolac (TORADOL) 30 MG/ML injection 30 mg (30 mg Intravenous Given 10/18/17 1549)     Initial Impression / Assessment and Plan / ED Course  I have reviewed the triage vital signs and the nursing notes.  Pertinent labs & imaging results that were available during my care of the patient were reviewed by me and considered in my medical decision making (see chart for details).  Clinical Course as of Oct 18 1605  Wed Oct 18, 2017  1256 Leukocytes, UA: (!) MODERATE [CG]  1256 WBC, UA: TOO NUMEROUS TO COUNT [CG]  1256 WBC: (!) 24.1 [CG]  1257 IMPRESSION: No acute cardiopulmonary disease. Bibasilar scarring.  No nodules in the lungs as questioned on prior chest x-ray.  Fatty infiltration of the liver.  Nonspecific bowel gas pattern with prominent colon which is stool and fluid filled to the region of the rectosigmoid colon. No visible obstructing process. There is extensive sigmoid diverticulosis with suggestion of early active diverticulitis changes in the sigmoid colon.  Trace free fluid in the pelvis.  CT Abdomen Pelvis W Contrast [CG]  3244 Performed disimpaction with EMT at bedside. Small amount of hard stool removed, unable to palpate further impacted stool in rectum at end of disimpaction   [CG]  1530 Significant delay in enema.  Spoke to pt about delay. She reports continued pain in abdomen and back with nausea. Will redose zofran. Will hold off further narcotics as this may be the reason she is having constipation, will try toradol. Discussed plan for delta trop, antiemetics and discharge with pt and sister at bedside. They are agreeable.   [CG]    Clinical Course User Index [CG] Kinnie Feil, PA-C   61 yo female with h/o diverticulitis and chronic back pain on chronic opioids presents for abdominal pain and constipation. Last BM 1 week ago. She developed CP at rest in car en route to ED for abdominal pain.  Exam remarkable for diffusely tender abdomen, questionable rigidity. She has diffuse TL spine tenderness, chronic.   Lab work remarkable for leukocytosis.  UTI on urine today but on chronic suppressive antibiotics and has no urinary symptoms. Will send urine culture and defer abx for this today.  No infiltrate on CXR.  CT CAP shows prominent stool in rectosigmoid and possible early diverticulitis. DRE performed and small amount of stool removed but no significant fecal impaction. Awaiting enema.   Final Clinical Impressions(s) / ED Diagnoses   In regards to CP, pt has risk factors however CP sounds atypical. EKG w/o changes from previous. Trop negative. I don't think further emergent cardiac work up is indicated today.  This was shared with SP who evaluated pt and agrees with ED tx. Will plan on delta trop and f/u with cardiology.  1530: Significant delay in enema and delta trop.Gave  fentanyl and dilaudid in ED for abdominal pain, cipro/flagyl for diverticulitis. Pt is on warfarin, which she chcks at home once monthly last 12/6 at 3.0.  Will d/c with augmentin. Will hold off on further narcotic pain meds in ED as this  is likely cause of constipation. Plan is enema, zofran, toradol and delta trop. Anticipate d/c with GI f/u and abx. Discussed plan with pt who verbalized understanding and agreeable. Pt handed off to oncoming [place].   Final diagnoses:  Diverticulitis  Chronic midline thoracic back pain  Drug-induced constipation    ED Discharge Orders        Ordered    amoxicillin-clavulanate (AUGMENTIN) 875-125 MG tablet  Every 12 hours     10/18/17 1600         Kinnie Feil, [place] 10/18/17 1606    Hannah Gambler, MD 10/19/17 [phone]

## 2017-10-20 DIAGNOSIS — I809 Phlebitis and thrombophlebitis of unspecified site: Secondary | ICD-10-CM | POA: Diagnosis not present

## 2017-10-20 DIAGNOSIS — Z7901 Long term (current) use of anticoagulants: Secondary | ICD-10-CM | POA: Diagnosis not present

## 2017-10-20 DIAGNOSIS — I2699 Other pulmonary embolism without acute cor pulmonale: Secondary | ICD-10-CM | POA: Diagnosis not present

## 2017-10-20 LAB — URINE CULTURE

## 2017-10-31 ENCOUNTER — Encounter: Payer: Self-pay | Admitting: Adult Health

## 2017-10-31 ENCOUNTER — Ambulatory Visit (INDEPENDENT_AMBULATORY_CARE_PROVIDER_SITE_OTHER): Payer: Medicare HMO | Admitting: Adult Health

## 2017-10-31 VITALS — BP 117/80 | HR 106 | Ht 63.0 in | Wt 264.7 lb

## 2017-10-31 DIAGNOSIS — Z0000 Encounter for general adult medical examination without abnormal findings: Secondary | ICD-10-CM

## 2017-10-31 DIAGNOSIS — Z9229 Personal history of other drug therapy: Secondary | ICD-10-CM

## 2017-10-31 DIAGNOSIS — I10 Essential (primary) hypertension: Secondary | ICD-10-CM

## 2017-10-31 DIAGNOSIS — F419 Anxiety disorder, unspecified: Secondary | ICD-10-CM

## 2017-10-31 DIAGNOSIS — J449 Chronic obstructive pulmonary disease, unspecified: Secondary | ICD-10-CM

## 2017-10-31 DIAGNOSIS — G4730 Sleep apnea, unspecified: Secondary | ICD-10-CM | POA: Diagnosis not present

## 2017-10-31 DIAGNOSIS — I2699 Other pulmonary embolism without acute cor pulmonale: Secondary | ICD-10-CM | POA: Diagnosis not present

## 2017-10-31 DIAGNOSIS — G8929 Other chronic pain: Secondary | ICD-10-CM | POA: Diagnosis not present

## 2017-10-31 DIAGNOSIS — M549 Dorsalgia, unspecified: Secondary | ICD-10-CM | POA: Diagnosis not present

## 2017-10-31 DIAGNOSIS — M79604 Pain in right leg: Secondary | ICD-10-CM | POA: Diagnosis not present

## 2017-10-31 DIAGNOSIS — R69 Illness, unspecified: Secondary | ICD-10-CM | POA: Diagnosis not present

## 2017-10-31 MED ORDER — OMEPRAZOLE 40 MG PO CPDR: 40.0000 mg | DELAYED_RELEASE_CAPSULE | Freq: Two times a day (BID) | ORAL | 1 refills | Status: DC

## 2017-10-31 MED ORDER — NITROGLYCERIN 0.4 MG SL SUBL: 0.4000 mg | SUBLINGUAL_TABLET | SUBLINGUAL | 0 refills | Status: AC | PRN

## 2017-10-31 MED ORDER — METOPROLOL SUCCINATE ER 50 MG PO TB24: 50.0000 mg | ORAL_TABLET | Freq: Every day | ORAL | 1 refills | Status: DC

## 2017-10-31 MED ORDER — ALBUTEROL SULFATE HFA 108 (90 BASE) MCG/ACT IN AERS: 2.0000 | INHALATION_SPRAY | Freq: Four times a day (QID) | RESPIRATORY_TRACT | 0 refills | Status: DC | PRN

## 2017-10-31 MED ORDER — PRAVASTATIN SODIUM 40 MG PO TABS: 40.0000 mg | ORAL_TABLET | Freq: Every day | ORAL | 1 refills | Status: DC

## 2017-10-31 MED ORDER — DIAZEPAM 10 MG PO TABS: 10.0000 mg | ORAL_TABLET | Freq: Two times a day (BID) | ORAL | 0 refills | Status: DC | PRN

## 2017-10-31 MED ORDER — WARFARIN SODIUM 5 MG PO TABS: 2.5000 mg | ORAL_TABLET | ORAL | 1 refills | Status: DC

## 2017-10-31 MED ORDER — LOSARTAN POTASSIUM 50 MG PO TABS: 50.0000 mg | ORAL_TABLET | Freq: Every day | ORAL | 1 refills | Status: DC | PRN

## 2017-10-31 MED ORDER — FUROSEMIDE 40 MG PO TABS: 40.0000 mg | ORAL_TABLET | Freq: Two times a day (BID) | ORAL | 1 refills | Status: DC | PRN

## 2017-10-31 NOTE — Assessment & Plan Note (Signed)
Nightly CPAP 

## 2017-10-31 NOTE — Progress Notes (Addendum)
Subjective:    Patient ID: [name], female    DOB: February 19, 1956, 62 y.o.   MRN: [id number]  HPI:  [name] is here to establish as a new pt.  She is a pleasant 62 year old female.  PMH:HTN, HL, GERD, Morbid Obesity, Chronic back pain with radiculopathy RLE (back surgeries 1989, 1992, 2009, 12/2016-Dr. Elsner), hx of PE (1984, 2014), Hx of MI (2014, 2017?), Divertculitis, Chronic UTI, chronic narcotic dependence, anxiety, and OSA. She cannot recall last cards visit and reports needing SL nitro last month for CP/anxiety.  She was d/c'd from [facility] >3 years ago and she would like a referral to new clinic.  Current pain level is 7/10, constant and she "hurts everywhere, just everywhere". She denies tobacco use and estimates to drink 1 bottle water/day.  She reports poor appetite since last back surgery and is able to safely ambulate with walker and wears back brace "all the time". Her sister "[name]" is at [facility]  Patient Care Team    Relationship Specialty Notifications Start End  Mina [name] D, NP PCP - General Family Medicine  10/31/17   Adrian Prows, MD Consulting Physician Cardiology  08/26/15     Patient Active Problem List   Diagnosis Date Noted  . Anxiety 11/02/2017  . Healthcare maintenance 10/31/2017  . Chronic bilateral back pain 10/31/2017  . Pulmonary embolism ([place]) 10/31/2017  . COPD (chronic obstructive pulmonary disease) ([place]) 10/31/2017  . Sleep apnea 10/31/2017  . History of Coumadin therapy 10/31/2017  . Lumbar radiculopathy, chronic   . Essential hypertension   . Lumbar back pain with radiculopathy affecting right lower extremity 06/05/2017  . Palliative care by specialist   . Right leg pain 06/02/2017  . Severe low back pain 06/02/2017  . Acute right-sided low back pain with sciatica   . Chronic pulmonary embolism ([place]) 04/17/2013  . UTI (urinary tract infection) 04/17/2013  . Diverticulitis of sigmoid colon 04/16/2013  . Obesity, Class III, BMI  40-49.9 (morbid obesity) ([place]) 04/16/2013  . Chronic narcotic dependence ([place]) 04/16/2013  . Asthma 11/16/2011  . GERD (gastroesophageal reflux disease) 11/16/2011  . Hyperlipidemia 11/16/2011     Past Medical History:  Diagnosis Date  . Acid reflux   . Asthma   . Chronic pain   . COPD (chronic obstructive pulmonary disease) ([place])   . Diverticulitis   . Heart murmur   . Hiatal hernia   . High cholesterol   . History of UTI   . Hypertension   . Labral tear of shoulder    left  . Myocardial infarction ([place]) 2014  . Pneumonia 2014  . Pulmonary embolism ([place]) last 2014   x 3   . Ruptured disc, cervical    1 in neck, 1 in midback and 1 in low back, 6 bulging discs,  upper and lower back, 5 slipped discs all the way uop and down back and   . Sleep apnea    uses cpap, pt does not know settings     Past Surgical History:  Procedure Laterality Date  .  bone spurs removed from feet Bilateral   . ABDOMINAL HYSTERECTOMY    . BACK SURGERY     fusion x 2 lower back, laser surgery mid and back and neck also done  . CARPAL TUNNEL RELEASE Right   . CHOLECYSTECTOMY    . colon polyps    . COLONOSCOPY WITH PROPOFOL N/A 04/08/2016   Procedure: COLONOSCOPY WITH PROPOFOL;  Surgeon: Carol Ada,  MD;  Location: [facility];  Service: Endoscopy;  Laterality: N/A;  . colonscopy     x 2, with polyps removed both times  . IR FL GUIDED LOC OF NEEDLE/CATH TIP FOR SPINAL INJECTION RT  06/04/2017  . JOINT REPLACEMENT    . REPLACEMENT TOTAL KNEE Right   . TUBAL LIGATION       Family History  Problem Relation Age of Onset  . Heart failure Mother   . COPD Mother   . Heart failure Father   . COPD Father   . Cancer Other   . Heart failure Other      Social History   Substance and Sexual Activity  Drug Use No     Social History   Substance and Sexual Activity  Alcohol Use No     Social History   Tobacco Use  Smoking Status Never Smoker  Smokeless Tobacco Never Used      Outpatient Encounter Medications as of 10/31/2017  Medication Sig Note  . albuterol (PROVENTIL HFA;VENTOLIN HFA) 108 (90 Base) MCG/ACT inhaler Inhale 2 puffs into the lungs every 6 (six) hours as needed. For wheezing   . aspirin EC 81 MG tablet Take 81 mg by mouth daily at 12 noon.    . diazepam (VALIUM) 10 MG tablet Take 1 tablet (10 mg total) by mouth every 12 (twelve) hours as needed for anxiety.   . diclofenac sodium (VOLTAREN) 1 % GEL Apply 2 g topically 4 (four) times daily as needed (For pain in shoulder, knees, back and neck.).    Marland Kitchen furosemide (LASIX) 40 MG tablet Take 1 tablet (40 mg total) by mouth 2 (two) times daily as needed for fluid. For fluid retention.   . Hypromellose (ARTIFICIAL TEARS OP) Place 1 drop into both eyes daily as needed (dry eyes).   Marland Kitchen lactulose (CHRONULAC) 10 GM/15ML solution Take 15 mLs (10 g total) by mouth 2 (two) times daily.   Marland Kitchen lidocaine (LIDODERM) 5 % Place 1-3 patches onto the skin daily. Remove & Discard patch within 12 hours or as directed by MD   . losartan (COZAAR) 50 MG tablet Take 1 tablet (50 mg total) by mouth daily as needed (blood pressure).   . methocarbamol (ROBAXIN) 500 MG tablet Take 500 mg by mouth every 6 (six) hours.   . metoprolol succinate (TOPROL-XL) 50 MG 24 hr tablet Take 1 tablet (50 mg total) by mouth daily. Take with or immediately following a meal.   . nitroGLYCERIN (NITROSTAT) 0.4 MG SL tablet Place 1 tablet (0.4 mg total) under the tongue every 5 (five) minutes as needed for chest pain.   Marland Kitchen nystatin [facility]) powder Apply 1 g topically daily as needed (rash).   Marland Kitchen omeprazole (PRILOSEC) 40 MG capsule Take 1 capsule (40 mg total) by mouth 2 (two) times daily.   . Oxycodone HCl 10 MG TABS Take 1 tablet (10 mg total) by mouth every 6 (six) hours as needed (for severe pain).   . pravastatin (PRAVACHOL) 40 MG tablet Take 1 tablet (40 mg total) by mouth daily at 12 noon.   . trimethoprim (TRIMPEX) 100 MG tablet Take 100 mg by mouth  daily.   . Vitamin D, Ergocalciferol, (DRISDOL) 50000 units CAPS capsule Take 50,000 Units by mouth every 7 (seven) days. 06/02/2017: Fridays  . warfarin (COUMADIN) 5 MG tablet Take 0.5-1 tablets (2.5-5 mg total) by mouth See admin instructions. Take one tablet (5mg ) on Monday, Wednesday, Friday and half a tablet (2.5mg ) the rest of the  week.   . [DISCONTINUED] albuterol (PROVENTIL HFA;VENTOLIN HFA) 108 (90 BASE) MCG/ACT inhaler Inhale 2 puffs into the lungs every 6 (six) hours as needed. For wheezing   . [DISCONTINUED] diazepam (VALIUM) 10 MG tablet Take 10 mg by mouth every 8 (eight) hours as needed for anxiety.    . [DISCONTINUED] furosemide (LASIX) 40 MG tablet Take 40 mg by mouth 2 (two) times daily as needed for fluid. For fluid retention.   . [DISCONTINUED] losartan (COZAAR) 50 MG tablet Take 50 mg by mouth daily as needed (blood pressure).    . [DISCONTINUED] metoprolol succinate (TOPROL-XL) 50 MG 24 hr tablet Take 50 mg by mouth daily. Take with or immediately following a meal.  10/09/2017: Pt can't recall exact time of last dose.  . [DISCONTINUED] nitroGLYCERIN (NITROSTAT) 0.4 MG SL tablet Place 0.4 mg under the tongue every 5 (five) minutes as needed for chest pain.   . [DISCONTINUED] omeprazole (PRILOSEC) 40 MG capsule Take 40 mg by mouth 2 (two) times daily.    . [DISCONTINUED] pravastatin (PRAVACHOL) 40 MG tablet Take 40 mg by mouth daily at 12 noon.    . [DISCONTINUED] warfarin (COUMADIN) 5 MG tablet Take 2.5-5 mg by mouth See admin instructions. Take one tablet (5mg ) on Monday, Wednesday, Friday and half a tablet (2.5mg ) the rest of the week.   . [DISCONTINUED] amoxicillin-clavulanate (AUGMENTIN) 875-125 MG tablet Take 1 tablet by mouth every 12 (twelve) hours.   . [DISCONTINUED] docusate sodium (COLACE) 100 MG capsule Take 1 capsule (100 mg total) by mouth 2 (two) times daily. (Patient taking differently: Take 100 mg by mouth 2 (two) times daily as needed for mild constipation. )   .  [DISCONTINUED] docusate sodium (COLACE) 100 MG capsule Take 1 capsule (100 mg total) by mouth every 12 (twelve) hours.   . [DISCONTINUED] estradiol (ESTRACE) 0.1 MG/GM vaginal cream Place 1 Applicatorful vaginally at bedtime.   . [DISCONTINUED] magnesium hydroxide (MILK OF MAGNESIA) 400 MG/5ML suspension Take 15 mLs by mouth daily as needed for mild constipation.   . [DISCONTINUED] methylPREDNISolone (MEDROL DOSEPAK) 4 MG TBPK tablet Day 1: 8 mg PO before breakfast, 4 mg after lunch and after dinner, and 8 mg at bedtime  Day 2: 4 mg PO before breakfast, after lunch, and after dinner and 8 mg at bedtime  Day 3: 4 mg PO before breakfast, after lunch, after dinner, and at bedtime  Day 4: 4 mg PO before breakfast, after lunch, and at bedtime  Day 5: 4 mg PO before breakfast and at bedtime  Day 6: 4 mg PO before breakfast (Patient not taking: Reported on 10/18/2017)   . [DISCONTINUED] ondansetron (ZOFRAN) 4 MG tablet Take 1 tablet (4 mg total) by mouth every 8 (eight) hours as needed for nausea or vomiting.   . [DISCONTINUED] predniSONE (DELTASONE) 20 MG tablet Take 2 tablets (40 mg total) by mouth daily with breakfast. For the next four days 10/09/2017: Pt started medication yesterday.   No facility-administered encounter medications on file as of 10/31/2017.     Allergies: Latex; Other; Shellfish allergy; and Tape  Body mass index is 46.89 kg/m.  Blood pressure 117/80, pulse (!) 106, height 5\' 3"  (1.6 m), weight 264 lb 11.2 oz (120.1 kg), SpO2 96 %.   Review of Systems  Constitutional: Positive for activity change, appetite change and fatigue. Negative for chills, diaphoresis, fever and unexpected weight change.  HENT: Negative for congestion.   Eyes: Negative for visual disturbance.  Respiratory: Negative for cough, chest tightness,  shortness of breath, wheezing and stridor.   Cardiovascular: Negative for chest pain, palpitations and leg swelling.  Gastrointestinal: Negative for  abdominal distention, abdominal pain, blood in stool, constipation, diarrhea, nausea and vomiting.  Genitourinary: Negative for difficulty urinating and flank pain.  Musculoskeletal: Positive for arthralgias, back pain, gait problem, joint swelling, myalgias, neck pain and neck stiffness.  Neurological: Negative for dizziness and headaches.  Hematological: Does not bruise/bleed easily.  Psychiatric/Behavioral: Positive for dysphoric mood and sleep disturbance. The patient is nervous/anxious.        Objective:   Physical Exam  Constitutional: She is oriented to person, place, and time. She appears well-developed and well-nourished. No distress.  Cardiovascular: Normal rate, regular rhythm, normal heart sounds and intact distal pulses.  No murmur heard. Pulmonary/Chest: Breath sounds normal. No respiratory distress. She has no wheezes. She has no rales. She exhibits no tenderness.  Neurological: She is alert and oriented to person, place, and time.  Skin: Skin is warm and dry. No rash noted. She is not diaphoretic. No erythema. There is pallor.  Psychiatric: Her speech is normal and behavior is normal. Judgment and thought content normal. Her affect is blunt. Cognition and memory are normal.          Assessment & Plan:   1. Chronic bilateral back pain, unspecified back location   2. Essential hypertension   3. Right leg pain   4. Healthcare maintenance   5. Other pulmonary embolism without acute cor pulmonale, unspecified chronicity ([place])   6. Chronic obstructive pulmonary disease, unspecified COPD type ([place])   7. Sleep apnea, unspecified type   8. History of Coumadin therapy   9. Anxiety     Essential hypertension BP at goal 117/80, HR 106 Currently taking- Toprol 50mg  daily Losartan 50mg  daily Furosemide 40mg  BID PRN EDEMA Nitro 0.4mg  SL- last dose >4 weeks ago Cannot recall her last f/u with cards  Sleep apnea Nightly CPAP  Chronic bilateral back pain Referral to  pain clinic placed [facility] Controlled Substance Database reviewed- received multiple Rx's for oxycodone dec/2018  Healthcare maintenance Pain referral placed. Please continue all medications as directed, appropriate refills sent in. 1 month of Diazepam provided, pain clinic will take over management once you establish with them. Continue to check INR monthly and call us with results prior to Warfarin refill.  Please schedule follow-up in 4 weeks so we can further address all health concerns.  History of Coumadin therapy Checks INR monthly at home, last level 10/28/17-2.8 Refilled current regime today Denies prolonged bleeding/excessiuve bruising   Anxiety [facility] Controlled Substance Database reviewed.  Last refill on Diazepam- 11/08/16 30 day supply, 90 ct Diazepam 10mg  Q12H PRN anxiety, 30 ct provided today. Her new pain clinic will take over rx'g once she establishes.  Pt was in the office today for 60+ minutes, with over 50% time spent in face to face counseling of various medical concerns and in coordination of care  FOLLOW-UP:  Return in about 4 weeks (around 11/28/2017) for Regular Follow Up.

## 2017-10-31 NOTE — Assessment & Plan Note (Signed)
Pain referral placed. Please continue all medications as directed, appropriate refills sent in. 1 month of Diazepam provided, pain clinic will take over management once you establish with them. Continue to check INR monthly and call us with results prior to Warfarin refill.  Please schedule follow-up in 4 weeks so we can further address all health concerns.

## 2017-10-31 NOTE — Assessment & Plan Note (Signed)
Checks INR monthly at home, last level 10/28/17-2.8 Refilled current regime today Denies prolonged bleeding/excessiuve bruising

## 2017-10-31 NOTE — Assessment & Plan Note (Signed)
BP at goal 117/80, HR 106 Currently taking- Toprol 50mg  daily Losartan 50mg  daily Furosemide 40mg  BID PRN EDEMA Nitro 0.4mg  SL- last dose >4 weeks ago Cannot recall her last f/u with cards

## 2017-10-31 NOTE — Assessment & Plan Note (Signed)
Referral to pain clinic placed [facility] Controlled Substance Database reviewed- received multiple Rx's for oxycodone dec/2018

## 2017-10-31 NOTE — Patient Instructions (Addendum)
Heart-Healthy Eating Plan Many factors influence your heart health, including eating and exercise habits. Heart (coronary) risk increases with abnormal blood fat (lipid) levels. Heart-healthy meal planning includes limiting unhealthy fats, increasing healthy fats, and making other small dietary changes. This includes maintaining a healthy body weight to help keep lipid levels within a normal range. What is my plan? Your health care provider recommends that you:  Get no more than ___25___% of the total calories in your daily diet from fat.  Limit your intake of saturated fat to less than __5___% of your total calories each day.  Limit the amount of cholesterol in your diet to less than _300__ mg per day.  What types of fat should I choose?  Choose healthy fats more often. Choose monounsaturated and polyunsaturated fats, such as olive oil and canola oil, flaxseeds, walnuts, almonds, and seeds.  Eat more omega-3 fats. Good choices include salmon, mackerel, sardines, tuna, flaxseed oil, and ground flaxseeds. Aim to eat fish at least two times each week.  Limit saturated fats. Saturated fats are primarily found in animal products, such as meats, butter, and cream. Plant sources of saturated fats include palm oil, palm kernel oil, and coconut oil.  Avoid foods with partially hydrogenated oils in them. These contain trans fats. Examples of foods that contain trans fats are stick margarine, some tub margarines, cookies, crackers, and other baked goods. What general guidelines do I need to follow?  Check food labels carefully to identify foods with trans fats or high amounts of saturated fat.  Fill one half of your plate with vegetables and green salads. Eat 4-5 servings of vegetables per day. A serving of vegetables equals 1 cup of raw leafy vegetables,  cup of raw or cooked cut-up vegetables, or  cup of vegetable juice.  Fill one fourth of your plate with whole grains. Look for the word "whole"  as the first word in the ingredient list.  Fill one fourth of your plate with lean protein foods.  Eat 4-5 servings of fruit per day. A serving of fruit equals one medium whole fruit,  cup of dried fruit,  cup of fresh, frozen, or canned fruit, or  cup of 100% fruit juice.  Eat more foods that contain soluble fiber. Examples of foods that contain this type of fiber are apples, broccoli, carrots, beans, peas, and barley. Aim to get 20-30 g of fiber per day.  Eat more home-cooked food and less restaurant, buffet, and fast food.  Limit or avoid alcohol.  Limit foods that are high in starch and sugar.  Avoid fried foods.  Cook foods by using methods other than frying. Baking, boiling, grilling, and broiling are all great options. Other fat-reducing suggestions include: ? Removing the skin from poultry. ? Removing all visible fats from meats. ? Skimming the fat off of stews, soups, and gravies before serving them. ? Steaming vegetables in water or broth.  Lose weight if you are overweight. Losing just 5-10% of your initial body weight can help your overall health and prevent diseases such as diabetes and heart disease.  Increase your consumption of nuts, legumes, and seeds to 4-5 servings per week. One serving of dried beans or legumes equals  cup after being cooked, one serving of nuts equals 1 ounces, and one serving of seeds equals  ounce or 1 tablespoon.  You may need to monitor your salt (sodium) intake, especially if you have high blood pressure. Talk with your health care provider or dietitian to get  more information about reducing sodium. What foods can I eat? Grains  Breads, including [place], white, pita, wheat, raisin, rye, oatmeal, and [place]. Tortillas that are neither fried nor made with lard or trans fat. Low-fat rolls, including hotdog and hamburger buns and English muffins. Biscuits. Muffins. Waffles. Pancakes. Light popcorn. Whole-grain cereals. Flatbread. Melba  toast. Pretzels. Breadsticks. Rusks. Low-fat snacks and crackers, including oyster, saltine, matzo, graham, animal, and rye. Rice and pasta, including brown rice and those that are made with whole wheat. Vegetables All vegetables. Fruits All fruits, but limit coconut. Meats and Other Protein Sources Lean, well-trimmed beef, veal, pork, and lamb. Chicken and [place] without skin. All fish and shellfish. Wild duck, rabbit, pheasant, and venison. Egg whites or low-cholesterol egg substitutes. Dried beans, peas, lentils, and tofu.Seeds and most nuts. Dairy Low-fat or nonfat cheeses, including ricotta, string, and mozzarella. Skim or 1% milk that is liquid, powdered, or evaporated. Buttermilk that is made with low-fat milk. Nonfat or low-fat yogurt. Beverages Mineral water. Diet carbonated beverages. Sweets and Desserts Sherbets and fruit ices. Honey, jam, marmalade, jelly, and syrups. Meringues and gelatins. Pure sugar candy, such as hard candy, jelly beans, gumdrops, mints, marshmallows, and small amounts of dark chocolate. [employer] Eat all sweets and desserts in moderation. Fats and Oils Nonhydrogenated (trans-free) margarines. Vegetable oils, including soybean, sesame, sunflower, olive, peanut, safflower, corn, canola, and cottonseed. Salad dressings or mayonnaise that are made with a vegetable oil. Limit added fats and oils that you use for cooking, baking, salads, and as spreads. Other Cocoa powder. Coffee and tea. All seasonings and condiments. The items listed above may not be a complete list of recommended foods or beverages. Contact your dietitian for more options. What foods are not recommended? Grains Breads that are made with saturated or trans fats, oils, or whole milk. Croissants. Butter rolls. Cheese breads. Sweet rolls. Donuts. Buttered popcorn. Chow mein noodles. High-fat crackers, such as cheese or butter crackers. Meats and Other Protein Sources Fatty meats, such as  hotdogs, short ribs, sausage, spareribs, bacon, ribeye roast or steak, and mutton. High-fat deli meats, such as salami and bologna. Caviar. Domestic duck and goose. Organ meats, such as kidney, liver, sweetbreads, brains, gizzard, chitterlings, and heart. Dairy Cream, sour cream, cream cheese, and creamed cottage cheese. Whole milk cheeses, including blue (bleu), Monte[place] Jack, [place], [place], American, [place], Swiss, [place], [place], and [place]. Whole or 2% milk that is liquid, evaporated, or condensed. Whole buttermilk. Cream sauce or high-fat cheese sauce. Yogurt that is made from whole milk. Beverages Regular sodas and drinks with added sugar. Sweets and Desserts Frosting. Pudding. Cookies. Cakes other than angel food cake. Candy that has milk chocolate or white chocolate, hydrogenated fat, butter, coconut, or unknown ingredients. Buttered syrups. Full-fat ice cream or ice cream drinks. Fats and Oils Gravy that has suet, meat fat, or shortening. Cocoa butter, hydrogenated oils, palm oil, coconut oil, palm kernel oil. These can often be found in baked products, candy, fried foods, nondairy creamers, and whipped toppings. Solid fats and shortenings, including bacon fat, salt pork, lard, and butter. Nondairy cream substitutes, such as coffee creamers and sour cream substitutes. Salad dressings that are made of unknown oils, cheese, or sour cream. The items listed above may not be a complete list of foods and beverages to avoid. Contact your dietitian for more information. This information is not intended to replace advice given to you by your health care provider. Make sure you discuss any questions you have with your health care  provider. Document Released: 07/19/2008 Document Revised: 04/29/2016 Document Reviewed: 04/03/2014 Elsevier Interactive Patient Education  2018 [employer].   Hypertension Hypertension, commonly called high blood pressure, is when the force of blood pumping through the  arteries is too strong. The arteries are the blood vessels that carry blood from the heart throughout the body. Hypertension forces the heart to work harder to pump blood and may cause arteries to become narrow or stiff. Having untreated or uncontrolled hypertension can cause heart attacks, strokes, kidney disease, and other problems. A blood pressure reading consists of a higher number over a lower number. Ideally, your blood pressure should be below 120/80. The first ("top") number is called the systolic pressure. It is a measure of the pressure in your arteries as your heart beats. The second ("bottom") number is called the diastolic pressure. It is a measure of the pressure in your arteries as the heart relaxes. What are the causes? The cause of this condition is not known. What increases the risk? Some risk factors for high blood pressure are under your control. Others are not. Factors you can change  Smoking.  Having type 2 diabetes mellitus, high cholesterol, or both.  Not getting enough exercise or physical activity.  Being overweight.  Having too much fat, sugar, calories, or salt (sodium) in your diet.  Drinking too much alcohol. Factors that are difficult or impossible to change  Having chronic kidney disease.  Having a family history of high blood pressure.  Age. Risk increases with age.  Race. You may be at higher risk if you are African-American.  Gender. Men are at higher risk than women before age 58. After age 61, women are at higher risk than men.  Having obstructive sleep apnea.  Stress. What are the signs or symptoms? Extremely high blood pressure (hypertensive crisis) may cause:  Headache.  Anxiety.  Shortness of breath.  Nosebleed.  Nausea and vomiting.  Severe chest pain.  Jerky movements you cannot control (seizures).  How is this diagnosed? This condition is diagnosed by measuring your blood pressure while you are seated, with your arm  resting on a surface. The cuff of the blood pressure monitor will be placed directly against the skin of your upper arm at the level of your heart. It should be measured at least twice using the same arm. Certain conditions can cause a difference in blood pressure between your right and left arms. Certain factors can cause blood pressure readings to be lower or higher than normal (elevated) for a short period of time:  When your blood pressure is higher when you are in a health care provider's office than when you are at home, this is called white coat hypertension. Most people with this condition do not need medicines.  When your blood pressure is higher at home than when you are in a health care provider's office, this is called masked hypertension. Most people with this condition may need medicines to control blood pressure.  If you have a high blood pressure reading during one visit or you have normal blood pressure with other risk factors:  You may be asked to return on a different day to have your blood pressure checked again.  You may be asked to monitor your blood pressure at home for 1 week or longer.  If you are diagnosed with hypertension, you may have other blood or imaging tests to help your health care provider understand your overall risk for other conditions. How is this treated? This  condition is treated by making healthy lifestyle changes, such as eating healthy foods, exercising more, and reducing your alcohol intake. Your health care provider may prescribe medicine if lifestyle changes are not enough to get your blood pressure under control, and if:  Your systolic blood pressure is above 130.  Your diastolic blood pressure is above 80.  Your personal target blood pressure may vary depending on your medical conditions, your age, and other factors. Follow these instructions at home: Eating and drinking  Eat a diet that is high in fiber and potassium, and low in sodium,  added sugar, and fat. An example eating plan is called the DASH (Dietary Approaches to Stop Hypertension) diet. To eat this way: ? Eat plenty of fresh fruits and vegetables. Try to fill half of your plate at each meal with fruits and vegetables. ? Eat whole grains, such as whole wheat pasta, brown rice, or whole grain bread. Fill about one quarter of your plate with whole grains. ? Eat or drink low-fat dairy products, such as skim milk or low-fat yogurt. ? Avoid fatty cuts of meat, processed or cured meats, and poultry with skin. Fill about one quarter of your plate with lean proteins, such as fish, chicken without skin, beans, eggs, and tofu. ? Avoid premade and processed foods. These tend to be higher in sodium, added sugar, and fat.  Reduce your daily sodium intake. Most people with hypertension should eat less than 1,500 mg of sodium a day.  Limit alcohol intake to no more than 1 drink a day for nonpregnant women and 2 drinks a day for men. One drink equals 12 oz of beer, 5 oz of wine, or 1 oz of hard liquor. Lifestyle  Work with your health care provider to maintain a healthy body weight or to lose weight. Ask what an ideal weight is for you.  Get at least 30 minutes of exercise that causes your heart to beat faster (aerobic exercise) most days of the week. Activities may include walking, swimming, or biking.  Include exercise to strengthen your muscles (resistance exercise), such as pilates or lifting weights, as part of your weekly exercise routine. Try to do these types of exercises for 30 minutes at least 3 days a week.  Do not use any products that contain nicotine or tobacco, such as cigarettes and e-cigarettes. If you need help quitting, ask your health care provider.  Monitor your blood pressure at home as told by your health care provider.  Keep all follow-up visits as told by your health care provider. This is important. Medicines  Take over-the-counter and prescription  medicines only as told by your health care provider. Follow directions carefully. Blood pressure medicines must be taken as prescribed.  Do not skip doses of blood pressure medicine. Doing this puts you at risk for problems and can make the medicine less effective.  Ask your health care provider about side effects or reactions to medicines that you should watch for. Contact a health care provider if:  You think you are having a reaction to a medicine you are taking.  You have headaches that keep coming back (recurring).  You feel dizzy.  You have swelling in your ankles.  You have trouble with your vision. Get help right away if:  You develop a severe headache or confusion.  You have unusual weakness or numbness.  You feel faint.  You have severe pain in your chest or abdomen.  You vomit repeatedly.  You have trouble breathing.  Summary  Hypertension is when the force of blood pumping through your arteries is too strong. If this condition is not controlled, it may put you at risk for serious complications.  Your personal target blood pressure may vary depending on your medical conditions, your age, and other factors. For most people, a normal blood pressure is less than 120/80.  Hypertension is treated with lifestyle changes, medicines, or a combination of both. Lifestyle changes include weight loss, eating a healthy, low-sodium diet, exercising more, and limiting alcohol. This information is not intended to replace advice given to you by your health care provider. Make sure you discuss any questions you have with your health care provider. Document Released: 10/10/2005 Document Revised: 09/07/2016 Document Reviewed: 09/07/2016 Elsevier Interactive Patient Education  2018 [employer].   Chronic Pain, Adult Chronic pain is a type of pain that lasts or keeps coming back (recurs) for at least six months. You may have chronic headaches, abdominal pain, or body pain. Chronic  pain may be related to an illness, such as fibromyalgia or complex regional pain syndrome. Sometimes the cause of chronic pain is not known. Chronic pain can make it hard for you to do daily activities. If not treated, chronic pain can lead to other health problems, including anxiety and depression. Treatment depends on the cause and severity of your pain. You may need to work with a pain specialist to come up with a treatment plan. The plan may include medicine, counseling, and physical therapy. Many people benefit from a combination of two or more types of treatment to control their pain. Follow these instructions at home: Lifestyle  Consider keeping a pain diary to share with your health care providers.  Consider talking with a mental health care provider (psychologist) about how to cope with chronic pain.  Consider joining a chronic pain support group.  Try to control or lower your stress levels. Talk to your health care provider about strategies to do this. General instructions   Take over-the-counter and prescription medicines only as told by your health care provider.  Follow your treatment plan as told by your health care provider. This may include: ? Gentle, regular exercise. ? Eating a healthy diet that includes foods such as vegetables, fruits, fish, and lean meats. ? Cognitive or behavioral therapy. ? Working with a Community education officer. ? Meditation or yoga. ? Acupuncture or massage therapy. ? Aroma, color, light, or sound therapy. ? Local electrical stimulation. ? Shots (injections) of numbing or pain-relieving medicines into the spine or the area of pain.  Check your pain level as told by your health care provider. Ask your health care provider if you should use a pain scale.  Learn as much as you can about how to manage your chronic pain. Ask your health care provider if an intensive pain rehabilitation program or a chronic pain specialist would be helpful.  Keep all  follow-up visits as told by your health care provider. This is important. Contact a health care provider if:  Your pain gets worse.  You have new pain.  You have trouble sleeping.  You have trouble doing your normal activities.  Your pain is not controlled with treatment.  Your have side effects from pain medicine.  You feel weak. Get help right away if:  You lose feeling or have numbness in your body.  You lose control of bowel or bladder function.  Your pain suddenly gets much worse.  You develop shaking or chills.  You develop confusion.  You develop chest pain.  You have trouble breathing or shortness of breath.  You pass out.  You have thoughts about hurting yourself or others. This information is not intended to replace advice given to you by your health care provider. Make sure you discuss any questions you have with your health care provider. Document Released: 07/02/2002 Document Revised: 06/09/2016 Document Reviewed: 03/29/2016 Elsevier Interactive Patient Education  2018 [employer].  Pain referral placed. Please continue all medications as directed, appropriate refills sent in. 1 month of Diazepam provided, pain clinic will take over management once you establish with them. Continue to check INR monthly and call us with results prior to Warfarin refill.  Please schedule follow-up in 4 weeks so we can further address all health concerns. WELCOME TO THE PRACTICE!

## 2017-11-02 DIAGNOSIS — M48062 Spinal stenosis, lumbar region with neurogenic claudication: Secondary | ICD-10-CM | POA: Diagnosis not present

## 2017-11-02 DIAGNOSIS — F419 Anxiety disorder, unspecified: Secondary | ICD-10-CM | POA: Insufficient documentation

## 2017-11-02 NOTE — Assessment & Plan Note (Signed)
[place] Controlled Substance Database reviewed.  Last refill on Diazepam- 11/08/16 30 day supply, 90 ct Diazepam 10mg  Q12H PRN anxiety, 30 ct provided today. Her new pain clinic will take over rx'g once she establishes.

## 2017-11-15 DIAGNOSIS — M5416 Radiculopathy, lumbar region: Secondary | ICD-10-CM | POA: Diagnosis not present

## 2017-11-15 DIAGNOSIS — M179 Osteoarthritis of knee, unspecified: Secondary | ICD-10-CM | POA: Diagnosis not present

## 2017-11-15 DIAGNOSIS — J449 Chronic obstructive pulmonary disease, unspecified: Secondary | ICD-10-CM | POA: Diagnosis not present

## 2017-11-27 NOTE — Progress Notes (Signed)
Subjective:    Patient ID: [name], female    DOB: 23-Jan-1956, 62 y.o.   MRN: [id number]  HPI: 10/31/17 OV:  [name] is here to establish as a new pt.  She is a pleasant 62 year old female.  PMH:HTN, HL, GERD, Morbid Obesity, Chronic back pain with radiculopathy RLE (back surgeries 1989, 1992, 2009, 12/2016-Dr. Elsner), hx of PE (1984, 2014), Hx of MI (2014, 2017?), Divertculitis, Chronic UTI, chronic narcotic dependence, anxiety, and OSA. She cannot recall last cards visit and reports needing SL nitro last month for CP/anxiety.  She was d/c'd from [facility] >3 years ago and she would like a referral to new clinic.  Current pain level is 7/10, constant and she "hurts everywhere, just everywhere". She denies tobacco use and estimates to drink 1 bottle water/day.  She reports poor appetite since last back surgery and is able to safely ambulate with walker and wears back brace "all the time". Her sister "[name]" is at [facility]  11/28/17 OV: [name] is here for f/u: HTN, chronic back pain- Pain Clinic called/left her VM last Thursday- she has not returned their call. Neurosurgeon called her/left her VM last Thursday as well, she has not returned their call. She reports intermittent chest tightness the last few weeks and required 2 doses of Nitro SL, with the last dose about 7-8 days ago. EKG today- Septal Infarct, age indeterminate- she has hx of MI in 2014 No sig changes from tracing 12/18 She has not been seen by cards in 2-3 years She is having a very difficult time preparing food and eating a balanced diet.  She is also experiencing immobility and sig insomnia r/t back pain Sister at [facility] during [place]  Patient Care Team    Relationship Specialty Notifications Start End  Mina [name] D, NP PCP - General Family Medicine  10/31/17   Adrian Prows, MD Consulting Physician Cardiology  08/26/15     Patient Active Problem List   Diagnosis Date Noted  . Chest tightness 11/28/2017  .  Anxiety 11/02/2017  . Healthcare maintenance 10/31/2017  . Chronic bilateral back pain 10/31/2017  . Pulmonary embolism ([place]) 10/31/2017  . COPD (chronic obstructive pulmonary disease) ([place]) 10/31/2017  . Sleep apnea 10/31/2017  . History of Coumadin therapy 10/31/2017  . Lumbar radiculopathy, chronic   . Essential hypertension   . Lumbar back pain with radiculopathy affecting right lower extremity 06/05/2017  . Palliative care by specialist   . Right leg pain 06/02/2017  . Severe low back pain 06/02/2017  . Acute right-sided low back pain with sciatica   . 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 UTI (urinary tract infection) 04/17/2013  . Diverticulitis of sigmoid colon 04/16/2013  . Obesity, Class III, BMI 40-49.9 (morbid obesity) ([place]) 04/16/2013  . Chronic narcotic dependence ([place]) 04/16/2013  . Asthma 11/16/2011  . GERD (gastroesophageal reflux disease) 11/16/2011  . Hyperlipidemia 11/16/2011     Past Medical History:  Diagnosis Date  . Acid reflux   . Asthma   . Chronic pain   . COPD (chronic obstructive pulmonary disease) ([place])   . Diverticulitis   . Heart murmur   . Hiatal hernia   . High cholesterol   . History of UTI   . Hypertension   . Labral tear of shoulder    left  . Myocardial infarction ([place]) 2014  . Pneumonia 2014  . Pulmonary embolism ([place]) last 2014   x 3   . Ruptured disc, cervical  1 in neck, 1 in midback and 1 in low back, 6 bulging discs,  upper and lower back, 5 slipped discs all the way uop and down back and   . Sleep apnea    uses cpap, pt does not know settings     Past Surgical History:  Procedure Laterality Date  .  bone spurs removed from feet Bilateral   . ABDOMINAL HYSTERECTOMY    . BACK SURGERY     fusion x 2 lower back, laser surgery mid and back and neck also done  . CARPAL TUNNEL RELEASE Right   . CHOLECYSTECTOMY    . colon polyps    . COLONOSCOPY WITH PROPOFOL N/A 04/08/2016   Procedure: COLONOSCOPY WITH  PROPOFOL;  Surgeon: Carol Ada, MD;  Location: [facility];  Service: Endoscopy;  Laterality: N/A;  . colonscopy     x 2, with polyps removed both times  . IR FL GUIDED LOC OF NEEDLE/CATH TIP FOR SPINAL INJECTION RT  06/04/2017  . JOINT REPLACEMENT    . REPLACEMENT TOTAL KNEE Right   . TUBAL LIGATION       Family History  Problem Relation Age of Onset  . Heart failure Mother   . COPD Mother   . Heart failure Father   . COPD Father   . Cancer Other   . Heart failure Other      Social History   Substance and Sexual Activity  Drug Use No     Social History   Substance and Sexual Activity  Alcohol Use No     Social History   Tobacco Use  Smoking Status Never Smoker  Smokeless Tobacco Never Used     Outpatient Encounter Medications as of 11/28/2017  Medication Sig Note  . albuterol (PROVENTIL HFA;VENTOLIN HFA) 108 (90 Base) MCG/ACT inhaler Inhale 2 puffs into the lungs every 6 (six) hours as needed. For wheezing   . aspirin EC 81 MG tablet Take 81 mg by mouth daily at 12 noon.    . diazepam (VALIUM) 10 MG tablet Take 1 tablet (10 mg total) by mouth every 12 (twelve) hours as needed for anxiety.   . diclofenac sodium (VOLTAREN) 1 % GEL Apply 2 g topically 4 (four) times daily as needed (For pain in shoulder, knees, back and neck.).    Marland Kitchen furosemide (LASIX) 40 MG tablet Take 1 tablet (40 mg total) by mouth 2 (two) times daily as needed for fluid. For fluid retention.   . Hypromellose (ARTIFICIAL TEARS OP) Place 1 drop into both eyes daily as needed (dry eyes).   Marland Kitchen lactulose (CHRONULAC) 10 GM/15ML solution Take 15 mLs (10 g total) by mouth 2 (two) times daily.   Marland Kitchen lidocaine (LIDODERM) 5 % Place 1-3 patches onto the skin daily. Remove & Discard patch within 12 hours or as directed by MD   . losartan (COZAAR) 50 MG tablet Take 1 tablet (50 mg total) by mouth daily as needed (blood pressure).   . methocarbamol (ROBAXIN) 500 MG tablet Take 500 mg by mouth every 6 (six) hours.    . metoprolol succinate (TOPROL-XL) 50 MG 24 hr tablet Take 1 tablet (50 mg total) by mouth daily. Take with or immediately following a meal.   . nitroGLYCERIN (NITROSTAT) 0.4 MG SL tablet Place 1 tablet (0.4 mg total) under the tongue every 5 (five) minutes as needed for chest pain.   Marland Kitchen nystatin [facility]) powder Apply 1 g topically daily as needed (rash).   Marland Kitchen omeprazole (PRILOSEC) 40 MG  capsule Take 1 capsule (40 mg total) by mouth 2 (two) times daily.   . Oxycodone HCl 10 MG TABS Take 1 tablet (10 mg total) by mouth every 6 (six) hours as needed (for severe pain).   . pravastatin (PRAVACHOL) 40 MG tablet Take 1 tablet (40 mg total) by mouth daily at 12 noon.   . trimethoprim (TRIMPEX) 100 MG tablet Take 100 mg by mouth daily.   . Vitamin D, Ergocalciferol, (DRISDOL) 50000 units CAPS capsule Take 50,000 Units by mouth every 7 (seven) days. 06/02/2017: Fridays  . warfarin (COUMADIN) 5 MG tablet Take 0.5-1 tablets (2.5-5 mg total) by mouth See admin instructions. Take one tablet (5mg ) on Monday, Wednesday, Friday and half a tablet (2.5mg ) the rest of the week.   . [DISCONTINUED] diazepam (VALIUM) 10 MG tablet Take 1 tablet (10 mg total) by mouth every 12 (twelve) hours as needed for anxiety.    No facility-administered encounter medications on file as of 11/28/2017.     Allergies: Latex; Other; Shellfish allergy; and Tape  Body mass index is 47.35 kg/m.  Blood pressure (!) 162/83, pulse 91, height 5\' 3"  (1.6 m), weight 267 lb 4.8 oz (121.2 kg), SpO2 98 %.   Review of Systems  Constitutional: Positive for activity change, appetite change and fatigue. Negative for chills, diaphoresis, fever and unexpected weight change.  HENT: Negative for congestion.   Eyes: Negative for visual disturbance.  Respiratory: Positive for chest tightness. Negative for cough, shortness of breath, wheezing and stridor.   Cardiovascular: Positive for chest pain. Negative for palpitations and leg swelling.   Gastrointestinal: Negative for abdominal distention, abdominal pain, blood in stool, constipation, diarrhea, nausea and vomiting.  Genitourinary: Negative for difficulty urinating and flank pain.  Musculoskeletal: Positive for arthralgias, back pain, gait problem, joint swelling, myalgias, neck pain and neck stiffness.  Neurological: Negative for dizziness and headaches.  Hematological: Does not bruise/bleed easily.  Psychiatric/Behavioral: Positive for dysphoric mood and sleep disturbance. The patient is nervous/anxious.        Objective:   Physical Exam  Constitutional: She is oriented to person, place, and time. She appears well-developed and well-nourished. No distress.  Cardiovascular: Normal rate, regular rhythm, normal heart sounds and intact distal pulses.  No murmur heard. Pulmonary/Chest: Breath sounds normal. No respiratory distress. She has no wheezes. She has no rales. She exhibits no tenderness.  Neurological: She is alert and oriented to person, place, and time.  Skin: Skin is warm and dry. No rash noted. She is not diaphoretic. No erythema. There is pallor.  Psychiatric: Her speech is normal and behavior is normal. Judgment and thought content normal. Her affect is blunt. Cognition and memory are normal.          Assessment & Plan:   1. Need for influenza vaccination   2. Screening for breast cancer   3. Chest pain, unspecified type   4. Essential hypertension   5. Healthcare maintenance   6. Morbid obesity ([place])   7. Chronic obstructive pulmonary disease, unspecified COPD type ([place])   8. Lumbar back pain with radiculopathy affecting right lower extremity   9. Anxiety   10. Hyperlipidemia, unspecified hyperlipidemia type   11. History of Coumadin therapy   12. Chest tightness     Essential hypertension BP above goal 162/83, HR 91 She reports taking medication within an hr of OV Currently taking Toprol 50mg  daily, Losartan 50mg  daily   Lumbar back pain  with radiculopathy affecting right lower extremity Pain Clinic and Neurosurgeon  both called and left her VMs on 11/23/17 She has not returned calls, advised to call both practices ASAP  Healthcare maintenance Please continue all medications as directed-take consistent times. One final rx for Valium provided- you will need to request medication from pain clinic. Referral to Cardiology and Home Health placed. Please call Pain Clinic and Neurosurgeon ASAP. EKG- no sig changes from previous tracings. Please schedule fasting lab appt at your convenience and complete physical 3 months.  [place] Controlled Substance Database reviewed- last Valium rx provided on 10/31/17 One final rx for Valium provided- you will need to request medication from pain clinic. She verbalized understanding.  Hyperlipidemia Currently taking pravastatin 40mg  once daily Last LDL 11/08/16- 130 Will obtain fasting labs   History of Coumadin therapy Last INR 11/27/17-  2.7  Per home testing  Continue current regime She denies excessive bleeding   Chest tightness Intermittent for last few weeks, she has taken two doses of Nitro SL, last dose 7-8 days ago EKG today- NSR, L axis deviation, LVH, Septal Infarct, age undetermined No sig changes from previous tracing in 12/18 Cards referral placed, last contact with cards was 2-3 years ago- ?  Pt was in the office today for 60+ minutes, with over 50% time spent in face to face counseling of various medical concerns and in coordination of care  FOLLOW-UP:  Return in about 3 months (around 02/25/2018) for CPE.

## 2017-11-28 ENCOUNTER — Ambulatory Visit (INDEPENDENT_AMBULATORY_CARE_PROVIDER_SITE_OTHER): Payer: Medicare HMO | Admitting: Adult Health

## 2017-11-28 ENCOUNTER — Other Ambulatory Visit: Payer: Self-pay | Admitting: Adult Health

## 2017-11-28 ENCOUNTER — Encounter: Payer: Self-pay | Admitting: Adult Health

## 2017-11-28 VITALS — BP 162/83 | HR 91 | Ht 63.0 in | Wt 267.3 lb

## 2017-11-28 DIAGNOSIS — R69 Illness, unspecified: Secondary | ICD-10-CM | POA: Diagnosis not present

## 2017-11-28 DIAGNOSIS — F419 Anxiety disorder, unspecified: Secondary | ICD-10-CM

## 2017-11-28 DIAGNOSIS — R079 Chest pain, unspecified: Secondary | ICD-10-CM

## 2017-11-28 DIAGNOSIS — Z1231 Encounter for screening mammogram for malignant neoplasm of breast: Secondary | ICD-10-CM

## 2017-11-28 DIAGNOSIS — Z23 Encounter for immunization: Secondary | ICD-10-CM

## 2017-11-28 DIAGNOSIS — Z1239 Encounter for other screening for malignant neoplasm of breast: Secondary | ICD-10-CM

## 2017-11-28 DIAGNOSIS — M5416 Radiculopathy, lumbar region: Secondary | ICD-10-CM | POA: Diagnosis not present

## 2017-11-28 DIAGNOSIS — I10 Essential (primary) hypertension: Secondary | ICD-10-CM

## 2017-11-28 DIAGNOSIS — Z0000 Encounter for general adult medical examination without abnormal findings: Secondary | ICD-10-CM | POA: Diagnosis not present

## 2017-11-28 DIAGNOSIS — Z9229 Personal history of other drug therapy: Secondary | ICD-10-CM

## 2017-11-28 DIAGNOSIS — E785 Hyperlipidemia, unspecified: Secondary | ICD-10-CM | POA: Diagnosis not present

## 2017-11-28 DIAGNOSIS — R0789 Other chest pain: Secondary | ICD-10-CM

## 2017-11-28 DIAGNOSIS — J449 Chronic obstructive pulmonary disease, unspecified: Secondary | ICD-10-CM | POA: Diagnosis not present

## 2017-11-28 LAB — EKG 12-LEAD

## 2017-11-28 MED ORDER — DIAZEPAM 10 MG PO TABS: 10.0000 mg | ORAL_TABLET | Freq: Two times a day (BID) | ORAL | 0 refills | Status: DC | PRN

## 2017-11-28 NOTE — Assessment & Plan Note (Signed)
Pain Clinic and Neurosurgeon both called and left her VMs on 11/23/17 She has not returned calls, advised to call both practices ASAP

## 2017-11-28 NOTE — Assessment & Plan Note (Signed)
[place] Controlled Substance Database reviewed- last Valium rx provided on 10/31/17 One final rx for Valium provided- you will need to request medication from pain clinic. She verbalized understanding.

## 2017-11-28 NOTE — Assessment & Plan Note (Signed)
Intermittent for last few weeks, she has taken two doses of Nitro SL, last dose 7-8 days ago EKG today- NSR, L axis deviation, LVH, Septal Infarct, age undetermined No sig changes from previous tracing in 12/18 Cards referral placed, last contact with cards was 2-3 years ago- ?

## 2017-11-28 NOTE — Patient Instructions (Addendum)
Heart-Healthy Eating Plan Many factors influence your heart health, including eating and exercise habits. Heart (coronary) risk increases with abnormal blood fat (lipid) levels. Heart-healthy meal planning includes limiting unhealthy fats, increasing healthy fats, and making other small dietary changes. This includes maintaining a healthy body weight to help keep lipid levels within a normal range. What is my plan? Your health care provider recommends that you:  Get no more than ___25__% of the total calories in your daily diet from fat.  Limit your intake of saturated fat to less than ___5____% of your total calories each day.  Limit the amount of cholesterol in your diet to less than __300__ mg per day.  What types of fat should I choose?  Choose healthy fats more often. Choose monounsaturated and polyunsaturated fats, such as olive oil and canola oil, flaxseeds, walnuts, almonds, and seeds.  Eat more omega-3 fats. Good choices include salmon, mackerel, sardines, tuna, flaxseed oil, and ground flaxseeds. Aim to eat fish at least two times each week.  Limit saturated fats. Saturated fats are primarily found in animal products, such as meats, butter, and cream. Plant sources of saturated fats include palm oil, palm kernel oil, and coconut oil.  Avoid foods with partially hydrogenated oils in them. These contain trans fats. Examples of foods that contain trans fats are stick margarine, some tub margarines, cookies, crackers, and other baked goods. What general guidelines do I need to follow?  Check food labels carefully to identify foods with trans fats or high amounts of saturated fat.  Fill one half of your plate with vegetables and green salads. Eat 4-5 servings of vegetables per day. A serving of vegetables equals 1 cup of raw leafy vegetables,  cup of raw or cooked cut-up vegetables, or  cup of vegetable juice.  Fill one fourth of your plate with whole grains. Look for the word  "whole" as the first word in the ingredient list.  Fill one fourth of your plate with lean protein foods.  Eat 4-5 servings of fruit per day. A serving of fruit equals one medium whole fruit,  cup of dried fruit,  cup of fresh, frozen, or canned fruit, or  cup of 100% fruit juice.  Eat more foods that contain soluble fiber. Examples of foods that contain this type of fiber are apples, broccoli, carrots, beans, peas, and barley. Aim to get 20-30 g of fiber per day.  Eat more home-cooked food and less restaurant, buffet, and fast food.  Limit or avoid alcohol.  Limit foods that are high in starch and sugar.  Avoid fried foods.  Cook foods by using methods other than frying. Baking, boiling, grilling, and broiling are all great options. Other fat-reducing suggestions include: ? Removing the skin from poultry. ? Removing all visible fats from meats. ? Skimming the fat off of stews, soups, and gravies before serving them. ? Steaming vegetables in water or broth.  Lose weight if you are overweight. Losing just 5-10% of your initial body weight can help your overall health and prevent diseases such as diabetes and heart disease.  Increase your consumption of nuts, legumes, and seeds to 4-5 servings per week. One serving of dried beans or legumes equals  cup after being cooked, one serving of nuts equals 1 ounces, and one serving of seeds equals  ounce or 1 tablespoon.  You may need to monitor your salt (sodium) intake, especially if you have high blood pressure. Talk with your health care provider or dietitian to get  more information about reducing sodium. What foods can I eat? Grains  Breads, including [place], white, pita, wheat, raisin, rye, oatmeal, and [place]. Tortillas that are neither fried nor made with lard or trans fat. Low-fat rolls, including hotdog and hamburger buns and English muffins. Biscuits. Muffins. Waffles. Pancakes. Light popcorn. Whole-grain cereals. Flatbread.  Melba toast. Pretzels. Breadsticks. Rusks. Low-fat snacks and crackers, including oyster, saltine, matzo, graham, animal, and rye. Rice and pasta, including brown rice and those that are made with whole wheat. Vegetables All vegetables. Fruits All fruits, but limit coconut. Meats and Other Protein Sources Lean, well-trimmed beef, veal, pork, and lamb. Chicken and [place] without skin. All fish and shellfish. Wild duck, rabbit, pheasant, and venison. Egg whites or low-cholesterol egg substitutes. Dried beans, peas, lentils, and tofu.Seeds and most nuts. Dairy Low-fat or nonfat cheeses, including ricotta, string, and mozzarella. Skim or 1% milk that is liquid, powdered, or evaporated. Buttermilk that is made with low-fat milk. Nonfat or low-fat yogurt. Beverages Mineral water. Diet carbonated beverages. Sweets and Desserts Sherbets and fruit ices. Honey, jam, marmalade, jelly, and syrups. Meringues and gelatins. Pure sugar candy, such as hard candy, jelly beans, gumdrops, mints, marshmallows, and small amounts of dark chocolate. [employer] Eat all sweets and desserts in moderation. Fats and Oils Nonhydrogenated (trans-free) margarines. Vegetable oils, including soybean, sesame, sunflower, olive, peanut, safflower, corn, canola, and cottonseed. Salad dressings or mayonnaise that are made with a vegetable oil. Limit added fats and oils that you use for cooking, baking, salads, and as spreads. Other Cocoa powder. Coffee and tea. All seasonings and condiments. The items listed above may not be a complete list of recommended foods or beverages. Contact your dietitian for more options. What foods are not recommended? Grains Breads that are made with saturated or trans fats, oils, or whole milk. Croissants. Butter rolls. Cheese breads. Sweet rolls. Donuts. Buttered popcorn. Chow mein noodles. High-fat crackers, such as cheese or butter crackers. Meats and Other Protein Sources Fatty meats, such  as hotdogs, short ribs, sausage, spareribs, bacon, ribeye roast or steak, and mutton. High-fat deli meats, such as salami and bologna. Caviar. Domestic duck and goose. Organ meats, such as kidney, liver, sweetbreads, brains, gizzard, chitterlings, and heart. Dairy Cream, sour cream, cream cheese, and creamed cottage cheese. Whole milk cheeses, including blue (bleu), Monte[place] Jack, [place], [place], [place], [place], [place], [place], [place], and [place]. Whole or 2% milk that is liquid, evaporated, or condensed. Whole buttermilk. Cream sauce or high-fat cheese sauce. Yogurt that is made from whole milk. Beverages Regular sodas and drinks with added sugar. Sweets and Desserts Frosting. Pudding. Cookies. Cakes other than angel food cake. Candy that has milk chocolate or white chocolate, hydrogenated fat, butter, coconut, or unknown ingredients. Buttered syrups. Full-fat ice cream or ice cream drinks. Fats and Oils Gravy that has suet, meat fat, or shortening. Cocoa butter, hydrogenated oils, palm oil, coconut oil, palm kernel oil. These can often be found in baked products, candy, fried foods, nondairy creamers, and whipped toppings. Solid fats and shortenings, including bacon fat, salt pork, lard, and butter. Nondairy cream substitutes, such as coffee creamers and sour cream substitutes. Salad dressings that are made of unknown oils, cheese, or sour cream. The items listed above may not be a complete list of foods and beverages to avoid. Contact your dietitian for more information. This information is not intended to replace advice given to you by your health care provider. Make sure you discuss any questions you have with your health care  provider. Document Released: 07/19/2008 Document Revised: 04/29/2016 Document Reviewed: 04/03/2014 Elsevier Interactive Patient Education  [employer].  Please continue all medications as directed-take consistent times. One final rx for Valium provided- you will  need to request medication from pain clinic. Referral to Cardiology and Home Health placed. Please call Pain Clinic and Neurosurgeon ASAP. EKG- no sig changes from previous tracings. Please schedule fasting lab appt at your convenience and complete physical 3 months. Please call with any questions/concerns.

## 2017-11-28 NOTE — Assessment & Plan Note (Signed)
BP above goal 162/83, HR 91 She reports taking medication within an hr of OV Currently taking Toprol 50mg  daily, Losartan 50mg  daily

## 2017-11-28 NOTE — Assessment & Plan Note (Signed)
Currently taking pravastatin 40mg  once daily Last LDL 11/08/16- 130 Will obtain fasting labs

## 2017-11-28 NOTE — Assessment & Plan Note (Addendum)
Last INR 11/27/17-  2.7  Per home testing  Continue current regime She denies excessive bleeding

## 2017-11-28 NOTE — Assessment & Plan Note (Signed)
Please continue all medications as directed-take consistent times. One final rx for Valium provided- you will need to request medication from pain clinic. Referral to Cardiology and Home Health placed. Please call Pain Clinic and Neurosurgeon ASAP. EKG- no sig changes from previous tracings. Please schedule fasting lab appt at your convenience and complete physical 3 months.

## 2017-11-29 ENCOUNTER — Other Ambulatory Visit: Payer: Self-pay | Admitting: Adult Health

## 2017-12-12 ENCOUNTER — Other Ambulatory Visit: Payer: Medicare HMO

## 2017-12-12 ENCOUNTER — Other Ambulatory Visit: Payer: Self-pay

## 2017-12-12 DIAGNOSIS — Z0000 Encounter for general adult medical examination without abnormal findings: Secondary | ICD-10-CM

## 2017-12-12 DIAGNOSIS — I10 Essential (primary) hypertension: Secondary | ICD-10-CM

## 2017-12-12 DIAGNOSIS — E785 Hyperlipidemia, unspecified: Secondary | ICD-10-CM

## 2017-12-13 DIAGNOSIS — K219 Gastro-esophageal reflux disease without esophagitis: Secondary | ICD-10-CM | POA: Diagnosis not present

## 2017-12-13 DIAGNOSIS — R69 Illness, unspecified: Secondary | ICD-10-CM | POA: Diagnosis not present

## 2017-12-13 DIAGNOSIS — I10 Essential (primary) hypertension: Secondary | ICD-10-CM | POA: Diagnosis not present

## 2017-12-13 DIAGNOSIS — E7800 Pure hypercholesterolemia, unspecified: Secondary | ICD-10-CM | POA: Diagnosis not present

## 2017-12-13 DIAGNOSIS — J449 Chronic obstructive pulmonary disease, unspecified: Secondary | ICD-10-CM | POA: Diagnosis not present

## 2017-12-13 DIAGNOSIS — M5416 Radiculopathy, lumbar region: Secondary | ICD-10-CM | POA: Diagnosis not present

## 2017-12-13 DIAGNOSIS — G8929 Other chronic pain: Secondary | ICD-10-CM | POA: Diagnosis not present

## 2017-12-13 DIAGNOSIS — M5431 Sciatica, right side: Secondary | ICD-10-CM | POA: Diagnosis not present

## 2017-12-13 DIAGNOSIS — G4733 Obstructive sleep apnea (adult) (pediatric): Secondary | ICD-10-CM | POA: Diagnosis not present

## 2017-12-14 ENCOUNTER — Telehealth: Payer: Self-pay | Admitting: Adult Health

## 2017-12-14 NOTE — Telephone Encounter (Signed)
Please authorize Thanks! Valetta Fuller

## 2017-12-14 NOTE — Telephone Encounter (Signed)
Mickell with [facility] is requesting orders for nursing and social work. She can be reached at [phone]

## 2017-12-14 NOTE — Telephone Encounter (Signed)
Left message giving a verbal order to proceed with orders.

## 2017-12-15 ENCOUNTER — Ambulatory Visit: Payer: Medicare HMO

## 2017-12-16 DIAGNOSIS — M179 Osteoarthritis of knee, unspecified: Secondary | ICD-10-CM | POA: Diagnosis not present

## 2017-12-16 DIAGNOSIS — J449 Chronic obstructive pulmonary disease, unspecified: Secondary | ICD-10-CM | POA: Diagnosis not present

## 2017-12-16 DIAGNOSIS — M5416 Radiculopathy, lumbar region: Secondary | ICD-10-CM | POA: Diagnosis not present

## 2017-12-20 DIAGNOSIS — M5431 Sciatica, right side: Secondary | ICD-10-CM | POA: Diagnosis not present

## 2017-12-20 DIAGNOSIS — R69 Illness, unspecified: Secondary | ICD-10-CM | POA: Diagnosis not present

## 2017-12-20 DIAGNOSIS — E7800 Pure hypercholesterolemia, unspecified: Secondary | ICD-10-CM | POA: Diagnosis not present

## 2017-12-20 DIAGNOSIS — G4733 Obstructive sleep apnea (adult) (pediatric): Secondary | ICD-10-CM | POA: Diagnosis not present

## 2017-12-20 DIAGNOSIS — I10 Essential (primary) hypertension: Secondary | ICD-10-CM | POA: Diagnosis not present

## 2017-12-20 DIAGNOSIS — M5416 Radiculopathy, lumbar region: Secondary | ICD-10-CM | POA: Diagnosis not present

## 2017-12-20 DIAGNOSIS — G8929 Other chronic pain: Secondary | ICD-10-CM | POA: Diagnosis not present

## 2017-12-20 DIAGNOSIS — J449 Chronic obstructive pulmonary disease, unspecified: Secondary | ICD-10-CM | POA: Diagnosis not present

## 2017-12-20 DIAGNOSIS — K219 Gastro-esophageal reflux disease without esophagitis: Secondary | ICD-10-CM | POA: Diagnosis not present

## 2017-12-22 DIAGNOSIS — J449 Chronic obstructive pulmonary disease, unspecified: Secondary | ICD-10-CM | POA: Diagnosis not present

## 2017-12-22 DIAGNOSIS — M5431 Sciatica, right side: Secondary | ICD-10-CM | POA: Diagnosis not present

## 2017-12-22 DIAGNOSIS — E7800 Pure hypercholesterolemia, unspecified: Secondary | ICD-10-CM | POA: Diagnosis not present

## 2017-12-22 DIAGNOSIS — I10 Essential (primary) hypertension: Secondary | ICD-10-CM | POA: Diagnosis not present

## 2017-12-22 DIAGNOSIS — M5416 Radiculopathy, lumbar region: Secondary | ICD-10-CM | POA: Diagnosis not present

## 2017-12-22 DIAGNOSIS — K219 Gastro-esophageal reflux disease without esophagitis: Secondary | ICD-10-CM | POA: Diagnosis not present

## 2017-12-22 DIAGNOSIS — R69 Illness, unspecified: Secondary | ICD-10-CM | POA: Diagnosis not present

## 2017-12-22 DIAGNOSIS — G8929 Other chronic pain: Secondary | ICD-10-CM | POA: Diagnosis not present

## 2017-12-22 DIAGNOSIS — G4733 Obstructive sleep apnea (adult) (pediatric): Secondary | ICD-10-CM | POA: Diagnosis not present

## 2017-12-26 ENCOUNTER — Telehealth: Payer: Self-pay | Admitting: Adult Health

## 2017-12-26 DIAGNOSIS — R69 Illness, unspecified: Secondary | ICD-10-CM | POA: Diagnosis not present

## 2017-12-26 DIAGNOSIS — G4733 Obstructive sleep apnea (adult) (pediatric): Secondary | ICD-10-CM | POA: Diagnosis not present

## 2017-12-26 DIAGNOSIS — I10 Essential (primary) hypertension: Secondary | ICD-10-CM | POA: Diagnosis not present

## 2017-12-26 DIAGNOSIS — M5431 Sciatica, right side: Secondary | ICD-10-CM | POA: Diagnosis not present

## 2017-12-26 DIAGNOSIS — J449 Chronic obstructive pulmonary disease, unspecified: Secondary | ICD-10-CM | POA: Diagnosis not present

## 2017-12-26 DIAGNOSIS — M5416 Radiculopathy, lumbar region: Secondary | ICD-10-CM | POA: Diagnosis not present

## 2017-12-26 DIAGNOSIS — E7800 Pure hypercholesterolemia, unspecified: Secondary | ICD-10-CM | POA: Diagnosis not present

## 2017-12-26 DIAGNOSIS — G8929 Other chronic pain: Secondary | ICD-10-CM | POA: Diagnosis not present

## 2017-12-26 DIAGNOSIS — K219 Gastro-esophageal reflux disease without esophagitis: Secondary | ICD-10-CM | POA: Diagnosis not present

## 2017-12-26 NOTE — Telephone Encounter (Signed)
Stephanie with [facility] is requesting port chest xray for pt d/t productive cough, lung sounds in bilateral bases are diminished and pt c/o of chest tightness d/t the congestion.  Please advise.  Charyl Bigger, CMA

## 2017-12-26 NOTE — Telephone Encounter (Signed)
Authorized. Please send in order. Thank you. Valetta Fuller

## 2017-12-26 NOTE — Telephone Encounter (Signed)
Verbal authorization given to [facility] with [facility].  Charyl Bigger, CMA

## 2017-12-26 NOTE — Telephone Encounter (Signed)
[name] with [facility] is requesting a verbal mobile Xray order for patient. She can be reached at [phone]. Please advise

## 2017-12-27 ENCOUNTER — Encounter: Payer: Self-pay | Admitting: Adult Health

## 2017-12-27 DIAGNOSIS — R05 Cough: Secondary | ICD-10-CM | POA: Diagnosis not present

## 2017-12-27 DIAGNOSIS — R0989 Other specified symptoms and signs involving the circulatory and respiratory systems: Secondary | ICD-10-CM | POA: Diagnosis not present

## 2017-12-28 ENCOUNTER — Telehealth: Payer: Self-pay | Admitting: Adult Health

## 2017-12-28 NOTE — Telephone Encounter (Signed)
Pt called to give INR report reading of 2.3 taken at 12 noon on 12/28/17.  Please call patient if questions.   --glh

## 2017-12-29 DIAGNOSIS — K219 Gastro-esophageal reflux disease without esophagitis: Secondary | ICD-10-CM | POA: Diagnosis not present

## 2017-12-29 DIAGNOSIS — M5431 Sciatica, right side: Secondary | ICD-10-CM | POA: Diagnosis not present

## 2017-12-29 DIAGNOSIS — G4733 Obstructive sleep apnea (adult) (pediatric): Secondary | ICD-10-CM | POA: Diagnosis not present

## 2017-12-29 DIAGNOSIS — R69 Illness, unspecified: Secondary | ICD-10-CM | POA: Diagnosis not present

## 2017-12-29 DIAGNOSIS — I10 Essential (primary) hypertension: Secondary | ICD-10-CM | POA: Diagnosis not present

## 2017-12-29 DIAGNOSIS — M5416 Radiculopathy, lumbar region: Secondary | ICD-10-CM | POA: Diagnosis not present

## 2017-12-29 DIAGNOSIS — J449 Chronic obstructive pulmonary disease, unspecified: Secondary | ICD-10-CM | POA: Diagnosis not present

## 2017-12-29 DIAGNOSIS — E7800 Pure hypercholesterolemia, unspecified: Secondary | ICD-10-CM | POA: Diagnosis not present

## 2017-12-29 DIAGNOSIS — G8929 Other chronic pain: Secondary | ICD-10-CM | POA: Diagnosis not present

## 2017-12-29 NOTE — Telephone Encounter (Signed)
Good Afternoon [name], Please call Ms. Cartaya and advise her to continue her current warfarin therapy. Thanks! Valetta Fuller

## 2017-12-29 NOTE — Telephone Encounter (Signed)
Please advise.  T. Jerman Tinnon, CMA 

## 2017-12-31 ENCOUNTER — Other Ambulatory Visit: Payer: Self-pay | Admitting: Adult Health

## 2018-01-01 NOTE — Telephone Encounter (Signed)
Pt informed of results.  Pt expressed understanding and is agreeable.  T. Edita Weyenberg, CMA 

## 2018-01-02 DIAGNOSIS — G8929 Other chronic pain: Secondary | ICD-10-CM | POA: Diagnosis not present

## 2018-01-02 DIAGNOSIS — M5416 Radiculopathy, lumbar region: Secondary | ICD-10-CM | POA: Diagnosis not present

## 2018-01-02 DIAGNOSIS — I10 Essential (primary) hypertension: Secondary | ICD-10-CM | POA: Diagnosis not present

## 2018-01-02 DIAGNOSIS — E7800 Pure hypercholesterolemia, unspecified: Secondary | ICD-10-CM | POA: Diagnosis not present

## 2018-01-02 DIAGNOSIS — G4733 Obstructive sleep apnea (adult) (pediatric): Secondary | ICD-10-CM | POA: Diagnosis not present

## 2018-01-02 DIAGNOSIS — J449 Chronic obstructive pulmonary disease, unspecified: Secondary | ICD-10-CM | POA: Diagnosis not present

## 2018-01-02 DIAGNOSIS — R69 Illness, unspecified: Secondary | ICD-10-CM | POA: Diagnosis not present

## 2018-01-02 DIAGNOSIS — K219 Gastro-esophageal reflux disease without esophagitis: Secondary | ICD-10-CM | POA: Diagnosis not present

## 2018-01-02 DIAGNOSIS — M5431 Sciatica, right side: Secondary | ICD-10-CM | POA: Diagnosis not present

## 2018-01-03 DIAGNOSIS — M5416 Radiculopathy, lumbar region: Secondary | ICD-10-CM | POA: Diagnosis not present

## 2018-01-03 DIAGNOSIS — G4733 Obstructive sleep apnea (adult) (pediatric): Secondary | ICD-10-CM | POA: Diagnosis not present

## 2018-01-03 DIAGNOSIS — J449 Chronic obstructive pulmonary disease, unspecified: Secondary | ICD-10-CM | POA: Diagnosis not present

## 2018-01-03 DIAGNOSIS — I10 Essential (primary) hypertension: Secondary | ICD-10-CM | POA: Diagnosis not present

## 2018-01-03 DIAGNOSIS — G8929 Other chronic pain: Secondary | ICD-10-CM | POA: Diagnosis not present

## 2018-01-03 DIAGNOSIS — K219 Gastro-esophageal reflux disease without esophagitis: Secondary | ICD-10-CM | POA: Diagnosis not present

## 2018-01-03 DIAGNOSIS — E7800 Pure hypercholesterolemia, unspecified: Secondary | ICD-10-CM | POA: Diagnosis not present

## 2018-01-03 DIAGNOSIS — R69 Illness, unspecified: Secondary | ICD-10-CM | POA: Diagnosis not present

## 2018-01-03 DIAGNOSIS — M5431 Sciatica, right side: Secondary | ICD-10-CM | POA: Diagnosis not present

## 2018-01-04 DIAGNOSIS — J449 Chronic obstructive pulmonary disease, unspecified: Secondary | ICD-10-CM | POA: Diagnosis not present

## 2018-01-04 DIAGNOSIS — G8929 Other chronic pain: Secondary | ICD-10-CM | POA: Diagnosis not present

## 2018-01-04 DIAGNOSIS — M5416 Radiculopathy, lumbar region: Secondary | ICD-10-CM | POA: Diagnosis not present

## 2018-01-04 DIAGNOSIS — I10 Essential (primary) hypertension: Secondary | ICD-10-CM | POA: Diagnosis not present

## 2018-01-04 DIAGNOSIS — M5431 Sciatica, right side: Secondary | ICD-10-CM | POA: Diagnosis not present

## 2018-01-04 DIAGNOSIS — R69 Illness, unspecified: Secondary | ICD-10-CM | POA: Diagnosis not present

## 2018-01-04 DIAGNOSIS — G4733 Obstructive sleep apnea (adult) (pediatric): Secondary | ICD-10-CM | POA: Diagnosis not present

## 2018-01-04 DIAGNOSIS — K219 Gastro-esophageal reflux disease without esophagitis: Secondary | ICD-10-CM | POA: Diagnosis not present

## 2018-01-04 DIAGNOSIS — E7800 Pure hypercholesterolemia, unspecified: Secondary | ICD-10-CM | POA: Diagnosis not present

## 2018-01-05 DIAGNOSIS — Z0000 Encounter for general adult medical examination without abnormal findings: Secondary | ICD-10-CM | POA: Diagnosis not present

## 2018-01-05 DIAGNOSIS — G8929 Other chronic pain: Secondary | ICD-10-CM | POA: Diagnosis not present

## 2018-01-05 DIAGNOSIS — Z7901 Long term (current) use of anticoagulants: Secondary | ICD-10-CM | POA: Diagnosis not present

## 2018-01-05 DIAGNOSIS — G894 Chronic pain syndrome: Secondary | ICD-10-CM | POA: Diagnosis not present

## 2018-01-05 DIAGNOSIS — E559 Vitamin D deficiency, unspecified: Secondary | ICD-10-CM | POA: Diagnosis not present

## 2018-01-05 DIAGNOSIS — I10 Essential (primary) hypertension: Secondary | ICD-10-CM | POA: Diagnosis not present

## 2018-01-08 ENCOUNTER — Ambulatory Visit
Admission: RE | Admit: 2018-01-08 | Discharge: 2018-01-08 | Disposition: A | Discharge status: Home / Self Care | Payer: Medicare HMO | Source: Ambulatory Visit | Attending: Adult Health | Admitting: Adult Health

## 2018-01-08 DIAGNOSIS — Z1231 Encounter for screening mammogram for malignant neoplasm of breast: Secondary | ICD-10-CM | POA: Diagnosis not present

## 2018-01-10 DIAGNOSIS — M5431 Sciatica, right side: Secondary | ICD-10-CM | POA: Diagnosis not present

## 2018-01-10 DIAGNOSIS — R69 Illness, unspecified: Secondary | ICD-10-CM | POA: Diagnosis not present

## 2018-01-10 DIAGNOSIS — G8929 Other chronic pain: Secondary | ICD-10-CM | POA: Diagnosis not present

## 2018-01-10 DIAGNOSIS — K219 Gastro-esophageal reflux disease without esophagitis: Secondary | ICD-10-CM | POA: Diagnosis not present

## 2018-01-10 DIAGNOSIS — I10 Essential (primary) hypertension: Secondary | ICD-10-CM | POA: Diagnosis not present

## 2018-01-10 DIAGNOSIS — M5416 Radiculopathy, lumbar region: Secondary | ICD-10-CM | POA: Diagnosis not present

## 2018-01-10 DIAGNOSIS — J449 Chronic obstructive pulmonary disease, unspecified: Secondary | ICD-10-CM | POA: Diagnosis not present

## 2018-01-10 DIAGNOSIS — E7800 Pure hypercholesterolemia, unspecified: Secondary | ICD-10-CM | POA: Diagnosis not present

## 2018-01-10 DIAGNOSIS — G4733 Obstructive sleep apnea (adult) (pediatric): Secondary | ICD-10-CM | POA: Diagnosis not present

## 2018-01-13 DIAGNOSIS — M179 Osteoarthritis of knee, unspecified: Secondary | ICD-10-CM | POA: Diagnosis not present

## 2018-01-13 DIAGNOSIS — J449 Chronic obstructive pulmonary disease, unspecified: Secondary | ICD-10-CM | POA: Diagnosis not present

## 2018-01-13 DIAGNOSIS — M5416 Radiculopathy, lumbar region: Secondary | ICD-10-CM | POA: Diagnosis not present

## 2018-01-16 DIAGNOSIS — E7800 Pure hypercholesterolemia, unspecified: Secondary | ICD-10-CM | POA: Diagnosis not present

## 2018-01-16 DIAGNOSIS — J449 Chronic obstructive pulmonary disease, unspecified: Secondary | ICD-10-CM | POA: Diagnosis not present

## 2018-01-16 DIAGNOSIS — M5431 Sciatica, right side: Secondary | ICD-10-CM | POA: Diagnosis not present

## 2018-01-16 DIAGNOSIS — G8929 Other chronic pain: Secondary | ICD-10-CM | POA: Diagnosis not present

## 2018-01-16 DIAGNOSIS — R69 Illness, unspecified: Secondary | ICD-10-CM | POA: Diagnosis not present

## 2018-01-16 DIAGNOSIS — M5416 Radiculopathy, lumbar region: Secondary | ICD-10-CM | POA: Diagnosis not present

## 2018-01-16 DIAGNOSIS — I10 Essential (primary) hypertension: Secondary | ICD-10-CM | POA: Diagnosis not present

## 2018-01-16 DIAGNOSIS — K219 Gastro-esophageal reflux disease without esophagitis: Secondary | ICD-10-CM | POA: Diagnosis not present

## 2018-01-16 DIAGNOSIS — G4733 Obstructive sleep apnea (adult) (pediatric): Secondary | ICD-10-CM | POA: Diagnosis not present

## 2018-01-18 DIAGNOSIS — I10 Essential (primary) hypertension: Secondary | ICD-10-CM | POA: Diagnosis not present

## 2018-01-18 DIAGNOSIS — E7800 Pure hypercholesterolemia, unspecified: Secondary | ICD-10-CM | POA: Diagnosis not present

## 2018-01-18 DIAGNOSIS — R69 Illness, unspecified: Secondary | ICD-10-CM | POA: Diagnosis not present

## 2018-01-18 DIAGNOSIS — G4733 Obstructive sleep apnea (adult) (pediatric): Secondary | ICD-10-CM | POA: Diagnosis not present

## 2018-01-18 DIAGNOSIS — J449 Chronic obstructive pulmonary disease, unspecified: Secondary | ICD-10-CM | POA: Diagnosis not present

## 2018-01-18 DIAGNOSIS — G8929 Other chronic pain: Secondary | ICD-10-CM | POA: Diagnosis not present

## 2018-01-18 DIAGNOSIS — M5416 Radiculopathy, lumbar region: Secondary | ICD-10-CM | POA: Diagnosis not present

## 2018-01-18 DIAGNOSIS — M5431 Sciatica, right side: Secondary | ICD-10-CM | POA: Diagnosis not present

## 2018-01-18 DIAGNOSIS — K219 Gastro-esophageal reflux disease without esophagitis: Secondary | ICD-10-CM | POA: Diagnosis not present

## 2018-01-24 ENCOUNTER — Telehealth: Payer: Self-pay

## 2018-01-24 ENCOUNTER — Other Ambulatory Visit: Payer: Self-pay | Admitting: Adult Health

## 2018-01-24 NOTE — Telephone Encounter (Signed)
Received Epic notification that pt has not read MyChart message regarding results.     Mammogram results   From Fonnie Mu, CMA To MATHILDA MAGUIRE Sent 01/10/2018 10:59 AM  Dear [name] asked that I share that your mammogram is normal and that you will need to repeat in one year.   Wishing you well,  Kenney Houseman, CMA for  Mina [name], NP   [address]   MyChart User Last Read On  TAJHA SAMMARCO Not Read     Pt informed of results.  Pt expressed understanding and is agreeable.  Charyl Bigger, CMA

## 2018-01-25 DIAGNOSIS — I10 Essential (primary) hypertension: Secondary | ICD-10-CM | POA: Diagnosis not present

## 2018-01-25 DIAGNOSIS — J449 Chronic obstructive pulmonary disease, unspecified: Secondary | ICD-10-CM | POA: Diagnosis not present

## 2018-01-25 DIAGNOSIS — E7800 Pure hypercholesterolemia, unspecified: Secondary | ICD-10-CM | POA: Diagnosis not present

## 2018-01-25 DIAGNOSIS — G8929 Other chronic pain: Secondary | ICD-10-CM | POA: Diagnosis not present

## 2018-01-25 DIAGNOSIS — G4733 Obstructive sleep apnea (adult) (pediatric): Secondary | ICD-10-CM | POA: Diagnosis not present

## 2018-01-25 DIAGNOSIS — R69 Illness, unspecified: Secondary | ICD-10-CM | POA: Diagnosis not present

## 2018-01-25 DIAGNOSIS — K219 Gastro-esophageal reflux disease without esophagitis: Secondary | ICD-10-CM | POA: Diagnosis not present

## 2018-01-25 DIAGNOSIS — M5431 Sciatica, right side: Secondary | ICD-10-CM | POA: Diagnosis not present

## 2018-01-25 DIAGNOSIS — M5416 Radiculopathy, lumbar region: Secondary | ICD-10-CM | POA: Diagnosis not present

## 2018-02-01 ENCOUNTER — Emergency Department (HOSPITAL_COMMUNITY)
Admission: EM | Admit: 2018-02-01 | Discharge: 2018-02-02 | Disposition: A | Discharge status: Home / Self Care | Payer: Medicare HMO | Attending: Emergency Medicine | Admitting: Emergency Medicine

## 2018-02-01 DIAGNOSIS — M545 Low back pain, unspecified: Secondary | ICD-10-CM

## 2018-02-01 DIAGNOSIS — Z7982 Long term (current) use of aspirin: Secondary | ICD-10-CM | POA: Insufficient documentation

## 2018-02-01 DIAGNOSIS — I10 Essential (primary) hypertension: Secondary | ICD-10-CM | POA: Insufficient documentation

## 2018-02-01 DIAGNOSIS — Z79899 Other long term (current) drug therapy: Secondary | ICD-10-CM | POA: Diagnosis not present

## 2018-02-01 DIAGNOSIS — G8929 Other chronic pain: Secondary | ICD-10-CM

## 2018-02-01 DIAGNOSIS — Z91040 Latex allergy status: Secondary | ICD-10-CM | POA: Insufficient documentation

## 2018-02-01 DIAGNOSIS — J449 Chronic obstructive pulmonary disease, unspecified: Secondary | ICD-10-CM | POA: Insufficient documentation

## 2018-02-01 NOTE — ED Triage Notes (Signed)
Pt to ED with  C/o pain in right mid back  onset 3 days ago worse today.  Pt st's pain meds not working.

## 2018-02-02 ENCOUNTER — Other Ambulatory Visit: Payer: Self-pay

## 2018-02-02 ENCOUNTER — Ambulatory Visit: Payer: Medicare HMO | Admitting: Cardiovascular Disease

## 2018-02-02 ENCOUNTER — Encounter (HOSPITAL_COMMUNITY): Payer: Self-pay | Admitting: Emergency Medicine

## 2018-02-02 DIAGNOSIS — Z7982 Long term (current) use of aspirin: Secondary | ICD-10-CM | POA: Diagnosis not present

## 2018-02-02 DIAGNOSIS — Z79899 Other long term (current) drug therapy: Secondary | ICD-10-CM | POA: Diagnosis not present

## 2018-02-02 DIAGNOSIS — I10 Essential (primary) hypertension: Secondary | ICD-10-CM | POA: Diagnosis not present

## 2018-02-02 DIAGNOSIS — J449 Chronic obstructive pulmonary disease, unspecified: Secondary | ICD-10-CM | POA: Diagnosis not present

## 2018-02-02 DIAGNOSIS — Z91040 Latex allergy status: Secondary | ICD-10-CM | POA: Diagnosis not present

## 2018-02-02 DIAGNOSIS — M545 Low back pain: Secondary | ICD-10-CM | POA: Diagnosis not present

## 2018-02-02 MED ORDER — ONDANSETRON 8 MG PO TBDP: ORAL_TABLET | ORAL | 0 refills | Status: DC

## 2018-02-02 MED ORDER — OXYCODONE-ACETAMINOPHEN 10-325 MG PO TABS: 1.0000 | ORAL_TABLET | Freq: Four times a day (QID) | ORAL | 0 refills | Status: DC | PRN

## 2018-02-02 MED ORDER — ONDANSETRON 4 MG PO TBDP
8.0000 mg | ORAL_TABLET | Freq: Once | ORAL | Status: AC
  Administered 2018-02-02: 8 mg via ORAL
  Filled 2018-02-02: qty 2

## 2018-02-02 MED ORDER — KETOROLAC TROMETHAMINE 60 MG/2ML IM SOLN
60.0000 mg | Freq: Once | INTRAMUSCULAR | Status: AC
Start: 2018-02-02 — End: 2018-02-02
  Administered 2018-02-02: 60 mg via INTRAMUSCULAR
  Filled 2018-02-02: qty 2

## 2018-02-02 MED ORDER — HYDROMORPHONE HCL 1 MG/ML IJ SOLN
2.0000 mg | Freq: Once | INTRAMUSCULAR | Status: AC
  Administered 2018-02-02: 2 mg via INTRAMUSCULAR
  Filled 2018-02-02: qty 2

## 2018-02-02 NOTE — Discharge Instructions (Addendum)
Percocet as prescribed as needed for pain.  Zofran as prescribed as needed for nausea.  Follow-up with your neurosurgeon next week if symptoms are not improving, and return to the ER if symptoms significantly worsen or change.

## 2018-02-02 NOTE — ED Notes (Signed)
..  Patient verbalizes understanding of discharge instructions. Opportunity for questioning and answers were provided. Armband removed by staff, pt discharged from ED with family members .
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
## 2018-02-02 NOTE — ED Notes (Signed)
Pt d/c by ANOTHER  RN

## 2018-02-05 ENCOUNTER — Telehealth: Payer: Self-pay | Admitting: Adult Health

## 2018-02-05 ENCOUNTER — Encounter: Payer: Self-pay | Admitting: Physical Medicine & Rehabilitation

## 2018-02-05 NOTE — Telephone Encounter (Signed)
Patient was inquiring when Referral to Pain Specialist to be set.  ---Patient request a call back at [phone]

## 2018-02-08 ENCOUNTER — Telehealth: Payer: Self-pay | Admitting: Adult Health

## 2018-02-08 DIAGNOSIS — K219 Gastro-esophageal reflux disease without esophagitis: Secondary | ICD-10-CM | POA: Diagnosis not present

## 2018-02-08 DIAGNOSIS — R69 Illness, unspecified: Secondary | ICD-10-CM | POA: Diagnosis not present

## 2018-02-08 DIAGNOSIS — M5431 Sciatica, right side: Secondary | ICD-10-CM | POA: Diagnosis not present

## 2018-02-08 DIAGNOSIS — M5416 Radiculopathy, lumbar region: Secondary | ICD-10-CM | POA: Diagnosis not present

## 2018-02-08 DIAGNOSIS — G4733 Obstructive sleep apnea (adult) (pediatric): Secondary | ICD-10-CM | POA: Diagnosis not present

## 2018-02-08 DIAGNOSIS — I10 Essential (primary) hypertension: Secondary | ICD-10-CM | POA: Diagnosis not present

## 2018-02-08 DIAGNOSIS — J449 Chronic obstructive pulmonary disease, unspecified: Secondary | ICD-10-CM | POA: Diagnosis not present

## 2018-02-08 DIAGNOSIS — E7800 Pure hypercholesterolemia, unspecified: Secondary | ICD-10-CM | POA: Diagnosis not present

## 2018-02-08 DIAGNOSIS — G8929 Other chronic pain: Secondary | ICD-10-CM | POA: Diagnosis not present

## 2018-02-08 NOTE — Telephone Encounter (Signed)
[facility] attendant/ Nurse Benjamine Mola) called back to request authorization for a Urine Analysis / collection, for possible UTI----    --- Pls call her at [phone] to give permission. --glh

## 2018-02-08 NOTE — Telephone Encounter (Signed)
Advised [name] with [facility] that pt would need to be seen at ED because PCP is out of the office until 02/12/18 and would not be available to review any lab results or write for pain meds.  Also advised [name] that PCP does not manage the pt's pain and that the Pain Clinic would need to prescribe any medications.  [name] also requested verbal orders to continue services.  Verbal order given.  Charyl Bigger, CMA

## 2018-02-08 NOTE — Telephone Encounter (Signed)
Advised Benjamine Mola with [facility] that pt would need to be seen at ED because PCP is out of the office until 02/12/18 and would not be available to review any lab results or write for pain meds.  Also advised Benjamine Mola that PCP does not manage the pt's pain and that the Pain Clinic would need to prescribe any medications.  Benjamine Mola also requested verbal orders to continue services.  Verbal order given.  Charyl Bigger, CMA

## 2018-02-08 NOTE — Telephone Encounter (Signed)
Home health nurse Benjamine Mola called on behalf of patient about chronic pain. Patient can't seem to get ahead of the pain and wants to see what we can do, she is already sch with the [facility] on 5/13. Benjamine Mola can be reached at [phone]

## 2018-02-09 DIAGNOSIS — M545 Low back pain: Secondary | ICD-10-CM | POA: Diagnosis not present

## 2018-02-09 DIAGNOSIS — G8929 Other chronic pain: Secondary | ICD-10-CM | POA: Diagnosis not present

## 2018-02-09 DIAGNOSIS — R079 Chest pain, unspecified: Secondary | ICD-10-CM | POA: Diagnosis not present

## 2018-02-09 DIAGNOSIS — D509 Iron deficiency anemia, unspecified: Secondary | ICD-10-CM | POA: Diagnosis not present

## 2018-02-09 DIAGNOSIS — K5730 Diverticulosis of large intestine without perforation or abscess without bleeding: Secondary | ICD-10-CM | POA: Diagnosis not present

## 2018-02-13 DIAGNOSIS — J449 Chronic obstructive pulmonary disease, unspecified: Secondary | ICD-10-CM | POA: Diagnosis not present

## 2018-02-13 DIAGNOSIS — M5416 Radiculopathy, lumbar region: Secondary | ICD-10-CM | POA: Diagnosis not present

## 2018-02-13 DIAGNOSIS — M179 Osteoarthritis of knee, unspecified: Secondary | ICD-10-CM | POA: Diagnosis not present

## 2018-02-20 DIAGNOSIS — R69 Illness, unspecified: Secondary | ICD-10-CM | POA: Diagnosis not present

## 2018-02-20 DIAGNOSIS — G4733 Obstructive sleep apnea (adult) (pediatric): Secondary | ICD-10-CM | POA: Diagnosis not present

## 2018-02-20 DIAGNOSIS — J449 Chronic obstructive pulmonary disease, unspecified: Secondary | ICD-10-CM | POA: Diagnosis not present

## 2018-02-20 DIAGNOSIS — G8929 Other chronic pain: Secondary | ICD-10-CM | POA: Diagnosis not present

## 2018-02-20 DIAGNOSIS — E7800 Pure hypercholesterolemia, unspecified: Secondary | ICD-10-CM | POA: Diagnosis not present

## 2018-02-20 DIAGNOSIS — M5416 Radiculopathy, lumbar region: Secondary | ICD-10-CM | POA: Diagnosis not present

## 2018-02-20 DIAGNOSIS — I10 Essential (primary) hypertension: Secondary | ICD-10-CM | POA: Diagnosis not present

## 2018-02-20 DIAGNOSIS — K219 Gastro-esophageal reflux disease without esophagitis: Secondary | ICD-10-CM | POA: Diagnosis not present

## 2018-02-20 DIAGNOSIS — M5431 Sciatica, right side: Secondary | ICD-10-CM | POA: Diagnosis not present

## 2018-02-26 DIAGNOSIS — M5416 Radiculopathy, lumbar region: Secondary | ICD-10-CM | POA: Diagnosis not present

## 2018-02-26 DIAGNOSIS — R69 Illness, unspecified: Secondary | ICD-10-CM | POA: Diagnosis not present

## 2018-02-26 DIAGNOSIS — G8929 Other chronic pain: Secondary | ICD-10-CM | POA: Diagnosis not present

## 2018-02-26 DIAGNOSIS — K219 Gastro-esophageal reflux disease without esophagitis: Secondary | ICD-10-CM | POA: Diagnosis not present

## 2018-02-26 DIAGNOSIS — J449 Chronic obstructive pulmonary disease, unspecified: Secondary | ICD-10-CM | POA: Diagnosis not present

## 2018-02-26 DIAGNOSIS — G4733 Obstructive sleep apnea (adult) (pediatric): Secondary | ICD-10-CM | POA: Diagnosis not present

## 2018-02-26 DIAGNOSIS — E7800 Pure hypercholesterolemia, unspecified: Secondary | ICD-10-CM | POA: Diagnosis not present

## 2018-02-26 DIAGNOSIS — M5431 Sciatica, right side: Secondary | ICD-10-CM | POA: Diagnosis not present

## 2018-02-26 DIAGNOSIS — I10 Essential (primary) hypertension: Secondary | ICD-10-CM | POA: Diagnosis not present

## 2018-03-05 ENCOUNTER — Ambulatory Visit: Payer: Medicare HMO | Admitting: Physical Medicine & Rehabilitation

## 2018-03-07 ENCOUNTER — Ambulatory Visit (INDEPENDENT_AMBULATORY_CARE_PROVIDER_SITE_OTHER): Payer: Medicare HMO | Admitting: Adult Health

## 2018-03-07 ENCOUNTER — Encounter: Payer: Self-pay | Admitting: Adult Health

## 2018-03-07 VITALS — BP 120/79 | HR 95 | Ht 63.0 in | Wt 269.0 lb

## 2018-03-07 DIAGNOSIS — M545 Low back pain, unspecified: Secondary | ICD-10-CM

## 2018-03-07 DIAGNOSIS — E785 Hyperlipidemia, unspecified: Secondary | ICD-10-CM

## 2018-03-07 DIAGNOSIS — R739 Hyperglycemia, unspecified: Secondary | ICD-10-CM | POA: Diagnosis not present

## 2018-03-07 DIAGNOSIS — I10 Essential (primary) hypertension: Secondary | ICD-10-CM | POA: Diagnosis not present

## 2018-03-07 DIAGNOSIS — Z7409 Other reduced mobility: Secondary | ICD-10-CM

## 2018-03-07 DIAGNOSIS — J449 Chronic obstructive pulmonary disease, unspecified: Secondary | ICD-10-CM | POA: Diagnosis not present

## 2018-03-07 DIAGNOSIS — M5416 Radiculopathy, lumbar region: Secondary | ICD-10-CM | POA: Diagnosis not present

## 2018-03-07 DIAGNOSIS — Z9229 Personal history of other drug therapy: Secondary | ICD-10-CM

## 2018-03-07 DIAGNOSIS — Z0000 Encounter for general adult medical examination without abnormal findings: Secondary | ICD-10-CM | POA: Diagnosis not present

## 2018-03-07 MED ORDER — AMBULATORY NON FORMULARY MEDICATION
0 refills | Status: DC
Start: 2018-03-07 — End: 2018-04-10

## 2018-03-07 MED ORDER — ALBUTEROL SULFATE HFA 108 (90 BASE) MCG/ACT IN AERS: 2.0000 | INHALATION_SPRAY | Freq: Four times a day (QID) | RESPIRATORY_TRACT | 0 refills | Status: DC | PRN

## 2018-03-07 MED ORDER — METHOCARBAMOL 500 MG PO TABS: 500.0000 mg | ORAL_TABLET | Freq: Four times a day (QID) | ORAL | 1 refills | Status: DC | PRN

## 2018-03-07 MED ORDER — ISOMETHEPTENE-DICHLORAL-APAP 65-100-325 MG PO CAPS: 1.0000 | ORAL_CAPSULE | Freq: Four times a day (QID) | ORAL | 0 refills | Status: DC | PRN

## 2018-03-07 MED ORDER — SERTRALINE HCL 50 MG PO TABS: ORAL_TABLET | ORAL | 3 refills | Status: DC

## 2018-03-07 MED ORDER — WARFARIN SODIUM 5 MG PO TABS: ORAL_TABLET | ORAL | 1 refills | Status: DC

## 2018-03-07 NOTE — Assessment & Plan Note (Signed)
  1) Please continue all medications as directed. 2) Please call Pain Clinic and be placed on "cancellation list" to perhaps get an earlier appt. 3) Please start Sertraline 50mg - 1/2 tablet daily for the first week then titrate up to one full tablet. 4) Please continue checking your INR at home and providing information to the [facility] and our clinic. Last INR 02/27/18 was 2.9, coumadin refilled at current therapy 5) Kenney Houseman will review what she learned from [facility] and [employer] 6) We will call you when your lab results are available, we will adjust statin is necessary or refill current dosage at this time. 7) Keep your Cardiology appt. 8) Follow-up here in 3 months. Thinking of you and your family during your father's illness.

## 2018-03-07 NOTE — Addendum Note (Signed)
Addended by: Fonnie Mu on: 03/07/2018 03:32 PM   Modules accepted: Orders

## 2018-03-07 NOTE — Patient Instructions (Addendum)
Preventive Care for Adults, Female  A healthy lifestyle and preventive care can promote health and wellness. Preventive health guidelines for women include the following key practices.   A routine yearly physical is a good way to check with your health care provider about your health and preventive screening. It is a chance to share any concerns and updates on your health and to receive a thorough exam.   Visit your dentist for a routine exam and preventive care every 6 months. Brush your teeth twice a day and floss once a day. Good oral hygiene prevents tooth decay and gum disease.   The frequency of eye exams is based on your age, health, family medical history, use of contact lenses, and other factors. Follow your health care provider's recommendations for frequency of eye exams.   Eat a healthy diet. Foods like vegetables, fruits, whole grains, low-fat dairy products, and lean protein foods contain the nutrients you need without too many calories. Decrease your intake of foods high in solid fats, added sugars, and salt. Eat the right amount of calories for you.Get information about a proper diet from your health care provider, if necessary.   Regular physical exercise is one of the most important things you can do for your health. Most adults should get at least 150 minutes of moderate-intensity exercise (any activity that increases your heart rate and causes you to sweat) each week. In addition, most adults need muscle-strengthening exercises on 2 or more days a week.   Maintain a healthy weight. The body mass index (BMI) is a screening tool to identify possible weight problems. It provides an estimate of body fat based on height and weight. Your health care provider can find your BMI, and can help you achieve or maintain a healthy weight.For adults 20 years and older:   - A BMI below 18.5 is considered underweight.   - A BMI of 18.5 to 24.9 is normal.   - A BMI of 25 to 29.9 is  considered overweight.   - A BMI of 30 and above is considered obese.   Maintain normal blood lipids and cholesterol levels by exercising and minimizing your intake of trans and saturated fats.  Eat a balanced diet with plenty of fruit and vegetables. Blood tests for lipids and cholesterol should begin at age 20 and be repeated every 5 years minimum.  If your lipid or cholesterol levels are high, you are over 40, or you are at high risk for heart disease, you may need your cholesterol levels checked more frequently.Ongoing high lipid and cholesterol levels should be treated with medicines if diet and exercise are not working.   If you smoke, find out from your health care provider how to quit. If you do not use tobacco, do not start.   Lung cancer screening is recommended for adults aged 55-80 years who are at high risk for developing lung cancer because of a history of smoking. A yearly low-dose CT scan of the lungs is recommended for people who have at least a 30-pack-year history of smoking and are a current smoker or have quit within the past 15 years. A pack year of smoking is smoking an average of 1 pack of cigarettes a day for 1 year (for example: 1 pack a day for 30 years or 2 packs a day for 15 years). Yearly screening should continue until the smoker has stopped smoking for at least 15 years. Yearly screening should be stopped for people who develop a   health problem that would prevent them from having lung cancer treatment.   If you are pregnant, do not drink alcohol. If you are breastfeeding, be very cautious about drinking alcohol. If you are not pregnant and choose to drink alcohol, do not have more than 1 drink per day. One drink is considered to be 12 ounces (355 mL) of beer, 5 ounces (148 mL) of wine, or 1.5 ounces (44 mL) of liquor.   Avoid use of street drugs. Do not share needles with anyone. Ask for help if you need support or instructions about stopping the use of  drugs.   High blood pressure causes heart disease and increases the risk of stroke. Your blood pressure should be checked at least yearly.  Ongoing high blood pressure should be treated with medicines if weight loss and exercise do not work.   If you are 69-55 years old, ask your health care provider if you should take aspirin to prevent strokes.   Diabetes screening involves taking a blood sample to check your fasting blood sugar level. This should be done once every 3 years, after age 38, if you are within normal weight and without risk factors for diabetes. Testing should be considered at a younger age or be carried out more frequently if you are overweight and have at least 1 risk factor for diabetes.   Breast cancer screening is essential preventive care for women. You should practice "breast self-awareness."  This means understanding the normal appearance and feel of your breasts and may include breast self-examination.  Any changes detected, no matter how small, should be reported to a health care provider.  Women in their 80s and 30s should have a clinical breast exam (CBE) by a health care provider as part of a regular health exam every 1 to 3 years.  After age 66, women should have a CBE every year.  Starting at age 1, women should consider having a mammogram (breast X-ray test) every year.  Women who have a family history of breast cancer should talk to their health care provider about genetic screening.  Women at a high risk of breast cancer should talk to their health care providers about having an MRI and a mammogram every year.   -Breast cancer gene (BRCA)-related cancer risk assessment is recommended for women who have family members with BRCA-related cancers. BRCA-related cancers include breast, ovarian, tubal, and peritoneal cancers. Having family members with these cancers may be associated with an increased risk for harmful changes (mutations) in the breast cancer genes BRCA1 and  BRCA2. Results of the assessment will determine the need for genetic counseling and BRCA1 and BRCA2 testing.   The Pap test is a screening test for cervical cancer. A Pap test can show cell changes on the cervix that might become cervical cancer if left untreated. A Pap test is a procedure in which cells are obtained and examined from the lower end of the uterus (cervix).   - Women should have a Pap test starting at age 57.   - Between ages 90 and 70, Pap tests should be repeated every 2 years.   - Beginning at age 63, you should have a Pap test every 3 years as long as the past 3 Pap tests have been normal.   - Some women have medical problems that increase the chance of getting cervical cancer. Talk to your health care provider about these problems. It is especially important to talk to your health care provider if a  new problem develops soon after your last Pap test. In these cases, your health care provider may recommend more frequent screening and Pap tests.   - The above recommendations are the same for women who have or have not gotten the vaccine for human papillomavirus (HPV).   - If you had a hysterectomy for a problem that was not cancer or a condition that could lead to cancer, then you no longer need Pap tests. Even if you no longer need a Pap test, a regular exam is a good idea to make sure no other problems are starting.   - If you are between ages 36 and 66 years, and you have had normal Pap tests going back 10 years, you no longer need Pap tests. Even if you no longer need a Pap test, a regular exam is a good idea to make sure no other problems are starting.   - If you have had past treatment for cervical cancer or a condition that could lead to cancer, you need Pap tests and screening for cancer for at least 20 years after your treatment.   - If Pap tests have been discontinued, risk factors (such as a new sexual partner) need to be reassessed to determine if screening should  be resumed.   - The HPV test is an additional test that may be used for cervical cancer screening. The HPV test looks for the virus that can cause the cell changes on the cervix. The cells collected during the Pap test can be tested for HPV. The HPV test could be used to screen women aged 70 years and older, and should be used in women of any age who have unclear Pap test results. After the age of 67, women should have HPV testing at the same frequency as a Pap test.   Colorectal cancer can be detected and often prevented. Most routine colorectal cancer screening begins at the age of 57 years and continues through age 26 years. However, your health care provider may recommend screening at an earlier age if you have risk factors for colon cancer. On a yearly basis, your health care provider may provide home test kits to check for hidden blood in the stool.  Use of a small camera at the end of a tube, to directly examine the colon (sigmoidoscopy or colonoscopy), can detect the earliest forms of colorectal cancer. Talk to your health care provider about this at age 23, when routine screening begins. Direct exam of the colon should be repeated every 5 -10 years through age 49 years, unless early forms of pre-cancerous polyps or small growths are found.   People who are at an increased risk for hepatitis B should be screened for this virus. You are considered at high risk for hepatitis B if:  -You were born in a country where hepatitis B occurs often. Talk with your health care provider about which countries are considered high risk.  - Your parents were born in a high-risk country and you have not received a shot to protect against hepatitis B (hepatitis B vaccine).  - You have HIV or AIDS.  - You use needles to inject street drugs.  - You live with, or have sex with, someone who has Hepatitis B.  - You get hemodialysis treatment.  - You take certain medicines for conditions like cancer, organ  transplantation, and autoimmune conditions.   Hepatitis C blood testing is recommended for all people born from [birth year] through 1965 and any individual  with known risks for hepatitis C.   Practice safe sex. Use condoms and avoid high-risk sexual practices to reduce the spread of sexually transmitted infections (STIs). STIs include gonorrhea, chlamydia, syphilis, trichomonas, herpes, HPV, and human immunodeficiency virus (HIV). Herpes, HIV, and HPV are viral illnesses that have no cure. They can result in disability, cancer, and death. Sexually active women aged 25 years and younger should be checked for chlamydia. Older women with new or multiple partners should also be tested for chlamydia. Testing for other STIs is recommended if you are sexually active and at increased risk.   Osteoporosis is a disease in which the bones lose minerals and strength with aging. This can result in serious bone fractures or breaks. The risk of osteoporosis can be identified using a bone density scan. Women ages 65 years and over and women at risk for fractures or osteoporosis should discuss screening with their health care providers. Ask your health care provider whether you should take a calcium supplement or vitamin D to There are also several preventive steps women can take to avoid osteoporosis and resulting fractures or to keep osteoporosis from worsening. -->Recommendations include:  Eat a balanced diet high in fruits, vegetables, calcium, and vitamins.  Get enough calcium. The recommended total intake of is 1,200 mg daily; for best absorption, if taking supplements, divide doses into 250-500 mg doses throughout the day. Of the two types of calcium, calcium carbonate is best absorbed when taken with food but calcium citrate can be taken on an empty stomach.  Get enough vitamin D. NAMS and the National Osteoporosis Foundation recommend at least 1,000 IU per day for women age 50 and over who are at risk of vitamin D  deficiency. Vitamin D deficiency can be caused by inadequate sun exposure (for example, those who live in northern latitudes).  Avoid alcohol and smoking. Heavy alcohol intake (more than 7 drinks per week) increases the risk of falls and hip fracture and women smokers tend to lose bone more rapidly and have lower bone mass than nonsmokers. Stopping smoking is one of the most important changes women can make to improve their health and decrease risk for disease.  Be physically active every day. Weight-bearing exercise (for example, fast walking, hiking, jogging, and weight training) may strengthen bones or slow the rate of bone loss that comes with aging. Balancing and muscle-strengthening exercises can reduce the risk of falling and fracture.  Consider therapeutic medications. Currently, several types of effective drugs are available. Healthcare providers can recommend the type most appropriate for each woman.  Eliminate environmental factors that may contribute to accidents. Falls cause nearly 90% of all osteoporotic fractures, so reducing this risk is an important bone-health strategy. Measures include ample lighting, removing obstructions to walking, using nonskid rugs on floors, and placing mats and/or grab bars in showers.  Be aware of medication side effects. Some common medicines make bones weaker. These include a type of steroid drug called glucocorticoids used for arthritis and asthma, some antiseizure drugs, certain sleeping pills, treatments for endometriosis, and some cancer drugs. An overactive thyroid gland or using too much thyroid hormone for an underactive thyroid can also be a problem. If you are taking these medicines, talk to your doctor about what you can do to help protect your bones.reduce the rate of osteoporosis.    Menopause can be associated with physical symptoms and risks. Hormone replacement therapy is available to decrease symptoms and risks. You should talk to your  health care provider   about whether hormone replacement therapy is right for you.   Use sunscreen. Apply sunscreen liberally and repeatedly throughout the day. You should seek shade when your shadow is shorter than you. Protect yourself by wearing long sleeves, pants, a wide-brimmed hat, and sunglasses year round, whenever you are outdoors.   Once a month, do a whole body skin exam, using a mirror to look at the skin on your back. Tell your health care provider of new moles, moles that have irregular borders, moles that are larger than a pencil eraser, or moles that have changed in shape or color.   -Stay current with required vaccines (immunizations).   Influenza vaccine. All adults should be immunized every year.  Tetanus, diphtheria, and acellular pertussis (Td, Tdap) vaccine. Pregnant women should receive 1 dose of Tdap vaccine during each pregnancy. The dose should be obtained regardless of the length of time since the last dose. Immunization is preferred during the 27th 36th week of gestation. An adult who has not previously received Tdap or who does not know her vaccine status should receive 1 dose of Tdap. This initial dose should be followed by tetanus and diphtheria toxoids (Td) booster doses every 10 years. Adults with an unknown or incomplete history of completing a 3-dose immunization series with Td-containing vaccines should begin or complete a primary immunization series including a Tdap dose. Adults should receive a Td booster every 10 years.  Varicella vaccine. An adult without evidence of immunity to varicella should receive 2 doses or a second dose if she has previously received 1 dose. Pregnant females who do not have evidence of immunity should receive the first dose after pregnancy. This first dose should be obtained before leaving the health care facility. The second dose should be obtained 4 8 weeks after the first dose.  Human papillomavirus (HPV) vaccine. Females aged 13 26  years who have not received the vaccine previously should obtain the 3-dose series. The vaccine is not recommended for use in pregnant females. However, pregnancy testing is not needed before receiving a dose. If a female is found to be pregnant after receiving a dose, no treatment is needed. In that case, the remaining doses should be delayed until after the pregnancy. Immunization is recommended for any person with an immunocompromised condition through the age of 26 years if she did not get any or all doses earlier. During the 3-dose series, the second dose should be obtained 4 8 weeks after the first dose. The third dose should be obtained 24 weeks after the first dose and 16 weeks after the second dose.  Zoster vaccine. One dose is recommended for adults aged 60 years or older unless certain conditions are present.  Measles, mumps, and rubella (MMR) vaccine. Adults born before 1957 generally are considered immune to measles and mumps. Adults born in 1957 or later should have 1 or more doses of MMR vaccine unless there is a contraindication to the vaccine or there is laboratory evidence of immunity to each of the three diseases. A routine second dose of MMR vaccine should be obtained at least 28 days after the first dose for students attending postsecondary schools, health care workers, or international travelers. People who received inactivated measles vaccine or an unknown type of measles vaccine during 1963 1967 should receive 2 doses of MMR vaccine. People who received inactivated mumps vaccine or an unknown type of mumps vaccine before 1979 and are at high risk for mumps infection should consider immunization with 2 doses of   MMR vaccine. For females of childbearing age, rubella immunity should be determined. If there is no evidence of immunity, females who are not pregnant should be vaccinated. If there is no evidence of immunity, females who are pregnant should delay immunization until after pregnancy.  Unvaccinated health care workers born before [birth year] who lack laboratory evidence of measles, mumps, or rubella immunity or laboratory confirmation of disease should consider measles and mumps immunization with 2 doses of MMR vaccine or rubella immunization with 1 dose of MMR vaccine.  Pneumococcal 13-valent conjugate (PCV13) vaccine. When indicated, a person who is uncertain of her immunization history and has no record of immunization should receive the PCV13 vaccine. An adult aged 54 years or older who has certain medical conditions and has not been previously immunized should receive 1 dose of PCV13 vaccine. This PCV13 should be followed with a dose of pneumococcal polysaccharide (PPSV23) vaccine. The PPSV23 vaccine dose should be obtained at least 8 weeks after the dose of PCV13 vaccine. An adult aged 58 years or older who has certain medical conditions and previously received 1 or more doses of PPSV23 vaccine should receive 1 dose of PCV13. The PCV13 vaccine dose should be obtained 1 or more years after the last PPSV23 vaccine dose.  Pneumococcal polysaccharide (PPSV23) vaccine. When PCV13 is also indicated, PCV13 should be obtained first. All adults aged 58 years and older should be immunized. An adult younger than age 65 years who has certain medical conditions should be immunized. Any person who resides in a nursing home or long-term care facility should be immunized. An adult smoker should be immunized. People with an immunocompromised condition and certain other conditions should receive both PCV13 and PPSV23 vaccines. People with human immunodeficiency virus (HIV) infection should be immunized as soon as possible after diagnosis. Immunization during chemotherapy or radiation therapy should be avoided. Routine use of PPSV23 vaccine is not recommended for American Indians, [place] Natives, or people younger than 65 years unless there are medical conditions that require PPSV23 vaccine. When indicated,  people who have unknown immunization and have no record of immunization should receive PPSV23 vaccine. One-time revaccination 5 years after the first dose of PPSV23 is recommended for people aged 70 64 years who have chronic kidney failure, nephrotic syndrome, asplenia, or immunocompromised conditions. People who received 1 2 doses of PPSV23 before age 32 years should receive another dose of PPSV23 vaccine at age 96 years or later if at least 5 years have passed since the previous dose. Doses of PPSV23 are not needed for people immunized with PPSV23 at or after age 55 years.  Meningococcal vaccine. Adults with asplenia or persistent complement component deficiencies should receive 2 doses of quadrivalent meningococcal conjugate (MenACWY-D) vaccine. The doses should be obtained at least 2 months apart. Microbiologists working with certain meningococcal bacteria, [employer] recruits, people at risk during an outbreak, and people who travel to or live in countries with a high rate of meningitis should be immunized. A first-year college student up through age 58 years who is living in a residence hall should receive a dose if she did not receive a dose on or after her 16th birthday. Adults who have certain high-risk conditions should receive one or more doses of vaccine.  Hepatitis A vaccine. Adults who wish to be protected from this disease, have certain high-risk conditions, work with hepatitis A-infected animals, work in hepatitis A research labs, or travel to or work in countries with a high rate of hepatitis A should be  immunized. Adults who were previously unvaccinated and who anticipate close contact with an international adoptee during the first 60 days after arrival in the [place] from a country with a high rate of hepatitis A should be immunized.  Hepatitis B vaccine.  Adults who wish to be protected from this disease, have certain high-risk conditions, may be exposed to blood or other infectious  body fluids, are household contacts or sex partners of hepatitis B positive people, are clients or workers in certain care facilities, or travel to or work in countries with a high rate of hepatitis B should be immunized.  Haemophilus influenzae type b (Hib) vaccine. A previously unvaccinated person with asplenia or sickle cell disease or having a scheduled splenectomy should receive 1 dose of Hib vaccine. Regardless of previous immunization, a recipient of a hematopoietic stem cell transplant should receive a 3-dose series 6 12 months after her successful transplant. Hib vaccine is not recommended for adults with HIV infection.  Preventive Services / Frequency Ages 6 to 39years  Blood pressure check.** / Every 1 to 2 years.  Lipid and cholesterol check.** / Every 5 years beginning at age 39.  Clinical breast exam.** / Every 3 years for women in their 61s and 62s.  BRCA-related cancer risk assessment.** / For women who have family members with a BRCA-related cancer (breast, ovarian, tubal, or peritoneal cancers).  Pap test.** / Every 2 years from ages 47 through 85. Every 3 years starting at age 34 through age 12 or 74 with a history of 3 consecutive normal Pap tests.  HPV screening.** / Every 3 years from ages 46 through ages 43 to 54 with a history of 3 consecutive normal Pap tests.  Hepatitis C blood test.** / For any individual with known risks for hepatitis C.  Skin self-exam. / Monthly.  Influenza vaccine. / Every year.  Tetanus, diphtheria, and acellular pertussis (Tdap, Td) vaccine.** / Consult your health care provider. Pregnant women should receive 1 dose of Tdap vaccine during each pregnancy. 1 dose of Td every 10 years.  Varicella vaccine.** / Consult your health care provider. Pregnant females who do not have evidence of immunity should receive the first dose after pregnancy.  HPV vaccine. / 3 doses over 6 months, if 64 and younger. The vaccine is not recommended for use in  pregnant females. However, pregnancy testing is not needed before receiving a dose.  Measles, mumps, rubella (MMR) vaccine.** / You need at least 1 dose of MMR if you were born in 1957 or later. You may also need a 2nd dose. For females of childbearing age, rubella immunity should be determined. If there is no evidence of immunity, females who are not pregnant should be vaccinated. If there is no evidence of immunity, females who are pregnant should delay immunization until after pregnancy.  Pneumococcal 13-valent conjugate (PCV13) vaccine.** / Consult your health care provider.  Pneumococcal polysaccharide (PPSV23) vaccine.** / 1 to 2 doses if you smoke cigarettes or if you have certain conditions.  Meningococcal vaccine.** / 1 dose if you are age 71 to 37 years and a Market researcher living in a residence hall, or have one of several medical conditions, you need to get vaccinated against meningococcal disease. You may also need additional booster doses.  Hepatitis A vaccine.** / Consult your health care provider.  Hepatitis B vaccine.** / Consult your health care provider.  Haemophilus influenzae type b (Hib) vaccine.** / Consult your health care provider.  Ages 55 to 64years  Blood pressure check.** / Every 1 to 2 years.  Lipid and cholesterol check.** / Every 5 years beginning at age 20 years.  Lung cancer screening. / Every year if you are aged 55 80 years and have a 30-pack-year history of smoking and currently smoke or have quit within the past 15 years. Yearly screening is stopped once you have quit smoking for at least 15 years or develop a health problem that would prevent you from having lung cancer treatment.  Clinical breast exam.** / Every year after age 40 years.  BRCA-related cancer risk assessment.** / For women who have family members with a BRCA-related cancer (breast, ovarian, tubal, or peritoneal cancers).  Mammogram.** / Every year beginning at age 40  years and continuing for as long as you are in good health. Consult with your health care provider.  Pap test.** / Every 3 years starting at age 30 years through age 65 or 70 years with a history of 3 consecutive normal Pap tests.  HPV screening.** / Every 3 years from ages 30 years through ages 65 to 70 years with a history of 3 consecutive normal Pap tests.  Fecal occult blood test (FOBT) of stool. / Every year beginning at age 50 years and continuing until age 75 years. You may not need to do this test if you get a colonoscopy every 10 years.  Flexible sigmoidoscopy or colonoscopy.** / Every 5 years for a flexible sigmoidoscopy or every 10 years for a colonoscopy beginning at age 50 years and continuing until age 75 years.  Hepatitis C blood test.** / For all people born from 1945 through 1965 and any individual with known risks for hepatitis C.  Skin self-exam. / Monthly.  Influenza vaccine. / Every year.  Tetanus, diphtheria, and acellular pertussis (Tdap/Td) vaccine.** / Consult your health care provider. Pregnant women should receive 1 dose of Tdap vaccine during each pregnancy. 1 dose of Td every 10 years.  Varicella vaccine.** / Consult your health care provider. Pregnant females who do not have evidence of immunity should receive the first dose after pregnancy.  Zoster vaccine.** / 1 dose for adults aged 60 years or older.  Measles, mumps, rubella (MMR) vaccine.** / You need at least 1 dose of MMR if you were born in 1957 or later. You may also need a 2nd dose. For females of childbearing age, rubella immunity should be determined. If there is no evidence of immunity, females who are not pregnant should be vaccinated. If there is no evidence of immunity, females who are pregnant should delay immunization until after pregnancy.  Pneumococcal 13-valent conjugate (PCV13) vaccine.** / Consult your health care provider.  Pneumococcal polysaccharide (PPSV23) vaccine.** / 1 to 2 doses if  you smoke cigarettes or if you have certain conditions.  Meningococcal vaccine.** / Consult your health care provider.  Hepatitis A vaccine.** / Consult your health care provider.  Hepatitis B vaccine.** / Consult your health care provider.  Haemophilus influenzae type b (Hib) vaccine.** / Consult your health care provider.  Ages 65 years and over  Blood pressure check.** / Every 1 to 2 years.  Lipid and cholesterol check.** / Every 5 years beginning at age 20 years.  Lung cancer screening. / Every year if you are aged 55 80 years and have a 30-pack-year history of smoking and currently smoke or have quit within the past 15 years. Yearly screening is stopped once you have quit smoking for at least 15 years or develop a health problem that   would prevent you from having lung cancer treatment.  Clinical breast exam.** / Every year after age 103 years.  BRCA-related cancer risk assessment.** / For women who have family members with a BRCA-related cancer (breast, ovarian, tubal, or peritoneal cancers).  Mammogram.** / Every year beginning at age 36 years and continuing for as long as you are in good health. Consult with your health care provider.  Pap test.** / Every 3 years starting at age 5 years through age 85 or 10 years with 3 consecutive normal Pap tests. Testing can be stopped between 65 and 70 years with 3 consecutive normal Pap tests and no abnormal Pap or HPV tests in the past 10 years.  HPV screening.** / Every 3 years from ages 93 years through ages 70 or 45 years with a history of 3 consecutive normal Pap tests. Testing can be stopped between 65 and 70 years with 3 consecutive normal Pap tests and no abnormal Pap or HPV tests in the past 10 years.  Fecal occult blood test (FOBT) of stool. / Every year beginning at age 8 years and continuing until age 45 years. You may not need to do this test if you get a colonoscopy every 10 years.  Flexible sigmoidoscopy or colonoscopy.** /  Every 5 years for a flexible sigmoidoscopy or every 10 years for a colonoscopy beginning at age 69 years and continuing until age 68 years.  Hepatitis C blood test.** / For all people born from [birth year] through 1965 and any individual with known risks for hepatitis C.  Osteoporosis screening.** / A one-time screening for women ages 7 years and over and women at risk for fractures or osteoporosis.  Skin self-exam. / Monthly.  Influenza vaccine. / Every year.  Tetanus, diphtheria, and acellular pertussis (Tdap/Td) vaccine.** / 1 dose of Td every 10 years.  Varicella vaccine.** / Consult your health care provider.  Zoster vaccine.** / 1 dose for adults aged 5 years or older.  Pneumococcal 13-valent conjugate (PCV13) vaccine.** / Consult your health care provider.  Pneumococcal polysaccharide (PPSV23) vaccine.** / 1 dose for all adults aged 74 years and older.  Meningococcal vaccine.** / Consult your health care provider.  Hepatitis A vaccine.** / Consult your health care provider.  Hepatitis B vaccine.** / Consult your health care provider.  Haemophilus influenzae type b (Hib) vaccine.** / Consult your health care provider. ** Family history and personal history of risk and conditions may change your health care provider's recommendations. Document Released: 12/06/2001 Document Revised: 07/31/2013  [facility] Patient Information 2014 [place], [place].   EXERCISE AND DIET:  We recommended that you start or continue a regular exercise program for good health. Regular exercise means any activity that makes your heart beat faster and makes you sweat.  We recommend exercising at least 30 minutes per day at least 3 days a week, preferably 5.  We also recommend a diet low in fat and sugar / carbohydrates.  Inactivity, poor dietary choices and obesity can cause diabetes, heart attack, stroke, and kidney damage, among others.     ALCOHOL AND SMOKING:  Women should limit their alcohol intake to no  more than 7 drinks/beers/glasses of wine (combined, not each!) per week. Moderation of alcohol intake to this level decreases your risk of breast cancer and liver damage.  ( And of course, no recreational drugs are part of a healthy lifestyle.)  Also, you should not be smoking at all or even being exposed to second hand smoke. Most people know smoking can  cause cancer, and various heart and lung diseases, but did you know it also contributes to weakening of your bones?  Aging of your skin?  Yellowing of your teeth and nails?   CALCIUM AND VITAMIN D:  Adequate intake of calcium and Vitamin D are recommended.  The recommendations for exact amounts of these supplements seem to change often, but generally speaking 600 mg of calcium (either carbonate or citrate) and 800 units of Vitamin D per day seems prudent. Certain women may benefit from higher intake of Vitamin D.  If you are among these women, your doctor will have told you during your visit.     PAP SMEARS:  Pap smears, to check for cervical cancer or precancers,  have traditionally been done yearly, although recent scientific advances have shown that most women can have pap smears less often.  However, every woman still should have a physical exam from her gynecologist or primary care physician every year. It will include a breast check, inspection of the vulva and vagina to check for abnormal growths or skin changes, a visual exam of the cervix, and then an exam to evaluate the size and shape of the uterus and ovaries.  And after 62 years of age, a rectal exam is indicated to check for rectal cancers. We will also provide age appropriate advice regarding health maintenance, like when you should have certain vaccines, screening for sexually transmitted diseases, bone density testing, colonoscopy, mammograms, etc.    MAMMOGRAMS:  All women over 38 years old should have a yearly mammogram. Many facilities now offer a "3D" mammogram, which may cost  around $50 extra out of pocket. If possible,  we recommend you accept the option to have the 3D mammogram performed.  It both reduces the number of women who will be called back for extra views which then turn out to be normal, and it is better than the routine mammogram at detecting truly abnormal areas.     COLONOSCOPY:  Colonoscopy to screen for colon cancer is recommended for all women at age 40.  We know, you hate the idea of the prep.  We agree, BUT, having colon cancer and not knowing it is worse!!  Colon cancer so often starts as a polyp that can be seen and removed at colonscopy, which can quite literally save your life!  And if your first colonoscopy is normal and you have no family history of colon cancer, most women don't have to have it again for 10 years.  Once every ten years, you can do something that may end up saving your life, right?  We will be happy to help you get it scheduled when you are ready.  Be sure to check your insurance coverage so you understand how much it will cost.  It may be covered as a preventative service at no cost, but you should check your particular policy.   1) Please continue all medications as directed. 2) Please call [facility] and be placed on "cancellation list" to perhaps get an earlier appt. 3) Please start Sertraline 16m- 1/2 tablet daily for the first week then titrate up to one full tablet. 4) Please continue checking your INR at home and providing information to [facility]. Last INR 02/27/18 was 2.9, coumadin refilled at current therapy 5) TKenney [facility]manwill [facility]) We will call you when your lab results are available, we will adjust statin is necessary or refill current  dosage at this time. 7) Keep your Cardiology appt. 8) Follow-up here in 3 months. Thinking of you and your family during your father's illness.

## 2018-03-07 NOTE — Progress Notes (Signed)
Subjective:    Patient ID: [name], female    DOB: Nov 22, 1955, 62 y.o.   MRN: [id number]  HPI: 10/31/17 OV:  [name] is here to establish as a new pt.  She is a pleasant 62 year old female.  PMH:HTN, HL, GERD, Morbid Obesity, Chronic back pain with radiculopathy RLE (back surgeries 1989, 1992, 2009, 12/2016-Dr. Elsner), hx of PE (1984, 2014), Hx of MI (2014, 2017?), Divertculitis, Chronic UTI, chronic narcotic dependence, anxiety, and OSA. She cannot recall last cards visit and reports needing SL nitro last month for CP/anxiety.  She was d/c'd from [facility] >3 years ago and she would like a referral to new clinic.  Current pain level is 7/10, constant and she "hurts everywhere, just everywhere". She denies tobacco use and estimates to drink 1 bottle water/day.  She reports poor appetite since last back surgery and is able to safely ambulate with walker and wears back brace "all the time". Her sister "[name]" is at [facility]  11/28/17 OV: [name] is here for f/u: HTN, chronic back pain- Pain Clinic called/left her VM last Thursday- she has not returned their call. Neurosurgeon called her/left her VM last Thursday as well, she has not returned their call. She reports intermittent chest tightness the last few weeks and required 2 doses of Nitro SL, with the last dose about 7-8 days ago. EKG today- Septal Infarct, age indeterminate- she has hx of MI in 2014 No sig changes from tracing 12/18 She has not been seen by cards in 2-3 years She is having a very difficult time preparing food and eating a balanced diet.  She is also experiencing immobility and sig insomnia r/t back pain Sister at [facility] during [place]  03/07/18 OV: [name] is here for CPE. She has several issues today 1) Uncontrolled chronic pain.  She was seen in ED 02/01/18 for pain control.  She missed appt with Pain Clinic this Monday due to diarrhea.  Next available appt June. 2) Anxiety r/t to her poor health and her  father is in active decline, currently at [facility] 3) She reports that home health has not been by to perform; skilled nursing, OT/PT, ADL care ins weeks 4) She has cards appt end of month, she denies current CP/chest heaviness/palpitations.  She estimates to only need 1-2 doses of Nitro/month She is unable to undress due immobility and pain, completed examination over clothing and around back brace Her sister is at [facility] during [place], "Acacia Villas", who works as a Network engineer at [employer]  Healthcare Maintenance: PAP- not indicated due to hysterectomy Mammogram- UTD Colonoscopy- UTD She declonmed  Patient Care Team    Relationship Specialty Notifications Start End  Mina [name] D, NP PCP - General Family Medicine  10/31/17   Adrian Prows, MD Consulting Physician Cardiology  08/26/15     Patient Active Problem List   Diagnosis Date Noted  . Chest tightness 11/28/2017  . Anxiety 11/02/2017  . Healthcare maintenance 10/31/2017  . Chronic bilateral back pain 10/31/2017  . Pulmonary embolism ([place]) 10/31/2017  . COPD (chronic obstructive pulmonary disease) ([place]) 10/31/2017  . Sleep apnea 10/31/2017  . History of Coumadin therapy 10/31/2017  . Lumbar radiculopathy, chronic   . Essential hypertension   . Lumbar back pain with radiculopathy affecting right lower extremity 06/05/2017  . Palliative care by specialist   . Right leg pain 06/02/2017  . Severe low back pain 06/02/2017  . Acute right-sided low back pain with sciatica   .  Chronic pulmonary embolism ([place]) 04/17/2013  . UTI (urinary tract infection) 04/17/2013  . Diverticulitis of sigmoid colon 04/16/2013  . Obesity, Class III, BMI 40-49.9 (morbid obesity) ([place]) 04/16/2013  . Chronic narcotic dependence ([place]) 04/16/2013  . Asthma 11/16/2011  . GERD (gastroesophageal reflux disease) 11/16/2011  . Hyperlipidemia 11/16/2011     Past Medical History:  Diagnosis Date  . Acid reflux   . Asthma   . Chronic pain   . COPD (chronic obstructive  pulmonary disease) ([place])   . Diverticulitis   . Heart murmur   . Hiatal hernia   . High cholesterol   . History of UTI   . Hypertension   . Labral tear of shoulder    left  . Myocardial infarction ([place]) 2014  . Pneumonia 2014  . Pulmonary embolism ([place]) last 2014   x 3   . Ruptured disc, cervical    1 in neck, 1 in midback and 1 in low back, 6 bulging discs,  upper and lower back, 5 slipped discs all the way uop and down back and   . Sleep apnea    uses cpap, pt does not know settings     Past Surgical History:  Procedure Laterality Date  .  bone spurs removed from feet Bilateral   . ABDOMINAL HYSTERECTOMY    . BACK SURGERY     fusion x 2 lower back, laser surgery mid and back and neck also done  . BREAST BIOPSY Right   . CARPAL TUNNEL RELEASE Right   . CHOLECYSTECTOMY    . colon polyps    . COLONOSCOPY WITH PROPOFOL N/A 04/08/2016   Procedure: COLONOSCOPY WITH PROPOFOL;  Surgeon: Carol Ada, MD;  Location: [facility] ENDOSCOPY;  Service: Endoscopy;  Laterality: N/A;  . colonscopy     x 2, with polyps removed both times  . IR FL GUIDED LOC OF NEEDLE/CATH TIP FOR SPINAL INJECTION RT  06/04/2017  . JOINT REPLACEMENT    . REPLACEMENT TOTAL KNEE Right   . TUBAL LIGATION       Family History  Problem Relation Age of Onset  . Heart failure Mother   . COPD Mother   . Heart failure Father   . COPD Father   . Cancer Other   . Heart failure Other   . Breast cancer Maternal Aunt   . Breast cancer Paternal Aunt   . Breast cancer Cousin      Social History   Substance and Sexual Activity  Drug Use No     Social History   Substance and Sexual Activity  Alcohol Use No     Social History   Tobacco Use  Smoking Status Never Smoker  Smokeless Tobacco Never Used     Outpatient Encounter Medications as of 03/07/2018  Medication Sig Note  . albuterol (PROVENTIL HFA;VENTOLIN HFA) 108 (90 Base) MCG/ACT inhaler Inhale 2 puffs into the lungs every 6 (six) hours as needed.  For wheezing   . aspirin EC 81 MG tablet Take 81 mg by mouth daily at 12 noon.    . diclofenac sodium (VOLTAREN) 1 % GEL Apply 2 g topically 4 (four) times daily as needed (For pain in shoulder, knees, back and neck.).    Marland Kitchen fentaNYL (DURAGESIC - DOSED MCG/HR) 100 MCG/HR Place 1 patch onto the skin daily as needed.   . furosemide (LASIX) 40 MG tablet TAKE 1 TABLET (40 MG TOTAL) BY MOUTH 2 (TWO) TIMES DAILY AS NEEDED FOR FLUID. FOR FLUID RETENTION.   Marland Kitchen  Hypromellose (ARTIFICIAL TEARS OP) Place 1 drop into both eyes daily as needed (dry eyes).   . isometheptene-acetaminophen-dichloralphenazone (MIDRIN) 65-100-325 MG capsule Take 1 capsule by mouth. Maximum 5 capsules in 12 hours for migraine headaches, 8 capsules in 24 hours for tension headaches.   . lactulose (CHRONULAC) 10 GM/15ML solution Take 15 mLs (10 g total) by mouth 2 (two) times daily.   Marland Kitchen lidocaine (LIDODERM) 5 % Place 1-3 patches onto the skin daily. Remove & Discard patch within 12 hours or as directed by MD   . losartan (COZAAR) 50 MG tablet TAKE 1 TABLET BY MOUTH DAILY AS NEEDED FOR BLOOD PRESSURE   . methocarbamol (ROBAXIN) 500 MG tablet Take 1 tablet (500 mg total) by mouth every 6 (six) hours as needed for muscle spasms.   . metoprolol succinate (TOPROL-XL) 50 MG 24 hr tablet TAKE 1 TABLET (50 MG TOTAL) BY MOUTH DAILY. TAKE WITH OR IMMEDIATELY FOLLOWING A MEAL.   . nitroGLYCERIN (NITROSTAT) 0.4 MG SL tablet Place 1 tablet (0.4 mg total) under the tongue every 5 (five) minutes as needed for chest pain.   Marland Kitchen nystatin [facility]) powder Apply 1 g topically daily as needed (rash).   Marland Kitchen omeprazole (PRILOSEC) 40 MG capsule TAKE 1 CAPSULE BY MOUTH TWICE A DAY   . ondansetron (ZOFRAN ODT) 8 MG disintegrating tablet 8mg  ODT q4 hours prn nausea   . Oxycodone HCl 10 MG TABS Take 1 tablet (10 mg total) by mouth every 6 (six) hours as needed (for severe pain).   Marland Kitchen oxyCODONE-acetaminophen (PERCOCET) 10-325 MG tablet Take 1 tablet by mouth every 6 (six)  hours as needed for pain.   . pravastatin (PRAVACHOL) 40 MG tablet TAKE 1 TABLET BY MOUTH DAILY AT 12 NOON.   Marland Kitchen trimethoprim (TRIMPEX) 100 MG tablet Take 100 mg by mouth daily.   . Vitamin D, Ergocalciferol, (DRISDOL) 50000 units CAPS capsule Take 50,000 Units by mouth every 7 (seven) days. 06/02/2017: Fridays  . warfarin (COUMADIN) 5 MG tablet TAKE 1 TABLET ON MONDAY,WEDNESDAY,FRIDAY AND A 1/2 TABLET THE REST OF THE WEEK   . [DISCONTINUED] albuterol (PROVENTIL HFA;VENTOLIN HFA) 108 (90 Base) MCG/ACT inhaler Inhale 2 puffs into the lungs every 6 (six) hours as needed. For wheezing   . [DISCONTINUED] diazepam (VALIUM) 10 MG tablet Take 1 tablet (10 mg total) by mouth every 12 (twelve) hours as needed for anxiety.   . [DISCONTINUED] diazepam (VALIUM) 10 MG tablet Take 1 tablet by mouth 3 (three) times daily as needed.   . [DISCONTINUED] methocarbamol (ROBAXIN) 500 MG tablet Take 500 mg by mouth every 6 (six) hours.   . [DISCONTINUED] warfarin (COUMADIN) 5 MG tablet TAKE 1 TABLET ON MONDAY,WEDNESDAY,FRIDAY AND A 1/2 TABLET THE REST OF THE WEEK   . sertraline (ZOLOFT) 50 MG tablet 1/2 tablet daily for one week, then increase to full tablet daily    No facility-administered encounter medications on file as of 03/07/2018.     Allergies: Latex; Other; Shellfish allergy; and Tape  Body mass index is 47.65 kg/m.  Blood pressure 120/79, pulse 95, height 5\' 3"  (1.6 m), weight 269 lb (122 kg), SpO2 97 %.   Review of Systems  Constitutional: Positive for activity change, appetite change and fatigue. Negative for chills, diaphoresis, fever and unexpected weight change.  HENT: Negative for congestion.   Eyes: Negative for visual disturbance.  Respiratory: Positive for chest tightness. Negative for cough, shortness of breath, wheezing and stridor.   Cardiovascular: Positive for chest pain. Negative for palpitations and  leg swelling.  Gastrointestinal: Negative for abdominal distention, abdominal pain,  blood in stool, constipation, diarrhea, nausea and vomiting.  Genitourinary: Negative for difficulty urinating and flank pain.  Musculoskeletal: Positive for arthralgias, back pain, gait problem, joint swelling, myalgias, neck pain and neck stiffness.  Neurological: Negative for dizziness and headaches.  Hematological: Does not bruise/bleed easily.  Psychiatric/Behavioral: Positive for dysphoric mood and sleep disturbance. The patient is nervous/anxious.        Objective:   Physical Exam  Constitutional: She is oriented to person, place, and time. She appears well-developed and well-nourished. No distress.  HENT:  Head: Normocephalic and atraumatic.  Right Ear: External ear normal. Tympanic membrane is not erythematous and not bulging. No decreased hearing is noted.  Left Ear: External ear normal. Tympanic membrane is not erythematous and not bulging. No decreased hearing is noted.  Nose: No mucosal edema or rhinorrhea. Right sinus exhibits no maxillary sinus tenderness and no frontal sinus tenderness. Left sinus exhibits no maxillary sinus tenderness and no frontal sinus tenderness.  Mouth/Throat: Oropharynx is clear and moist and mucous membranes are normal. Abnormal dentition.  Eyes: Pupils are equal, round, and reactive to light. Conjunctivae are normal.  Neck: Normal range of motion. Neck supple.  Cardiovascular: Normal rate, regular rhythm, normal heart sounds and intact distal pulses.  No murmur heard. Pulmonary/Chest: Breath sounds normal. No respiratory distress. She has no wheezes. She has no rales. She exhibits no tenderness.  Abdominal: Soft. Bowel sounds are normal. She exhibits no distension and no mass. There is no tenderness. There is no rebound and no guarding.  Genitourinary:  Genitourinary Comments: Declined examination  Musculoskeletal: She exhibits edema and tenderness.       Left shoulder: She exhibits tenderness.       Right hip: She exhibits tenderness.       Left  hip: She exhibits tenderness.       Right knee: Tenderness found.       Left knee: Tenderness found.       Right ankle: Tenderness.       Left ankle: Tenderness.       Cervical back: She exhibits tenderness.       Thoracic back: She exhibits tenderness.       Lumbar back: She exhibits tenderness.  Lymphadenopathy:    She has no cervical adenopathy.  Neurological: She is alert and oriented to person, place, and time.  Skin: Skin is warm and dry. No rash noted. She is not diaphoretic. No erythema. There is pallor.  Psychiatric: Her speech is normal and behavior is normal. Judgment and thought content normal. Her affect is blunt. Cognition and memory are normal.      Assessment & Plan:   1. Severe low back pain   2. Lumbar back pain with radiculopathy affecting right lower extremity   3. Lumbar radiculopathy, chronic   4. Healthcare maintenance   5. History of Coumadin therapy     Healthcare maintenance  1) Please continue all medications as directed. 2) Please call Pain Clinic and be placed on "cancellation list" to perhaps get an earlier appt. 3) Please start Sertraline 50mg - 1/2 tablet daily for the first week then titrate up to one full tablet. 4) Please continue checking your INR at home and providing information to the [facility] and our clinic. Last INR 02/27/18 was 2.9, coumadin refilled at current therapy 5) Kenney Houseman will review what she learned from [facility] and [facility] 6) We will call you when your lab results are  available, we will adjust statin is necessary or refill current dosage at this time. 7) Keep your Cardiology appt. 8) Follow-up here in 3 months. Thinking of you and your family during your father's illness.   FOLLOW-UP:  Return in about 3 months (around 06/07/2018) for Regular Follow Up.

## 2018-03-08 ENCOUNTER — Other Ambulatory Visit: Payer: Self-pay | Admitting: Adult Health

## 2018-03-08 ENCOUNTER — Telehealth: Payer: Self-pay | Admitting: Adult Health

## 2018-03-08 LAB — LIPID PANEL
Chol/HDL Ratio: 5.3 ratio — ABNORMAL HIGH (ref 0.0–4.4)
Cholesterol, Total: 252 mg/dL — ABNORMAL HIGH (ref 100–199)
HDL: 48 mg/dL (ref >39)
LDL CALC: 150 mg/dL — AB (ref 0–99)
TRIGLYCERIDES: 270 mg/dL — AB (ref 0–149)
VLDL CHOLESTEROL CAL: 54 mg/dL — AB (ref 5–40)

## 2018-03-08 LAB — COMPREHENSIVE METABOLIC PANEL
A/G RATIO: 1.4 (ref 1.2–2.2)
ALBUMIN: 4.2 g/dL (ref 3.6–4.8)
ALT: 18 IU/L (ref 0–32)
AST: 19 IU/L (ref 0–40)
Alkaline Phosphatase: 95 IU/L (ref 39–117)
BUN / CREAT RATIO: 9 — AB (ref 12–28)
BUN: 8 mg/dL (ref 8–27)
Bilirubin Total: 0.4 mg/dL (ref 0.0–1.2)
CALCIUM: 9.6 mg/dL (ref 8.7–10.3)
CO2: 21 mmol/L (ref 20–29)
Chloride: 105 mmol/L (ref 96–106)
Creatinine, Ser: 0.89 mg/dL (ref 0.57–1.00)
GFR calc non Af Amer: 70 mL/min/{1.73_m2} (ref >59)
GFR, EST AFRICAN AMERICAN: 80 mL/min/{1.73_m2} (ref >59)
Globulin, Total: 3 g/dL (ref 1.5–4.5)
Glucose: 121 mg/dL — ABNORMAL HIGH (ref 65–99)
POTASSIUM: 4.5 mmol/L (ref 3.5–5.2)
Sodium: 145 mmol/L — ABNORMAL HIGH (ref 134–144)
TOTAL PROTEIN: 7.2 g/dL (ref 6.0–8.5)

## 2018-03-08 LAB — CBC WITH DIFFERENTIAL/PLATELET
Basophils Absolute: 0.1 10*3/uL (ref 0.0–0.2)
Basos: 0 %
EOS (ABSOLUTE): 0.3 10*3/uL (ref 0.0–0.4)
Eos: 2 %
HEMOGLOBIN: 12.1 g/dL (ref 11.1–15.9)
Hematocrit: 40 % (ref 34.0–46.6)
IMMATURE GRANS (ABS): 0 10*3/uL (ref 0.0–0.1)
Immature Granulocytes: 0 %
LYMPHS ABS: 3.6 10*3/uL — AB (ref 0.7–3.1)
LYMPHS: 30 %
MCH: 21.2 pg — ABNORMAL LOW (ref 26.6–33.0)
MCHC: 30.3 g/dL — ABNORMAL LOW (ref 31.5–35.7)
MCV: 70 fL — AB (ref 79–97)
Monocytes Absolute: 0.9 10*3/uL (ref 0.1–0.9)
Monocytes: 8 %
NEUTROS ABS: 7 10*3/uL (ref 1.4–7.0)
Neutrophils: 60 %
PLATELETS: 460 10*3/uL — AB (ref 150–379)
RBC: 5.72 x10E6/uL — ABNORMAL HIGH (ref 3.77–5.28)
RDW: 19.6 % — ABNORMAL HIGH (ref 12.3–15.4)
WBC: 11.9 10*3/uL — ABNORMAL HIGH (ref 3.4–10.8)

## 2018-03-08 LAB — HEMOGLOBIN A1C
Est. average glucose Bld gHb Est-mCnc: 134 mg/dL
Hgb A1c MFr Bld: 6.3 % — ABNORMAL HIGH (ref 4.8–5.6)

## 2018-03-08 LAB — TSH: TSH: 2.76 u[IU]/mL (ref 0.450–4.500)

## 2018-03-08 MED ORDER — PRAVASTATIN SODIUM 40 MG PO TABS: ORAL_TABLET | ORAL | 2 refills | Status: DC

## 2018-03-08 NOTE — Telephone Encounter (Signed)
[name] with [facility] needs some additional clinical information on patient to begin home health. She can be called at [phone]

## 2018-03-08 NOTE — Telephone Encounter (Signed)
LVM for Melissa with [facility] her to disregard home health orders as pt is already set up with [facility].  Charyl Bigger, CMA

## 2018-03-15 DIAGNOSIS — M5416 Radiculopathy, lumbar region: Secondary | ICD-10-CM | POA: Diagnosis not present

## 2018-03-15 DIAGNOSIS — J449 Chronic obstructive pulmonary disease, unspecified: Secondary | ICD-10-CM | POA: Diagnosis not present

## 2018-03-15 DIAGNOSIS — M179 Osteoarthritis of knee, unspecified: Secondary | ICD-10-CM | POA: Diagnosis not present

## 2018-03-27 ENCOUNTER — Other Ambulatory Visit: Payer: Self-pay | Admitting: Adult Health

## 2018-03-29 ENCOUNTER — Other Ambulatory Visit: Payer: Self-pay | Admitting: Adult Health

## 2018-04-06 ENCOUNTER — Telehealth: Payer: Self-pay | Admitting: Adult Health

## 2018-04-06 NOTE — Telephone Encounter (Signed)
Received call from [facility] [phone] requesting Verbal Auth for continued Home Health treatment.  ---Forwarding message to medical assistant.  --Dion Body

## 2018-04-10 ENCOUNTER — Encounter: Payer: Medicare HMO | Attending: Physical Medicine & Rehabilitation

## 2018-04-10 ENCOUNTER — Encounter: Payer: Self-pay | Admitting: Physical Medicine & Rehabilitation

## 2018-04-10 ENCOUNTER — Ambulatory Visit: Payer: Medicare HMO | Admitting: Physical Medicine & Rehabilitation

## 2018-04-10 ENCOUNTER — Other Ambulatory Visit: Payer: Self-pay

## 2018-04-10 VITALS — BP 154/96 | HR 102 | Ht 63.0 in | Wt 263.2 lb

## 2018-04-10 DIAGNOSIS — M5441 Lumbago with sciatica, right side: Secondary | ICD-10-CM | POA: Diagnosis not present

## 2018-04-10 DIAGNOSIS — I252 Old myocardial infarction: Secondary | ICD-10-CM | POA: Diagnosis not present

## 2018-04-10 DIAGNOSIS — M542 Cervicalgia: Secondary | ICD-10-CM | POA: Diagnosis not present

## 2018-04-10 DIAGNOSIS — Z87440 Personal history of urinary (tract) infections: Secondary | ICD-10-CM | POA: Diagnosis not present

## 2018-04-10 DIAGNOSIS — I10 Essential (primary) hypertension: Secondary | ICD-10-CM | POA: Diagnosis not present

## 2018-04-10 DIAGNOSIS — E7800 Pure hypercholesterolemia, unspecified: Secondary | ICD-10-CM | POA: Diagnosis not present

## 2018-04-10 DIAGNOSIS — M24512 Contracture, left shoulder: Secondary | ICD-10-CM | POA: Insufficient documentation

## 2018-04-10 DIAGNOSIS — G4730 Sleep apnea, unspecified: Secondary | ICD-10-CM | POA: Diagnosis not present

## 2018-04-10 DIAGNOSIS — M48061 Spinal stenosis, lumbar region without neurogenic claudication: Secondary | ICD-10-CM | POA: Insufficient documentation

## 2018-04-10 DIAGNOSIS — M1712 Unilateral primary osteoarthritis, left knee: Secondary | ICD-10-CM | POA: Diagnosis not present

## 2018-04-10 DIAGNOSIS — G8921 Chronic pain due to trauma: Secondary | ICD-10-CM | POA: Diagnosis not present

## 2018-04-10 DIAGNOSIS — G8929 Other chronic pain: Secondary | ICD-10-CM | POA: Insufficient documentation

## 2018-04-10 DIAGNOSIS — Z981 Arthrodesis status: Secondary | ICD-10-CM | POA: Insufficient documentation

## 2018-04-10 DIAGNOSIS — M47814 Spondylosis without myelopathy or radiculopathy, thoracic region: Secondary | ICD-10-CM | POA: Diagnosis not present

## 2018-04-10 DIAGNOSIS — K219 Gastro-esophageal reflux disease without esophagitis: Secondary | ICD-10-CM | POA: Insufficient documentation

## 2018-04-10 DIAGNOSIS — J45909 Unspecified asthma, uncomplicated: Secondary | ICD-10-CM | POA: Insufficient documentation

## 2018-04-10 DIAGNOSIS — M961 Postlaminectomy syndrome, not elsewhere classified: Secondary | ICD-10-CM | POA: Diagnosis not present

## 2018-04-10 MED ORDER — PREGABALIN 25 MG PO CAPS: 25.0000 mg | ORAL_CAPSULE | Freq: Two times a day (BID) | ORAL | 1 refills | Status: DC

## 2018-04-10 MED ORDER — METHOCARBAMOL 500 MG PO TABS: 500.0000 mg | ORAL_TABLET | Freq: Four times a day (QID) | ORAL | 1 refills | Status: DC | PRN

## 2018-04-10 NOTE — Patient Instructions (Signed)
The approach will use involves a combination of medications, injections as well as therapy.  Aquatic therapy may be the best given your problem with bearing weight through your knees  I would recommend a visit with your neurosurgeon Dr. Ellene Route to discuss the options that you may have for your low back or mid back area.  I would recommend over-the-counter lidocaine patches back pain.  This may be more of a muscular issue and that pain with light pressure
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
## 2018-04-11 ENCOUNTER — Encounter: Payer: Self-pay | Admitting: Adult Health

## 2018-04-11 ENCOUNTER — Ambulatory Visit (INDEPENDENT_AMBULATORY_CARE_PROVIDER_SITE_OTHER): Payer: Medicare HMO | Admitting: Adult Health

## 2018-04-11 VITALS — BP 116/75 | HR 82

## 2018-04-11 DIAGNOSIS — J209 Acute bronchitis, unspecified: Secondary | ICD-10-CM

## 2018-04-11 DIAGNOSIS — J440 Chronic obstructive pulmonary disease with acute lower respiratory infection: Secondary | ICD-10-CM

## 2018-04-11 DIAGNOSIS — J45909 Unspecified asthma, uncomplicated: Secondary | ICD-10-CM | POA: Diagnosis not present

## 2018-04-11 DIAGNOSIS — I10 Essential (primary) hypertension: Secondary | ICD-10-CM

## 2018-04-11 DIAGNOSIS — R69 Illness, unspecified: Secondary | ICD-10-CM | POA: Diagnosis not present

## 2018-04-11 MED ORDER — ALBUTEROL SULFATE (2.5 MG/3ML) 0.083% IN NEBU: 2.5000 mg | INHALATION_SOLUTION | Freq: Four times a day (QID) | RESPIRATORY_TRACT | 1 refills | Status: DC | PRN

## 2018-04-11 MED ORDER — ALBUTEROL SULFATE HFA 108 (90 BASE) MCG/ACT IN AERS: 2.0000 | INHALATION_SPRAY | Freq: Four times a day (QID) | RESPIRATORY_TRACT | 2 refills | Status: AC | PRN

## 2018-04-11 MED ORDER — HYDROCOD POLST-CPM POLST ER 10-8 MG/5ML PO SUER: 5.0000 mL | Freq: Two times a day (BID) | ORAL | 0 refills | Status: DC | PRN

## 2018-04-11 MED ORDER — AMOXICILLIN-POT CLAVULANATE 875-125 MG PO TABS: 1.0000 | ORAL_TABLET | Freq: Two times a day (BID) | ORAL | 0 refills | Status: DC

## 2018-04-11 NOTE — Assessment & Plan Note (Signed)
  Please take Augmentin as directed. Please take Nebulizer and inhaler as needed. Tussionex cough medication as needed- DO NOT TAKE ANY narcotics, muscle relaxers, sedative, or any medication that may cause drowsiness or decrease respiratory rate when taking Tussionex cough medication. If symptoms persist after antibiotic completed, then please call clinic.

## 2018-04-11 NOTE — Patient Instructions (Addendum)
Acute Bronchitis, Adult Acute bronchitis is sudden (acute) swelling of the air tubes (bronchi) in the lungs. Acute bronchitis causes these tubes to fill with mucus, which can make it hard to breathe. It can also cause coughing or wheezing. In adults, acute bronchitis usually goes away within 2 weeks. A cough caused by bronchitis may last up to 3 weeks. Smoking, allergies, and asthma can make the condition worse. Repeated episodes of bronchitis may cause further lung problems, such as chronic obstructive pulmonary disease (COPD). What are the causes? This condition can be caused by germs and by substances that irritate the lungs, including:  Cold and flu viruses. This condition is most often caused by the same virus that causes a cold.  Bacteria.  Exposure to tobacco smoke, dust, fumes, and air pollution.  What increases the risk? This condition is more likely to develop in people who:  Have close contact with someone with acute bronchitis.  Are exposed to lung irritants, such as tobacco smoke, dust, fumes, and vapors.  Have a weak immune system.  Have a respiratory condition such as asthma.  What are the signs or symptoms? Symptoms of this condition include:  A cough.  Coughing up clear, yellow, or green mucus.  Wheezing.  Chest congestion.  Shortness of breath.  A fever.  Body aches.  Chills.  A sore throat.  How is this diagnosed? This condition is usually diagnosed with a physical exam. During the exam, your health care provider may order tests, such as chest X-rays, to rule out other conditions. He or she may also:  Test a sample of your mucus for bacterial infection.  Check the level of oxygen in your blood. This is done to check for pneumonia.  Do a chest X-ray or lung function testing to rule out pneumonia and other conditions.  Perform blood tests.  Your health care provider will also ask about your symptoms and medical history. How is this  treated? Most cases of acute bronchitis clear up over time without treatment. Your health care provider may recommend:  Drinking more fluids. Drinking more makes your mucus thinner, which may make it easier to breathe.  Taking a medicine for a fever or cough.  Taking an antibiotic medicine.  Using an inhaler to help improve shortness of breath and to control a cough.  Using a cool mist vaporizer or humidifier to make it easier to breathe.  Follow these instructions at home: Medicines  Take over-the-counter and prescription medicines only as told by your health care provider.  If you were prescribed an antibiotic, take it as told by your health care provider. Do not stop taking the antibiotic even if you start to feel better. General instructions  Get plenty of rest.  Drink enough fluids to keep your urine clear or pale yellow.  Avoid smoking and secondhand smoke. Exposure to cigarette smoke or irritating chemicals will make bronchitis worse. If you smoke and you need help quitting, ask your health care provider. Quitting smoking will help your lungs heal faster.  Use an inhaler, cool mist vaporizer, or humidifier as told by your health care provider.  Keep all follow-up visits as told by your health care provider. This is important. How is this prevented? To lower your risk of getting this condition again:  Wash your hands often with soap and water. If soap and water are not available, use hand sanitizer.  Avoid contact with people who have cold symptoms.  Try not to touch your hands to your   mouth, nose, or eyes.  Make sure to get the flu shot every year.  Contact a health care provider if:  Your symptoms do not improve in 2 weeks of treatment. Get help right away if:  You cough up blood.  You have chest pain.  You have severe shortness of breath.  You become dehydrated.  You faint or keep feeling like you are going to faint.  You keep vomiting.  You have a  severe headache.  Your fever or chills gets worse. This information is not intended to replace advice given to you by your health care provider. Make sure you discuss any questions you have with your health care provider. Document Released: 11/17/2004 Document Revised: 05/04/2016 Document Reviewed: 03/30/2016 Elsevier Interactive Patient Education  2018 [employer].   Please take Augmentin as directed. Please take Nebulizer and inhaler as needed. Tussionex cough medication as needed- DO NOT TAKE ANY narcotics, muscle relaxers, sedative, or any medication that may cause drowsiness or decrease respiratory rate when taking Tussionex cough medication. If symptoms persist after antibiotic completed, then please call clinic. FEEL BETTER!

## 2018-04-11 NOTE — Assessment & Plan Note (Signed)
BP at goal 116/75, HR 82

## 2018-04-11 NOTE — Progress Notes (Signed)
Subjective:    Patient ID: [name], female    DOB: November 09, 1955, 62 y.o.   MRN: [id number]   HPI:  [name] presents with constant non-productive cough, frontal HA (6/10), fatigue, body aches, chills, night sweats (however this is her baseline), fatigue,intermittent wheezing and sore throat (2/10)- that all began >1.5 weeks ago and has been steadily worsening. She has been using nebulizer treatments and OTC Mucinex without much sx relief. She feels that her Albuterol inhaler has not relieved intermittent wheezing much She denies CP/palpitations/fever/N/V/D She denies nasal drainage or popping ears  Patient Care Team    Relationship Specialty Notifications Start End  Mina [name] D, NP PCP - General Family Medicine  10/31/17   Adrian Prows, MD Consulting Physician Cardiology  08/26/15     Patient Active Problem List   Diagnosis Date Noted  . Acute bronchitis 04/11/2018  . Chronic pain due to trauma 04/10/2018  . Osteoarthritis of left knee 04/10/2018  . 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 Lumbar post-laminectomy syndrome 04/10/2018  . Chest tightness 11/28/2017  . Anxiety 11/02/2017  . Healthcare maintenance 10/31/2017  . Chronic bilateral back pain 10/31/2017  . Pulmonary embolism ([place]) 10/31/2017  . COPD (chronic obstructive pulmonary disease) ([place]) 10/31/2017  . Sleep apnea 10/31/2017  . History of Coumadin therapy 10/31/2017  . Lumbar radiculopathy, chronic   . Essential hypertension   . Lumbar back pain with radiculopathy affecting right lower extremity 06/05/2017  . Palliative care by specialist   . Right leg pain 06/02/2017  . Severe low back pain 06/02/2017  . Acute right-sided low back pain with sciatica   . Chronic pulmonary embolism ([place]) 04/17/2013  . UTI (urinary tract infection) 04/17/2013  . Diverticulitis of sigmoid colon 04/16/2013  . Obesity, Class III, BMI 40-49.9 (morbid obesity) ([place]) 04/16/2013  . Chronic narcotic  dependence ([place]) 04/16/2013  . Asthma 11/16/2011  . GERD (gastroesophageal reflux disease) 11/16/2011  . Hyperlipidemia 11/16/2011     Past Medical History:  Diagnosis Date  . Acid reflux   . Asthma   . Chronic pain   . COPD (chronic obstructive pulmonary disease) ([place])   . Diverticulitis   . Heart murmur   . Hiatal hernia   . High cholesterol   . History of UTI   . Hypertension   . Labral tear of shoulder    left  . Myocardial infarction ([place]) 2014  . Pneumonia 2014  . Pulmonary embolism ([place]) last 2014   x 3   . Ruptured disc, cervical    1 in neck, 1 in midback and 1 in low back, 6 bulging discs,  upper and lower back, 5 slipped discs all the way uop and down back and   . Sleep apnea    uses cpap, pt does not know settings     Past Surgical History:  Procedure Laterality Date  .  bone spurs removed from feet Bilateral   . ABDOMINAL HYSTERECTOMY    . BACK SURGERY     fusion x 2 lower back, laser surgery mid and back and neck also done  . BREAST BIOPSY Right   . CARPAL TUNNEL RELEASE Right   . CHOLECYSTECTOMY    . colon polyps    . COLONOSCOPY WITH PROPOFOL N/A 04/08/2016   Procedure: COLONOSCOPY WITH PROPOFOL;  Surgeon: Carol Ada, MD;  Location: [facility];  Service: Endoscopy;  Laterality: N/A;  . colonscopy     x 2, with polyps removed both times  .  IR FL GUIDED LOC OF NEEDLE/CATH TIP FOR SPINAL INJECTION RT  06/04/2017  . JOINT REPLACEMENT    . REPLACEMENT TOTAL KNEE Right   . TUBAL LIGATION       Family History  Problem Relation Age of Onset  . Heart failure Mother   . COPD Mother   . Heart failure Father   . COPD Father   . Cancer Other   . Heart failure Other   . Breast cancer Maternal Aunt   . Breast cancer Paternal Aunt   . Breast cancer Cousin      Social History   Substance and Sexual Activity  Drug Use No     Social History   Substance and Sexual Activity  Alcohol Use No     Social History   Tobacco Use  Smoking  Status Never Smoker  Smokeless Tobacco Never Used     Outpatient Encounter Medications as of 04/11/2018  Medication Sig Note  . albuterol (PROVENTIL HFA;VENTOLIN HFA) 108 (90 Base) MCG/ACT inhaler Inhale 2 puffs into the lungs every 6 (six) hours as needed. For wheezing   . aspirin EC 81 MG tablet Take 81 mg by mouth daily at 12 noon.    . diazepam (VALIUM) 10 MG tablet Take 10 mg by mouth at bedtime as needed for anxiety. 04/10/2018: Fill date 02/08/18 #30  today #5  last dose probably 4 weeks ago  . diclofenac sodium (VOLTAREN) 1 % GEL Apply 2 g topically 4 (four) times daily as needed (For pain in shoulder, knees, back and neck.).    Marland Kitchen furosemide (LASIX) 40 MG tablet TAKE 1 TABLET (40 MG TOTAL) BY MOUTH 2 (TWO) TIMES DAILY AS NEEDED FOR FLUID.   Marland Kitchen Hypromellose (ARTIFICIAL TEARS OP) Place 1 drop into both eyes daily as needed (dry eyes).   Marland Kitchen lactulose (CHRONULAC) 10 GM/15ML solution Take 15 mLs (10 g total) by mouth 2 (two) times daily. 04/10/2018: Only if needed  . lidocaine (LIDODERM) 5 % Place 1-3 patches onto the skin daily. Remove & Discard patch within 12 hours or as directed by MD   . losartan (COZAAR) 50 MG tablet TAKE 1 TABLET BY MOUTH DAILY AS NEEDED FOR BLOOD PRESSURE   . methocarbamol (ROBAXIN) 500 MG tablet Take 1 tablet (500 mg total) by mouth every 6 (six) hours as needed for muscle spasms.   . metoprolol succinate (TOPROL-XL) 50 MG 24 hr tablet TAKE 1 TABLET (50 MG TOTAL) BY MOUTH DAILY. TAKE WITH OR IMMEDIATELY FOLLOWING A MEAL.   . nitroGLYCERIN (NITROSTAT) 0.4 MG SL tablet Place 1 tablet (0.4 mg total) under the tongue every 5 (five) minutes as needed for chest pain.   Marland Kitchen nystatin [facility]) powder Apply 1 g topically daily as needed (rash).   Marland Kitchen omeprazole (PRILOSEC) 40 MG capsule TAKE 1 CAPSULE BY MOUTH TWICE A DAY   . Oxycodone HCl 10 MG TABS Take 1 tablet (10 mg total) by mouth every 6 (six) hours as needed (for severe pain). 04/10/2018: Fill date 02/09/18 #20  Today #8  last dose  over 4 weeks ago  . oxyCODONE-acetaminophen (PERCOCET) 10-325 MG tablet Take 1 tablet by mouth every 6 (six) hours as needed for pain. 04/10/2018: Fill date 02/05/18 # 15  today #4  last dose about 4 days ago  . pravastatin (PRAVACHOL) 40 MG tablet TAKE 1 TABLET BY MOUTH DAILY AT 12 NOON.   Marland Kitchen pregabalin (LYRICA) 25 MG capsule Take 1 capsule (25 mg total) by mouth 2 (two) times daily.   Marland Kitchen  trimethoprim (TRIMPEX) 100 MG tablet Take 100 mg by mouth daily.   . Vitamin D, Ergocalciferol, (DRISDOL) 50000 units CAPS capsule Take 50,000 Units by mouth every 7 (seven) days. 06/02/2017: Fridays  . warfarin (COUMADIN) 5 MG tablet TAKE 1 TABLET ON MONDAY,WEDNESDAY,FRIDAY AND A 1/2 TABLET THE REST OF THE WEEK   . [DISCONTINUED] albuterol (PROVENTIL HFA;VENTOLIN HFA) 108 (90 Base) MCG/ACT inhaler Inhale 2 puffs into the lungs every 6 (six) hours as needed. For wheezing   . albuterol (PROVENTIL) (2.5 MG/3ML) 0.083% nebulizer solution Take 3 mLs (2.5 mg total) by nebulization every 6 (six) hours as needed for wheezing or shortness of breath.   Marland Kitchen amoxicillin-clavulanate (AUGMENTIN) 875-125 MG tablet Take 1 tablet by mouth 2 (two) times daily.   . chlorpheniramine-HYDROcodone (TUSSIONEX) 10-8 MG/5ML SUER Take 5 mLs by mouth every 12 (twelve) hours as needed.   . ondansetron (ZOFRAN ODT) 8 MG disintegrating tablet 8mg  ODT q4 hours prn nausea (Patient not taking: Reported on 04/11/2018)   . sertraline (ZOLOFT) 50 MG tablet Take one tablet by mouth daily (Patient not taking: Reported on 04/11/2018)    No facility-administered encounter medications on file as of 04/11/2018.     Allergies: Latex; Other; Shellfish allergy; and Tape  There is no height or weight on file to calculate BMI.  Blood pressure 116/75, pulse 82, SpO2 94 %.  Review of Systems  Constitutional: Positive for activity change, chills and fatigue. Negative for appetite change, diaphoresis, fever and unexpected weight change.  HENT: Positive for sore  throat. Negative for congestion, ear pain, postnasal drip and voice change.   Eyes: Negative for visual disturbance.  Respiratory: Positive for cough, chest tightness, shortness of breath and wheezing.   Cardiovascular: Negative for chest pain, palpitations and leg swelling.  Gastrointestinal: Negative for abdominal distention, abdominal pain, blood in stool, constipation, diarrhea, nausea and vomiting.  Musculoskeletal: Positive for arthralgias, back pain, gait problem, joint swelling, myalgias, neck pain and neck stiffness.  Neurological: Positive for headaches. Negative for dizziness.  Hematological: Does not bruise/bleed easily.  Psychiatric/Behavioral: Positive for dysphoric mood and sleep disturbance. The patient is nervous/anxious.        Objective:   Physical Exam  Constitutional: She is oriented to person, place, and time. She appears well-developed and well-nourished. No distress.  HENT:  Head: Normocephalic and atraumatic.  Right Ear: External ear normal. Tympanic membrane is not erythematous and not bulging. No decreased hearing is noted.  Left Ear: External ear normal. Tympanic membrane is not erythematous and not bulging. No decreased hearing is noted.  Nose: No mucosal edema or rhinorrhea. Right sinus exhibits no maxillary sinus tenderness and no frontal sinus tenderness. Left sinus exhibits no maxillary sinus tenderness and no frontal sinus tenderness.  Mouth/Throat: Uvula is midline and mucous membranes are normal. Abnormal dentition. Posterior oropharyngeal edema and posterior oropharyngeal erythema present. No oropharyngeal exudate or tonsillar abscesses.  Eyes: Pupils are equal, round, and reactive to light. Conjunctivae and EOM are normal.  Neck: Normal range of motion. Neck supple.  Cardiovascular: Normal rate, regular rhythm, normal heart sounds and intact distal pulses.  Pulmonary/Chest: Effort normal and breath sounds normal. No stridor. No respiratory distress. She  has no decreased breath sounds. She has no wheezes. She has no rhonchi. She has no rales. She exhibits no tenderness.  Lymphadenopathy:    She has no cervical adenopathy.  Neurological: She is oriented to person, place, and time.  Skin: Skin is warm and dry. Capillary refill takes less than 2 seconds.  No rash noted. She is not diaphoretic. No erythema. No pallor.  Psychiatric: She has a normal mood and affect. Her behavior is normal. Judgment and thought content normal.  Nursing note and vitals reviewed.     Assessment & Plan:   1. Uncomplicated asthma, unspecified asthma severity, unspecified whether persistent   2. Acute bronchitis, unspecified organism   3. Essential hypertension     Acute bronchitis  Please take Augmentin as directed. Please take Nebulizer and inhaler as needed. Tussionex cough medication as needed- DO NOT TAKE ANY narcotics, muscle relaxers, sedative, or any medication that may cause drowsiness or decrease respiratory rate when taking Tussionex cough medication. If symptoms persist after antibiotic completed, then please call clinic.  Asthma Albuterol inhaler and nebulizer sent in- her last dose of nebulizer was Sunday O2 sat on RA - 94%, no acute respiratory distress noted  Essential hypertension BP at goal 116/75, HR 82   FOLLOW-UP:  Return if symptoms worsen or fail to improve.

## 2018-04-11 NOTE — Assessment & Plan Note (Addendum)
Albuterol inhaler and nebulizer sent in- her last dose of nebulizer was Sunday O2 sat on RA - 94%, no acute respiratory distress noted

## 2018-04-15 DIAGNOSIS — J449 Chronic obstructive pulmonary disease, unspecified: Secondary | ICD-10-CM | POA: Diagnosis not present

## 2018-04-15 DIAGNOSIS — M5416 Radiculopathy, lumbar region: Secondary | ICD-10-CM | POA: Diagnosis not present

## 2018-04-15 DIAGNOSIS — M179 Osteoarthritis of knee, unspecified: Secondary | ICD-10-CM | POA: Diagnosis not present

## 2018-04-16 LAB — TOXASSURE SELECT,+ANTIDEPR,UR

## 2018-04-16 NOTE — Telephone Encounter (Signed)
LVM for Traci to return call.  Charyl Bigger, CMA

## 2018-04-16 NOTE — Telephone Encounter (Signed)
Verbal orders given to [facility].  Charyl Bigger, CMA

## 2018-04-20 ENCOUNTER — Telehealth: Payer: Self-pay | Admitting: *Deleted

## 2018-04-20 NOTE — Telephone Encounter (Signed)
Urine drug screen for this encounter is consistent for prescribed medication. She has been prescribed diazepam and sertraline.

## 2018-04-28 ENCOUNTER — Other Ambulatory Visit: Payer: Self-pay | Admitting: Adult Health

## 2018-05-01 ENCOUNTER — Ambulatory Visit: Payer: Medicare HMO | Admitting: Physical Medicine & Rehabilitation

## 2018-05-01 ENCOUNTER — Encounter: Payer: Medicare HMO | Attending: Physical Medicine & Rehabilitation

## 2018-05-01 DIAGNOSIS — M1712 Unilateral primary osteoarthritis, left knee: Secondary | ICD-10-CM | POA: Insufficient documentation

## 2018-05-01 DIAGNOSIS — G4730 Sleep apnea, unspecified: Secondary | ICD-10-CM | POA: Insufficient documentation

## 2018-05-01 DIAGNOSIS — M47814 Spondylosis without myelopathy or radiculopathy, thoracic region: Secondary | ICD-10-CM | POA: Insufficient documentation

## 2018-05-01 DIAGNOSIS — E7800 Pure hypercholesterolemia, unspecified: Secondary | ICD-10-CM | POA: Insufficient documentation

## 2018-05-01 DIAGNOSIS — K219 Gastro-esophageal reflux disease without esophagitis: Secondary | ICD-10-CM | POA: Insufficient documentation

## 2018-05-01 DIAGNOSIS — G8929 Other chronic pain: Secondary | ICD-10-CM | POA: Insufficient documentation

## 2018-05-01 DIAGNOSIS — M542 Cervicalgia: Secondary | ICD-10-CM | POA: Insufficient documentation

## 2018-05-01 DIAGNOSIS — Z981 Arthrodesis status: Secondary | ICD-10-CM | POA: Insufficient documentation

## 2018-05-01 DIAGNOSIS — I252 Old myocardial infarction: Secondary | ICD-10-CM | POA: Insufficient documentation

## 2018-05-01 DIAGNOSIS — J45909 Unspecified asthma, uncomplicated: Secondary | ICD-10-CM | POA: Insufficient documentation

## 2018-05-01 DIAGNOSIS — Z87440 Personal history of urinary (tract) infections: Secondary | ICD-10-CM | POA: Insufficient documentation

## 2018-05-01 DIAGNOSIS — M48061 Spinal stenosis, lumbar region without neurogenic claudication: Secondary | ICD-10-CM | POA: Insufficient documentation

## 2018-05-01 DIAGNOSIS — I10 Essential (primary) hypertension: Secondary | ICD-10-CM | POA: Insufficient documentation

## 2018-05-04 DIAGNOSIS — Z7901 Long term (current) use of anticoagulants: Secondary | ICD-10-CM | POA: Diagnosis not present

## 2018-05-04 DIAGNOSIS — I2699 Other pulmonary embolism without acute cor pulmonale: Secondary | ICD-10-CM | POA: Diagnosis not present

## 2018-05-04 DIAGNOSIS — I809 Phlebitis and thrombophlebitis of unspecified site: Secondary | ICD-10-CM | POA: Diagnosis not present

## 2018-05-08 ENCOUNTER — Ambulatory Visit: Payer: Medicare HMO | Admitting: Cardiovascular Disease

## 2018-05-15 DIAGNOSIS — M5416 Radiculopathy, lumbar region: Secondary | ICD-10-CM | POA: Diagnosis not present

## 2018-05-15 DIAGNOSIS — J449 Chronic obstructive pulmonary disease, unspecified: Secondary | ICD-10-CM | POA: Diagnosis not present

## 2018-05-15 DIAGNOSIS — M179 Osteoarthritis of knee, unspecified: Secondary | ICD-10-CM | POA: Diagnosis not present

## 2018-05-18 ENCOUNTER — Ambulatory Visit: Payer: Medicare HMO | Admitting: Cardiology

## 2018-05-21 ENCOUNTER — Encounter: Payer: Self-pay | Admitting: *Deleted

## 2018-06-05 ENCOUNTER — Ambulatory Visit (INDEPENDENT_AMBULATORY_CARE_PROVIDER_SITE_OTHER): Payer: Medicare HMO | Admitting: Adult Health

## 2018-06-05 ENCOUNTER — Encounter: Payer: Self-pay | Admitting: Adult Health

## 2018-06-05 VITALS — BP 136/83 | HR 90 | Ht 63.0 in | Wt 265.1 lb

## 2018-06-05 DIAGNOSIS — Z0000 Encounter for general adult medical examination without abnormal findings: Secondary | ICD-10-CM

## 2018-06-05 DIAGNOSIS — M545 Low back pain, unspecified: Secondary | ICD-10-CM

## 2018-06-05 DIAGNOSIS — J440 Chronic obstructive pulmonary disease with acute lower respiratory infection: Secondary | ICD-10-CM

## 2018-06-05 DIAGNOSIS — J209 Acute bronchitis, unspecified: Secondary | ICD-10-CM

## 2018-06-05 DIAGNOSIS — I2699 Other pulmonary embolism without acute cor pulmonale: Secondary | ICD-10-CM

## 2018-06-05 DIAGNOSIS — I10 Essential (primary) hypertension: Secondary | ICD-10-CM

## 2018-06-05 DIAGNOSIS — R69 Illness, unspecified: Secondary | ICD-10-CM | POA: Diagnosis not present

## 2018-06-05 MED ORDER — ALBUTEROL SULFATE (2.5 MG/3ML) 0.083% IN NEBU: 2.5000 mg | INHALATION_SOLUTION | Freq: Four times a day (QID) | RESPIRATORY_TRACT | 1 refills | Status: AC | PRN

## 2018-06-05 MED ORDER — ONDANSETRON 8 MG PO TBDP: ORAL_TABLET | ORAL | 0 refills | Status: DC

## 2018-06-05 MED ORDER — AMOXICILLIN-POT CLAVULANATE 875-125 MG PO TABS: 1.0000 | ORAL_TABLET | Freq: Two times a day (BID) | ORAL | 0 refills | Status: DC

## 2018-06-05 MED ORDER — SUMATRIPTAN 5 MG/ACT NA SOLN: 1.0000 | NASAL | 2 refills | Status: DC | PRN

## 2018-06-05 NOTE — Patient Instructions (Addendum)
Mediterranean Diet A Mediterranean diet refers to food and lifestyle choices that are based on the traditions of countries located on the [place]. This way of eating has been shown to help prevent certain conditions and improve outcomes for people who have chronic diseases, like kidney disease and heart disease. What are tips for following this plan? Lifestyle  Cook and eat meals together with your family, when possible.  Drink enough fluid to keep your urine clear or pale yellow.  Be physically active every day. This includes: ? Aerobic exercise like running or swimming. ? Leisure activities like gardening, walking, or housework.  Get 7-8 hours of sleep each night.  If recommended by your health care provider, drink red wine in moderation. This means 1 glass a day for nonpregnant women and 2 glasses a day for men. A glass of wine equals 5 oz (150 mL). Reading food labels  Check the serving size of packaged foods. For foods such as rice and pasta, the serving size refers to the amount of cooked product, not dry.  Check the total fat in packaged foods. Avoid foods that have saturated fat or trans fats.  Check the ingredients list for added sugars, such as corn syrup. Shopping  At the grocery store, buy most of your food from the areas near the walls of the store. This includes: ? Fresh fruits and vegetables (produce). ? Grains, beans, nuts, and seeds. Some of these may be available in unpackaged forms or large amounts (in bulk). ? Fresh seafood. ? Poultry and eggs. ? Low-fat dairy products.  Buy whole ingredients instead of prepackaged foods.  Buy fresh fruits and vegetables in-season from local farmers markets.  Buy frozen fruits and vegetables in resealable bags.  If you do not have access to quality fresh seafood, buy precooked frozen shrimp or canned fish, such as tuna, salmon, or sardines.  Buy small amounts of raw or cooked vegetables, salads, or olives from the  deli or salad bar at your store.  Stock your pantry so you always have certain foods on hand, such as olive oil, canned tuna, canned tomatoes, rice, pasta, and beans. Cooking  Cook foods with extra-virgin olive oil instead of using butter or other vegetable oils.  Have meat as a side dish, and have vegetables or grains as your main dish. This means having meat in small portions or adding small amounts of meat to foods like pasta or stew.  Use beans or vegetables instead of meat in common dishes like chili or lasagna.  Experiment with different cooking methods. Try roasting or broiling vegetables instead of steaming or sauteing them.  Add frozen vegetables to soups, stews, pasta, or rice.  Add nuts or seeds for added healthy fat at each meal. You can add these to yogurt, salads, or vegetable dishes.  Marinate fish or vegetables using olive oil, lemon juice, garlic, and fresh herbs. Meal planning  Plan to eat 1 vegetarian meal one day each week. Try to work up to 2 vegetarian meals, if possible.  Eat seafood 2 or more times a week.  Have healthy snacks readily available, such as: ? Vegetable sticks with hummus. ? Greek yogurt. ? Fruit and nut trail mix.  Eat balanced meals throughout the week. This includes: ? Fruit: 2-3 servings a day ? Vegetables: 4-5 servings a day ? Low-fat dairy: 2 servings a day ? Fish, poultry, or lean meat: 1 serving a day ? Beans and legumes: 2 or more servings a week ? Nuts   and seeds: 1-2 servings a day ? Whole grains: 6-8 servings a day ? Extra-virgin olive oil: 3-4 servings a day  Limit red meat and sweets to only a few servings a month What are my food choices?  Mediterranean diet ? Recommended ? Grains: Whole-grain pasta. Brown rice. Bulgar wheat. Polenta. Couscous. Whole-wheat bread. [place]. ? Vegetables: Artichokes. Beets. Broccoli. Cabbage. Carrots. Eggplant. Green beans. Chard. Kale. Spinach. Onions. Leeks. Peas. Squash.  Tomatoes. Peppers. Radishes. ? Fruits: Apples. Apricots. Avocado. Berries. Bananas. Cherries. Dates. Figs. Grapes. Lemons. Melon. Oranges. Peaches. Plums. Pomegranate. ? Meats and other protein foods: Beans. Almonds. Sunflower seeds. Pine nuts. Peanuts. [place]. Salmon. Scallops. Shrimp. [place]. Tilapia. Clams. Oysters. Eggs. ? Dairy: Low-fat milk. Cheese. Greek yogurt. ? Beverages: Water. Red wine. Herbal tea. ? Fats and oils: Extra virgin olive oil. Avocado oil. Grape seed oil. ? Sweets and desserts: [place] yogurt with honey. Baked apples. Poached pears. Trail mix. ? Seasoning and other foods: Basil. Cilantro. Coriander. Cumin. Mint. Parsley. Sage. Rosemary. Tarragon. Garlic. Oregano. Thyme. Pepper. Balsalmic vinegar. Tahini. Hummus. Tomato sauce. Olives. Mushrooms. ? Limit these ? Grains: Prepackaged pasta or rice dishes. Prepackaged cereal with added sugar. ? Vegetables: Deep fried potatoes (french fries). ? Fruits: Fruit canned in syrup. ? Meats and other protein foods: Beef. Pork. Lamb. Poultry with skin. Hot dogs. [place] Salines. ? Dairy: Ice cream. Sour cream. Whole milk. ? Beverages: Juice. Sugar-sweetened soft drinks. Beer. Liquor and spirits. ? Fats and oils: Butter. Canola oil. Vegetable oil. Beef fat (tallow). Lard. ? Sweets and desserts: Cookies. Cakes. Pies. Candy. ? Seasoning and other foods: Mayonnaise. Premade sauces and marinades. ? The items listed may not be a complete list. Talk with your dietitian about what dietary choices are right for you. Summary  The Mediterranean diet includes both food and lifestyle choices.  Eat a variety of fresh fruits and vegetables, beans, nuts, seeds, and whole grains.  Limit the amount of red meat and sweets that you eat.  Talk with your health care provider about whether it is safe for you to drink red wine in moderation. This means 1 glass a day for nonpregnant women and 2 glasses a day for men. A glass of wine equals 5 oz (150 mL). This information  is not intended to replace advice given to you by your health care provider. Make sure you discuss any questions you have with your health care provider. Document Released: 06/02/2016 Document Revised: 07/05/2016 Document Reviewed: 06/02/2016 Elsevier Interactive Patient Education  [employer].  Please continue all medications as directed. Please call cardiology and re-schedule appt. Please call Phys Med/Dr. Letta Pate for pain management- also call his office for refill on pain medications. Please contact Medicare and inquire about possible transportation services that may be available to you. Recommend looking into Grief Share programs at local church's to assist in grief recovery with your father's recent passing. Spoke with home health, re-ordered twice weekly care to assist with activities of daily living. [facility] will fax over most recent INR. Follow-up in 6 months for regular follow-up. Please call with any questions/concerns. NICE TO SEE YOU!

## 2018-06-05 NOTE — Assessment & Plan Note (Signed)
Augmentin given, due to Warfarin unable to give Azithromycin and Doxycycline Neb refilled

## 2018-06-05 NOTE — Progress Notes (Signed)
Subjective:    Patient ID: [name], female    DOB: 09-28-1956, 61 y.o.   MRN: [id number]    : 10/31/17 OV:  [name] is here to establish as a new pt.  She is a pleasant 62 year old female.  PMH:HTN, HL, GERD, Morbid Obesity, Chronic back pain with radiculopathy RLE (back surgeries 1989, 1992, 2009, 12/2016-Dr. Elsner), hx of PE (1984, 2014), Hx of MI (2014, 2017?), Divertculitis, Chronic UTI, chronic narcotic dependence, anxiety, and OSA. She cannot recall last cards visit and reports needing SL nitro last month for CP/anxiety.  She was d/c'd from [facility] >3 years ago and she would like a referral to new clinic.  Current pain level is 7/10, constant and she "hurts everywhere, just everywhere". She denies tobacco use and estimates to drink 1 bottle water/day.  She reports poor appetite since last back surgery and is able to safely ambulate with walker and wears back brace "all the time". Her sister "[name]" is at [facility]  11/28/17 OV: [name] is here for f/u: HTN, chronic back pain- Pain Clinic called/left her VM last Thursday- she has not returned their call. Neurosurgeon called her/left her VM last Thursday as well, she has not returned their call. She reports intermittent chest tightness the last few weeks and required 2 doses of Nitro SL, with the last dose about 7-8 days ago. EKG today- Septal Infarct, age indeterminate- she has hx of MI in 2014 No sig changes from tracing 12/18 She has not been seen by cards in 2-3 years She is having a very difficult time preparing food and eating a balanced diet.  She is also experiencing immobility and sig insomnia r/t back pain Sister at [facility] during [place]  03/07/18 OV: [name] is here for CPE. She has several issues today 1) Uncontrolled chronic pain.  She was seen in ED 02/01/18 for pain control.  She missed appt with Pain Clinic this Monday due to diarrhea.  Next available appt June. 2) Anxiety r/t to her poor health and her  father is in active decline, currently at [facility] 3) She reports that home health has not been by to perform; skilled nursing, OT/PT, ADL care ins weeks 4) She has cards appt end of month, she denies current CP/chest heaviness/palpitations.  She estimates to only need 1-2 doses of Nitro/month She is unable to undress due immobility and pain, completed examination over clothing and around back brace Her sister is at [facility] during [place], "[place]", who works as a Network engineer at [employer]  Healthcare Maintenance: PAP- not indicated due to hysterectomy Mammogram- UTD Colonoscopy- UTD  06/05/18 OV: [name] is here for regular f/u: She is followed by Phys Med/Dr. Letta Pate for chronic pain management, previously [facility]  She did not return for L knee injection as Phys Med- unreliable transportation She missed her cardioloy appt- unreliable transportation She reports that home health still has not resumed- she has sig difficulty with bathing, dressing, feeding herself.  She ambulates safely with walker. She is unable to drive She has been on disability since 1992 r/t back injury She continues to have productive cough (thick/yellow mucus) and copious nasal drainage. She tries to stay hydrated with water and juice She denies tobacco/ETOH use Her father (age 71) passed away 5/69/79 from complications with COPD and renal failure. She is not in therapy nor on antidepressants currently  She reports 1-2 migraines/month- previously treated by [facility]  Patient Care Team  Relationship Specialty Notifications Start End  Mina [name] D, NP PCP - General Family Medicine  10/31/17   Adrian Prows, MD Consulting Physician Cardiology  08/26/15     Patient Active Problem List   Diagnosis Date Noted  . Acute bronchitis with COPD ([place]) 04/11/2018  . Chronic pain due to trauma 04/10/2018  . Osteoarthritis of left knee 04/10/2018  . 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  Lumbar post-laminectomy syndrome 04/10/2018  . Chest tightness 11/28/2017  . Anxiety 11/02/2017  . Healthcare maintenance 10/31/2017  . Chronic bilateral back pain 10/31/2017  . Pulmonary embolism ([place]) 10/31/2017  . COPD (chronic obstructive pulmonary disease) ([place]) 10/31/2017  . Sleep apnea 10/31/2017  . History of Coumadin therapy 10/31/2017  . Lumbar radiculopathy, chronic   . Essential hypertension   . Lumbar back pain with radiculopathy affecting right lower extremity 06/05/2017  . Palliative care by specialist   . Right leg pain 06/02/2017  . Severe low back pain 06/02/2017  . Acute right-sided low back pain with sciatica   . Chronic pulmonary embolism ([place]) 04/17/2013  . UTI (urinary tract infection) 04/17/2013  . Diverticulitis of sigmoid colon 04/16/2013  . Obesity, Class III, BMI 40-49.9 (morbid obesity) ([place]) 04/16/2013  . Chronic narcotic dependence ([place]) 04/16/2013  . Asthma 11/16/2011  . GERD (gastroesophageal reflux disease) 11/16/2011  . Hyperlipidemia 11/16/2011     Past Medical History:  Diagnosis Date  . Acid reflux   . Asthma   . Chronic pain   . COPD (chronic obstructive pulmonary disease) ([place])   . Diverticulitis   . Heart murmur   . Hiatal hernia   . High cholesterol   . History of UTI   . Hypertension   . Labral tear of shoulder    left  . Myocardial infarction ([place]) 2014  . Pneumonia 2014  . Pulmonary embolism ([place]) last 2014   x 3   . Ruptured disc, cervical    1 in neck, 1 in midback and 1 in low back, 6 bulging discs,  upper and lower back, 5 slipped discs all the way uop and down back and   . Sleep apnea    uses cpap, pt does not know settings     Past Surgical History:  Procedure Laterality Date  .  bone spurs removed from feet Bilateral   . ABDOMINAL HYSTERECTOMY    . BACK SURGERY     fusion x 2 lower back, laser surgery mid and back and neck also done  . BREAST BIOPSY Right   . CARPAL TUNNEL RELEASE Right   . CHOLECYSTECTOMY     . colon polyps    . COLONOSCOPY WITH PROPOFOL N/A 04/08/2016   Procedure: COLONOSCOPY WITH PROPOFOL;  Surgeon: Carol Ada, MD;  Location: [facility] END[facility]COPY;  Service: Endoscopy;  Laterality: N/A;  . colonscopy     x 2, with polyps removed both times  . IR FL GUIDED LOC OF NEEDLE/CATH TIP FOR SPINAL INJECTION RT  06/04/2017  . JOINT REPLACEMENT    . REPLACEMENT TOTAL KNEE Right   . TUBAL LIGATION       Family History  Problem Relation Age of Onset  . Heart failure Mother   . COPD Mother   . Heart failure Father   . COPD Father   . Cancer Other   . Heart failure Other   . Breast cancer Maternal Aunt   . Breast cancer Paternal Aunt   . Breast cancer Cousin  Social History   Substance and Sexual Activity  Drug Use No     Social History   Substance and Sexual Activity  Alcohol Use No     Social History   Tobacco Use  Smoking Status Never Smoker  Smokeless Tobacco Never Used     Outpatient Encounter Medications as of 06/05/2018  Medication Sig Note  . albuterol (PROVENTIL HFA;VENTOLIN HFA) 108 (90 Base) MCG/ACT inhaler Inhale 2 puffs into the lungs every 6 (six) hours as needed. For wheezing   . albuterol (PROVENTIL) (2.5 MG/3ML) 0.083% nebulizer solution Take 3 mLs (2.5 mg total) by nebulization every 6 (six) hours as needed for wheezing or shortness of breath.   Marland Kitchen aspirin EC 81 MG tablet Take 81 mg by mouth daily at 12 noon.    . diclofenac sodium (VOLTAREN) 1 % GEL Apply 2 g topically 4 (four) times daily as needed (For pain in shoulder, knees, back and neck.).    Marland Kitchen furosemide (LASIX) 40 MG tablet TAKE 1 TABLET (40 MG TOTAL) BY MOUTH 2 (TWO) TIMES DAILY AS NEEDED FOR FLUID.   Marland Kitchen Hypromellose (ARTIFICIAL TEARS OP) Place 1 drop into both eyes daily as needed (dry eyes).   Marland Kitchen lactulose (CHRONULAC) 10 GM/15ML solution Take 15 mLs (10 g total) by mouth 2 (two) times daily. 04/10/2018: Only if needed  . lidocaine (LIDODERM) 5 % Place 1-3 patches onto the skin daily.  Remove & Discard patch within 12 hours or as directed by MD   . losartan (COZAAR) 50 MG tablet TAKE 1 TABLET BY MOUTH DAILY AS NEEDED FOR BLOOD PRESSURE   . methocarbamol (ROBAXIN) 500 MG tablet Take 1 tablet (500 mg total) by mouth every 6 (six) hours as needed for muscle spasms.   . metoprolol succinate (TOPROL-XL) 50 MG 24 hr tablet TAKE 1 TABLET (50 MG TOTAL) BY MOUTH DAILY. TAKE WITH OR IMMEDIATELY FOLLOWING A MEAL.   . nitroGLYCERIN (NITROSTAT) 0.4 MG SL tablet Place 1 tablet (0.4 mg total) under the tongue every 5 (five) minutes as needed for chest pain.   Marland Kitchen nystatin [facility]) powder Apply 1 g topically daily as needed (rash).   Marland Kitchen omeprazole (PRILOSEC) 40 MG capsule TAKE 1 CAPSULE BY MOUTH TWICE A DAY   . ondansetron (ZOFRAN ODT) 8 MG disintegrating tablet 8mg  ODT q4 hours prn nausea   . Oxycodone HCl 10 MG TABS Take 1 tablet (10 mg total) by mouth every 6 (six) hours as needed (for severe pain). 04/10/2018: Fill date 02/09/18 #20  Today #8  last dose over 4 weeks ago  . oxyCODONE-acetaminophen (PERCOCET) 10-325 MG tablet Take 1 tablet by mouth every 6 (six) hours as needed for pain. 04/10/2018: Fill date 02/05/18 # 15  today #4  last dose about 4 days ago  . pravastatin (PRAVACHOL) 40 MG tablet TAKE 1 TABLET BY MOUTH DAILY AT 12 NOON.   Marland Kitchen pregabalin (LYRICA) 25 MG capsule Take 1 capsule (25 mg total) by mouth 2 (two) times daily.   Marland Kitchen trimethoprim (TRIMPEX) 100 MG tablet Take 100 mg by mouth daily.   . Vitamin D, Ergocalciferol, (DRISDOL) 50000 units CAPS capsule Take 50,000 Units by mouth every 7 (seven) days. 06/02/2017: Fridays  . warfarin (COUMADIN) 5 MG tablet TAKE 1 TABLET ON MONDAY,WEDNESDAY,FRIDAY AND A 1/2 TABLET THE REST OF THE WEEK   . [DISCONTINUED] albuterol (PROVENTIL) (2.5 MG/3ML) 0.083% nebulizer solution Take 3 mLs (2.5 mg total) by nebulization every 6 (six) hours as needed for wheezing or shortness of breath.   . [  DISCONTINUED] ondansetron (ZOFRAN ODT) 8 MG disintegrating tablet  8mg  ODT q4 hours prn nausea   . amoxicillin-clavulanate (AUGMENTIN) 875-125 MG tablet Take 1 tablet by mouth 2 (two) times daily.   . SUMAtriptan (IMITREX) 5 MG/ACT nasal spray Place 1 spray (5 mg total) into the nose every 2 (two) hours as needed for migraine. Max daily dose 2 sprays/day   . [DISCONTINUED] amoxicillin-clavulanate (AUGMENTIN) 875-125 MG tablet Take 1 tablet by mouth 2 (two) times daily.   . [DISCONTINUED] chlorpheniramine-HYDROcodone (TUSSIONEX) 10-8 MG/5ML SUER Take 5 mLs by mouth every 12 (twelve) hours as needed.   . [DISCONTINUED] diazepam (VALIUM) 10 MG tablet Take 10 mg by mouth at bedtime as needed for anxiety. 04/10/2018: Fill date 02/08/18 #30  today #5  last dose probably 4 weeks ago  . [DISCONTINUED] sertraline (ZOLOFT) 50 MG tablet Take one tablet by mouth daily (Patient not taking: Reported on 04/11/2018)    No facility-administered encounter medications on file as of 06/05/2018.     Allergies: Latex; Other; Shellfish allergy; and Tape  Body mass index is 46.96 kg/m.  Blood pressure 136/83, pulse 90, height 5\' 3"  (1.6 m), weight 265 lb 1.6 oz (120.2 kg), SpO2 96 %.   Review of Systems  Constitutional: Positive for activity change, appetite change and fatigue. Negative for chills, diaphoresis, fever and unexpected weight change.  HENT: Negative for congestion.   Eyes: Negative for visual disturbance.  Respiratory: Negative for cough, chest tightness, shortness of breath, wheezing and stridor.   Cardiovascular: Negative for chest pain, palpitations and leg swelling.  Gastrointestinal: Negative for abdominal distention, abdominal pain, blood in stool, constipation, diarrhea, nausea and vomiting.  Genitourinary: Negative for difficulty urinating and flank pain.  Musculoskeletal: Positive for arthralgias, back pain, gait problem, joint swelling, myalgias, neck pain and neck stiffness.  Neurological: Negative for dizziness and headaches.  Hematological: Does not  bruise/bleed easily.  Psychiatric/Behavioral: Positive for dysphoric mood and sleep disturbance. The patient is nervous/anxious.        Objective:   Physical Exam  Constitutional: She is oriented to person, place, and time. She appears well-developed and well-nourished. No distress.  HENT:  Head: Normocephalic and atraumatic.  Right Ear: External ear normal. Tympanic membrane is not erythematous and not bulging. No decreased hearing is noted.  Left Ear: External ear normal. Tympanic membrane is not erythematous and not bulging. No decreased hearing is noted.  Nose: No mucosal edema or rhinorrhea. Right sinus exhibits no maxillary sinus tenderness and no frontal sinus tenderness. Left sinus exhibits no maxillary sinus tenderness and no frontal sinus tenderness.  Mouth/Throat: Oropharynx is clear and moist and mucous membranes are normal. Abnormal dentition.  Eyes: Pupils are equal, round, and reactive to light. Conjunctivae are normal.  Neck: Normal range of motion. Neck supple.  Cardiovascular: Normal rate, regular rhythm, normal heart sounds and intact distal pulses.  No murmur heard. Pulmonary/Chest: Breath sounds normal. No respiratory distress. She has no decreased breath sounds. She has no wheezes. She has no rhonchi. She has no rales. She exhibits no tenderness.  Strong cough noted throughout encounter   Abdominal: Soft. Bowel sounds are normal. She exhibits no distension and no mass. There is no tenderness. There is no rebound and no guarding.  Genitourinary:  Genitourinary Comments: Declined examination  Musculoskeletal: She exhibits edema and tenderness.       Left shoulder: She exhibits tenderness.       Right hip: She exhibits tenderness.       Left hip: She exhibits  tenderness.       Right knee: Tenderness found.       Left knee: Tenderness found.       Right ankle: Tenderness.       Left ankle: Tenderness.       Cervical back: She exhibits tenderness.       Thoracic back:  She exhibits tenderness.       Lumbar back: She exhibits tenderness.  Lymphadenopathy:    She has no cervical adenopathy.  Neurological: She is alert and oriented to person, place, and time.  Skin: Skin is warm and dry. No rash noted. She is not diaphoretic. No erythema. There is pallor.  Psychiatric: Her speech is normal and behavior is normal. Judgment and thought content normal. Her affect is blunt. Cognition and memory are normal.      Assessment & Plan:   1. Essential hypertension   2. Other pulmonary embolism without acute cor pulmonale, unspecified chronicity ([place])   3. Healthcare maintenance   4. Acute bronchitis with COPD ([place])     Essential hypertension BP at goal 136/83, HR 90 Continue with losartan 50mg  QD, Toprol XL 50mg  QD Advised to make appt wth cards  Pulmonary embolism ([place]) Lifelong anti-coagulation Most recent INR requested from [facility] Currently on Warfarin 5mg  - 1 tab M/W/F, 1/2 other days Denies unusual bruising/bleeding  Healthcare maintenance Please continue all medications as directed. Please call cardiology and re-schedule appt. Please call Phys Med/Dr. Letta Pate for pain management. Please contact Medicare and inquire about possible transportation services that may be available to you. Recommend looking into Grief Share programs at local church's to assist in grief recovery with your father's recent passing. Spoke with home health, re-ordered twice weekly care to assist with activities of daily living. [facility] will fax over most recent INR. Follow-up in 6 months for regular follow-up. Please call with any questions/concerns.  Acute bronchitis with COPD ([place]) Augmentin given, due to Warfarin unable to give Azithromycin and Doxycycline Neb refilled    FOLLOW-UP:  Return in about 3 months (around 09/05/2018) for Regular Follow Up, Obesity, HTN.

## 2018-06-05 NOTE — Assessment & Plan Note (Signed)
Please continue all medications as directed. Please call cardiology and re-schedule appt. Please call Phys Med/Dr. Letta Pate for pain management. Please contact Medicare and inquire about possible transportation services that may be available to you. Recommend looking into Grief Share programs at local church's to assist in grief recovery with your father's recent passing. Spoke with home health, re-ordered twice weekly care to assist with activities of daily living. [facility] will fax over most recent INR. Follow-up in 6 months for regular follow-up. Please call with any questions/concerns.

## 2018-06-05 NOTE — Assessment & Plan Note (Signed)
Lifelong anti-coagulation Most recent INR requested from [facility] Currently on Warfarin 5mg  - 1 tab M/W/F, 1/2 other days Denies unusual bruising/bleeding

## 2018-06-05 NOTE — Assessment & Plan Note (Signed)
BP at goal 136/83, HR 90 Continue with losartan 50mg  QD, Toprol XL 50mg  QD Advised to make appt wth cards

## 2018-06-05 NOTE — Addendum Note (Signed)
Addended by: Fonnie Mu on: 06/05/2018 03:09 PM   Modules accepted: Orders

## 2018-06-05 NOTE — Addendum Note (Signed)
Addended by: Fonnie Mu on: 06/05/2018 03:23 PM   Modules accepted: Orders

## 2018-06-11 ENCOUNTER — Encounter: Payer: Medicare HMO | Admitting: Psychology

## 2018-06-11 ENCOUNTER — Telehealth: Payer: Self-pay | Admitting: Adult Health

## 2018-06-11 DIAGNOSIS — M5441 Lumbago with sciatica, right side: Secondary | ICD-10-CM | POA: Diagnosis not present

## 2018-06-11 DIAGNOSIS — M1712 Unilateral primary osteoarthritis, left knee: Secondary | ICD-10-CM | POA: Diagnosis not present

## 2018-06-11 DIAGNOSIS — I2782 Chronic pulmonary embolism: Secondary | ICD-10-CM | POA: Diagnosis not present

## 2018-06-11 DIAGNOSIS — M5416 Radiculopathy, lumbar region: Secondary | ICD-10-CM | POA: Diagnosis not present

## 2018-06-11 DIAGNOSIS — J449 Chronic obstructive pulmonary disease, unspecified: Secondary | ICD-10-CM | POA: Diagnosis not present

## 2018-06-11 DIAGNOSIS — M961 Postlaminectomy syndrome, not elsewhere classified: Secondary | ICD-10-CM | POA: Diagnosis not present

## 2018-06-11 DIAGNOSIS — I10 Essential (primary) hypertension: Secondary | ICD-10-CM | POA: Diagnosis not present

## 2018-06-11 DIAGNOSIS — G8929 Other chronic pain: Secondary | ICD-10-CM | POA: Diagnosis not present

## 2018-06-11 DIAGNOSIS — R69 Illness, unspecified: Secondary | ICD-10-CM | POA: Diagnosis not present

## 2018-06-11 NOTE — Telephone Encounter (Signed)
LVM for Dan Europe to return call.  Charyl Bigger, CMA

## 2018-06-11 NOTE — Telephone Encounter (Signed)
Tatiana with [facility] is requesting several verbal orders for patient (she was in bad cell service area so we had a hard time understanding each other, but she will be requesting verbal orders for skilled nursing, PT/OT, among others when you call). She can be reached at [phone].

## 2018-06-12 NOTE — Telephone Encounter (Signed)
Orders given to [facility] for PT/OT and skilled nursing.  Charyl Bigger, CMA

## 2018-06-15 ENCOUNTER — Telehealth: Payer: Self-pay | Admitting: Physical Medicine & Rehabilitation

## 2018-06-15 DIAGNOSIS — M179 Osteoarthritis of knee, unspecified: Secondary | ICD-10-CM | POA: Diagnosis not present

## 2018-06-15 DIAGNOSIS — M1712 Unilateral primary osteoarthritis, left knee: Secondary | ICD-10-CM | POA: Diagnosis not present

## 2018-06-15 DIAGNOSIS — I10 Essential (primary) hypertension: Secondary | ICD-10-CM | POA: Diagnosis not present

## 2018-06-15 DIAGNOSIS — I2782 Chronic pulmonary embolism: Secondary | ICD-10-CM | POA: Diagnosis not present

## 2018-06-15 DIAGNOSIS — M961 Postlaminectomy syndrome, not elsewhere classified: Secondary | ICD-10-CM | POA: Diagnosis not present

## 2018-06-15 DIAGNOSIS — M5441 Lumbago with sciatica, right side: Secondary | ICD-10-CM | POA: Diagnosis not present

## 2018-06-15 DIAGNOSIS — J449 Chronic obstructive pulmonary disease, unspecified: Secondary | ICD-10-CM | POA: Diagnosis not present

## 2018-06-15 DIAGNOSIS — G8929 Other chronic pain: Secondary | ICD-10-CM | POA: Diagnosis not present

## 2018-06-15 DIAGNOSIS — M5416 Radiculopathy, lumbar region: Secondary | ICD-10-CM | POA: Diagnosis not present

## 2018-06-15 DIAGNOSIS — R69 Illness, unspecified: Secondary | ICD-10-CM | POA: Diagnosis not present

## 2018-06-15 MED ORDER — METHOCARBAMOL 500 MG PO TABS: 500.0000 mg | ORAL_TABLET | Freq: Four times a day (QID) | ORAL | 1 refills | Status: DC | PRN

## 2018-06-15 MED ORDER — PREGABALIN 25 MG PO CAPS: 25.0000 mg | ORAL_CAPSULE | Freq: Two times a day (BID) | ORAL | 1 refills | Status: DC

## 2018-06-15 NOTE — Telephone Encounter (Signed)
Patient needs refills for the following Robaxin Lyrica  Thanks Judeen Hammans

## 2018-06-18 DIAGNOSIS — J449 Chronic obstructive pulmonary disease, unspecified: Secondary | ICD-10-CM | POA: Diagnosis not present

## 2018-06-18 DIAGNOSIS — M1712 Unilateral primary osteoarthritis, left knee: Secondary | ICD-10-CM | POA: Diagnosis not present

## 2018-06-18 DIAGNOSIS — I10 Essential (primary) hypertension: Secondary | ICD-10-CM | POA: Diagnosis not present

## 2018-06-18 DIAGNOSIS — M961 Postlaminectomy syndrome, not elsewhere classified: Secondary | ICD-10-CM | POA: Diagnosis not present

## 2018-06-18 DIAGNOSIS — M5416 Radiculopathy, lumbar region: Secondary | ICD-10-CM | POA: Diagnosis not present

## 2018-06-18 DIAGNOSIS — G8929 Other chronic pain: Secondary | ICD-10-CM | POA: Diagnosis not present

## 2018-06-18 DIAGNOSIS — M5441 Lumbago with sciatica, right side: Secondary | ICD-10-CM | POA: Diagnosis not present

## 2018-06-18 DIAGNOSIS — I2782 Chronic pulmonary embolism: Secondary | ICD-10-CM | POA: Diagnosis not present

## 2018-06-18 DIAGNOSIS — R69 Illness, unspecified: Secondary | ICD-10-CM | POA: Diagnosis not present

## 2018-06-19 ENCOUNTER — Telehealth: Payer: Self-pay

## 2018-06-19 NOTE — Telephone Encounter (Signed)
Tillie Rung, PT with [facility], called to give an update on pt. And how PT is progressing.  Tillie Rung states that the patient has been in too much pain to participate in PT.  Pt is currently out of her pain medications, but has appt with pain management on 07/06/18 and wishes to hold PT until after she is given another refill on her medications.  Charyl Bigger, CMA

## 2018-06-20 DIAGNOSIS — M5441 Lumbago with sciatica, right side: Secondary | ICD-10-CM | POA: Diagnosis not present

## 2018-06-20 DIAGNOSIS — R69 Illness, unspecified: Secondary | ICD-10-CM | POA: Diagnosis not present

## 2018-06-20 DIAGNOSIS — M5416 Radiculopathy, lumbar region: Secondary | ICD-10-CM | POA: Diagnosis not present

## 2018-06-20 DIAGNOSIS — G8929 Other chronic pain: Secondary | ICD-10-CM | POA: Diagnosis not present

## 2018-06-20 DIAGNOSIS — J449 Chronic obstructive pulmonary disease, unspecified: Secondary | ICD-10-CM | POA: Diagnosis not present

## 2018-06-20 DIAGNOSIS — I10 Essential (primary) hypertension: Secondary | ICD-10-CM | POA: Diagnosis not present

## 2018-06-20 DIAGNOSIS — I2782 Chronic pulmonary embolism: Secondary | ICD-10-CM | POA: Diagnosis not present

## 2018-06-20 DIAGNOSIS — M961 Postlaminectomy syndrome, not elsewhere classified: Secondary | ICD-10-CM | POA: Diagnosis not present

## 2018-06-20 DIAGNOSIS — M1712 Unilateral primary osteoarthritis, left knee: Secondary | ICD-10-CM | POA: Diagnosis not present

## 2018-06-26 DIAGNOSIS — J449 Chronic obstructive pulmonary disease, unspecified: Secondary | ICD-10-CM | POA: Diagnosis not present

## 2018-06-26 DIAGNOSIS — G8929 Other chronic pain: Secondary | ICD-10-CM | POA: Diagnosis not present

## 2018-06-26 DIAGNOSIS — I2782 Chronic pulmonary embolism: Secondary | ICD-10-CM | POA: Diagnosis not present

## 2018-06-26 DIAGNOSIS — M5416 Radiculopathy, lumbar region: Secondary | ICD-10-CM | POA: Diagnosis not present

## 2018-06-26 DIAGNOSIS — I10 Essential (primary) hypertension: Secondary | ICD-10-CM | POA: Diagnosis not present

## 2018-06-26 DIAGNOSIS — M1712 Unilateral primary osteoarthritis, left knee: Secondary | ICD-10-CM | POA: Diagnosis not present

## 2018-06-26 DIAGNOSIS — M961 Postlaminectomy syndrome, not elsewhere classified: Secondary | ICD-10-CM | POA: Diagnosis not present

## 2018-06-26 DIAGNOSIS — M5441 Lumbago with sciatica, right side: Secondary | ICD-10-CM | POA: Diagnosis not present

## 2018-06-26 DIAGNOSIS — R69 Illness, unspecified: Secondary | ICD-10-CM | POA: Diagnosis not present

## 2018-06-27 DIAGNOSIS — J449 Chronic obstructive pulmonary disease, unspecified: Secondary | ICD-10-CM | POA: Diagnosis not present

## 2018-06-27 DIAGNOSIS — I10 Essential (primary) hypertension: Secondary | ICD-10-CM | POA: Diagnosis not present

## 2018-06-27 DIAGNOSIS — R69 Illness, unspecified: Secondary | ICD-10-CM | POA: Diagnosis not present

## 2018-06-27 DIAGNOSIS — I2782 Chronic pulmonary embolism: Secondary | ICD-10-CM | POA: Diagnosis not present

## 2018-06-27 DIAGNOSIS — G8929 Other chronic pain: Secondary | ICD-10-CM | POA: Diagnosis not present

## 2018-06-27 DIAGNOSIS — M961 Postlaminectomy syndrome, not elsewhere classified: Secondary | ICD-10-CM | POA: Diagnosis not present

## 2018-06-27 DIAGNOSIS — M5416 Radiculopathy, lumbar region: Secondary | ICD-10-CM | POA: Diagnosis not present

## 2018-06-27 DIAGNOSIS — M1712 Unilateral primary osteoarthritis, left knee: Secondary | ICD-10-CM | POA: Diagnosis not present

## 2018-06-27 DIAGNOSIS — M5441 Lumbago with sciatica, right side: Secondary | ICD-10-CM | POA: Diagnosis not present

## 2018-06-28 DIAGNOSIS — M1712 Unilateral primary osteoarthritis, left knee: Secondary | ICD-10-CM | POA: Diagnosis not present

## 2018-06-28 DIAGNOSIS — G8929 Other chronic pain: Secondary | ICD-10-CM | POA: Diagnosis not present

## 2018-06-28 DIAGNOSIS — M5441 Lumbago with sciatica, right side: Secondary | ICD-10-CM | POA: Diagnosis not present

## 2018-06-28 DIAGNOSIS — J449 Chronic obstructive pulmonary disease, unspecified: Secondary | ICD-10-CM | POA: Diagnosis not present

## 2018-06-28 DIAGNOSIS — M5416 Radiculopathy, lumbar region: Secondary | ICD-10-CM | POA: Diagnosis not present

## 2018-06-28 DIAGNOSIS — M961 Postlaminectomy syndrome, not elsewhere classified: Secondary | ICD-10-CM | POA: Diagnosis not present

## 2018-06-28 DIAGNOSIS — I10 Essential (primary) hypertension: Secondary | ICD-10-CM | POA: Diagnosis not present

## 2018-06-28 DIAGNOSIS — I2782 Chronic pulmonary embolism: Secondary | ICD-10-CM | POA: Diagnosis not present

## 2018-06-28 DIAGNOSIS — R69 Illness, unspecified: Secondary | ICD-10-CM | POA: Diagnosis not present

## 2018-06-28 NOTE — Progress Notes (Signed)
Subjective:    Patient ID: [name], female    DOB: 08-22-1956, 62 y.o.   MRN: [id number]  HPI:  Ms. Hackworth presents with urinary frequency, dysuria, bladder fullness, voiding small amounts, and lower abdominal pain (2/10, dull ache) that all started >1 week ago. She has been pushing fluids and sx's continue to worsen. She also reports uncontrolled back pain- constant stabbing pain that begins mid lower back and will radiate R lower back. Her next OV at [facility] management is 07/06/18, she reports OTC NSAIDs and OTC Acetaminophen "doesn't touch the pain".  Patient Care Team    Relationship Specialty Notifications Start End  Mina [name] D, NP PCP - General Family Medicine  10/31/17   Adrian Prows, MD Consulting Physician Cardiology  08/26/15     Patient Active Problem List   Diagnosis Date Noted  . Deep venous thrombosis ([place]) 06/29/2018  . Urinary frequency 06/29/2018  . Acute bronchitis with COPD ([place]) 04/11/2018  . Chronic pain due to trauma 04/10/2018  . Osteoarthritis of left knee 04/10/2018  . 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 Lumbar post-laminectomy syndrome 04/10/2018  . Chest tightness 11/28/2017  . Anxiety 11/02/2017  . Healthcare maintenance 10/31/2017  . Chronic bilateral back pain 10/31/2017  . Pulmonary embolism ([place]) 10/31/2017  . COPD (chronic obstructive pulmonary disease) ([place]) 10/31/2017  . Sleep apnea 10/31/2017  . History of Coumadin therapy 10/31/2017  . Lumbar radiculopathy, chronic   . Essential hypertension   . Lumbar back pain with radiculopathy affecting right lower extremity 06/05/2017  . Palliative care by specialist   . Right leg pain 06/02/2017  . Severe low back pain 06/02/2017  . Acute right-sided low back pain with sciatica   . Recurrent UTI 05/10/2016  . Urinary incontinence 12/30/2015  . Chronic pulmonary embolism ([place]) 04/17/2013  . UTI (urinary tract infection) 04/17/2013  . Diverticulitis of  sigmoid colon 04/16/2013  . Obesity, Class III, BMI 40-49.9 (morbid obesity) ([place]) 04/16/2013  . Chronic narcotic dependence ([place]) 04/16/2013  . Asthma 11/16/2011  . GERD (gastroesophageal reflux disease) 11/16/2011  . Hyperlipidemia 11/16/2011     Past Medical History:  Diagnosis Date  . Acid reflux   . Asthma   . Chronic pain   . COPD (chronic obstructive pulmonary disease) ([place])   . Diverticulitis   . Heart murmur   . Hiatal hernia   . High cholesterol   . History of UTI   . Hypertension   . Labral tear of shoulder    left  . Myocardial infarction ([place]) 2014  . Pneumonia 2014  . Pulmonary embolism ([place]) last 2014   x 3   . Ruptured disc, cervical    1 in neck, 1 in midback and 1 in low back, 6 bulging discs,  upper and lower back, 5 slipped discs all the way uop and down back and   . Sleep apnea    uses cpap, pt does not know settings     Past Surgical History:  Procedure Laterality Date  .  bone spurs removed from feet Bilateral   . ABDOMINAL HYSTERECTOMY    . BACK SURGERY     fusion x 2 lower back, laser surgery mid and back and neck also done  . BREAST BIOPSY Right   . CARPAL TUNNEL RELEASE Right   . CHOLECYSTECTOMY    . colon polyps    . COLONOSCOPY WITH PROPOFOL N/A 04/08/2016   Procedure: COLONOSCOPY WITH PROPOFOL;  Surgeon: Carol Ada, MD;  Location: [facility];  Service: Endoscopy;  Laterality: N/A;  . colonscopy     x 2, with polyps removed both times  . IR FL GUIDED LOC OF NEEDLE/CATH TIP FOR SPINAL INJECTION RT  06/04/2017  . JOINT REPLACEMENT    . REPLACEMENT TOTAL KNEE Right   . TUBAL LIGATION       Family History  Problem Relation Age of Onset  . Heart failure Mother   . COPD Mother   . Heart failure Father   . COPD Father   . Cancer Other   . Heart failure Other   . Breast cancer Maternal Aunt   . Breast cancer Paternal Aunt   . Breast cancer Cousin      Social History   Substance and Sexual Activity  Drug Use No      Social History   Substance and Sexual Activity  Alcohol Use No     Social History   Tobacco Use  Smoking Status Never Smoker  Smokeless Tobacco Never Used     Outpatient Encounter Medications as of 06/29/2018  Medication Sig Note  . albuterol (PROVENTIL HFA;VENTOLIN HFA) 108 (90 Base) MCG/ACT inhaler Inhale 2 puffs into the lungs every 6 (six) hours as needed. For wheezing   . albuterol (PROVENTIL) (2.5 MG/3ML) 0.083% nebulizer solution Take 3 mLs (2.5 mg total) by nebulization every 6 (six) hours as needed for wheezing or shortness of breath.   Marland Kitchen amoxicillin-clavulanate (AUGMENTIN) 875-125 MG tablet Take 1 tablet by mouth 2 (two) times daily.   Marland Kitchen aspirin EC 81 MG tablet Take 81 mg by mouth daily at 12 noon.    . diclofenac sodium (VOLTAREN) 1 % GEL Apply 2 g topically 4 (four) times daily as needed (For pain in shoulder, knees, back and neck.).    Marland Kitchen furosemide (LASIX) 40 MG tablet TAKE 1 TABLET (40 MG TOTAL) BY MOUTH 2 (TWO) TIMES DAILY AS NEEDED FOR FLUID.   Marland Kitchen Hypromellose (ARTIFICIAL TEARS OP) Place 1 drop into both eyes daily as needed (dry eyes).   Marland Kitchen lactulose (CHRONULAC) 10 GM/15ML solution Take 15 mLs (10 g total) by mouth 2 (two) times daily. 04/10/2018: Only if needed  . lidocaine (LIDODERM) 5 % Place 1-3 patches onto the skin daily. Remove & Discard patch within 12 hours or as directed by MD   . losartan (COZAAR) 50 MG tablet TAKE 1 TABLET BY MOUTH DAILY AS NEEDED FOR BLOOD PRESSURE   . methocarbamol (ROBAXIN) 500 MG tablet Take 1 tablet (500 mg total) by mouth every 6 (six) hours as needed for muscle spasms.   . metoprolol succinate (TOPROL-XL) 50 MG 24 hr tablet TAKE 1 TABLET (50 MG TOTAL) BY MOUTH DAILY. TAKE WITH OR IMMEDIATELY FOLLOWING A MEAL.   . nitroGLYCERIN (NITROSTAT) 0.4 MG SL tablet Place 1 tablet (0.4 mg total) under the tongue every 5 (five) minutes as needed for chest pain.   Marland Kitchen nystatin [facility]) powder Apply 1 g topically daily as needed (rash).   Marland Kitchen  omeprazole (PRILOSEC) 40 MG capsule TAKE 1 CAPSULE BY MOUTH TWICE A DAY   . ondansetron (ZOFRAN ODT) 8 MG disintegrating tablet 8mg  ODT q4 hours prn nausea   . Oxycodone HCl 10 MG TABS Take 1 tablet (10 mg total) by mouth every 6 (six) hours as needed (for severe pain). 04/10/2018: Fill date 02/09/18 #20  Today #8  last dose over 4 weeks ago  . oxyCODONE-acetaminophen (PERCOCET) 10-325 MG tablet Take 1 tablet by mouth  every 6 (six) hours as needed for pain. 04/10/2018: Fill date 02/05/18 # 15  today #4  last dose about 4 days ago  . pravastatin (PRAVACHOL) 40 MG tablet TAKE 1 TABLET BY MOUTH DAILY AT 12 NOON.   Marland Kitchen pregabalin (LYRICA) 25 MG capsule Take 1 capsule (25 mg total) by mouth 2 (two) times daily.   . SUMAtriptan (IMITREX) 5 MG/ACT nasal spray Place 1 spray (5 mg total) into the nose every 2 (two) hours as needed for migraine. Max daily dose 2 sprays/day   . trimethoprim (TRIMPEX) 100 MG tablet Take 100 mg by mouth daily.   . Vitamin D, Ergocalciferol, (DRISDOL) 50000 units CAPS capsule Take 50,000 Units by mouth every 7 (seven) days. 06/02/2017: Fridays  . warfarin (COUMADIN) 5 MG tablet TAKE 1 TABLET ON MONDAY,WEDNESDAY,FRIDAY AND A 1/2 TABLET THE REST OF THE WEEK   . meloxicam (MOBIC) 7.5 MG tablet Take 1 tablet (7.5 mg total) by mouth daily.   . nitrofurantoin, macrocrystal-monohydrate, (MACROBID) 100 MG capsule Take 1 capsule (100 mg total) by mouth 2 (two) times daily.    No facility-administered encounter medications on file as of 06/29/2018.     Allergies: Latex; Other; Shellfish allergy; and Tape  Body mass index is 46.71 kg/m.  Blood pressure 135/85, pulse 98, height 5\' 3"  (1.6 m), weight 263 lb 11.2 oz (119.6 kg), SpO2 99 %.     Review of Systems  Constitutional: Positive for activity change and fatigue. Negative for appetite change, chills, diaphoresis, fever and unexpected weight change.  Respiratory: Negative for cough, chest tightness, shortness of breath, wheezing and  stridor.   Cardiovascular: Negative for chest pain, palpitations and leg swelling.  Gastrointestinal: Positive for abdominal pain. Negative for abdominal distention, constipation, diarrhea, nausea and vomiting.  Endocrine: Negative for cold intolerance, heat intolerance, polydipsia, polyphagia and polyuria.  Genitourinary: Negative for difficulty urinating, flank pain and hematuria.  Musculoskeletal: Positive for arthralgias, back pain, gait problem, joint swelling, myalgias and neck stiffness. Negative for neck pain.  Skin: Positive for pallor. Negative for color change, rash and wound.       Her baseline is pale complexion  Neurological: Negative for dizziness and headaches.  Hematological: Does not bruise/bleed easily.  Psychiatric/Behavioral: Positive for sleep disturbance. Negative for behavioral problems, confusion, decreased concentration, dysphoric mood, hallucinations, self-injury and suicidal ideas. The patient is not nervous/anxious and is not hyperactive.       Objective:   Physical Exam  Constitutional: She is oriented to person, place, and time. She appears well-developed and well-nourished. She has a sickly appearance.  Cardiovascular: Normal rate, regular rhythm, normal heart sounds and intact distal pulses.  Pulmonary/Chest: Effort normal and breath sounds normal. No stridor. No respiratory distress. She has no wheezes. She has no rales.  Abdominal: Soft. Bowel sounds are normal. She exhibits no distension and no mass. There is no tenderness. There is no rebound and no guarding. No hernia.  Musculoskeletal: She exhibits edema and tenderness.       Lumbar back: She exhibits pain.  Neurological: She is alert and oriented to person, place, and time.  Skin: Skin is warm and dry. Capillary refill takes less than 2 seconds. No rash noted. She is not diaphoretic. No erythema. There is pallor.  Pale complexion is her baseline  Psychiatric: Her speech is normal and behavior is normal.  Judgment and thought content normal. Cognition and memory are normal. She exhibits a depressed mood.  Slumped over walker, rocking due to discomfort  Nursing note  and vitals reviewed.     Assessment & Plan:   1. Pelvic pain   2. Urinary frequency   3. Urinary urgency   4. Flank pain   5. Abnormal urinalysis   6. Lumbar radiculopathy, chronic     Lumbar radiculopathy, chronic [facility]. Next OV 07/06/18 Reports sig uncontrolled pain - 7/10 constant described as stabbing. Short rx for Meloxicam 7.5mg  QD provided  Urinary frequency UA Blood + Nitrates + Leu Moderate Specimen sent for C/S Started on Macrobid 100mg  BID x 7 d    FOLLOW-UP:  Return if symptoms worsen or fail to improve.

## 2018-06-29 ENCOUNTER — Encounter: Payer: Self-pay | Admitting: Adult Health

## 2018-06-29 ENCOUNTER — Ambulatory Visit (INDEPENDENT_AMBULATORY_CARE_PROVIDER_SITE_OTHER): Payer: Medicare HMO | Admitting: Adult Health

## 2018-06-29 VITALS — BP 135/85 | HR 98 | Ht 63.0 in | Wt 263.7 lb

## 2018-06-29 DIAGNOSIS — I82409 Acute embolism and thrombosis of unspecified deep veins of unspecified lower extremity: Secondary | ICD-10-CM | POA: Insufficient documentation

## 2018-06-29 DIAGNOSIS — R350 Frequency of micturition: Secondary | ICD-10-CM | POA: Diagnosis not present

## 2018-06-29 DIAGNOSIS — J449 Chronic obstructive pulmonary disease, unspecified: Secondary | ICD-10-CM | POA: Diagnosis not present

## 2018-06-29 DIAGNOSIS — M5441 Lumbago with sciatica, right side: Secondary | ICD-10-CM | POA: Diagnosis not present

## 2018-06-29 DIAGNOSIS — R109 Unspecified abdominal pain: Secondary | ICD-10-CM | POA: Diagnosis not present

## 2018-06-29 DIAGNOSIS — R8290 Unspecified abnormal findings in urine: Secondary | ICD-10-CM

## 2018-06-29 DIAGNOSIS — R3915 Urgency of urination: Secondary | ICD-10-CM | POA: Diagnosis not present

## 2018-06-29 DIAGNOSIS — R102 Pelvic and perineal pain: Secondary | ICD-10-CM

## 2018-06-29 DIAGNOSIS — R69 Illness, unspecified: Secondary | ICD-10-CM | POA: Diagnosis not present

## 2018-06-29 DIAGNOSIS — I10 Essential (primary) hypertension: Secondary | ICD-10-CM | POA: Diagnosis not present

## 2018-06-29 DIAGNOSIS — M5416 Radiculopathy, lumbar region: Secondary | ICD-10-CM | POA: Diagnosis not present

## 2018-06-29 DIAGNOSIS — G8929 Other chronic pain: Secondary | ICD-10-CM | POA: Diagnosis not present

## 2018-06-29 DIAGNOSIS — M961 Postlaminectomy syndrome, not elsewhere classified: Secondary | ICD-10-CM | POA: Diagnosis not present

## 2018-06-29 DIAGNOSIS — I2782 Chronic pulmonary embolism: Secondary | ICD-10-CM | POA: Diagnosis not present

## 2018-06-29 DIAGNOSIS — M1712 Unilateral primary osteoarthritis, left knee: Secondary | ICD-10-CM | POA: Diagnosis not present

## 2018-06-29 LAB — POCT URINALYSIS DIPSTICK
GLUCOSE UA: NEGATIVE
Nitrite, UA: POSITIVE
Protein, UA: POSITIVE — AB
SPEC GRAV UA: 1.025 (ref 1.010–1.025)
Urobilinogen, UA: 0.2 E.U./dL
pH, UA: 7 (ref 5.0–8.0)

## 2018-06-29 MED ORDER — NITROFURANTOIN MONOHYD MACRO 100 MG PO CAPS: 100.0000 mg | ORAL_CAPSULE | Freq: Two times a day (BID) | ORAL | 0 refills | Status: DC

## 2018-06-29 MED ORDER — MELOXICAM 7.5 MG PO TABS: 7.5000 mg | ORAL_TABLET | Freq: Every day | ORAL | 0 refills | Status: DC

## 2018-06-29 NOTE — Assessment & Plan Note (Signed)
UA Blood + Nitrates + Leu Moderate Specimen sent for C/S Started on Macrobid 100mg  BID x 7 d

## 2018-06-29 NOTE — Assessment & Plan Note (Signed)
[facility]. Next OV 07/06/18 Reports sig uncontrolled pain - 7/10 constant described as stabbing. Short rx for Meloxicam 7.5mg  QD provided

## 2018-06-29 NOTE — Patient Instructions (Addendum)
Urinary Tract Infection, Adult A urinary tract infection (UTI) is an infection of any part of the urinary tract. The urinary tract includes the:  Kidneys.  Ureters.  Bladder.  Urethra.  These organs make, store, and get rid of pee (urine) in the body. Follow these instructions at home:  Take over-the-counter and prescription medicines only as told by your doctor.  If you were prescribed an antibiotic medicine, take it as told by your doctor. Do not stop taking the antibiotic even if you start to feel better.  Avoid the following drinks: ? Alcohol. ? Caffeine. ? Tea. ? Carbonated drinks.  Drink enough fluid to keep your pee clear or pale yellow.  Keep all follow-up visits as told by your doctor. This is important.  Make sure to: ? Empty your bladder often and completely. Do not to hold pee for long periods of time. ? Empty your bladder before and after sex. ? Wipe from front to back after a bowel movement if you are female. Use each tissue one time when you wipe. Contact a doctor if:  You have back pain.  You have a fever.  You feel sick to your stomach (nauseous).  You throw up (vomit).  Your symptoms do not get better after 3 days.  Your symptoms go away and then come back. Get help right away if:  You have very bad back pain.  You have very bad lower belly (abdominal) pain.  You are throwing up and cannot keep down any medicines or water. This information is not intended to replace advice given to you by your health care provider. Make sure you discuss any questions you have with your health care provider. Document Released: 03/28/2008 Document Revised: 03/17/2016 Document Reviewed: 08/31/2015 Elsevier Interactive Patient Education  2018 [employer].  Please take Macrobid 100mg  twice daily, take for 7 days. Increase fluids. Please use Meloxicam 7.5mg  once daily for pain. Continue to walk/move as much as tolerated. Continue with back massage. Please  keep your [facility] appt next week. We will call you when your urine culture results are ready. FEEL BETTER!

## 2018-07-02 ENCOUNTER — Other Ambulatory Visit: Payer: Self-pay | Admitting: Adult Health

## 2018-07-02 DIAGNOSIS — R52 Pain, unspecified: Secondary | ICD-10-CM | POA: Diagnosis not present

## 2018-07-02 DIAGNOSIS — I10 Essential (primary) hypertension: Secondary | ICD-10-CM | POA: Diagnosis not present

## 2018-07-02 DIAGNOSIS — M5489 Other dorsalgia: Secondary | ICD-10-CM | POA: Diagnosis not present

## 2018-07-02 LAB — CULTURE, URINE COMPREHENSIVE

## 2018-07-02 MED ORDER — CEFUROXIME AXETIL 250 MG PO TABS: 250.0000 mg | ORAL_TABLET | Freq: Two times a day (BID) | ORAL | 0 refills | Status: DC

## 2018-07-02 NOTE — Progress Notes (Signed)
Macrobid d/c'd Started on Cefuroxime (Ceftine) 250mg  BIDx 7 d

## 2018-07-03 ENCOUNTER — Emergency Department (HOSPITAL_COMMUNITY): Payer: Medicare HMO

## 2018-07-03 ENCOUNTER — Encounter (HOSPITAL_COMMUNITY): Payer: Self-pay | Admitting: Emergency Medicine

## 2018-07-03 ENCOUNTER — Other Ambulatory Visit: Payer: Self-pay

## 2018-07-03 ENCOUNTER — Emergency Department (HOSPITAL_COMMUNITY)
Admission: EM | Admit: 2018-07-03 | Discharge: 2018-07-03 | Disposition: A | Discharge status: Home / Self Care | Payer: Medicare HMO | Attending: Emergency Medicine | Admitting: Emergency Medicine

## 2018-07-03 DIAGNOSIS — R109 Unspecified abdominal pain: Secondary | ICD-10-CM | POA: Diagnosis not present

## 2018-07-03 DIAGNOSIS — I10 Essential (primary) hypertension: Secondary | ICD-10-CM | POA: Diagnosis not present

## 2018-07-03 DIAGNOSIS — J449 Chronic obstructive pulmonary disease, unspecified: Secondary | ICD-10-CM | POA: Insufficient documentation

## 2018-07-03 DIAGNOSIS — Z96651 Presence of right artificial knee joint: Secondary | ICD-10-CM | POA: Insufficient documentation

## 2018-07-03 DIAGNOSIS — Z79899 Other long term (current) drug therapy: Secondary | ICD-10-CM | POA: Insufficient documentation

## 2018-07-03 DIAGNOSIS — Z7982 Long term (current) use of aspirin: Secondary | ICD-10-CM | POA: Insufficient documentation

## 2018-07-03 DIAGNOSIS — Z91040 Latex allergy status: Secondary | ICD-10-CM | POA: Diagnosis not present

## 2018-07-03 DIAGNOSIS — M546 Pain in thoracic spine: Secondary | ICD-10-CM | POA: Diagnosis not present

## 2018-07-03 DIAGNOSIS — J984 Other disorders of lung: Secondary | ICD-10-CM | POA: Diagnosis not present

## 2018-07-03 LAB — COMPREHENSIVE METABOLIC PANEL
ALBUMIN: 3.3 g/dL — AB (ref 3.5–5.0)
ALK PHOS: 87 U/L (ref 38–126)
ALT: 14 U/L (ref 0–44)
AST: 22 U/L (ref 15–41)
Anion gap: 15 (ref 5–15)
BILIRUBIN TOTAL: 0.6 mg/dL (ref 0.3–1.2)
BUN: <5 mg/dL — ABNORMAL LOW (ref 8–23)
CALCIUM: 8.9 mg/dL (ref 8.9–10.3)
CO2: 22 mmol/L (ref 22–32)
Chloride: 103 mmol/L (ref 98–111)
Creatinine, Ser: 0.73 mg/dL (ref 0.44–1.00)
GFR calc non Af Amer: >60 mL/min (ref >60)
GLUCOSE: 100 mg/dL — AB (ref 70–99)
Potassium: 3.6 mmol/L (ref 3.5–5.1)
Sodium: 140 mmol/L (ref 135–145)
TOTAL PROTEIN: 6.4 g/dL — AB (ref 6.5–8.1)

## 2018-07-03 LAB — CBC WITH DIFFERENTIAL/PLATELET
Abs Immature Granulocytes: 0 10*3/uL (ref 0.0–0.1)
Basophils Absolute: 0.1 10*3/uL (ref 0.0–0.1)
Basophils Relative: 0 %
Eosinophils Absolute: 0.1 10*3/uL (ref 0.0–0.7)
Eosinophils Relative: 1 %
HEMATOCRIT: 37.7 % (ref 36.0–46.0)
HEMOGLOBIN: 11 g/dL — AB (ref 12.0–15.0)
Immature Granulocytes: 0 %
LYMPHS ABS: 3.1 10*3/uL (ref 0.7–4.0)
LYMPHS PCT: 27 %
MCH: 21.3 pg — AB (ref 26.0–34.0)
MCHC: 29.2 g/dL — ABNORMAL LOW (ref 30.0–36.0)
MCV: 73.1 fL — ABNORMAL LOW (ref 78.0–100.0)
MONO ABS: 0.9 10*3/uL (ref 0.1–1.0)
MONOS PCT: 8 %
Neutro Abs: 7.2 10*3/uL (ref 1.7–7.7)
Neutrophils Relative %: 64 %
Platelets: 347 10*3/uL (ref 150–400)
RBC: 5.16 MIL/uL — ABNORMAL HIGH (ref 3.87–5.11)
RDW: 18.6 % — ABNORMAL HIGH (ref 11.5–15.5)
WBC: 11.4 10*3/uL — ABNORMAL HIGH (ref 4.0–10.5)

## 2018-07-03 LAB — URINALYSIS, ROUTINE W REFLEX MICROSCOPIC
BILIRUBIN URINE: NEGATIVE
GLUCOSE, UA: NEGATIVE mg/dL
HGB URINE DIPSTICK: NEGATIVE
KETONES UR: NEGATIVE mg/dL
LEUKOCYTES UA: NEGATIVE
Nitrite: NEGATIVE
PROTEIN: NEGATIVE mg/dL
Specific Gravity, Urine: 1.014 (ref 1.005–1.030)
pH: 9 — ABNORMAL HIGH (ref 5.0–8.0)

## 2018-07-03 MED ORDER — HYDROMORPHONE HCL 1 MG/ML IJ SOLN
2.0000 mg | Freq: Once | INTRAMUSCULAR | Status: AC
  Administered 2018-07-03: 2 mg via INTRAMUSCULAR
  Filled 2018-07-03: qty 2

## 2018-07-03 MED ORDER — KETOROLAC TROMETHAMINE 60 MG/2ML IM SOLN
60.0000 mg | Freq: Once | INTRAMUSCULAR | Status: AC
  Administered 2018-07-03: 60 mg via INTRAMUSCULAR
  Filled 2018-07-03: qty 2

## 2018-07-03 MED ORDER — METHOCARBAMOL 500 MG PO TABS
750.0000 mg | ORAL_TABLET | Freq: Once | ORAL | Status: AC
  Administered 2018-07-03: 750 mg via ORAL
  Filled 2018-07-03: qty 2

## 2018-07-03 MED ORDER — OXYCODONE HCL 10 MG PO TABS: 10.0000 mg | ORAL_TABLET | Freq: Four times a day (QID) | ORAL | 0 refills | Status: DC | PRN

## 2018-07-03 MED ORDER — OXYCODONE-ACETAMINOPHEN 5-325 MG PO TABS
2.0000 | ORAL_TABLET | Freq: Once | ORAL | Status: AC
  Administered 2018-07-03: 2 via ORAL
  Filled 2018-07-03: qty 2

## 2018-07-03 NOTE — ED Triage Notes (Signed)
Pt BIB [facility], c/o increased chronic back pain and recent UTI diagnosis. Pt ambulatory, states she took an oxycodone and robaxin earlier in the day without relief.

## 2018-07-03 NOTE — ED Notes (Signed)
Pt SpO2 observed to decr to 80% on RA as pt slept. Pt placed on 2Lpm via [place].

## 2018-07-03 NOTE — ED Notes (Signed)
Pt departed in NAD.  

## 2018-07-03 NOTE — ED Notes (Signed)
Patient transported to X-ray 

## 2018-07-03 NOTE — ED Notes (Signed)
ED Provider at bedside. 

## 2018-07-03 NOTE — ED Provider Notes (Signed)
[facility] Provider Note   CSN: [id number] Arrival date & time: 07/03/18  0004     History   Chief Complaint Chief Complaint  Patient presents with  . Back Pain    HPI [name] is a 62 y.o. female.  Patient presents with severe, sharp upper left back pain that started over the last several days without known injury. She has had similar pain in the past some time ago. She reports chronic low back pain but the current pain is not her chronic symptoms. No SOB, cough or fever. She had some oxycodone at home from a previous prescription but states this is not helping her pain. She is also having lower abdominal pain c/w symptoms diagnosed as a UTI by her doctor 3 days ago. She picked up her antibiotic this morning and has taken one dose. No nausea/vomiting, no chest pain.  The history is provided by the patient, the spouse and a relative. No language interpreter was used.  Back Pain   Associated symptoms include abdominal pain and dysuria. Pertinent negatives include no chest pain and no fever.    Past Medical History:  Diagnosis Date  . Acid reflux   . Asthma   . Chronic pain   . COPD (chronic obstructive pulmonary disease) ([place])   . Diverticulitis   . Heart murmur   . Hiatal hernia   . High cholesterol   . History of UTI   . Hypertension   . Labral tear of shoulder    left  . Myocardial infarction ([place]) 2014  . Pneumonia 2014  . Pulmonary embolism ([place]) last 2014   x 3   . Ruptured disc, cervical    1 in neck, 1 in midback and 1 in low back, 6 bulging discs,  upper and lower back, 5 slipped discs all the way uop and down back and   . Sleep apnea    uses cpap, pt does not know settings    Patient Active Problem List   Diagnosis Date Noted  . Deep venous thrombosis ([place]) 06/29/2018  . Urinary frequency 06/29/2018  . Acute bronchitis with COPD ([place]) 04/11/2018  . Chronic pain due to trauma 04/10/2018  . Osteoarthritis  of left knee 04/10/2018  . 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 Lumbar post-laminectomy syndrome 04/10/2018  . Chest tightness 11/28/2017  . Anxiety 11/02/2017  . Healthcare maintenance 10/31/2017  . Chronic bilateral back pain 10/31/2017  . Pulmonary embolism ([place]) 10/31/2017  . COPD (chronic obstructive pulmonary disease) ([place]) 10/31/2017  . Sleep apnea 10/31/2017  . History of Coumadin therapy 10/31/2017  . Lumbar radiculopathy, chronic   . Essential hypertension   . Lumbar back pain with radiculopathy affecting right lower extremity 06/05/2017  . Palliative care by specialist   . Right leg pain 06/02/2017  . Severe low back pain 06/02/2017  . Acute right-sided low back pain with sciatica   . Recurrent UTI 05/10/2016  . Urinary incontinence 12/30/2015  . Chronic pulmonary embolism ([place]) 04/17/2013  . UTI (urinary tract infection) 04/17/2013  . Diverticulitis of sigmoid colon 04/16/2013  . Obesity, Class III, BMI 40-49.9 (morbid obesity) ([place]) 04/16/2013  . Chronic narcotic dependence ([place]) 04/16/2013  . Asthma 11/16/2011  . GERD (gastroesophageal reflux disease) 11/16/2011  . Hyperlipidemia 11/16/2011    Past Surgical History:  Procedure Laterality Date  .  bone spurs removed from feet Bilateral   . ABDOMINAL HYSTERECTOMY    . BACK SURGERY  fusion x 2 lower back, laser surgery mid and back and neck also done  . BREAST BIOPSY Right   . CARPAL TUNNEL RELEASE Right   . CHOLECYSTECTOMY    . colon polyps    . COLONOSCOPY WITH PROPOFOL N/A 04/08/2016   Procedure: COLONOSCOPY WITH PROPOFOL;  Surgeon: Carol Ada, MD;  Location: [facility];  Service: Endoscopy;  Laterality: N/A;  . colonscopy     x 2, with polyps removed both times  . IR FL GUIDED LOC OF NEEDLE/CATH TIP FOR SPINAL INJECTION RT  06/04/2017  . JOINT REPLACEMENT    . REPLACEMENT TOTAL KNEE Right   . TUBAL LIGATION       OB History   None      Home Medications    Prior to  Admission medications   Medication Sig Start Date End Date Taking? Authorizing Provider  albuterol (PROVENTIL HFA;VENTOLIN HFA) 108 (90 Base) MCG/ACT inhaler Inhale 2 puffs into the lungs every 6 (six) hours as needed. For wheezing 04/11/18   Danford, Valetta Fuller D, NP  albuterol (PROVENTIL) (2.5 MG/3ML) 0.083% nebulizer solution Take 3 mLs (2.5 mg total) by nebulization every 6 (six) hours as needed for wheezing or shortness of breath. 06/05/18   Danford, Valetta Fuller D, NP  amoxicillin-clavulanate (AUGMENTIN) 875-125 MG tablet Take 1 tablet by mouth 2 (two) times daily. 06/05/18   Mina [name] D, NP  aspirin EC 81 MG tablet Take 81 mg by mouth daily at 12 noon.     [provider]  cefUROXime (CEFTIN) 250 MG tablet Take 1 tablet (250 mg total) by mouth 2 (two) times daily with a meal. 07/02/18   Danford, Valetta Fuller D, NP  diclofenac sodium (VOLTAREN) 1 % GEL Apply 2 g topically 4 (four) times daily as needed (For pain in shoulder, knees, back and neck.).     [provider]  furosemide (LASIX) 40 MG tablet TAKE 1 TABLET (40 MG TOTAL) BY MOUTH 2 (TWO) TIMES DAILY AS NEEDED FOR FLUID. 03/27/18   Danford, Valetta Fuller D, NP  Hypromellose (ARTIFICIAL TEARS OP) Place 1 drop into both eyes daily as needed (dry eyes).    [provider]  lactulose (CHRONULAC) 10 GM/15ML solution Take 15 mLs (10 g total) by mouth 2 (two) times daily. 10/18/17   Lawyer, Harrell Gave, PA-C  lidocaine (LIDODERM) 5 % Place 1-3 patches onto the skin daily. Remove & Discard patch within 12 hours or as directed by MD    [provider]  losartan (COZAAR) 50 MG tablet TAKE 1 TABLET BY MOUTH DAILY AS NEEDED FOR BLOOD PRESSURE 03/27/18   Danford, Valetta Fuller D, NP  meloxicam (MOBIC) 7.5 MG tablet Take 1 tablet (7.5 mg total) by mouth daily. 06/29/18   Danford, Valetta Fuller D, NP  methocarbamol (ROBAXIN) 500 MG tablet Take 1 tablet (500 mg total) by mouth every 6 (six) hours as needed for muscle spasms. 06/15/18   Kirsteins, Luanna Salk, MD  metoprolol  succinate (TOPROL-XL) 50 MG 24 hr tablet TAKE 1 TABLET (50 MG TOTAL) BY MOUTH DAILY. TAKE WITH OR IMMEDIATELY FOLLOWING A MEAL. 03/27/18   Danford, Valetta Fuller D, NP  nitroGLYCERIN (NITROSTAT) 0.4 MG SL tablet Place 1 tablet (0.4 mg total) under the tongue every 5 (five) minutes as needed for chest pain. 10/31/17   Danford, Valetta Fuller D, NP  nystatin [facility]) powder Apply 1 g topically daily as needed (rash).    [provider]  omeprazole (PRILOSEC) 40 MG capsule TAKE 1 CAPSULE BY MOUTH TWICE A DAY 03/27/18  Mina [name] D, NP  ondansetron (ZOFRAN ODT) 8 MG disintegrating tablet 8mg  ODT q4 hours prn nausea 06/05/18   Danford, Valetta Fuller D, NP  Oxycodone HCl 10 MG TABS Take 1 tablet (10 mg total) by mouth every 6 (six) hours as needed (for severe pain). 10/11/17   Maczis, Barth Kirks, PA-C  oxyCODONE-acetaminophen (PERCOCET) 10-325 MG tablet Take 1 tablet by mouth every 6 (six) hours as needed for pain. 02/02/18   Veryl Speak, MD  pravastatin (PRAVACHOL) 40 MG tablet TAKE 1 TABLET BY MOUTH DAILY AT 12 NOON. 03/08/18   Danford, Valetta Fuller D, NP  pregabalin (LYRICA) 25 MG capsule Take 1 capsule (25 mg total) by mouth 2 (two) times daily. 06/15/18   Kirsteins, Luanna Salk, MD  SUMAtriptan (IMITREX) 5 MG/ACT nasal spray Place 1 spray (5 mg total) into the nose every 2 (two) hours as needed for migraine. Max daily dose 2 sprays/day 06/05/18   Danford, Valetta Fuller D, NP  trimethoprim (TRIMPEX) 100 MG tablet Take 100 mg by mouth daily.    [provider]  Vitamin D, Ergocalciferol, (DRISDOL) 50000 units CAPS capsule Take 50,000 Units by mouth every 7 (seven) days.    [provider]  warfarin (COUMADIN) 5 MG tablet TAKE 1 TABLET ON MONDAY,WEDNESDAY,FRIDAY AND A 1/2 TABLET THE REST OF THE WEEK 04/30/18   Danford, Berna Spare, NP    Family History Family History  Problem Relation Age of Onset  . Heart failure Mother   . COPD Mother   . Heart failure Father   . COPD Father   . Cancer Other   . Heart failure Other   . Breast  cancer Maternal Aunt   . Breast cancer Paternal Aunt   . Breast cancer Cousin     Social History Social History   Tobacco Use  . Smoking status: Never Smoker  . Smokeless tobacco: Never Used  Substance Use Topics  . Alcohol use: No  . Drug use: No     Allergies   Latex; Other; Shellfish allergy; and Tape   Review of Systems Review of Systems  Constitutional: Negative for chills and fever.  Respiratory: Negative.  Negative for cough and shortness of breath.   Cardiovascular: Negative.  Negative for chest pain.  Gastrointestinal: Positive for abdominal pain. Negative for nausea and vomiting.  Genitourinary: Positive for dysuria.  Musculoskeletal: Positive for back pain.  Skin: Negative.  Negative for color change and rash.  Neurological: Negative.      Physical Exam Updated Vital Signs BP 137/82   Pulse 88   Temp 98.6 F (37 C) (Oral)   Resp 20   SpO2 96%   Physical Exam  Constitutional: She is oriented to person, place, and time. She appears well-developed and well-nourished. No distress.  Patient uncomfortable appearing  HENT:  Head: Normocephalic.  Neck: Normal range of motion. Neck supple.  Cardiovascular: Normal rate and regular rhythm.  Pulmonary/Chest: Effort normal and breath sounds normal. She has no wheezes. She has no rales. She exhibits no tenderness.  Abdominal: Soft. Bowel sounds are normal. There is tenderness (Suprapubic, mild). There is no rebound and no guarding.  Obese abdomen  Musculoskeletal: Normal range of motion.       Back:  Neurological: She is alert and oriented to person, place, and time. No sensory deficit. She exhibits normal muscle tone.  Skin: Skin is warm and dry. No rash noted.  Psychiatric: She has a normal mood and affect.     ED Treatments / Results  Labs (  all labs ordered are listed, but only abnormal results are displayed) Labs Reviewed - No data to display  EKG None  Radiology No results  found.  Procedures Procedures (including critical care time)  Medications Ordered in ED Medications  HYDROmorphone (DILAUDID) injection 2 mg (has no administration in time range)     Initial Impression / Assessment and Plan / ED Course  I have reviewed the triage vital signs and the nursing notes.  Pertinent labs & imaging results that were available during my care of the patient were reviewed by me and considered in my medical decision making (see chart for details).     Patient here for severe, sharp upper left back pain, similar to previous pain, not the same as her chronic low back pain. Symptoms started 2-3 days ago without injury. No pulmonary symptoms, fever, vomiting. She reports lower abdominal pain with recent dx UTI, on abx with first dose today.   IM Dilaudid given with some relief of pain. She drops her oxygen saturation to 80%, 99% with 2L O2 via [place]. Will monitor. She reports ongoing pain but appears more comfortable.   IM Toradol and PO Robaxin ordered. Labs, CXR imaging are unremarkable, including UA which is improved from recent analysis. VSS.   Anticipate discharge home with close PCP follow up for recurrent back pain. Patient has continuous pain. Will transition to PO oxycodone and discharge. Patient is comfortable with care plan.  Final Clinical Impressions(s) / ED Diagnoses   Final diagnoses:  None   1. Upper back pain, recurrent   ED Discharge Orders    None       Dennie Bible 23/76/28 [id number]    Delora Fuel, MD 76/16/07 [phone]

## 2018-07-03 NOTE — Discharge Instructions (Addendum)
Take medications as directed and keep your scheduled appointments for Pain management and with Dr. Ellene Route for further management.

## 2018-07-06 ENCOUNTER — Ambulatory Visit: Payer: Medicare HMO | Admitting: Physical Medicine & Rehabilitation

## 2018-07-08 ENCOUNTER — Inpatient Hospital Stay (HOSPITAL_COMMUNITY)
Admission: EM | Admit: 2018-07-08 | Discharge: 2018-07-10 | DRG: 392 | Disposition: A | Discharge status: Home Health | Payer: Medicare HMO | Attending: Family Medicine | Admitting: Family Medicine

## 2018-07-08 ENCOUNTER — Encounter (HOSPITAL_COMMUNITY): Payer: Self-pay

## 2018-07-08 ENCOUNTER — Emergency Department (HOSPITAL_COMMUNITY): Payer: Medicare HMO

## 2018-07-08 ENCOUNTER — Other Ambulatory Visit: Payer: Self-pay

## 2018-07-08 DIAGNOSIS — M5416 Radiculopathy, lumbar region: Secondary | ICD-10-CM | POA: Diagnosis present

## 2018-07-08 DIAGNOSIS — Z86718 Personal history of other venous thrombosis and embolism: Secondary | ICD-10-CM

## 2018-07-08 DIAGNOSIS — M5441 Lumbago with sciatica, right side: Secondary | ICD-10-CM | POA: Diagnosis not present

## 2018-07-08 DIAGNOSIS — K5732 Diverticulitis of large intestine without perforation or abscess without bleeding: Secondary | ICD-10-CM | POA: Diagnosis present

## 2018-07-08 DIAGNOSIS — F1120 Opioid dependence, uncomplicated: Secondary | ICD-10-CM | POA: Diagnosis present

## 2018-07-08 DIAGNOSIS — D649 Anemia, unspecified: Secondary | ICD-10-CM | POA: Diagnosis present

## 2018-07-08 DIAGNOSIS — I252 Old myocardial infarction: Secondary | ICD-10-CM | POA: Diagnosis not present

## 2018-07-08 DIAGNOSIS — Z23 Encounter for immunization: Secondary | ICD-10-CM | POA: Diagnosis not present

## 2018-07-08 DIAGNOSIS — G4733 Obstructive sleep apnea (adult) (pediatric): Secondary | ICD-10-CM | POA: Diagnosis present

## 2018-07-08 DIAGNOSIS — Z6841 Body Mass Index (BMI) 40.0 and over, adult: Secondary | ICD-10-CM

## 2018-07-08 DIAGNOSIS — Z66 Do not resuscitate: Secondary | ICD-10-CM | POA: Diagnosis present

## 2018-07-08 DIAGNOSIS — I10 Essential (primary) hypertension: Secondary | ICD-10-CM | POA: Diagnosis present

## 2018-07-08 DIAGNOSIS — Z91040 Latex allergy status: Secondary | ICD-10-CM

## 2018-07-08 DIAGNOSIS — J449 Chronic obstructive pulmonary disease, unspecified: Secondary | ICD-10-CM | POA: Diagnosis present

## 2018-07-08 DIAGNOSIS — Z9114 Patient's other noncompliance with medication regimen: Secondary | ICD-10-CM

## 2018-07-08 DIAGNOSIS — R011 Cardiac murmur, unspecified: Secondary | ICD-10-CM | POA: Diagnosis present

## 2018-07-08 DIAGNOSIS — K449 Diaphragmatic hernia without obstruction or gangrene: Secondary | ICD-10-CM | POA: Diagnosis present

## 2018-07-08 DIAGNOSIS — G8929 Other chronic pain: Secondary | ICD-10-CM | POA: Diagnosis present

## 2018-07-08 DIAGNOSIS — Z87440 Personal history of urinary (tract) infections: Secondary | ICD-10-CM | POA: Diagnosis not present

## 2018-07-08 DIAGNOSIS — E876 Hypokalemia: Secondary | ICD-10-CM | POA: Diagnosis present

## 2018-07-08 DIAGNOSIS — R52 Pain, unspecified: Secondary | ICD-10-CM | POA: Diagnosis not present

## 2018-07-08 DIAGNOSIS — R1030 Lower abdominal pain, unspecified: Secondary | ICD-10-CM

## 2018-07-08 DIAGNOSIS — Z825 Family history of asthma and other chronic lower respiratory diseases: Secondary | ICD-10-CM | POA: Diagnosis not present

## 2018-07-08 DIAGNOSIS — Z888 Allergy status to other drugs, medicaments and biological substances status: Secondary | ICD-10-CM

## 2018-07-08 DIAGNOSIS — Z9049 Acquired absence of other specified parts of digestive tract: Secondary | ICD-10-CM

## 2018-07-08 DIAGNOSIS — R5383 Other fatigue: Secondary | ICD-10-CM | POA: Diagnosis not present

## 2018-07-08 DIAGNOSIS — Z91048 Other nonmedicinal substance allergy status: Secondary | ICD-10-CM

## 2018-07-08 DIAGNOSIS — Z7901 Long term (current) use of anticoagulants: Secondary | ICD-10-CM

## 2018-07-08 DIAGNOSIS — Z981 Arthrodesis status: Secondary | ICD-10-CM

## 2018-07-08 DIAGNOSIS — Z79899 Other long term (current) drug therapy: Secondary | ICD-10-CM

## 2018-07-08 DIAGNOSIS — Z86711 Personal history of pulmonary embolism: Secondary | ICD-10-CM | POA: Diagnosis not present

## 2018-07-08 DIAGNOSIS — E785 Hyperlipidemia, unspecified: Secondary | ICD-10-CM | POA: Diagnosis present

## 2018-07-08 DIAGNOSIS — K219 Gastro-esophageal reflux disease without esophagitis: Secondary | ICD-10-CM | POA: Diagnosis present

## 2018-07-08 DIAGNOSIS — Z7982 Long term (current) use of aspirin: Secondary | ICD-10-CM

## 2018-07-08 DIAGNOSIS — M545 Low back pain, unspecified: Secondary | ICD-10-CM

## 2018-07-08 DIAGNOSIS — R69 Illness, unspecified: Secondary | ICD-10-CM | POA: Diagnosis not present

## 2018-07-08 DIAGNOSIS — F419 Anxiety disorder, unspecified: Secondary | ICD-10-CM | POA: Diagnosis present

## 2018-07-08 DIAGNOSIS — Z96651 Presence of right artificial knee joint: Secondary | ICD-10-CM | POA: Diagnosis present

## 2018-07-08 DIAGNOSIS — Z8249 Family history of ischemic heart disease and other diseases of the circulatory system: Secondary | ICD-10-CM

## 2018-07-08 DIAGNOSIS — K5792 Diverticulitis of intestine, part unspecified, without perforation or abscess without bleeding: Secondary | ICD-10-CM | POA: Diagnosis present

## 2018-07-08 DIAGNOSIS — K6389 Other specified diseases of intestine: Secondary | ICD-10-CM | POA: Diagnosis not present

## 2018-07-08 DIAGNOSIS — Z90710 Acquired absence of both cervix and uterus: Secondary | ICD-10-CM

## 2018-07-08 DIAGNOSIS — J9809 Other diseases of bronchus, not elsewhere classified: Secondary | ICD-10-CM | POA: Diagnosis not present

## 2018-07-08 DIAGNOSIS — Z91013 Allergy to seafood: Secondary | ICD-10-CM

## 2018-07-08 LAB — CBC WITH DIFFERENTIAL/PLATELET
Abs Immature Granulocytes: 0.1 10*3/uL (ref 0.0–0.1)
Basophils Absolute: 0.1 10*3/uL (ref 0.0–0.1)
Basophils Relative: 0 %
EOS ABS: 0.1 10*3/uL (ref 0.0–0.7)
EOS PCT: 1 %
HEMATOCRIT: 39.9 % (ref 36.0–46.0)
Hemoglobin: 11.3 g/dL — ABNORMAL LOW (ref 12.0–15.0)
Immature Granulocytes: 1 %
LYMPHS ABS: 1.7 10*3/uL (ref 0.7–4.0)
LYMPHS PCT: 10 %
MCH: 21 pg — AB (ref 26.0–34.0)
MCHC: 28.3 g/dL — AB (ref 30.0–36.0)
MCV: 74.3 fL — AB (ref 78.0–100.0)
MONO ABS: 1.4 10*3/uL — AB (ref 0.1–1.0)
Monocytes Relative: 8 %
Neutro Abs: 13.6 10*3/uL — ABNORMAL HIGH (ref 1.7–7.7)
Neutrophils Relative %: 80 %
Platelets: 406 10*3/uL — ABNORMAL HIGH (ref 150–400)
RBC: 5.37 MIL/uL — ABNORMAL HIGH (ref 3.87–5.11)
RDW: 19.9 % — ABNORMAL HIGH (ref 11.5–15.5)
WBC: 16.9 10*3/uL — ABNORMAL HIGH (ref 4.0–10.5)

## 2018-07-08 LAB — I-STAT CG4 LACTIC ACID, ED
LACTIC ACID, VENOUS: 1.89 mmol/L (ref 0.5–1.9)
Lactic Acid, Venous: 1.76 mmol/L (ref 0.5–1.9)

## 2018-07-08 LAB — COMPREHENSIVE METABOLIC PANEL
ALT: 12 U/L (ref 0–44)
ANION GAP: 12 (ref 5–15)
AST: 18 U/L (ref 15–41)
Albumin: 3 g/dL — ABNORMAL LOW (ref 3.5–5.0)
Alkaline Phosphatase: 89 U/L (ref 38–126)
BILIRUBIN TOTAL: 0.9 mg/dL (ref 0.3–1.2)
BUN: 7 mg/dL — ABNORMAL LOW (ref 8–23)
CALCIUM: 8.6 mg/dL — AB (ref 8.9–10.3)
CO2: 24 mmol/L (ref 22–32)
Chloride: 101 mmol/L (ref 98–111)
Creatinine, Ser: 0.71 mg/dL (ref 0.44–1.00)
GLUCOSE: 138 mg/dL — AB (ref 70–99)
Potassium: 3.2 mmol/L — ABNORMAL LOW (ref 3.5–5.1)
Sodium: 137 mmol/L (ref 135–145)
TOTAL PROTEIN: 6.8 g/dL (ref 6.5–8.1)

## 2018-07-08 LAB — URINALYSIS, ROUTINE W REFLEX MICROSCOPIC
BACTERIA UA: NONE SEEN
Bilirubin Urine: NEGATIVE
Glucose, UA: NEGATIVE mg/dL
Hgb urine dipstick: NEGATIVE
Ketones, ur: 5 mg/dL — AB
LEUKOCYTES UA: NEGATIVE
Nitrite: NEGATIVE
PH: 7 (ref 5.0–8.0)
Protein, ur: 30 mg/dL — AB
SPECIFIC GRAVITY, URINE: 1.019 (ref 1.005–1.030)

## 2018-07-08 LAB — LIPASE, BLOOD: Lipase: 20 U/L (ref 11–51)

## 2018-07-08 LAB — PROTIME-INR
INR: 3.54
Prothrombin Time: 35.2 seconds — ABNORMAL HIGH (ref 11.4–15.2)

## 2018-07-08 LAB — I-STAT TROPONIN, ED: TROPONIN I, POC: 0 ng/mL (ref 0.00–0.08)

## 2018-07-08 MED ORDER — OXYCODONE-ACETAMINOPHEN 10-325 MG PO TABS: 1.0000 | ORAL_TABLET | ORAL | Status: DC | PRN

## 2018-07-08 MED ORDER — PIPERACILLIN-TAZOBACTAM 3.375 G IVPB
3.3750 g | Freq: Three times a day (TID) | INTRAVENOUS | Status: DC
  Administered 2018-07-08 – 2018-07-09 (×2): 3.375 g via INTRAVENOUS

## 2018-07-08 MED ORDER — ONDANSETRON HCL 4 MG/2ML IJ SOLN
4.0000 mg | Freq: Once | INTRAMUSCULAR | Status: AC
  Administered 2018-07-08: 4 mg via INTRAVENOUS
  Filled 2018-07-08: qty 2

## 2018-07-08 MED ORDER — ASPIRIN EC 81 MG PO TBEC
81.0000 mg | DELAYED_RELEASE_TABLET | Freq: Every day | ORAL | Status: DC
  Administered 2018-07-09 – 2018-07-10 (×2): 81 mg via ORAL
  Filled 2018-07-08 (×2): qty 1

## 2018-07-08 MED ORDER — OXYCODONE HCL 5 MG PO TABS
5.0000 mg | ORAL_TABLET | ORAL | Status: DC | PRN
  Administered 2018-07-08 – 2018-07-10 (×6): 5 mg via ORAL
  Filled 2018-07-08 (×6): qty 1

## 2018-07-08 MED ORDER — IBUPROFEN 600 MG PO TABS
600.0000 mg | ORAL_TABLET | Freq: Four times a day (QID) | ORAL | Status: DC
  Administered 2018-07-08 – 2018-07-10 (×8): 600 mg via ORAL
  Filled 2018-07-08 (×8): qty 1

## 2018-07-08 MED ORDER — PREGABALIN 25 MG PO CAPS
25.0000 mg | ORAL_CAPSULE | Freq: Two times a day (BID) | ORAL | Status: DC
  Administered 2018-07-08 – 2018-07-10 (×5): 25 mg via ORAL
  Filled 2018-07-08 (×5): qty 1

## 2018-07-08 MED ORDER — LIDOCAINE 5 % EX PTCH
1.0000 | MEDICATED_PATCH | CUTANEOUS | Status: DC
  Administered 2018-07-09: 1 via TRANSDERMAL
  Filled 2018-07-08 (×2): qty 1

## 2018-07-08 MED ORDER — SODIUM CHLORIDE 0.9 % IV BOLUS
1000.0000 mL | Freq: Once | INTRAVENOUS | Status: AC
  Administered 2018-07-08: 1000 mL via INTRAVENOUS

## 2018-07-08 MED ORDER — POTASSIUM CHLORIDE CRYS ER 20 MEQ PO TBCR
40.0000 meq | EXTENDED_RELEASE_TABLET | Freq: Once | ORAL | Status: AC
  Administered 2018-07-08: 40 meq via ORAL
  Filled 2018-07-08: qty 2

## 2018-07-08 MED ORDER — HYDROMORPHONE HCL 1 MG/ML IJ SOLN
1.0000 mg | Freq: Once | INTRAMUSCULAR | Status: AC
  Administered 2018-07-08: 1 mg via INTRAVENOUS
  Filled 2018-07-08: qty 1

## 2018-07-08 MED ORDER — DICLOFENAC SODIUM 1 % TD GEL: 2.0000 g | Freq: Four times a day (QID) | TRANSDERMAL | Status: DC | PRN

## 2018-07-08 MED ORDER — IOHEXOL 300 MG/ML  SOLN
100.0000 mL | Freq: Once | INTRAMUSCULAR | Status: AC | PRN
  Administered 2018-07-08: 100 mL via INTRAVENOUS

## 2018-07-08 MED ORDER — INFLUENZA VAC SPLIT QUAD 0.5 ML IM SUSY
0.5000 mL | PREFILLED_SYRINGE | INTRAMUSCULAR | Status: AC
  Administered 2018-07-09: 0.5 mL via INTRAMUSCULAR
  Filled 2018-07-08: qty 0.5

## 2018-07-08 MED ORDER — OXYCODONE-ACETAMINOPHEN 5-325 MG PO TABS
1.0000 | ORAL_TABLET | ORAL | Status: DC | PRN
  Administered 2018-07-08 – 2018-07-10 (×6): 1 via ORAL
  Filled 2018-07-08 (×6): qty 1

## 2018-07-08 MED ORDER — METHOCARBAMOL 500 MG PO TABS
500.0000 mg | ORAL_TABLET | Freq: Four times a day (QID) | ORAL | Status: DC | PRN
  Administered 2018-07-08 – 2018-07-09 (×3): 500 mg via ORAL
  Filled 2018-07-08 (×3): qty 1

## 2018-07-08 MED ORDER — METOPROLOL SUCCINATE ER 50 MG PO TB24: 50.0000 mg | ORAL_TABLET | Freq: Every day | ORAL | Status: DC

## 2018-07-08 MED ORDER — FENTANYL CITRATE (PF) 100 MCG/2ML IJ SOLN
25.0000 ug | INTRAMUSCULAR | Status: DC | PRN
  Administered 2018-07-08: 25 ug via INTRAVENOUS
  Filled 2018-07-08: qty 2

## 2018-07-08 MED ORDER — LOSARTAN POTASSIUM 25 MG PO TABS
25.0000 mg | ORAL_TABLET | Freq: Every day | ORAL | Status: DC
  Administered 2018-07-09 – 2018-07-10 (×2): 25 mg via ORAL
  Filled 2018-07-08 (×2): qty 1

## 2018-07-08 MED ORDER — PIPERACILLIN-TAZOBACTAM 3.375 G IVPB 30 MIN
3.3750 g | Freq: Once | INTRAVENOUS | Status: AC
  Administered 2018-07-08: 3.375 g via INTRAVENOUS
  Filled 2018-07-08: qty 50

## 2018-07-08 MED ORDER — ONDANSETRON HCL 4 MG/2ML IJ SOLN
4.0000 mg | Freq: Four times a day (QID) | INTRAMUSCULAR | Status: DC | PRN
  Administered 2018-07-08: 4 mg via INTRAVENOUS
  Filled 2018-07-08: qty 2

## 2018-07-08 MED ORDER — ONDANSETRON HCL 4 MG PO TABS: 4.0000 mg | ORAL_TABLET | Freq: Four times a day (QID) | ORAL | Status: DC | PRN

## 2018-07-08 MED ORDER — SODIUM CHLORIDE 0.9 % IV SOLN
INTRAVENOUS | Status: DC
  Administered 2018-07-08 – 2018-07-09 (×2): via INTRAVENOUS

## 2018-07-08 MED ORDER — WARFARIN - PHARMACIST DOSING INPATIENT: Freq: Every day | Status: DC

## 2018-07-08 MED ORDER — SODIUM CHLORIDE 0.9 % IV SOLN
INTRAVENOUS | Status: DC | PRN
  Administered 2018-07-08: 250 mL via INTRAVENOUS

## 2018-07-08 MED ORDER — PRAVASTATIN SODIUM 40 MG PO TABS
40.0000 mg | ORAL_TABLET | Freq: Every day | ORAL | Status: DC
  Administered 2018-07-09 – 2018-07-10 (×2): 40 mg via ORAL
  Filled 2018-07-08 (×2): qty 1

## 2018-07-08 MED ORDER — PANTOPRAZOLE SODIUM 20 MG PO TBEC
20.0000 mg | DELAYED_RELEASE_TABLET | Freq: Every day | ORAL | Status: DC
  Administered 2018-07-08 – 2018-07-10 (×3): 20 mg via ORAL
  Filled 2018-07-08 (×3): qty 1

## 2018-07-08 MED ORDER — METOPROLOL SUCCINATE ER 25 MG PO TB24
25.0000 mg | ORAL_TABLET | Freq: Every day | ORAL | Status: DC
  Administered 2018-07-09 – 2018-07-10 (×2): 25 mg via ORAL
  Filled 2018-07-08 (×2): qty 1

## 2018-07-08 NOTE — ED Provider Notes (Signed)
[facility] EMERGENCY DEPARTMENT Provider Note   CSN: [id number] Arrival date & time: 07/08/18  0856     History   Chief Complaint Chief Complaint  Patient presents with  . Abdominal Pain    w/ c/o back pain    HPI [name] is a 62 y.o. female.  The history is provided by the patient, medical records and a relative. No language interpreter was used.  Abdominal Pain   This is a recurrent problem. The current episode started more than 2 days ago. The problem occurs constantly. The problem has been rapidly worsening. The pain is associated with an unknown factor. The pain is located in the generalized abdominal region. The quality of the pain is dull and sharp. The pain is at a severity of 10/10. The pain is severe. Associated symptoms include fever, nausea and vomiting. Pertinent negatives include diarrhea, constipation, dysuria, frequency and headaches. Nothing aggravates the symptoms. Nothing relieves the symptoms.    Past Medical History:  Diagnosis Date  . Acid reflux   . Asthma   . Chronic pain   . COPD (chronic obstructive pulmonary disease) ([place])   . Diverticulitis   . Heart murmur   . Hiatal hernia   . High cholesterol   . History of UTI   . Hypertension   . Labral tear of shoulder    left  . Myocardial infarction ([place]) 2014  . Pneumonia 2014  . Pulmonary embolism ([place]) last 2014   x 3   . Ruptured disc, cervical    1 in neck, 1 in midback and 1 in low back, 6 bulging discs,  upper and lower back, 5 slipped discs all the way uop and down back and   . Sleep apnea    uses cpap, pt does not know settings    Patient Active Problem List   Diagnosis Date Noted  . Deep venous thrombosis ([place]) 06/29/2018  . Urinary frequency 06/29/2018  . Acute bronchitis with COPD ([place]) 04/11/2018  . Chronic pain due to trauma 04/10/2018  . Osteoarthritis of left knee 04/10/2018  . 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 Lumbar  post-laminectomy syndrome 04/10/2018  . Chest tightness 11/28/2017  . Anxiety 11/02/2017  . Healthcare maintenance 10/31/2017  . Chronic bilateral back pain 10/31/2017  . Pulmonary embolism ([place]) 10/31/2017  . COPD (chronic obstructive pulmonary disease) ([place]) 10/31/2017  . Sleep apnea 10/31/2017  . History of Coumadin therapy 10/31/2017  . Lumbar radiculopathy, chronic   . Essential hypertension   . Lumbar back pain with radiculopathy affecting right lower extremity 06/05/2017  . Palliative care by specialist   . Right leg pain 06/02/2017  . Severe low back pain 06/02/2017  . Acute right-sided low back pain with sciatica   . Recurrent UTI 05/10/2016  . Urinary incontinence 12/30/2015  . Chronic pulmonary embolism ([place]) 04/17/2013  . UTI (urinary tract infection) 04/17/2013  . Diverticulitis of sigmoid colon 04/16/2013  . Obesity, Class III, BMI 40-49.9 (morbid obesity) ([employer]) 04/16/2013  . Chronic narcotic dependence ([place]) 04/16/2013  . Asthma 11/16/2011  . GERD (gastroesophageal reflux disease) 11/16/2011  . Hyperlipidemia 11/16/2011    Past Surgical History:  Procedure Laterality Date  .  bone spurs removed from feet Bilateral   . ABDOMINAL HYSTERECTOMY    . BACK SURGERY     fusion x 2 lower back, laser surgery mid and back and neck also done  . BREAST BIOPSY Right   . CARPAL TUNNEL RELEASE  Right   . CHOLECYSTECTOMY    . colon polyps    . COLONOSCOPY WITH PROPOFOL N/A 04/08/2016   Procedure: COLONOSCOPY WITH PROPOFOL;  Surgeon: Carol Ada, MD;  Location: [facility];  Service: Endoscopy;  Laterality: N/A;  . colonscopy     x 2, with polyps removed both times  . IR FL GUIDED LOC OF NEEDLE/CATH TIP FOR SPINAL INJECTION RT  06/04/2017  . JOINT REPLACEMENT    . REPLACEMENT TOTAL KNEE Right   . TUBAL LIGATION       OB History   None      Home Medications    Prior to Admission medications   Medication Sig Start Date End Date Taking? Authorizing Provider    albuterol (PROVENTIL HFA;VENTOLIN HFA) 108 (90 Base) MCG/ACT inhaler Inhale 2 puffs into the lungs every 6 (six) hours as needed. For wheezing 04/11/18   Danford, Valetta Fuller D, NP  albuterol (PROVENTIL) (2.5 MG/3ML) 0.083% nebulizer solution Take 3 mLs (2.5 mg total) by nebulization every 6 (six) hours as needed for wheezing or shortness of breath. 06/05/18   Danford, Valetta Fuller D, NP  amoxicillin-clavulanate (AUGMENTIN) 875-125 MG tablet Take 1 tablet by mouth 2 (two) times daily. 06/05/18   Mina [name] D, NP  aspirin EC 81 MG tablet Take 81 mg by mouth daily at 12 noon.     [provider]  cefUROXime (CEFTIN) 250 MG tablet Take 1 tablet (250 mg total) by mouth 2 (two) times daily with a meal. 07/02/18   Danford, Valetta Fuller D, NP  diclofenac sodium (VOLTAREN) 1 % GEL Apply 2 g topically 4 (four) times daily as needed (For pain in shoulder, knees, back and neck.).     [provider]  furosemide (LASIX) 40 MG tablet TAKE 1 TABLET (40 MG TOTAL) BY MOUTH 2 (TWO) TIMES DAILY AS NEEDED FOR FLUID. 03/27/18   Danford, Valetta Fuller D, NP  Hypromellose (ARTIFICIAL TEARS OP) Place 1 drop into both eyes daily as needed (dry eyes).    [provider]  lactulose (CHRONULAC) 10 GM/15ML solution Take 15 mLs (10 g total) by mouth 2 (two) times daily. 10/18/17   Lawyer, Harrell Gave, PA-C  lidocaine (LIDODERM) 5 % Place 1-3 patches onto the skin daily. Remove & Discard patch within 12 hours or as directed by MD    [provider]  losartan (COZAAR) 50 MG tablet TAKE 1 TABLET BY MOUTH DAILY AS NEEDED FOR BLOOD PRESSURE 03/27/18   Danford, Valetta Fuller D, NP  meloxicam (MOBIC) 7.5 MG tablet Take 1 tablet (7.5 mg total) by mouth daily. 06/29/18   Danford, Valetta Fuller D, NP  methocarbamol (ROBAXIN) 500 MG tablet Take 1 tablet (500 mg total) by mouth every 6 (six) hours as needed for muscle spasms. 06/15/18   Kirsteins, Luanna Salk, MD  metoprolol succinate (TOPROL-XL) 50 MG 24 hr tablet TAKE 1 TABLET (50 MG TOTAL) BY MOUTH DAILY. TAKE  WITH OR IMMEDIATELY FOLLOWING A MEAL. 03/27/18   Danford, Valetta Fuller D, NP  nitroGLYCERIN (NITROSTAT) 0.4 MG SL tablet Place 1 tablet (0.4 mg total) under the tongue every 5 (five) minutes as needed for chest pain. 10/31/17   Danford, Valetta Fuller D, NP  nystatin [facility]) powder Apply 1 g topically daily as needed (rash).    [provider]  omeprazole (PRILOSEC) 40 MG capsule TAKE 1 CAPSULE BY MOUTH TWICE A DAY 03/27/18   Danford, Katy D, NP  ondansetron (ZOFRAN ODT) 8 MG disintegrating tablet 8mg  ODT q4 hours prn nausea 06/05/18   Danford, Berna Spare,  NP  Oxycodone HCl 10 MG TABS Take 1 tablet (10 mg total) by mouth every 6 (six) hours as needed (for severe pain). 07/03/18   Charlann Lange, PA-C  oxyCODONE-acetaminophen (PERCOCET) 10-325 MG tablet Take 1 tablet by mouth every 6 (six) hours as needed for pain. 02/02/18   Veryl Speak, MD  pravastatin (PRAVACHOL) 40 MG tablet TAKE 1 TABLET BY MOUTH DAILY AT 12 NOON. 03/08/18   Danford, Valetta Fuller D, NP  pregabalin (LYRICA) 25 MG capsule Take 1 capsule (25 mg total) by mouth 2 (two) times daily. 06/15/18   Kirsteins, Luanna Salk, MD  SUMAtriptan (IMITREX) 5 MG/ACT nasal spray Place 1 spray (5 mg total) into the nose every 2 (two) hours as needed for migraine. Max daily dose 2 sprays/day 06/05/18   Danford, Valetta Fuller D, NP  trimethoprim (TRIMPEX) 100 MG tablet Take 100 mg by mouth daily.    [provider]  Vitamin D, Ergocalciferol, (DRISDOL) 50000 units CAPS capsule Take 50,000 Units by mouth every 7 (seven) days.    [provider]  warfarin (COUMADIN) 5 MG tablet TAKE 1 TABLET ON MONDAY,WEDNESDAY,FRIDAY AND A 1/2 TABLET THE REST OF THE WEEK 04/30/18   Danford, Berna Spare, NP    Family History Family History  Problem Relation Age of Onset  . Heart failure Mother   . COPD Mother   . Heart failure Father   . COPD Father   . Cancer Other   . Heart failure Other   . Breast cancer Maternal Aunt   . Breast cancer Paternal Aunt   . Breast cancer Cousin     Social  History Social History   Tobacco Use  . Smoking status: Never Smoker  . Smokeless tobacco: Never Used  Substance Use Topics  . Alcohol use: No  . Drug use: No     Allergies   Latex; Other; Shellfish allergy; and Tape   Review of Systems Review of Systems  Constitutional: Positive for chills, diaphoresis, fatigue and fever.  HENT: Negative for congestion.   Eyes: Negative for visual disturbance.  Respiratory: Negative for cough, chest tightness, shortness of breath and wheezing.   Cardiovascular: Negative for chest pain and palpitations.  Gastrointestinal: Positive for abdominal pain, nausea and vomiting. Negative for abdominal distention, constipation and diarrhea.  Genitourinary: Negative for dysuria, flank pain and frequency.  Musculoskeletal: Negative for back pain and neck pain.  Skin: Negative for rash and wound.  Neurological: Negative for light-headedness and headaches.  All other systems reviewed and are negative.    Physical Exam Updated Vital Signs BP (!) 144/103 (BP Location: Right Wrist)   Pulse 100   Temp (!) 100.5 F (38.1 C) (Oral)   Resp 18   SpO2 97%   Physical Exam  Constitutional: She appears well-developed and well-nourished.  Non-toxic appearance. She does not appear ill. No distress.  HENT:  Head: Normocephalic and atraumatic.  Eyes: Pupils are equal, round, and reactive to light. Conjunctivae and EOM are normal.  Neck: Neck supple.  Cardiovascular: Normal rate and regular rhythm.  No murmur heard. Pulmonary/Chest: Effort normal and breath sounds normal. No respiratory distress.  Abdominal: Soft. Normal appearance and bowel sounds are normal. There is generalized tenderness. There is no rigidity, no rebound and no CVA tenderness.    Musculoskeletal: She exhibits no edema.  Neurological: She is alert.  Skin: Skin is warm and dry.  Psychiatric: She has a normal mood and affect.  Nursing note and vitals reviewed.    ED Treatments /  Results  Labs (all labs ordered are listed, but only abnormal results are displayed) Labs Reviewed  CBC WITH DIFFERENTIAL/PLATELET - Abnormal; Notable for the following components:      Result Value   WBC 16.9 (*)    RBC 5.37 (*)    Hemoglobin 11.3 (*)    MCV 74.3 (*)    MCH 21.0 (*)    MCHC 28.3 (*)    RDW 19.9 (*)    Platelets 406 (*)    Neutro Abs 13.6 (*)    Monocytes Absolute 1.4 (*)    All other components within normal limits  COMPREHENSIVE METABOLIC PANEL - Abnormal; Notable for the following components:   Potassium 3.2 (*)    Glucose, Bld 138 (*)    BUN 7 (*)    Calcium 8.6 (*)    Albumin 3.0 (*)    All other components within normal limits  URINALYSIS, ROUTINE W REFLEX MICROSCOPIC - Abnormal; Notable for the following components:   Ketones, ur 5 (*)    Protein, ur 30 (*)    All other components within normal limits  PROTIME-INR - Abnormal; Notable for the following components:   Prothrombin Time 35.2 (*)    All other components within normal limits  URINE CULTURE  CULTURE, BLOOD (ROUTINE X 2)  CULTURE, BLOOD (ROUTINE X 2)  LIPASE, BLOOD  HIV ANTIBODY (ROUTINE TESTING W REFLEX)  BASIC METABOLIC PANEL  CBC  PROTIME-INR  I-STAT CG4 LACTIC ACID, ED  I-STAT TROPONIN, ED  I-STAT CG4 LACTIC ACID, ED    EKG EKG Interpretation  Date/Time:  Sunday July 08 2018 09:03:41 EDT Ventricular Rate:  99 PR Interval:    QRS Duration: 106 QT Interval:  357 QTC Calculation: 459 R Axis:   -61 Text Interpretation:  Sinus rhythm Left anterior fascicular block Abnormal R-wave progression, early transition Left ventricular hypertrophy ST elevation, consider inferior injury When compared to prior, no significant changes seen.  No STEMI Confirmed by Antony Blackbird [phone]) on 07/08/2018 10:13:15 AM   Radiology Dg Chest 2 View  Result Date: 07/08/2018 CLINICAL DATA:  Crackles in lung, weakness. EXAM: CHEST - 2 VIEW COMPARISON:  Chest x-rays dated 07/03/2018 and 10/18/2017.  Chest CT dated 02/09/2018. FINDINGS: Today's study is slightly hypoinspiratory with crowding of the perihilar and bibasilar bronchovascular markings. Chronic atelectasis/scarring in the LEFT mid lung region. No new airspace opacity to suggest a developing pneumonia. No pleural effusion or pneumothorax seen. Heart size and mediastinal contours are stable. No acute or suspicious osseous finding. IMPRESSION: Low lung volumes. No active cardiopulmonary disease. No evidence of pneumonia or pulmonary edema. Electronically Signed   By: Franki Cabot M.D.   On: 07/08/2018 09:55   Ct Abdomen Pelvis W Contrast  Result Date: 07/08/2018 CLINICAL DATA:  Lower abdominal pain for 2 weeks, history of UTI EXAM: CT ABDOMEN AND PELVIS WITH CONTRAST TECHNIQUE: Multidetector CT imaging of the abdomen and pelvis was performed using the standard protocol following bolus administration of intravenous contrast. CONTRAST:  180mL OMNIPAQUE IOHEXOL 300 MG/ML  SOLN COMPARISON:  02/09/18 FINDINGS: Lower chest: No acute abnormality. Hepatobiliary: Gallbladder is been surgically removed. The liver demonstrates some patchy decreased attenuation within the right and left lobes as well as the caudate lobe. In retrospect this may have been present on the precontrast images of the prior exam. This persists somewhat on the delayed images although incompletely evaluated on this exam. Pancreas: Unremarkable. No pancreatic ductal dilatation or surrounding inflammatory changes. Spleen: Scattered small cysts are noted. These are stable  from the prior exam. Adrenals/Urinary Tract: Adrenal glands are within normal limits. Kidneys are well visualized bilaterally. No renal calculi or obstructive changes are noted. Bladder is decompressed. Stomach/Bowel: There are changes consistent with sigmoid diverticulitis. Considerable pericolonic inflammatory changes are noted. Diffuse wall thickening is noted. No definitive perforation or abscess formation is seen. A  few wide-mouth diverticula are noted involved in the inflammatory change. The more proximal colon is unremarkable. The appendix is not well visualized although no inflammatory changes are noted. The stomach is within normal limits. No small bowel abnormality is seen. Vascular/Lymphatic: Aortic atherosclerosis. No enlarged abdominal or pelvic lymph nodes. Reproductive: Status post hysterectomy. No adnexal masses. Other: No abdominal wall hernia or abnormality. No abdominopelvic ascites. Musculoskeletal: Postsurgical changes are noted in the lower lumbar spine. Degenerative changes are seen. No acute bony abnormality is noted. IMPRESSION: Changes consistent with sigmoid diverticulitis. No definitive perforation or abscess formation is noted at this time. Areas of decreased attenuation within the liver in both the right and left lobes. This may be related to the timing of the contrast bolus and perfusion anomaly but it does persist in the right lobe on the delayed images. Nonemergent MRI when the patient's condition improves to allow for optimum imaging is recommended for further evaluation. Electronically Signed   By: Inez Catalina M.D.   On: 07/08/2018 11:58    Procedures Procedures (including critical care time)  CRITICAL CARE Performed by: Gwenyth Allegra Kohl Polinsky Total critical care time: 35 minutes Critical care time was exclusive of separately billable procedures and treating other patients. Critical care was necessary to treat or prevent imminent or life-threatening deterioration. Critical care was time spent personally by me on the following activities: development of treatment plan with patient and/or surrogate as well as nursing, discussions with consultants, evaluation of patient's response to treatment, examination of patient, obtaining history from patient or surrogate, ordering and performing treatments and interventions, ordering and review of laboratory studies, ordering and review of  radiographic studies, pulse oximetry and re-evaluation of patient's condition.   Medications Ordered in ED Medications  aspirin EC tablet 81 mg (has no administration in time range)  losartan (COZAAR) tablet 25 mg (has no administration in time range)  pravastatin (PRAVACHOL) tablet 40 mg (has no administration in time range)  methocarbamol (ROBAXIN) tablet 500 mg (500 mg Oral Given 07/08/18 1513)  pregabalin (LYRICA) capsule 25 mg (25 mg Oral Given 07/08/18 1513)  diclofenac sodium (VOLTAREN) 1 % transdermal gel 2 g (has no administration in time range)  lidocaine (LIDODERM) 5 % 1 patch (has no administration in time range)  ondansetron (ZOFRAN) tablet 4 mg (has no administration in time range)    Or  ondansetron (ZOFRAN) injection 4 mg (has no administration in time range)  ibuprofen (ADVIL,MOTRIN) tablet 600 mg (0 mg Oral Duplicate 1/74/08 1448)  0.9 %  sodium chloride infusion ( Intravenous Rate/Dose Change 07/08/18 1602)  oxyCODONE-acetaminophen (PERCOCET/ROXICET) 5-325 MG per tablet 1 tablet (1 tablet Oral Given 07/08/18 1548)    And  oxyCODONE (Oxy IR/ROXICODONE) immediate release tablet 5 mg (5 mg Oral Given 07/08/18 1601)  fentaNYL (SUBLIMAZE) injection 25 mcg (has no administration in time range)  metoprolol succinate (TOPROL-XL) 24 hr tablet 25 mg (has no administration in time range)  piperacillin-tazobactam (ZOSYN) IVPB 3.375 g (has no administration in time range)  Warfarin - Pharmacist Dosing Inpatient (has no administration in time range)  sodium chloride 0.9 % bolus 1,000 mL (0 mLs Intravenous Stopped 07/08/18 1032)  HYDROmorphone (DILAUDID) injection  1 mg (1 mg Intravenous Given 07/08/18 0932)  ondansetron (ZOFRAN) injection 4 mg (4 mg Intravenous Given 07/08/18 0932)  HYDROmorphone (DILAUDID) injection 1 mg (1 mg Intravenous Given 07/08/18 1136)  iohexol (OMNIPAQUE) 300 MG/ML solution 100 mL (100 mLs Intravenous Contrast Given 07/08/18 1108)  piperacillin-tazobactam (ZOSYN) IVPB  3.375 g (3.375 g Intravenous New Bag/Given 07/08/18 1520)     Initial Impression / Assessment and Plan / ED Course  I have reviewed the triage vital signs and the nursing notes.  Pertinent labs & imaging results that were available during my care of the patient were reviewed by me and considered in my medical decision making (see chart for details).     DEBORH PENSE is a 62 y.o. female with a past medical history significant for asthma, GERD, hyperlipidemia, prior diverticulitis, recurrent urinary tract infections, hypertension, prior pulmonary embolism on Coumadin therapy and COPD who presents with abdominal pain, nausea, vomiting, and fevers.  Patient reports that her symptoms feel similar to prior diverticulitis.  She reports her last several days she has had worsened lower abdominal pain that is now 10 out of 10 severity.  She reports nausea and vomiting.  She reports she has had a small bowel movement but no diarrhea.  She denies any blood in her emesis or stool.  She reports she is had fevers and today was found to be 101 on arrival.  Patient reports the pain as sharp and cramping.  She denies trauma.  She denies significant shortness of breath or cough.  She denies any chest pain.  She has no other complaints on arrival.  Next  On exam, patient had diffuse abdominal tenderness.  Lungs had some crackles in the bases.  Chest otherwise nontender.  Mild edema in the legs.  Normal sensation and strength in all extremities.  Clinically I am also concerned about diverticulitis.  Patient will have chest x-ray given the abnormality on lung exam.  Patient will screen laboratory testing and be given pain medicine, nausea medicine, and fluids.  Patient found to have acute diverticulitis.  No evidence of perforation or abscess.  Given patient's recurrent need for pain medication and her ill appearance, patient was not felt stable for outpatient management at home.  Patient admitted for IV  antibiotics and further management with fluids.  Patient admitted in stable condition.     Final Clinical Impressions(s) / ED Diagnoses   Final diagnoses:  Lower abdominal pain  Diverticulitis    ED Discharge Orders    None      Clinical Impression: 1. Lower abdominal pain   2. Diverticulitis     Disposition: Admit  This note was prepared with assistance of Dragon voice recognition software. Occasional wrong-word or sound-a-like substitutions may have occurred due to the inherent limitations of voice recognition software.     [place], Gwenyth Allegra, MD 07/08/18 1710

## 2018-07-08 NOTE — Progress Notes (Signed)
CPAP set up in pt's room. RN to notify RT when pt is ready for bed.

## 2018-07-08 NOTE — ED Triage Notes (Signed)
Pt arrived via [facility]; per EMS pt from home w/ c/o abd pain with recent back sx for disk repair in 05/2017. Pt c/o abd pain with tenderness x 1.5 worsening; pt has low grade temp 100.0; 128 palpated; 95% on RA

## 2018-07-08 NOTE — Progress Notes (Signed)
ANTICOAGULATION CONSULT NOTE - Initial Consult  Pharmacy Consult for warfarin Indication: VTE prophylaxis and hx of PE  Allergies  Allergen Reactions  . Latex Other (See Comments)    Stings and burns me  . Other Nausea And Vomiting and Other (See Comments)    Bleach - burns esophagus, breaks me out and burns me  . Shellfish Allergy Hives and Nausea And Vomiting  . Tape Other (See Comments)    Plastic tape irritates skin    Patient Measurements: Ht: 5'3" Wt: 119 kg  Vital Signs: Temp: 99.7 F (37.6 C) (09/15 1511) Temp Source: Oral (09/15 1511) BP: 149/80 (09/15 1511) Pulse Rate: 92 (09/15 1511)  Labs: Recent Labs    07/08/18 0910 07/08/18 1520  HGB 11.3*  --   HCT 39.9  --   PLT 406*  --   LABPROT  --  35.2*  INR  --  3.54  CREATININE 0.71  --     Estimated Creatinine Clearance: 91.3 mL/min (by C-G formula based on SCr of 0.71 mg/dL).   Medical History: Past Medical History:  Diagnosis Date  . Acid reflux   . Asthma   . Chronic pain   . COPD (chronic obstructive pulmonary disease) ([place])   . Diverticulitis   . Heart murmur   . Hiatal hernia   . High cholesterol   . History of UTI   . Hypertension   . Labral tear of shoulder    left  . Myocardial infarction ([place]) 2014  . Pneumonia 2014  . Pulmonary embolism ([place]) last 2014   x 3   . Ruptured disc, cervical    1 in neck, 1 in midback and 1 in low back, 6 bulging discs,  upper and lower back, 5 slipped discs all the way uop and down back and   . Sleep apnea    uses cpap, pt does not know settings    Medications:  Scheduled:  . [START ON 07/09/2018] aspirin EC  81 mg Oral Q1200  . ibuprofen  600 mg Oral QID  . lidocaine  1 patch Transdermal Q24H  . [START ON 07/09/2018] losartan  25 mg Oral Daily  . [START ON 07/09/2018] metoprolol succinate  25 mg Oral Daily  . [START ON 07/09/2018] pravastatin  40 mg Oral Daily  . pregabalin  25 mg Oral BID    Assessment: 39 yof on warfarin PTA for hx of PE.  Home regimen reported as 2.5 mg daily except 5 mg on Monday and Fridays (of note, missed doses on 9/12+9/13, possibly 9/11; confirm last dose on 9/14).   INR today came back at 3.54, supratherapeutic. Hgb 11.3, plt 406. LFTs WNL. No s/sx of bleeding. Started on concurrent zosyn, may impact INR sensitivity.   Goal of Therapy:  INR 2-3 Monitor platelets by anticoagulation protocol: Yes   Plan:  Will hold warfarin tonight Monitor daily INR, CBC, and for s/sx of bleeding  Doylene Canard, PharmD Clinical Pharmacist  Pager: [phone] Phone: [phone] 07/08/2018,4:20 PM

## 2018-07-08 NOTE — H&P (Addendum)
[facility] Admission History and Physical Service Pager: [phone]  Patient name: [name] Medical record number: [id number] Date of birth: 06/11/1956 Age: 62 y.o. Gender: female  Primary Care Provider: Esaw Grandchild, NP Consultants: None Code Status: DNR (confirmed in ED)  Chief Complaint: abdominal pain  Assessment and Plan: [name] is a 62 y.o. female presenting with two weeks of progressively worsening abdominal pain secondary to diverticulitis. 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   Diverticulitis: CT scan showed changes consistent with sigmoid diverticulitis without perforation or abscess. Patient has had progressively worsening LLQ abdominal pain and a low appetite for 2 weeks prior. Tmax 101.1*F on admission. Previously treated as if she had a UTI. Patient with stable vital signs in ED, but given pain level and length of illness, patient thought to be acute enough for inpatient management with IV abx treatment. Started on zosyn in the ED. Patient also reporting vomiting and nausea. Lactic acid 1.89>1.76. S/p 1L NS bolus in ED. Patient previously hospitalized for Diverticulitis in 2014. Last colonoscopy 2017 showed diverticulosis and repeat recommended in 5 years. Patient s/p hysterectomy 08/2008.  - Admit to med-surg, Attending Dr. Erin Hearing - IV Zosyn (9/15 - ), will transition to po clinda and flagyl once patient more stable - F/u blood cultures collected in ED - Clear liquid diet as tolerated given that patient tolerated soda this afternoon - Up with assistance - am CBC, BMP, routine vitals - Zofran 4mg  q6 PRN - Daily weights - Strict I&Os - Pain control with Fentanyl, Percocet, Ibuprofen, Lidocaine patch, Voltaren Gel, Robaxin, and Lyrica. - IVF at 162mL/hr for 18 hours as  patient has h/o emesis  Recurrent UTI's: Previously on Trimpex and with history of bladder issues s/p laminectomy. Also previously required bactrim chronically. Recent urine culture on 9/06 grew Providencia rettgeri. Patient initially started on augmentin but changed to ceftin 250 BID for 7 days on 9/9. Patient reports compliance with these medications. Patient now asymtpomatic. UA in ED with no signs of infection, urine culture collected. - F/u urine culture  HTN: BPs on admission 119-149 / 73-80. On home Lasix 40 BID, Losartan 50, Metoprolol-XL 50mg . Patient reports taking above meds PRN and has not taken the above medications in several days. - Daily vitals checks - Decrease dosage of home medications to Losartan 25 PO daily and Metoprolol 25mg  daily as per patient she becomes hypotensive. Holding home lasix  - Observe Blood Pressures on these meds before adding others.  Hypokalemia: K 3.2 on admission. Patient on lasix at home.  - Holding lasix - Replete K   Anemia: Chronic. Stable. BL ~11. Patient denies hematochezia or hematemesis.  - Monitor on daily CBC  Leukocytosis: 16.9 WBC on admission. Likely 2/2 diverticulitis.  - Continue IV abx - Monitor fever curve - Monitor on CBC  COPD vs Asthma: only uses albuterol nebulizer at home. Patient with no recorded PFT's. She has only albuterol at home that she uses multiple times per week. May benefit from PFT's and controller medications.  - PRN Albuterol  Chronic Pain with sciatica 2/2 bulging cervical and lumbar discs and OA, stable: at home takes Robaxin 500 q6 PRN Spasms, Voltaren gel, Meloxicam 7.5mg , Percocet and Oxycodone 10 q6 PRN,and  Pregabalin 25 BID. Cr 0.71 on admission. Patient reports that she is attempting to re-establish with a pain  clinic. Patient with history of laminectomy in  - Fentanyl 29mcg q2hrs PRN, Percocet 5q4 PRN, Pregabalin 25 BID, Ibuprofen 600mg  4 times daily - Kpad PRN, Voltaren Gel PRN  History of Pulmonary  Embolism: 3 recorded in the past, on chronic anticoagulation, currently on Warfarin 5mg  tablet MWF and 1/2 tablet the remainder of the week. Goal INR 2-3. Patient reports noncompliance with her medication. She has not had any in the past few days. Likely that another agent would be better to ensure compliance, such as Xarelto.   - Warfarin scheduled per pharmacy - INR - Attempt to adjust anticoagulation given poor compliance such as Xarelto  OSA: wears CPAP at night. Patient does not know settings. - CPAP  Murmur: patient with murmur noted on exam that reports is not new. She has no recent ECHO with last ECHO in 2012. Patient also on Lasix prn for fluid. CXr with no pulmonary congestion noted.  - ECHO to evaluate for CHF and murmur  GERD: Prilosec 40mg  BID, Zofran 8mg  PRN,  - Pantoprazole 40mg   qDaily  HLD: Lipid panel 02/2018 shows Cholesterol 252, Trig 270, HDL 48, LDL 150; ASCVD Risk 7.5%. Takes Pravastatin 40mg  daily at home. Patient has poor medication compliance. - Pravastatin 40mg  qHS - Aspirin 81  Migraines: Sumatriptan nasal spray 5mg  total as needed for migraines. - Sumatriptan if patient develops migraines  Anxiety: Patient with h/o anxiety. She has previously on Zoloft and has required valium in previous hospital stays.  - Monitor - Consider restarting SSRI therapy   Decreased Attenuation of Liver on CT abdomen: May have been related to timing of contrast vs perfusion anomaly. Recommended further MRI imaging as outpatient, but patient with normal LFT's. May consider RUQ [place].   FEN/GI: Clear liquid Prophylaxis: warfarin per pharm  Disposition: admit to med-surg  History of Present Illness:  [name] is a 62 y.o. female presenting with LLQ abdominal pain with mid-thoracic back pain (described as a CVA tenderness) for 1.5-2 weeks, over which the pain has progressively worsened. When the pain began on 06/29/2018 she presented to her PCP where she was diagnosed with a UTI  for a dirty UA and was started on Macrobid. Then on 07/02/2018 the medication was changed from [place] to Cefuroxime. Because of her worsening pain (described as severe, sharp upper left back pain) her PCP told her to go to the ED. She presented to [facility] ED that evening/early on 07/03/2018 where she was treated with Toradol and Hydromorphone.   Although the meds in the ED treated her back pain, her stomach pain continued to worsen. From Wednesday (07/04/2018) on she could not eat or drink and on Thursday (9/12) she began throwing up. On Saturday she was able to keep some soda down. The pain continued to worsen, She developed fevers, chills, and diaphoresis and came back to the emergency department.  Of note, she has been hospitalized with diverticulitis twice before.    Review Of Systems: Per HPI with the following additions:  Review of Systems  Constitutional: Positive for chills and fever.  Eyes: Negative for blurred vision and double vision.  Respiratory: Positive for shortness of breath.   Cardiovascular: Negative for chest pain.  Gastrointestinal: Positive for abdominal pain, constipation, nausea and vomiting. Negative for blood in stool, diarrhea and melena.  Genitourinary: Negative for dysuria.  Musculoskeletal: Positive for back pain.  Neurological: Positive for headaches. Negative for dizziness.    Patient Active Problem List   Diagnosis Date Noted  . Diverticulitis  07/08/2018  . Deep venous thrombosis ([place]) 06/29/2018  . Urinary frequency 06/29/2018  . Acute bronchitis with COPD ([place]) 04/11/2018  . Chronic pain due to trauma 04/10/2018  . Osteoarthritis of left knee 04/10/2018  . 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 Lumbar post-laminectomy syndrome 04/10/2018  . Chest tightness 11/28/2017  . Anxiety 11/02/2017  . Healthcare maintenance 10/31/2017  . Chronic bilateral back pain 10/31/2017  . Pulmonary embolism ([place]) 10/31/2017  . COPD (chronic  obstructive pulmonary disease) ([place]) 10/31/2017  . Sleep apnea 10/31/2017  . History of Coumadin therapy 10/31/2017  . Lumbar radiculopathy, chronic   . Essential hypertension   . Lumbar back pain with radiculopathy affecting right lower extremity 06/05/2017  . Palliative care by specialist   . Right leg pain 06/02/2017  . Severe low back pain 06/02/2017  . Acute right-sided low back pain with sciatica   . Recurrent UTI 05/10/2016  . Urinary incontinence 12/30/2015  . Chronic pulmonary embolism ([place]) 04/17/2013  . UTI (urinary tract infection) 04/17/2013  . Diverticulitis of sigmoid colon 04/16/2013  . Obesity, Class III, BMI 40-49.9 (morbid obesity) ([place]) 04/16/2013  . Chronic narcotic dependence ([place]) 04/16/2013  . Asthma 11/16/2011  . GERD (gastroesophageal reflux disease) 11/16/2011  . Hyperlipidemia 11/16/2011    Past Medical History: Past Medical History:  Diagnosis Date  . Acid reflux   . Asthma   . Chronic pain   . COPD (chronic obstructive pulmonary disease) ([place])   . Diverticulitis   . Heart murmur   . Hiatal hernia   . High cholesterol   . History of UTI   . Hypertension   . Labral tear of shoulder    left  . Myocardial infarction ([place]) 2014  . Pneumonia 2014  . Pulmonary embolism ([place]) last 2014   x 3   . Ruptured disc, cervical    1 in neck, 1 in midback and 1 in low back, 6 bulging discs,  upper and lower back, 5 slipped discs all the way uop and down back and   . Sleep apnea    uses cpap, pt does not know settings    Past Surgical History: Past Surgical History:  Procedure Laterality Date  .  bone spurs removed from feet Bilateral   . ABDOMINAL HYSTERECTOMY    . BACK SURGERY     fusion x 2 lower back, laser surgery mid and back and neck also done  . BREAST BIOPSY Right   . CARPAL TUNNEL RELEASE Right   . CHOLECYSTECTOMY    . colon polyps    . COLONOSCOPY WITH PROPOFOL N/A 04/08/2016   Procedure: COLONOSCOPY WITH PROPOFOL;  Surgeon: Carol Ada, MD;  Location: [facility] ENDOSCOPY;  Service: Endoscopy;  Laterality: N/A;  . colonscopy     x 2, with polyps removed both times  . IR FL GUIDED LOC OF NEEDLE/CATH TIP FOR SPINAL INJECTION RT  06/04/2017  . JOINT REPLACEMENT    . REPLACEMENT TOTAL KNEE Right   . TUBAL LIGATION      Social History: Social History   Tobacco Use  . Smoking status: Never Smoker  . Smokeless tobacco: Never Used  Substance Use Topics  . Alcohol use: No  . Drug use: No   Family History: Family History  Problem Relation Age of Onset  . Heart failure Mother   . COPD Mother   . Heart failure Father   . COPD Father   . Cancer Other   . Heart failure Other   .  Breast cancer Maternal Aunt   . Breast cancer Paternal Aunt   . Breast cancer Cousin    Allergies and Medications: Allergies  Allergen Reactions  . Latex Other (See Comments)    Stings and burns me  . Other Nausea And Vomiting and Other (See Comments)    Bleach - burns esophagus, breaks me out and burns me  . Shellfish Allergy Hives and Nausea And Vomiting  . Tape Other (See Comments)    Plastic tape irritates skin   No current facility-administered medications on file prior to encounter.    Current Outpatient Medications on File Prior to Encounter  Medication Sig Dispense Refill  . acetaminophen (TYLENOL) 325 MG tablet Take 650 mg by mouth every 6 (six) hours as needed.    Marland Kitchen albuterol (PROVENTIL HFA;VENTOLIN HFA) 108 (90 Base) MCG/ACT inhaler Inhale 2 puffs into the lungs every 6 (six) hours as needed. For wheezing 1 Inhaler 2  . albuterol (PROVENTIL) (2.5 MG/3ML) 0.083% nebulizer solution Take 3 mLs (2.5 mg total) by nebulization every 6 (six) hours as needed for wheezing or shortness of breath. 150 mL 1  . aspirin EC 81 MG tablet Take 81 mg by mouth daily at 12 noon.     . cefUROXime (CEFTIN) 250 MG tablet Take 1 tablet (250 mg total) by mouth 2 (two) times daily with a meal. 14 tablet 0  . diclofenac sodium (VOLTAREN) 1 % GEL Apply 2 g  topically 4 (four) times daily as needed (For pain in shoulder, knees, back and neck.).     Marland Kitchen furosemide (LASIX) 40 MG tablet TAKE 1 TABLET (40 MG TOTAL) BY MOUTH 2 (TWO) TIMES DAILY AS NEEDED FOR FLUID. (Patient taking differently: Take 20 mg by mouth daily. ) 180 tablet 0  . Hypromellose (ARTIFICIAL TEARS OP) Place 1 drop into both eyes daily as needed (dry eyes).    Marland Kitchen lactulose (CHRONULAC) 10 GM/15ML solution Take 15 mLs (10 g total) by mouth 2 (two) times daily. 240 mL 0  . lidocaine (LIDODERM) 5 % Place 1-3 patches onto the skin as needed.     Marland Kitchen losartan (COZAAR) 50 MG tablet TAKE 1 TABLET BY MOUTH DAILY AS NEEDED FOR BLOOD PRESSURE (Patient taking differently: Take 50 mg by mouth as needed. ) 90 tablet 0  . meloxicam (MOBIC) 7.5 MG tablet Take 1 tablet (7.5 mg total) by mouth daily. 30 tablet 0  . methocarbamol (ROBAXIN) 500 MG tablet Take 1 tablet (500 mg total) by mouth every 6 (six) hours as needed for muscle spasms. 30 tablet 1  . metoprolol succinate (TOPROL-XL) 50 MG 24 hr tablet TAKE 1 TABLET (50 MG TOTAL) BY MOUTH DAILY. TAKE WITH OR IMMEDIATELY FOLLOWING A MEAL. 90 tablet 0  . nitroGLYCERIN (NITROSTAT) 0.4 MG SL tablet Place 1 tablet (0.4 mg total) under the tongue every 5 (five) minutes as needed for chest pain. 25 tablet 0  . nystatin (NYAMYC) powder Apply 1 g topically daily as needed (rash).    Marland Kitchen omeprazole (PRILOSEC) 40 MG capsule TAKE 1 CAPSULE BY MOUTH TWICE A DAY (Patient taking differently: Take 40 mg by mouth daily. ) 180 capsule 0  . ondansetron (ZOFRAN ODT) 8 MG disintegrating tablet 8mg  ODT q4 hours prn nausea (Patient taking differently: Take 8 mg by mouth every 8 (eight) hours as needed for nausea or vomiting. 8mg  ODT q4 hours prn nausea) 10 tablet 0  . Oxycodone HCl 10 MG TABS Take 1 tablet (10 mg total) by mouth every 6 (six) hours  as needed (for severe pain). 10 tablet 0  . oxyCODONE-acetaminophen (PERCOCET) 10-325 MG tablet Take 1 tablet by mouth every 6 (six) hours as  needed for pain. 15 tablet 0  . pravastatin (PRAVACHOL) 40 MG tablet TAKE 1 TABLET BY MOUTH DAILY AT 12 NOON. (Patient taking differently: Take 40 mg by mouth daily. TAKE 1 TABLET BY MOUTH DAILY AT 12 NOON.) 90 tablet 2  . pregabalin (LYRICA) 25 MG capsule Take 1 capsule (25 mg total) by mouth 2 (two) times daily. 60 capsule 1  . PRESCRIPTION MEDICATION CPAP    . SUMAtriptan (IMITREX) 5 MG/ACT nasal spray Place 1 spray (5 mg total) into the nose every 2 (two) hours as needed for migraine. Max daily dose 2 sprays/day 1 Inhaler 2  . trimethoprim (TRIMPEX) 100 MG tablet Take 100 mg by mouth daily.    . Vitamin D, Ergocalciferol, (DRISDOL) 50000 units CAPS capsule Take 50,000 Units by mouth every 7 (seven) days.    Marland Kitchen warfarin (COUMADIN) 5 MG tablet TAKE 1 TABLET ON MONDAY,WEDNESDAY,FRIDAY AND A 1/2 TABLET THE REST OF THE WEEK (Patient taking differently: Take 5 mg by mouth daily at 6 PM. TAKE 5 mg TABLET ON MONDAY,FRIDAY AND 2.5 mg tues , thurs Saturday and Sunday Alternate every other Wednesday 5 mg and 2.5 mg) 25 tablet 1  . amoxicillin-clavulanate (AUGMENTIN) 875-125 MG tablet Take 1 tablet by mouth 2 (two) times daily. (Patient not taking: Reported on 07/08/2018) 20 tablet 0    Objective: BP (!) 149/80 (BP Location: Right Wrist)   Pulse 92   Temp 99.7 F (37.6 C) (Oral)   Resp 17   SpO2 94%   Physical Exam  Constitutional: She is oriented to person, place, and time. She appears well-developed and well-nourished. She appears ill.  HENT:  Head: Normocephalic and atraumatic.  Eyes: EOM are normal.  Cardiovascular: Normal rate and regular rhythm.  3/6 systolic murmur; distant heart sounds  Pulmonary/Chest: No respiratory distress.  Distant breath sounds  Abdominal: She exhibits no distension and no ascites. Bowel sounds are increased. There is tenderness (to palpation in left lower quadrant).  Obese  Neurological: She is alert and oriented to person, place, and time.  Skin: Skin is warm  and dry. Capillary refill takes less than 2 seconds. She is not diaphoretic.   Labs and Imaging: CBC BMET  Recent Labs  Lab 07/08/18 0910  WBC 16.9*  HGB 11.3*  HCT 39.9  PLT 406*   Recent Labs  Lab 07/08/18 0910  NA 137  K 3.2*  CL 101  CO2 24  BUN 7*  CREATININE 0.71  GLUCOSE 138*  CALCIUM 8.6*     Lipid Panel     Component Value Date/Time   CHOL 252 (H) 03/07/2018 1422   TRIG 270 (H) 03/07/2018 1422   HDL 48 03/07/2018 1422   CHOLHDL 5.3 (H) 03/07/2018 1422   LDLCALC 150 (H) 03/07/2018 1422    Dg Chest 2 View  Result Date: 07/08/2018 CLINICAL DATA:  Crackles in lung, weakness. EXAM: CHEST - 2 VIEW COMPARISON:  Chest x-rays dated 07/03/2018 and 10/18/2017. Chest CT dated 02/09/2018. FINDINGS: Today's study is slightly hypoinspiratory with crowding of the perihilar and bibasilar bronchovascular markings. Chronic atelectasis/scarring in the LEFT mid lung region. No new airspace opacity to suggest a developing pneumonia. No pleural effusion or pneumothorax seen. Heart size and mediastinal contours are stable. No acute or suspicious osseous finding. IMPRESSION: Low lung volumes. No active cardiopulmonary disease. No evidence of pneumonia or pulmonary  edema. Electronically Signed   By: Franki Cabot M.D.   On: 07/08/2018 09:55   Dg Chest 2 View  Result Date: 07/03/2018 CLINICAL DATA:  Left upper back pain for 1 month. Shortness of breath tonight. EXAM: CHEST - 2 VIEW COMPARISON:  Chest CT 02/09/2018, radiograph 10/18/2017 FINDINGS: Heart size upper normal with unchanged mediastinal contours. Minimal right basilar scarring, unchanged. Pulmonary vasculature is normal. No consolidation, pleural effusion, or pneumothorax. No acute osseous abnormalities are seen. Multilevel degenerative change throughout spine. IMPRESSION: No acute chest finding. Electronically Signed   By: Keith Rake M.D.   On: 07/03/2018 03:12   Ct Abdomen Pelvis W Contrast  Result Date:  07/08/2018 CLINICAL DATA:  Lower abdominal pain for 2 weeks, history of UTI EXAM: CT ABDOMEN AND PELVIS WITH CONTRAST TECHNIQUE: Multidetector CT imaging of the abdomen and pelvis was performed using the standard protocol following bolus administration of intravenous contrast. CONTRAST:  151mL OMNIPAQUE IOHEXOL 300 MG/ML  SOLN COMPARISON:  02/09/18 FINDINGS: Lower chest: No acute abnormality. Hepatobiliary: Gallbladder is been surgically removed. The liver demonstrates some patchy decreased attenuation within the right and left lobes as well as the caudate lobe. In retrospect this may have been present on the precontrast images of the prior exam. This persists somewhat on the delayed images although incompletely evaluated on this exam. Pancreas: Unremarkable. No pancreatic ductal dilatation or surrounding inflammatory changes. Spleen: Scattered small cysts are noted. These are stable from the prior exam. Adrenals/Urinary Tract: Adrenal glands are within normal limits. Kidneys are well visualized bilaterally. No renal calculi or obstructive changes are noted. Bladder is decompressed. Stomach/Bowel: There are changes consistent with sigmoid diverticulitis. Considerable pericolonic inflammatory changes are noted. Diffuse wall thickening is noted. No definitive perforation or abscess formation is seen. A few wide-mouth diverticula are noted involved in the inflammatory change. The more proximal colon is unremarkable. The appendix is not well visualized although no inflammatory changes are noted. The stomach is within normal limits. No small bowel abnormality is seen. Vascular/Lymphatic: Aortic atherosclerosis. No enlarged abdominal or pelvic lymph nodes. Reproductive: Status post hysterectomy. No adnexal masses. Other: No abdominal wall hernia or abnormality. No abdominopelvic ascites. Musculoskeletal: Postsurgical changes are noted in the lower lumbar spine. Degenerative changes are seen. No acute bony abnormality is  noted. IMPRESSION: Changes consistent with sigmoid diverticulitis. No definitive perforation or abscess formation is noted at this time. Areas of decreased attenuation within the liver in both the right and left lobes. This may be related to the timing of the contrast bolus and perfusion anomaly but it does persist in the right lobe on the delayed images. Nonemergent MRI when the patient's condition improves to allow for optimum imaging is recommended for further evaluation. Electronically Signed   By: Inez Catalina M.D.   On: 07/08/2018 11:58    Milus Banister, [place], PGY-1 07/08/2018 4:37 PM  FPTS Upper-Level Resident Addendum   I have independently interviewed and examined the patient. I have discussed the above with the original author and agree with their documentation. My edits for correction/addition/clarification are in purple. Please see also any attending notes.    [place] Myna Freimark, DO PGY-2, [facility] 07/08/2018 5:21 PM  FPTS Service pager: [phone] (text pages welcome through [facility])

## 2018-07-09 ENCOUNTER — Inpatient Hospital Stay (HOSPITAL_COMMUNITY): Payer: Medicare HMO

## 2018-07-09 DIAGNOSIS — I10 Essential (primary) hypertension: Secondary | ICD-10-CM

## 2018-07-09 DIAGNOSIS — K5792 Diverticulitis of intestine, part unspecified, without perforation or abscess without bleeding: Secondary | ICD-10-CM

## 2018-07-09 DIAGNOSIS — R1030 Lower abdominal pain, unspecified: Secondary | ICD-10-CM

## 2018-07-09 LAB — BASIC METABOLIC PANEL
Anion gap: 8 (ref 5–15)
BUN: 5 mg/dL — AB (ref 8–23)
CALCIUM: 8.3 mg/dL — AB (ref 8.9–10.3)
CO2: 28 mmol/L (ref 22–32)
CREATININE: 0.67 mg/dL (ref 0.44–1.00)
Chloride: 106 mmol/L (ref 98–111)
GFR calc Af Amer: >60 mL/min (ref >60)
GFR calc non Af Amer: >60 mL/min (ref >60)
Glucose, Bld: 93 mg/dL (ref 70–99)
Potassium: 3.6 mmol/L (ref 3.5–5.1)
SODIUM: 142 mmol/L (ref 135–145)

## 2018-07-09 LAB — ECHOCARDIOGRAM COMPLETE
HEIGHTINCHES: 63 in
Weight: 4218.7226 oz

## 2018-07-09 LAB — URINE CULTURE

## 2018-07-09 LAB — CBC
HCT: 34.2 % — ABNORMAL LOW (ref 36.0–46.0)
Hemoglobin: 9.7 g/dL — ABNORMAL LOW (ref 12.0–15.0)
MCH: 21.1 pg — ABNORMAL LOW (ref 26.0–34.0)
MCHC: 28.4 g/dL — ABNORMAL LOW (ref 30.0–36.0)
MCV: 74.5 fL — ABNORMAL LOW (ref 78.0–100.0)
PLATELETS: 365 10*3/uL (ref 150–400)
RBC: 4.59 MIL/uL (ref 3.87–5.11)
RDW: 19.3 % — AB (ref 11.5–15.5)
WBC: 6.9 10*3/uL (ref 4.0–10.5)

## 2018-07-09 LAB — PROTIME-INR
INR: 3.64
PROTHROMBIN TIME: 35.9 s — AB (ref 11.4–15.2)

## 2018-07-09 LAB — HIV ANTIBODY (ROUTINE TESTING W REFLEX): HIV Screen 4th Generation wRfx: NONREACTIVE

## 2018-07-09 MED ORDER — METRONIDAZOLE 500 MG PO TABS
500.0000 mg | ORAL_TABLET | Freq: Three times a day (TID) | ORAL | Status: DC
  Administered 2018-07-09 – 2018-07-10 (×4): 500 mg via ORAL
  Filled 2018-07-09 (×4): qty 1

## 2018-07-09 MED ORDER — CIPROFLOXACIN HCL 500 MG PO TABS: 500.0000 mg | ORAL_TABLET | Freq: Two times a day (BID) | ORAL | Status: DC

## 2018-07-09 MED ORDER — CIPROFLOXACIN HCL 500 MG PO TABS
750.0000 mg | ORAL_TABLET | Freq: Two times a day (BID) | ORAL | Status: DC
  Administered 2018-07-09 – 2018-07-10 (×3): 750 mg via ORAL
  Filled 2018-07-09 (×3): qty 1

## 2018-07-09 MED ORDER — ADULT MULTIVITAMIN W/MINERALS CH
1.0000 | ORAL_TABLET | Freq: Every day | ORAL | Status: DC
  Administered 2018-07-09 – 2018-07-10 (×2): 1 via ORAL
  Filled 2018-07-09 (×2): qty 1

## 2018-07-09 MED ORDER — BOOST / RESOURCE BREEZE PO LIQD CUSTOM
1.0000 | Freq: Three times a day (TID) | ORAL | Status: DC
  Administered 2018-07-09 – 2018-07-10 (×2): 1 via ORAL

## 2018-07-09 NOTE — Progress Notes (Signed)
ANTICOAGULATION CONSULT NOTE - Follow Up Consult  Pharmacy Consult for Warfarin Indication: VTE prophylaxis and Hx of PE  Allergies  Allergen Reactions  . Latex Other (See Comments)    Stings and burns me  . Other Nausea And Vomiting and Other (See Comments)    Bleach - burns esophagus, breaks me out and burns me  . Shellfish Allergy Hives and Nausea And Vomiting  . Tape Other (See Comments)    Plastic tape irritates skin    Patient Measurements: Height: 5\' 3"  (160 cm) Weight: 263 lb 10.7 oz (119.6 kg) IBW/kg (Calculated) : 52.4  Vital Signs: Temp: 98.2 F (36.8 C) (09/16 0657) Temp Source: Oral (09/16 0657) BP: 119/68 (09/16 0657) Pulse Rate: 69 (09/16 0657)  Labs: Recent Labs    07/08/18 0910 07/08/18 1520 07/09/18 0714  HGB 11.3*  --  9.7*  HCT 39.9  --  34.2*  PLT 406*  --  365  LABPROT  --  35.2* 35.9*  INR  --  3.54 3.64  CREATININE 0.71  --  0.67    Estimated Creatinine Clearance: 91.3 mL/min (by C-G formula based on SCr of 0.67 mg/dL).  Assessment: 33 yof on warfarin PTA for hx of PE. Home regimen reported as 2.5 mg daily except 5 mg on Monday and Fridays (of note, missed doses on 9/12+9/13, possibly 9/11; confirm last dose on 9/14).   INR today is supra-therapeutic and up some at 3.64. Hgb down some today at 9.7. Platelets are within normal limits. No bleeding reported. Concurrent Zosyn may impact INR. Also only on clear liquid diet therefore dietary changes may as well.   Goal of Therapy:  INR 2-3 Monitor platelets by anticoagulation protocol: Yes   Plan:  Hold Warfarin again this PM.  Daily PT/INR.   Sloan Leiter, PharmD, BCPS, BCCCP Clinical Pharmacist Clinical phone 07/09/2018 until 3:30PM [phone] Please refer to [facility] for [place] numbers 07/09/2018,9:22 AM

## 2018-07-09 NOTE — Progress Notes (Signed)
Initial Nutrition Assessment  DOCUMENTATION CODES:   Morbid obesity  INTERVENTION:   -Boost Breeze po TID, each supplement provides 250 kcal and 9 grams of protein -MVI with minerals daily -RD will follow for diet advancement and adjust supplement regimen appropriate  NUTRITION DIAGNOSIS:   Inadequate oral intake related to altered GI function as evidenced by other (comment)(clear liquid diet ).  GOAL:   Patient will meet greater than or equal to 90% of their needs  MONITOR:   PO intake, Supplement acceptance, Diet advancement, Labs, Weight trends, Skin, I & O's  REASON FOR ASSESSMENT:   Malnutrition Screening Tool    ASSESSMENT:   [name] is a 62 y.o. female presenting with two weeks of progressively worsening abdominal pain secondary to diverticulitis. 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   Pt admitted with diverticulitis.   Pt sleeping soundly at time of visit and did not respond to voice.   Pt tolerating clear liquids well; noted meal completion 75%. Clear liquid diet will not meet pt's estimated nutritional needs, so RD will add supplements to optimize intake.   Reviewed wt hx; wt has been stable over the past 3 months  Labs reviewed.  NUTRITION - FOCUSED PHYSICAL EXAM:    Most Recent Value  Orbital Region  No depletion  Upper Arm Region  No depletion  Thoracic and Lumbar Region  No depletion  Buccal Region  No depletion  Temple Region  No depletion  Clavicle Bone Region  No depletion  Clavicle and Acromion Bone Region  No depletion  Scapular Bone Region  No depletion  Dorsal Hand  No depletion  Patellar Region  No depletion  Anterior Thigh Region  No depletion  Posterior Calf Region  No depletion  Edema (RD Assessment)  None  Hair  Reviewed  Eyes  Reviewed  Mouth  Reviewed  Skin   Reviewed  Nails  Reviewed       Diet Order:   Diet Order            Diet clear liquid Room service appropriate? Yes; Fluid consistency: Thin  Diet effective now              EDUCATION NEEDS:   No education needs have been identified at this time  Skin:  Skin Assessment: Reviewed RN Assessment  Last BM:  07/08/18  Height:   Ht Readings from Last 1 Encounters:  07/08/18 5\' 3"  (1.6 m)    Weight:   Wt Readings from Last 1 Encounters:  07/08/18 119.6 kg    Ideal Body Weight:  52.3 kg  BMI:  Body mass index is 46.71 kg/m.  Estimated Nutritional Needs:   Kcal:  1650-1850  Protein:  90-105 grams  Fluid:  >1.6 L    Nathaly Dawkins A. Jimmye Norman, RD, LDN, CDE Pager: [phone] After hours Pager: [phone]

## 2018-07-09 NOTE — Progress Notes (Signed)
  Echocardiogram 2D Echocardiogram has been performed.  Jannett Celestine 07/09/2018, 1:27 PM

## 2018-07-09 NOTE — Progress Notes (Signed)
   07/09/18 1400  Clinical Encounter Type  Visited With Patient  Visit Type Initial  Referral From Nurse  Spiritual Encounters  Spiritual Needs Prayer;Emotional;Sacred text  Stress Factors  Patient Stress Factors Exhausted;Financial concerns;Health changes;Major life changes   Responded to spiritual needs requested by PT. I noticed PT was alert and open for dialogue. I listened to her concerns about decisions she is needing to make after leaving the hospital. I also observed her emotional pain she shared and offered an open ear as PT opened up about her past. PT was encouraged about the Chaplain visit and was happy for the prayer. Chaplain available upon request.

## 2018-07-09 NOTE — Progress Notes (Signed)
Family Medicine Teaching Service Daily Progress Note Intern Pager: [phone]  Patient name: [name] Medical record number: [id number] Date of birth: 1956-09-26 Age: 62 y.o. Gender: female  Primary Care Provider: Esaw Grandchild, NP Consultants: none Code Status: DNR  Pt Overview and Major Events to Date:  Hospital Day 1 Admitted: 07/08/2018  Assessment and Plan: [name] is a 62 y.o. female presenting with two weeks of progressively worsening abdominal pain secondary to diverticulitis. 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   Diverticulitis: patient has stated this is the best she has felt in two weeks.  She tried liquid diet yesterday and became nauseous halfway through but this subsided with zofran.  Patient is diffusely tender to palpation.  Left side is more tender than right side.  Currently afebrile. Leukocytosis 16.9 --> 6.9  Last colonoscopy 2017 showed diverticulosis and repeat recommended in 5 years. Patient s/p hysterectomy 08/2008.  - d/c zosyn.  Begin cipro 750 BID and flagyl 500 TID (9/16 - 9/21) - Clear liquid diet as tolerated.  Advance as able.   - Up with assistance - am CBC, BMP, routine vitals - Zofran 4mg  q6 PRN - Daily weights - Strict I&Os - Pain control with Percocet, Ibuprofen, Lidocaine patch, Voltaren Gel, Robaxin, and Lyrica.  D/c'd fentanyl PRN.   HTN: BPs on admission 119-149 / 73-80. On home Lasix 40 BID, Losartan 50, Metoprolol-XL 50mg . Patient reports taking above meds if her BP is above 120/80, otherwise will not due to concern for hypotension.  - Daily vitals checks - Decrease dosage of home medications to Losartan 25 PO daily and Metoprolol 25mg  daily as per patient she becomes hypotensive.  - Holding home lasix  - Observe Blood Pressures on these meds before adding  others.  Chronic Pain with sciatica 2/2 bulging cervical and lumbar discs and OA, stable: at home takes Robaxin 500 q6 PRN Spasms, Voltaren gel, Meloxicam 7.5mg , Percocet and Oxycodone 10 q6 PRN,and  Pregabalin 25 BID. Cr 0.71 on admission. Patient reports that she is attempting to re-establish with a pain clinic. Patient with history of laminectomy in  - Fentanyl 15mcg q2hrs PRN, Percocet 5q4 PRN, Pregabalin 25 BID, Ibuprofen 600mg  4 times daily - Kpad PRN, Voltaren Gel PRN  Murmur: patient with murmur noted on exam that reports is not new. She has no recent ECHO with last ECHO in 2012. Patient also on Lasix prn for fluid. CXr with no pulmonary congestion noted.  - ECHO: normal systolic function.  60-65% EF.    Recurrent UTI's: not believed to have UTI currently.  Urine culture unrevealing.    Hypokalemia: K 3.2 on admission. 3.6 this am. Patient on lasix at home.  - Holding lasix - Replete K as needed.   Anemia: Chronic. Stable. BL ~9.7 this am. Patient denies hematochezia or hematemesis.  - Monitor on daily CBC  COPD vs Asthma: only uses albuterol nebulizer at home. Patient with no recorded PFT's. She has only albuterol at home that she uses multiple times per week. May benefit from PFT's and controller medications.  - PRN Albuterol.   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 Goal INR 2-3. She has not had any in the past few days. Does not want a DOAC due to  perceived side effects. .   - Warfarin scheduled per pharmacy - INR - 3.54 --> 3.64 - Attempt to adjust anticoagulation given poor compliance such as Xarelto  OSA: wears CPAP at night. Patient does not know settings. - CPAP  GERD: Prilosec 40mg  BID, Zofran 8mg  PRN,  - Pantoprazole 40mg   qDaily  HLD: Lipid panel 02/2018 shows Cholesterol 252, Trig 270, HDL 48, LDL 150; ASCVD Risk 7.5%. Takes Pravastatin  40mg  daily at home. Patient has poor medication compliance. - Pravastatin 40mg  qHS - Aspirin 81  Migraines: Sumatriptan nasal spray 5mg  total as needed for migraines. - Sumatriptan if patient develops migraines  Anxiety: Patient with h/o anxiety. She has previously on Zoloft and has required valium in previous hospital stays.  - Monitor - Consider restarting SSRI therapy   Decreased Attenuation of Liver on CT abdomen: May have been related to timing of contrast vs perfusion anomaly. Recommended further MRI imaging as outpatient, but patient with normal LFT's. May consider RUQ [place].   FEN/GI: Clear liquid Prophylaxis: warfarin per pharm  Disposition: admit to med-surg   Medications: Scheduled Meds: . aspirin EC  81 mg Oral Q1200  . ibuprofen  600 mg Oral QID  . Influenza vac split quadrivalent PF  0.5 mL Intramuscular Tomorrow-1000  . lidocaine  1 patch Transdermal Q24H  . losartan  25 mg Oral Daily  . metoprolol succinate  25 mg Oral Daily  . pantoprazole  20 mg Oral Daily  . pravastatin  40 mg Oral Daily  . pregabalin  25 mg Oral BID  . Warfarin - Pharmacist Dosing Inpatient   Does not apply q1800   Continuous Infusions: . sodium chloride 100 mL/hr at 07/09/18 0317  . piperacillin-tazobactam (ZOSYN)  IV 3.375 g (07/08/18 2101)   PRN Meds: diclofenac sodium, fentaNYL (SUBLIMAZE) injection, methocarbamol, ondansetron **OR** ondansetron (ZOFRAN) IV, oxyCODONE-acetaminophen **AND** oxyCODONE  ================================================= ================================================= Subjective:  Patient stated her pain today is better today than in the past two weeks.  Still having pain but it is improved.  Tried having some soup last night and had to stop halfway through due to nausea, but took a zofran and was able to finish her meal.  States the pain is about 5/10 now, whereas yesterday was 9/10.    Objective: Temp:  [98.2 F (36.8 C)-101.1 F (38.4 C)] 98.2  F (36.8 C) (09/15 2246) Pulse Rate:  [73-100] 73 (09/15 2246) Resp:  [12-26] 12 (09/15 2246) BP: (101-149)/(65-103) 101/68 (09/15 2246) SpO2:  [91 %-97 %] 94 % (09/15 2246) Weight:  [119.6 kg] 119.6 kg (09/15 1744) Intake/Output 09/15 0701 - 09/16 0700 In: 1468.5 [I.V.:1181.9; IV Piggyback:286.6] Out: -  Physical Exam:  Gen: NAD, alert, lying in bed.   CV: Regular rate and rhythm.  Normal S1-S2.   Radial pulses 2+ bilaterally. No bilateral lower extremity edema. Resp: Clear to auscultation bilaterally.  No wheezing, rales, abnormal lung sounds.  No increased work of breathing appreciated. Abd: diffusely tender to palpation.  moreso on left side than right side.   Psych: Cooperative with exam. Pleasant. Makes eye contact.   Laboratory: Recent Labs  Lab 07/03/18 0241 07/08/18 0910  WBC 11.4* 16.9*  HGB 11.0* 11.3*  HCT 37.7 39.9  PLT 347 406*   Recent Labs  Lab 07/03/18 0241 07/08/18 0910  NA 140 137  K 3.6 3.2*  CL 103 101  CO2 22 24  BUN <5* 7*  CREATININE 0.73 0.71  CALCIUM 8.9 8.6*  PROT 6.4* 6.8  BILITOT 0.6 0.9  ALKPHOS 87 89  ALT 14 12  AST 22 18  GLUCOSE 100* 138*    Imaging/Diagnostic Tests: Dg Chest 2 View  Result Date: 07/08/2018 CLINICAL DATA:  Crackles in lung, weakness. EXAM: CHEST - 2 VIEW COMPARISON:  Chest x-rays dated 07/03/2018 and 10/18/2017. Chest CT dated 02/09/2018. FINDINGS: Today's study is slightly hypoinspiratory with crowding of the perihilar and bibasilar bronchovascular markings. Chronic atelectasis/scarring in the LEFT mid lung region. No new airspace opacity to suggest a developing pneumonia. No pleural effusion or pneumothorax seen. Heart size and mediastinal contours are stable. No acute or suspicious osseous finding. IMPRESSION: Low lung volumes. No active cardiopulmonary disease. No evidence of pneumonia or pulmonary edema. Electronically Signed   By: Franki Cabot M.D.   On: 07/08/2018 09:55   Ct Abdomen Pelvis W  Contrast  Result Date: 07/08/2018 CLINICAL DATA:  Lower abdominal pain for 2 weeks, history of UTI EXAM: CT ABDOMEN AND PELVIS WITH CONTRAST TECHNIQUE: Multidetector CT imaging of the abdomen and pelvis was performed using the standard protocol following bolus administration of intravenous contrast. CONTRAST:  174mL OMNIPAQUE IOHEXOL 300 MG/ML  SOLN COMPARISON:  02/09/18 FINDINGS: Lower chest: No acute abnormality. Hepatobiliary: Gallbladder is been surgically removed. The liver demonstrates some patchy decreased attenuation within the right and left lobes as well as the caudate lobe. In retrospect this may have been present on the precontrast images of the prior exam. This persists somewhat on the delayed images although incompletely evaluated on this exam. Pancreas: Unremarkable. No pancreatic ductal dilatation or surrounding inflammatory changes. Spleen: Scattered small cysts are noted. These are stable from the prior exam. Adrenals/Urinary Tract: Adrenal glands are within normal limits. Kidneys are well visualized bilaterally. No renal calculi or obstructive changes are noted. Bladder is decompressed. Stomach/Bowel: There are changes consistent with sigmoid diverticulitis. Considerable pericolonic inflammatory changes are noted. Diffuse wall thickening is noted. No definitive perforation or abscess formation is seen. A few wide-mouth diverticula are noted involved in the inflammatory change. The more proximal colon is unremarkable. The appendix is not well visualized although no inflammatory changes are noted. The stomach is within normal limits. No small bowel abnormality is seen. Vascular/Lymphatic: Aortic atherosclerosis. No enlarged abdominal or pelvic lymph nodes. Reproductive: Status post hysterectomy. No adnexal masses. Other: No abdominal wall hernia or abnormality. No abdominopelvic ascites. Musculoskeletal: Postsurgical changes are noted in the lower lumbar spine. Degenerative changes are seen. No  acute bony abnormality is noted. IMPRESSION: Changes consistent with sigmoid diverticulitis. No definitive perforation or abscess formation is noted at this time. Areas of decreased attenuation within the liver in both the right and left lobes. This may be related to the timing of the contrast bolus and perfusion anomaly but it does persist in the right lobe on the delayed images. Nonemergent MRI when the patient's condition improves to allow for optimum imaging is recommended for further evaluation. Electronically Signed   By: Inez Catalina M.D.   On: 07/08/2018 11:58      Benay Pike, MD 07/09/2018, 6:36 AM PGY-1, [facility] Intern pager: [phone], text pages welcome

## 2018-07-10 DIAGNOSIS — R1030 Lower abdominal pain, unspecified: Secondary | ICD-10-CM

## 2018-07-10 LAB — PROTIME-INR
INR: 3.52
PROTHROMBIN TIME: 35 s — AB (ref 11.4–15.2)

## 2018-07-10 MED ORDER — CIPROFLOXACIN HCL 750 MG PO TABS: 750.0000 mg | ORAL_TABLET | Freq: Two times a day (BID) | ORAL | 0 refills | Status: AC

## 2018-07-10 MED ORDER — METRONIDAZOLE 500 MG PO TABS: 500.0000 mg | ORAL_TABLET | Freq: Three times a day (TID) | ORAL | 0 refills | Status: AC

## 2018-07-10 MED ORDER — LOSARTAN POTASSIUM 25 MG PO TABS: 25.0000 mg | ORAL_TABLET | Freq: Every day | ORAL | 0 refills | Status: AC

## 2018-07-10 MED ORDER — METOPROLOL SUCCINATE ER 25 MG PO TB24: 25.0000 mg | ORAL_TABLET | Freq: Every day | ORAL | 0 refills | Status: AC

## 2018-07-10 NOTE — Progress Notes (Signed)
Pt is discharged to go home with home health.  Discharge instructions and prescriptions information given.

## 2018-07-10 NOTE — Discharge Instructions (Addendum)
We decreased your losartan and metoprolol so that you could take it every day without worrying about low blood pressure.  Please take these medications every day as scheduled.     Information on my medicine - Coumadin   (Warfarin).   YOUR GOAL INR IS BETWEEN 2 AND 3.    This medication education was reviewed with me or my healthcare representative as part of my discharge preparation.  Why was Coumadin prescribed for you? Coumadin was prescribed for you because you have a blood clot or a medical condition that can cause an increased risk of forming blood clots. Blood clots can cause serious health problems by blocking the flow of blood to the heart, lung, or brain. Coumadin can prevent harmful blood clots from forming. As a reminder your indication for Coumadin is:   Pulmonary Embolism Treatment  What test will check on my response to Coumadin? While on Coumadin (warfarin) you will need to have an INR test regularly to ensure that your dose is keeping you in the desired range. The INR (international normalized ratio) number is calculated from the result of the laboratory test called prothrombin time (PT).  If an INR APPOINTMENT HAS NOT ALREADY BEEN MADE FOR YOU please schedule an appointment to have this lab work done by your health care provider within 7 days. Your INR goal is usually a number between:  2 to 3 or your provider may give you a more narrow range like 2-2.5.  Ask your health care provider during an office visit what your goal INR is.  What  do you need to  know  About  COUMADIN? Take Coumadin (warfarin) exactly as prescribed by your healthcare provider about the same time each day.  DO NOT stop taking without talking to the doctor who prescribed the medication.  Stopping without other blood clot prevention medication to take the place of Coumadin may increase your risk of developing a new clot or stroke.  Get refills before you run out.  What do you do if you miss a dose? If you  miss a dose, take it as soon as you remember on the same day then continue your regularly scheduled regimen the next day.  Do not take two doses of Coumadin at the same time.  Important Safety Information A possible side effect of Coumadin (Warfarin) is an increased risk of bleeding. You should call your healthcare provider right away if you experience any of the following: ? Bleeding from an injury or your nose that does not stop. ? Unusual colored urine (red or dark brown) or unusual colored stools (red or black). ? Unusual bruising for unknown reasons. ? A serious fall or if you hit your head (even if there is no bleeding).  Some foods or medicines interact with Coumadin (warfarin) and might alter your response to warfarin. To help avoid this: ? Eat a balanced diet, maintaining a consistent amount of Vitamin K. ? Notify your provider about major diet changes you plan to make. ? Avoid alcohol or limit your intake to 1 drink for women and 2 drinks for men per day. (1 drink is 5 oz. wine, 12 oz. beer, or 1.5 oz. liquor.)  Make sure that ANY health care provider who prescribes medication for you knows that you are taking Coumadin (warfarin).  Also make sure the healthcare provider who is monitoring your Coumadin knows when you have started a new medication including herbals and non-prescription products.  Coumadin (Warfarin)  Major Drug Interactions  Increased Warfarin Effect Decreased Warfarin Effect  Alcohol (large quantities) Antibiotics (esp. Septra/Bactrim, Flagyl, Cipro) Amiodarone (Cordarone) Aspirin (ASA) Cimetidine (Tagamet) Megestrol (Megace) NSAIDs (ibuprofen, naproxen, etc.) Piroxicam (Feldene) Propafenone (Rythmol SR) Propranolol (Inderal) Isoniazid (INH) Posaconazole (Noxafil) Barbiturates (Phenobarbital) Carbamazepine (Tegretol) Chlordiazepoxide (Librium) Cholestyramine (Questran) Griseofulvin Oral Contraceptives Rifampin Sucralfate (Carafate) Vitamin K    Coumadin (Warfarin) Major Herbal Interactions  Increased Warfarin Effect Decreased Warfarin Effect  Garlic Ginseng Ginkgo biloba Coenzyme Q10 Green tea St. Johns wort    Coumadin (Warfarin) FOOD Interactions  Eat a consistent number of servings per week of foods HIGH in Vitamin K (1 serving =  cup)  Collards (cooked, or boiled & drained) Kale (cooked, or boiled & drained) Mustard greens (cooked, or boiled & drained) Parsley *serving size only =  cup Spinach (cooked, or boiled & drained) Swiss chard (cooked, or boiled & drained) Turnip greens (cooked, or boiled & drained)  Eat a consistent number of servings per week of foods MEDIUM-HIGH in Vitamin K (1 serving = 1 cup)  Asparagus (cooked, or boiled & drained) Broccoli (cooked, boiled & drained, or raw & chopped) Brussel sprouts (cooked, or boiled & drained) *serving size only =  cup Lettuce, raw (green leaf, endive, romaine) Spinach, raw Turnip greens, raw & chopped   These websites have more information on Coumadin (warfarin):  FailFactory.se; VeganReport.com.au;   PLEASE SCHEDULE A FOLLOWUP APPOINTMENT WITH YOUR PRIMARY CARE PROVIDER.

## 2018-07-10 NOTE — Progress Notes (Signed)
Family Medicine Teaching Service Daily Progress Note Intern Pager: [phone]  Patient name: [name] Medical record number: [id number] Date of birth: 1956/06/01 Age: 62 y.o. Gender: female  Primary Care Provider: Esaw Grandchild, NP Consultants: none Code Status: DNR  Pt Overview and Major Events to Date:  Hospital Day 2 Admitted: 07/08/2018  Assessment and Plan: [name] a 62 y.o.femalepresenting with two weeks of progressively worsening abdominal pain secondary to diverticulitis. 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   Diverticulitis: patient had increased abdominal pain mostly localized to left lower quadrant. Last colonoscopy 2017 showed diverticulosis and repeat recommended in 5 years. Patient s/p hysterectomy 08/2008.  - d/c zosyn.  Begin cipro 750 BID and flagyl 500 TID (9/16 - 9/21) - advance to soft diet.   - Up with assistance -amCBC, BMP, routine vitals - Zofran 4mg  q6 PRN - Daily weights - Strict I&Os - Pain control with Percocet, Ibuprofen, Lidocaine patch, Voltaren Gel, Robaxin, and Lyrica.  D/c'd fentanyl PRN.   HTN: BPs on admission 119-149 / 73-80. On home Lasix 40 BID, Losartan 50, Metoprolol-XL 50mg . Patient reports taking above meds if her BP is above 120/80, otherwise will not due to concern for hypotension.  - Daily vitals checks -Decrease dosage of home medications toLosartan 25 PO daily and Metoprolol 25mg  daily as per patient she becomes hypotensive. - Holding home lasix  -Observe Blood Pressures on these meds before adding others.  Chronic Painwith sciatica 2/2bulging cervical and lumbar discs and OA, stable: at home takes Robaxin 500 q6 PRN Spasms, Voltaren gel, Meloxicam 7.5mg , Percocet and Oxycodone 10 q6 PRN,and Pregabalin 25 BID. Cr 0.71 on admission. Patient reports  that she is attempting to re-establish with a pain clinic. Patient with history of laminectomy in - Fentanyl 6mcg q2hrs PRN, Percocet 5q4 PRN, oxy 5q4 PRN Pregabalin 25 BID, Ibuprofen 600mg  4 times daily.  - Kpad PRN, Voltaren Gel PRN  Murmur: patient with murmur noted on exam that reports is not new. She has no recent ECHO with last ECHO in 2012. Patient also on Lasix prn for fluid.CXr with no pulmonary congestion noted. - ECHO: normal systolic function.  60-65% EF.    Recurrent UTI's: not believed to have UTI currently.  Urine culture unrevealing.    Hypokalemia: K 3.2 on admission. 3.6 this am. Patient on lasix at home.  - Holding lasix - Replete K as needed.   Anemia: Chronic. Stable. BL ~9.7 this am. Patient denies hematochezia or hematemesis.  - Monitor on daily CBC  COPD vs Asthma: only uses albuterol nebulizer at home.Patient with no recorded PFT's. She has only albuterol at home that she uses multiple times per week. May benefit from PFT's and controller medications. - PRN Albuterol.   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 Does not want a DOAC due to perceived side effects. . - Warfarin scheduled per pharmacy - INR - 3.54 --> 3.64 --> 3.52 - Attempt to adjust anticoagulation given poor compliance such as Xarelto  OSA: wears CPAP at night. Patient does not know settings. - CPAP  GERD: Prilosec 40mg  BID, Zofran 8mg  PRN,  - Pantoprazole 40mg  qDaily  HLD: Lipid panel 02/2018 shows Cholesterol 252, Trig  270, HDL 48, LDL 150; ASCVD Risk 7.5%. Takes Pravastatin 40mg  daily at home. Patient has poor medication compliance. - Pravastatin 40mg  qHS - Aspirin 81  Migraines: Sumatriptan nasal spray 5mg  totalas needed for migraines. -Sumatriptan if patient develops migraines  Anxiety:  Patient with h/o anxiety. She has previously on Zoloft and has required valium in previous hospital stays.  - Monitor - Consider restarting SSRI therapy  Decreased Attenuation of Liver on CT abdomen: May have been related to timing of contrast vs perfusion anomaly. Recommended further MRI imaging as outpatient, but patient with normal LFT's. May consider RUQ [place].  FEN/GI:Clear liquid Prophylaxis:warfarin per pharm  Disposition: home   Medications: Scheduled Meds: . aspirin EC  81 mg Oral Q1200  . ciprofloxacin  750 mg Oral BID  . feeding supplement  1 Container Oral TID BM  . ibuprofen  600 mg Oral QID  . lidocaine  1 patch Transdermal Q24H  . losartan  25 mg Oral Daily  . metoprolol succinate  25 mg Oral Daily  . metroNIDAZOLE  500 mg Oral Q8H  . multivitamin with minerals  1 tablet Oral Daily  . pantoprazole  20 mg Oral Daily  . pravastatin  40 mg Oral Daily  . pregabalin  25 mg Oral BID  . Warfarin - Pharmacist Dosing Inpatient   Does not apply q1800   Continuous Infusions: PRN Meds: diclofenac sodium, methocarbamol, ondansetron **OR** ondansetron (ZOFRAN) IV, oxyCODONE-acetaminophen **AND** oxyCODONE  ================================================= ================================================= Subjective:  Pain increased yesterday after the morning.  Was in pain all day and kept her up til 3am.  She started to feel better after passing gas. Tolerated breakfast and dinner clear liquids without nausea.   Objective: Temp:  [97.8 F (36.6 C)-98.4 F (36.9 C)] 97.8 F (36.6 C) (09/17 0525) Pulse Rate:  [68-79] 68 (09/17 0525) Resp:  [12-18] 16 (09/17 0525) BP: (101-119)/(51-68) 116/52 (09/17 0525) SpO2:  [93 %-99 %] 99 % (09/17 0525) Intake/Output 09/16 0701 - 09/17 0700 In: 1500 [P.O.:1500] Out: 2000 [Urine:2000]   Physical Exam:  Gen: mild distress, alert, non-toxic, well-nourished, well-appearing, HEENT: Normocephaic, atraumatic. Clear conjuctiva, no  scleral icterus and injection.  CV: heart sounds distant.  Difficult to auscultate.  Radial pulses 2+ bilaterally. Abd: present bowel sounds. Has mild tenderness in right side and LUQ and significant tenderness to palpation on LLQ.    Psych: Cooperative with exam. Pleasant. Makes eye contact.   Laboratory: Recent Labs  Lab 07/08/18 0910 07/09/18 0714  WBC 16.9* 6.9  HGB 11.3* 9.7*  HCT 39.9 34.2*  PLT 406* 365   Recent Labs  Lab 07/08/18 0910 07/09/18 0714  NA 137 142  K 3.2* 3.6  CL 101 106  CO2 24 28  BUN 7* 5*  CREATININE 0.71 0.67  CALCIUM 8.6* 8.3*  PROT 6.8  --   BILITOT 0.9  --   ALKPHOS 89  --   ALT 12  --   AST 18  --   GLUCOSE 138* 93    Imaging/Diagnostic Tests: Dg Chest 2 View  Result Date: 07/08/2018 CLINICAL DATA:  Crackles in lung, weakness. EXAM: CHEST - 2 VIEW COMPARISON:  Chest x-rays dated 07/03/2018 and 10/18/2017. Chest CT dated 02/09/2018. FINDINGS: Today's study is slightly hypoinspiratory with crowding of the perihilar and bibasilar bronchovascular markings. Chronic atelectasis/scarring in the LEFT mid lung region. No new airspace opacity to suggest a developing pneumonia. No pleural effusion or pneumothorax seen. Heart size and mediastinal contours are stable. No acute or suspicious osseous finding. IMPRESSION:  Low lung volumes. No active cardiopulmonary disease. No evidence of pneumonia or pulmonary edema. Electronically Signed   By: Franki Cabot M.D.   On: 07/08/2018 09:55   Ct Abdomen Pelvis W Contrast  Result Date: 07/08/2018 CLINICAL DATA:  Lower abdominal pain for 2 weeks, history of UTI EXAM: CT ABDOMEN AND PELVIS WITH CONTRAST TECHNIQUE: Multidetector CT imaging of the abdomen and pelvis was performed using the standard protocol following bolus administration of intravenous contrast. CONTRAST:  117mL OMNIPAQUE IOHEXOL 300 MG/ML  SOLN COMPARISON:  02/09/18 FINDINGS: Lower chest: No acute abnormality. Hepatobiliary: Gallbladder is been  surgically removed. The liver demonstrates some patchy decreased attenuation within the right and left lobes as well as the caudate lobe. In retrospect this may have been present on the precontrast images of the prior exam. This persists somewhat on the delayed images although incompletely evaluated on this exam. Pancreas: Unremarkable. No pancreatic ductal dilatation or surrounding inflammatory changes. Spleen: Scattered small cysts are noted. These are stable from the prior exam. Adrenals/Urinary Tract: Adrenal glands are within normal limits. Kidneys are well visualized bilaterally. No renal calculi or obstructive changes are noted. Bladder is decompressed. Stomach/Bowel: There are changes consistent with sigmoid diverticulitis. Considerable pericolonic inflammatory changes are noted. Diffuse wall thickening is noted. No definitive perforation or abscess formation is seen. A few wide-mouth diverticula are noted involved in the inflammatory change. The more proximal colon is unremarkable. The appendix is not well visualized although no inflammatory changes are noted. The stomach is within normal limits. No small bowel abnormality is seen. Vascular/Lymphatic: Aortic atherosclerosis. No enlarged abdominal or pelvic lymph nodes. Reproductive: Status post hysterectomy. No adnexal masses. Other: No abdominal wall hernia or abnormality. No abdominopelvic ascites. Musculoskeletal: Postsurgical changes are noted in the lower lumbar spine. Degenerative changes are seen. No acute bony abnormality is noted. IMPRESSION: Changes consistent with sigmoid diverticulitis. No definitive perforation or abscess formation is noted at this time. Areas of decreased attenuation within the liver in both the right and left lobes. This may be related to the timing of the contrast bolus and perfusion anomaly but it does persist in the right lobe on the delayed images. Nonemergent MRI when the patient's condition improves to allow for optimum  imaging is recommended for further evaluation. Electronically Signed   By: Inez Catalina M.D.   On: 07/08/2018 11:58      Benay Pike, MD 07/10/2018, 6:44 AM PGY-1, [place] Intern pager: [phone], text pages welcome

## 2018-07-10 NOTE — Discharge Summary (Signed)
[facility] Discharge Summary  Patient name: [name] Medical record number: [id number] Date of birth: 07-22-1956 Age: 62 y.o. Gender: female Date of Admission: 07/08/2018  Date of Discharge: 9/17 Admitting Physician: Lind Covert, MD  Primary Care Provider: Esaw Grandchild, NP Consultants: pharmacy  Indication for Hospitalization: abdominal pain  Discharge Diagnoses/Problem List:  Diverticulitis  HTN, chronic pain, murmur, anemia, Hx of PE, OSA, GERD, HLD, migraine, anxiety  Disposition: to home  Discharge Condition: improved  Discharge Exam:  Gen: mild distress, alert, non-toxic, well-nourished, well-appearing, HEENT: Normocephaic, atraumatic. Clear conjuctiva, no scleral icterus and injection.  CV: heart sounds distant.  Difficult to auscultate.  Radial pulses 2+ bilaterally. Abd: present bowel sounds. Has mild tenderness in right side and LUQ and moderate tenderness to palpation on LLQ.     Psych: Cooperative with exam. Pleasant. Makes eye contact.  Brief Hospital Course:  Patient admitted to hospital on afternoon of 9/15 following 2 weeks of worsening abdominal pain. CT scan performed in ED showed signs of diverticulitis. Started on zosyn in the ED, which was continued until patient transitioned to cipro and flagyl on 9/16. Patient started on clear liquid diet and advanced to soft diet, which she tolerated well.  INR was drawn and shown to be 3.6. Pharmacy was consulted to manage her warfarin, as pt reported not taking it for several days prior. Murmur was found on exam and echo was ordered, which showed 60-65% EF and grade 1 diastolic dysfunction. Patient's pain improved, she was transitioned off IV meds, and diet advanced. Patient then discharged on oral antibiotics.    Issues for Follow Up:  1. Abdominal pain - assess pt abdominal pain status. Was sent home with cipro/flagyl through 9/21 2. Warfarin use - pt states she monitors her  INR at home, was supratherapeutic on admission after not taking warfarin for several days.  3. HTN - pt has been avoiding taking daily meds if bp is below 120 at the time. Decreased dosages of pt home meds and informed her the need to take meds daily.   Significant Procedures: none  Significant Labs and Imaging:  Recent Labs  Lab 07/08/18 0910 07/09/18 0714  WBC 16.9* 6.9  HGB 11.3* 9.7*  HCT 39.9 34.2*  PLT 406* 365   Recent Labs  Lab 07/08/18 0910 07/09/18 0714  NA 137 142  K 3.2* 3.6  CL 101 106  CO2 24 28  GLUCOSE 138* 93  BUN 7* 5*  CREATININE 0.71 0.67  CALCIUM 8.6* 8.3*  ALKPHOS 89  --   AST 18  --   ALT 12  --   ALBUMIN 3.0*  --     Results/Tests Pending at Time of Discharge: none  Discharge Medications:  Allergies as of 07/10/2018      Reactions   Latex Other (See Comments)   Stings and burns me   Other Nausea And Vomiting, Other (See Comments)   Bleach - burns esophagus, breaks me out and burns me   Shellfish Allergy Hives, Nausea And Vomiting   Tape Other (See Comments)   Plastic tape irritates skin      Medication List    STOP taking these medications   amoxicillin-clavulanate 875-125 MG tablet Commonly known as:  AUGMENTIN   cefUROXime 250 MG tablet Commonly known as:  CEFTIN   oxyCODONE-acetaminophen 10-325 MG tablet Commonly known as:  PERCOCET   trimethoprim 100 MG tablet Commonly known as:  TRIMPEX     TAKE these medications  acetaminophen 325 MG tablet Commonly known as:  TYLENOL Take 650 mg by mouth every 6 (six) hours as needed.   albuterol 108 (90 Base) MCG/ACT inhaler Commonly known as:  PROVENTIL HFA;VENTOLIN HFA Inhale 2 puffs into the lungs every 6 (six) hours as needed. For wheezing   albuterol (2.5 MG/3ML) 0.083% nebulizer solution Commonly known as:  PROVENTIL Take 3 mLs (2.5 mg total) by nebulization every 6 (six) hours as needed for wheezing or shortness of breath.   ARTIFICIAL TEARS OP Place 1 drop into both  eyes daily as needed (dry eyes).   aspirin EC 81 MG tablet Take 81 mg by mouth daily at 12 noon.   ciprofloxacin 750 MG tablet Commonly known as:  CIPRO Take 1 tablet (750 mg total) by mouth 2 (two) times daily for 4 days.   diclofenac sodium 1 % Gel Commonly known as:  VOLTAREN Apply 2 g topically 4 (four) times daily as needed (For pain in shoulder, knees, back and neck.).   furosemide 40 MG tablet Commonly known as:  LASIX TAKE 1 TABLET (40 MG TOTAL) BY MOUTH 2 (TWO) TIMES DAILY AS NEEDED FOR FLUID. What changed:  See the new instructions.   lactulose 10 GM/15ML solution Commonly known as:  CHRONULAC Take 15 mLs (10 g total) by mouth 2 (two) times daily.   lidocaine 5 % Commonly known as:  LIDODERM Place 1-3 patches onto the skin as needed.   losartan 25 MG tablet Commonly known as:  COZAAR Take 1 tablet (25 mg total) by mouth daily. Start taking on:  07/11/2018 What changed:    medication strength  See the new instructions.   meloxicam 7.5 MG tablet Commonly known as:  MOBIC Take 1 tablet (7.5 mg total) by mouth daily.   methocarbamol 500 MG tablet Commonly known as:  ROBAXIN Take 1 tablet (500 mg total) by mouth every 6 (six) hours as needed for muscle spasms.   metoprolol succinate 25 MG 24 hr tablet Commonly known as:  TOPROL-XL Take 1 tablet (25 mg total) by mouth daily. Start taking on:  07/11/2018 What changed:    medication strength  how much to take  additional instructions   metroNIDAZOLE 500 MG tablet Commonly known as:  FLAGYL Take 1 tablet (500 mg total) by mouth every 8 (eight) hours for 4 days.   nitroGLYCERIN 0.4 MG SL tablet Commonly known as:  NITROSTAT Place 1 tablet (0.4 mg total) under the tongue every 5 (five) minutes as needed for chest pain.   NYAMYC powder Generic drug:  nystatin Apply 1 g topically daily as needed (rash).   omeprazole 40 MG capsule Commonly known as:  PRILOSEC TAKE 1 CAPSULE BY MOUTH TWICE A DAY What  changed:  when to take this   ondansetron 8 MG disintegrating tablet Commonly known as:  ZOFRAN-ODT 8mg  ODT q4 hours prn nausea What changed:    how much to take  how to take this  when to take this  reasons to take this   Oxycodone HCl 10 MG Tabs Take 1 tablet (10 mg total) by mouth every 6 (six) hours as needed (for severe pain).   pravastatin 40 MG tablet Commonly known as:  PRAVACHOL TAKE 1 TABLET BY MOUTH DAILY AT 12 NOON. What changed:    how much to take  how to take this  when to take this   pregabalin 25 MG capsule Commonly known as:  LYRICA Take 1 capsule (25 mg total) by mouth 2 (two) times  daily.   PRESCRIPTION MEDICATION CPAP   SUMAtriptan 5 MG/ACT nasal spray Commonly known as:  IMITREX Place 1 spray (5 mg total) into the nose every 2 (two) hours as needed for migraine. Max daily dose 2 sprays/day   Vitamin D (Ergocalciferol) 50000 units Caps capsule Commonly known as:  DRISDOL Take 50,000 Units by mouth every 7 (seven) days.   warfarin 5 MG tablet Commonly known as:  COUMADIN TAKE 1 TABLET ON MONDAY,WEDNESDAY,FRIDAY AND A 1/2 TABLET THE REST OF THE WEEK What changed:  See the new instructions.       Discharge Instructions: Please refer to Patient Instructions section of EMR for full details.  Patient was counseled important signs and symptoms that should prompt return to medical care, changes in medications, dietary instructions, activity restrictions, and follow up appointments.   Follow-Up Appointments: [place] Follow up.   Specialty:  Home Health Services Contact information: [address] [place] [address] [phone]           Benay Pike, MD 07/10/2018, 8:28 PM PGY-1, [place]

## 2018-07-10 NOTE — Progress Notes (Signed)
ANTICOAGULATION CONSULT NOTE - Follow Up Consult  Pharmacy Consult for Warfarin Indication: VTE prophylaxis and Hx of PE  Allergies  Allergen Reactions  . Latex Other (See Comments)    Stings and burns me  . Other Nausea And Vomiting and Other (See Comments)    Bleach - burns esophagus, breaks me out and burns me  . Shellfish Allergy Hives and Nausea And Vomiting  . Tape Other (See Comments)    Plastic tape irritates skin    Patient Measurements: Height: 5\' 3"  (160 cm) Weight: 265 lb 3.4 oz (120.3 kg) IBW/kg (Calculated) : 52.4  Vital Signs: Temp: 97.8 F (36.6 C) (09/17 0525) Temp Source: Oral (09/17 0525) BP: 154/59 (09/17 0952) Pulse Rate: 74 (09/17 0952)  Labs: Recent Labs    07/08/18 0910 07/08/18 1520 07/09/18 0714 07/10/18 0559  HGB 11.3*  --  9.7*  --   HCT 39.9  --  34.2*  --   PLT 406*  --  365  --   LABPROT  --  35.2* 35.9* 35.0*  INR  --  3.54 3.64 3.52  CREATININE 0.71  --  0.67  --     Estimated Creatinine Clearance: 91.6 mL/min (by C-G formula based on SCr of 0.67 mg/dL).  Assessment: 8 yof on warfarin PTA for hx of PE. Home regimen reported as 2.5 mg daily except 5 mg on Monday and Friday (of note, missed doses on 9/12+9/13, possibly 9/11; confirm last dose on 9/14).   INR today remains supra-therapeutic but down a bit from yesterday. Hgb down some 9/16 at 9.7. Platelets are within normal limits. No bleeding reported. Concurrent Cipro and Flagyl (started 9/16 and will end 9/21) will increase warfarin's effects.   Goal of Therapy:  INR 2-3 Monitor platelets by anticoagulation protocol: Yes   Plan:  Hold Warfarin again this PM.  Daily PT/INR.   Sherlon Handing, PharmD, BCPS Clinical pharmacist  **Pharmacist phone directory can now be found on [email or web address] ([email or web address]).  Listed under [place]. 07/10/2018,1:10 PM

## 2018-07-10 NOTE — Plan of Care (Signed)
  Problem: Nutrition: Goal: Adequate nutrition will be maintained Outcome: Progressing   

## 2018-07-10 NOTE — Care Management Note (Addendum)
Case Management Note  Patient Details  Name: [name] MRN: [id number] Date of Birth: [birth year]  Subjective/Objective:                    Action/Plan:  Prior to admission patient active with [facility] for PT/OT aide. Will need resumption of care orders.  Patient wants to keep HHPT/OT/aide through [facility]. Paged Family Medicine pager [phone] for orders.Family medicine returned call and will enter orders and face to face. Dorian Pod with [facility] aware. Expected Discharge Date:                  Expected Discharge Plan:  [place]  In-House Referral:     Discharge planning Services  CM Consult  Post Acute Care Choice:  Home Health Choice offered to:     DME Arranged:    DME Agency:     HH Arranged:    [place] Agency:     Status of Service:  In process, will continue to follow  If discussed at Long Length of Stay Meetings, dates discussed:    Additional Comments:  Marilu Favre, RN 07/10/2018, 2:36 PM

## 2018-07-12 DIAGNOSIS — M961 Postlaminectomy syndrome, not elsewhere classified: Secondary | ICD-10-CM | POA: Diagnosis not present

## 2018-07-12 DIAGNOSIS — I10 Essential (primary) hypertension: Secondary | ICD-10-CM | POA: Diagnosis not present

## 2018-07-12 DIAGNOSIS — M5441 Lumbago with sciatica, right side: Secondary | ICD-10-CM | POA: Diagnosis not present

## 2018-07-12 DIAGNOSIS — R69 Illness, unspecified: Secondary | ICD-10-CM | POA: Diagnosis not present

## 2018-07-12 DIAGNOSIS — J449 Chronic obstructive pulmonary disease, unspecified: Secondary | ICD-10-CM | POA: Diagnosis not present

## 2018-07-12 DIAGNOSIS — M5416 Radiculopathy, lumbar region: Secondary | ICD-10-CM | POA: Diagnosis not present

## 2018-07-12 DIAGNOSIS — M1712 Unilateral primary osteoarthritis, left knee: Secondary | ICD-10-CM | POA: Diagnosis not present

## 2018-07-12 DIAGNOSIS — G8929 Other chronic pain: Secondary | ICD-10-CM | POA: Diagnosis not present

## 2018-07-12 DIAGNOSIS — I2782 Chronic pulmonary embolism: Secondary | ICD-10-CM | POA: Diagnosis not present

## 2018-07-13 LAB — CULTURE, BLOOD (ROUTINE X 2)
CULTURE: NO GROWTH
Culture: NO GROWTH
SPECIAL REQUESTS: ADEQUATE
Special Requests: ADEQUATE

## 2018-07-14 ENCOUNTER — Other Ambulatory Visit: Payer: Self-pay | Admitting: Adult Health

## 2018-07-16 ENCOUNTER — Other Ambulatory Visit: Payer: Self-pay | Admitting: Adult Health

## 2018-07-16 ENCOUNTER — Telehealth: Payer: Self-pay | Admitting: Adult Health

## 2018-07-16 MED ORDER — WARFARIN SODIUM 5 MG PO TABS: 5.0000 mg | ORAL_TABLET | Freq: Every day | ORAL | 1 refills | Status: DC

## 2018-07-16 NOTE — Telephone Encounter (Signed)
Tillie Rung from [facility] @ [phone] requested verbal authorization for Physical Therapy & a Skilled Nursing evaluation.  --Forwarding message to medical assistant for review w/Provider & a response call to [facility].  --glh

## 2018-07-17 NOTE — Telephone Encounter (Signed)
LVM for [name] authorizing PT and skilled nursing evaluation.  Charyl Bigger, CMA

## 2018-07-18 DIAGNOSIS — R69 Illness, unspecified: Secondary | ICD-10-CM | POA: Diagnosis not present

## 2018-07-18 DIAGNOSIS — I2782 Chronic pulmonary embolism: Secondary | ICD-10-CM | POA: Diagnosis not present

## 2018-07-18 DIAGNOSIS — I10 Essential (primary) hypertension: Secondary | ICD-10-CM | POA: Diagnosis not present

## 2018-07-18 DIAGNOSIS — M5416 Radiculopathy, lumbar region: Secondary | ICD-10-CM | POA: Diagnosis not present

## 2018-07-18 DIAGNOSIS — G8929 Other chronic pain: Secondary | ICD-10-CM | POA: Diagnosis not present

## 2018-07-18 DIAGNOSIS — M1712 Unilateral primary osteoarthritis, left knee: Secondary | ICD-10-CM | POA: Diagnosis not present

## 2018-07-18 DIAGNOSIS — J449 Chronic obstructive pulmonary disease, unspecified: Secondary | ICD-10-CM | POA: Diagnosis not present

## 2018-07-18 DIAGNOSIS — M5441 Lumbago with sciatica, right side: Secondary | ICD-10-CM | POA: Diagnosis not present

## 2018-07-18 DIAGNOSIS — M961 Postlaminectomy syndrome, not elsewhere classified: Secondary | ICD-10-CM | POA: Diagnosis not present

## 2018-07-20 ENCOUNTER — Encounter: Payer: Self-pay | Admitting: Physical Medicine & Rehabilitation

## 2018-07-20 ENCOUNTER — Encounter: Payer: Medicare HMO | Attending: Physical Medicine & Rehabilitation

## 2018-07-20 ENCOUNTER — Ambulatory Visit: Payer: Medicare HMO | Admitting: Physical Medicine & Rehabilitation

## 2018-07-20 VITALS — BP 127/83 | HR 88 | Resp 14

## 2018-07-20 DIAGNOSIS — G4730 Sleep apnea, unspecified: Secondary | ICD-10-CM | POA: Insufficient documentation

## 2018-07-20 DIAGNOSIS — E7800 Pure hypercholesterolemia, unspecified: Secondary | ICD-10-CM | POA: Diagnosis not present

## 2018-07-20 DIAGNOSIS — G8921 Chronic pain due to trauma: Secondary | ICD-10-CM

## 2018-07-20 DIAGNOSIS — M1712 Unilateral primary osteoarthritis, left knee: Secondary | ICD-10-CM | POA: Insufficient documentation

## 2018-07-20 DIAGNOSIS — M48061 Spinal stenosis, lumbar region without neurogenic claudication: Secondary | ICD-10-CM | POA: Diagnosis not present

## 2018-07-20 DIAGNOSIS — K219 Gastro-esophageal reflux disease without esophagitis: Secondary | ICD-10-CM | POA: Diagnosis not present

## 2018-07-20 DIAGNOSIS — Z981 Arthrodesis status: Secondary | ICD-10-CM | POA: Diagnosis not present

## 2018-07-20 DIAGNOSIS — I10 Essential (primary) hypertension: Secondary | ICD-10-CM | POA: Diagnosis not present

## 2018-07-20 DIAGNOSIS — M542 Cervicalgia: Secondary | ICD-10-CM | POA: Diagnosis not present

## 2018-07-20 DIAGNOSIS — M47814 Spondylosis without myelopathy or radiculopathy, thoracic region: Secondary | ICD-10-CM | POA: Diagnosis not present

## 2018-07-20 DIAGNOSIS — J45909 Unspecified asthma, uncomplicated: Secondary | ICD-10-CM | POA: Diagnosis not present

## 2018-07-20 DIAGNOSIS — I252 Old myocardial infarction: Secondary | ICD-10-CM | POA: Insufficient documentation

## 2018-07-20 DIAGNOSIS — Z87440 Personal history of urinary (tract) infections: Secondary | ICD-10-CM | POA: Diagnosis not present

## 2018-07-20 DIAGNOSIS — G8929 Other chronic pain: Secondary | ICD-10-CM | POA: Diagnosis not present

## 2018-07-20 DIAGNOSIS — M24512 Contracture, left shoulder: Secondary | ICD-10-CM

## 2018-07-20 MED ORDER — ACETAMINOPHEN-CODEINE #3 300-30 MG PO TABS: 1.0000 | ORAL_TABLET | Freq: Three times a day (TID) | ORAL | 2 refills | Status: DC | PRN

## 2018-07-20 NOTE — Progress Notes (Addendum)
Shoulder injectionleft Glenohumeral  Indication:Left Shoulder pain not relieved by medication management and other conservative care.  Informed consent was obtained after describing risks and benefits of the procedure with the patient, this includes bleeding, bruising, infection and medication side effects. The patient wishes to proceed and has given written consent. Patient was placed in a seated position. TheLeft shoulder was marked and prepped with betadine in the subacromial area. A 25-gauge 1-1/2 inch needle was inserted into the subacromial area. After negative draw back for blood, a solution containing 1 mL of 6 mg per ML betamethasone and 4 mL of 1% lidocaine was injected. A band aid was applied. The patient tolerated the procedure well. Post procedure instructions were given.  Also discussed chronic pain medications she has had problems with low back as well as her shoulder pain.  We reviewed her urine drug screen performed last month which was appropriate.  Reviewed PMP aware website no red flags.  Multiple providers including neurosurgery and primary care prescribing opiates but for very short duration.  Low dose utilized. Will call in Tylenol 3 with codeine 1 p.o. 3 times daily #90 refills Will see me next month for Zilretta injection left knee

## 2018-07-20 NOTE — Patient Instructions (Addendum)
Pick up Tylenol with codeine at pharmacy

## 2018-07-25 NOTE — Progress Notes (Signed)
Subjective:    Patient ID: [name], female    DOB: 23-Nov-1955, 62 y.o.   MRN: [id number]  HPI:  [name] presents for hospital f/u: 07/08/18-07/10/18 Brief Hospital Course:  Patient admitted to hospital on afternoon of 9/15 following 2 weeks of worsening abdominal pain. CT scan performed in ED showed signs of diverticulitis. Started on zosyn in the ED, which was continued until patient transitioned to cipro and flagyl on 9/16. Patient started on clear liquid diet and advanced to soft diet, which she tolerated well.  INR was drawn and shown to be 3.6. Pharmacy was consulted to manage her warfarin, as pt reported not taking it for several days prior. Murmur was found on exam and echo was ordered, which showed 60-65% EF and grade 1 diastolic dysfunction. Patient's pain improved, she was transitioned off IV meds, and diet advanced. Patient then discharged on oral antibiotics.   Issues for Follow Up:  1. Abdominal pain - assess pt abdominal pain status. Was sent home with cipro/flagyl through 9/21 2. Warfarin use - pt states she monitors her INR at home, was supratherapeutic on admission after not taking warfarin for several days.  3. HTN - pt has been avoiding taking daily meds if bp is below 120 at the time. Decreased dosages of pt home meds and informed her the need to take meds daily.  07/08/18-Areas of decreased attenuation within the liver in both the right and left lobes. This may be related to the timing of the contrast bolus and perfusion anomaly but it does persist in the right lobe on the delayed images. Nonemergent MRI when the patient's condition improves to allow for optimum imaging is recommended for further Evaluation.  10/3/1 OV:  She has completed  Cipro/Fagly ABX course She continues to have dysuria and frequency- she has not called her Urologist UA checked in clinic today She will check INR today and call our clinic with result BP today in clinic 112/85, HR 97, she  denies CP/dyspnea/HA/dizziness/palpitations -Ordered MRI liver W to address abnormal CT during hospitalization, refer to GI as appropriate She reports taking all medications as directed, denies SE She denies unusual bleeding/bruising She reports pain is stable, she recently received L shoulder injection and has follow-up with Phys Med 24 Oct She reports drinking >50 oz water/day and has been trying to follow heart healthy diet She continues to abstain from tobacco/ETOH She has lost 9 lbs since last OV 06/29/18  Patient Care Team    Relationship Specialty Notifications Start End  Hannah [name] D, NP PCP - General Family Medicine  10/31/17   Hannah Prows, MD Consulting Physician Cardiology  08/26/15     Patient Active Problem List   Diagnosis Date Noted  . Hospital discharge follow-up 07/26/2018  . Abnormal urinalysis 07/26/2018  . Lower abdominal pain   . Diverticulitis 07/08/2018  . Deep venous thrombosis ([place]) 06/29/2018  . Urinary frequency 06/29/2018  . Acute bronchitis with COPD ([place]) 04/11/2018  . Chronic pain due to trauma 04/10/2018  . Osteoarthritis of left knee 04/10/2018  . 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 Lumbar post-laminectomy syndrome 04/10/2018  . Chest tightness 11/28/2017  . Anxiety 11/02/2017  . Healthcare maintenance 10/31/2017  . Chronic bilateral back pain 10/31/2017  . Pulmonary embolism ([place]) 10/31/2017  . COPD (chronic obstructive pulmonary disease) ([place]) 10/31/2017  . Sleep apnea 10/31/2017  . History of Coumadin therapy 10/31/2017  . Lumbar radiculopathy, chronic   . Essential hypertension   .  Lumbar back pain with radiculopathy affecting right lower extremity 06/05/2017  . Palliative care by specialist   . Right leg pain 06/02/2017  . Severe low back pain 06/02/2017  . Acute right-sided low back pain with sciatica   . Recurrent UTI 05/10/2016  . Urinary incontinence 12/30/2015  . Chronic pulmonary embolism ([place]) 04/17/2013  .  UTI (urinary tract infection) 04/17/2013  . Diverticulitis of sigmoid colon 04/16/2013  . Obesity, Class III, BMI 40-49.9 (morbid obesity) ([place]) 04/16/2013  . Chronic narcotic dependence ([place]) 04/16/2013  . Asthma 11/16/2011  . GERD (gastroesophageal reflux disease) 11/16/2011  . Hyperlipidemia 11/16/2011     Past Medical History:  Diagnosis Date  . Acid reflux   . Asthma   . Chronic pain   . COPD (chronic obstructive pulmonary disease) ([place])   . Diverticulitis   . Heart murmur   . Hiatal hernia   . High cholesterol   . History of UTI   . Hypertension   . Labral tear of shoulder    left  . Myocardial infarction ([place]) 2014  . Pneumonia 2014  . Pulmonary embolism ([place]) last 2014   x 3   . Ruptured disc, cervical    1 in neck, 1 in midback and 1 in low back, 6 bulging discs,  upper and lower back, 5 slipped discs all the way uop and down back and   . Sleep apnea    uses cpap, pt does not know settings     Past Surgical History:  Procedure Laterality Date  .  bone spurs removed from feet Bilateral   . ABDOMINAL HYSTERECTOMY    . BACK SURGERY     fusion x 2 lower back, laser surgery mid and back and neck also done  . BREAST BIOPSY Right   . CARPAL TUNNEL RELEASE Right   . CHOLECYSTECTOMY    . colon polyps    . COLONOSCOPY WITH PROPOFOL N/A 04/08/2016   Procedure: COLONOSCOPY WITH PROPOFOL;  Surgeon: Carol Ada, MD;  Location: [facility];  Service: Endoscopy;  Laterality: N/A;  . colonscopy     x 2, with polyps removed both times  . IR FL GUIDED LOC OF NEEDLE/CATH TIP FOR SPINAL INJECTION RT  06/04/2017  . JOINT REPLACEMENT    . REPLACEMENT TOTAL KNEE Right   . TUBAL LIGATION       Family History  Problem Relation Age of Onset  . Heart failure Mother   . COPD Mother   . Heart failure Father   . COPD Father   . Cancer Other   . Heart failure Other   . Breast cancer Maternal Aunt   . Breast cancer Paternal Aunt   . Breast cancer Cousin      Social  History   Substance and Sexual Activity  Drug Use No     Social History   Substance and Sexual Activity  Alcohol Use No     Social History   Tobacco Use  Smoking Status Never Smoker  Smokeless Tobacco Never Used     Outpatient Encounter Medications as of 07/26/2018  Medication Sig Note  . acetaminophen-codeine (TYLENOL #3) 300-30 MG tablet Take 1 tablet by mouth every 8 (eight) hours as needed for moderate pain.   Marland Kitchen albuterol (PROVENTIL HFA;VENTOLIN HFA) 108 (90 Base) MCG/ACT inhaler Inhale 2 puffs into the lungs every 6 (six) hours as needed. For wheezing   . albuterol (PROVENTIL) (2.5 MG/3ML) 0.083% nebulizer solution Take 3 mLs (2.5 mg total) by nebulization every  6 (six) hours as needed for wheezing or shortness of breath.   Marland Kitchen aspirin EC 81 MG tablet Take 81 mg by mouth daily at 12 noon.    . diclofenac sodium (VOLTAREN) 1 % GEL Apply 2 g topically 4 (four) times daily as needed (For pain in shoulder, knees, back and neck.).    Marland Kitchen furosemide (LASIX) 40 MG tablet TAKE 1 TABLET (40 MG TOTAL) BY MOUTH 2 (TWO) TIMES DAILY AS NEEDED FOR FLUID. (Patient taking differently: Take 20 mg by mouth daily. )   . Hypromellose (ARTIFICIAL TEARS OP) Place 1 drop into both eyes daily as needed (dry eyes).   Marland Kitchen lactulose (CHRONULAC) 10 GM/15ML solution Take 15 mLs (10 g total) by mouth 2 (two) times daily. 04/10/2018: Only if needed  . lidocaine (LIDODERM) 5 % Place 1-3 patches onto the skin as needed.    Marland Kitchen losartan (COZAAR) 25 MG tablet Take 1 tablet (25 mg total) by mouth daily.   . meloxicam (MOBIC) 7.5 MG tablet Take 1 tablet (7.5 mg total) by mouth daily.   . methocarbamol (ROBAXIN) 500 MG tablet Take 1 tablet (500 mg total) by mouth every 6 (six) hours as needed for muscle spasms.   . metoprolol succinate (TOPROL-XL) 25 MG 24 hr tablet Take 1 tablet (25 mg total) by mouth daily.   . nitroGLYCERIN (NITROSTAT) 0.4 MG SL tablet Place 1 tablet (0.4 mg total) under the tongue every 5 (five)  minutes as needed for chest pain.   Marland Kitchen nystatin [facility]) powder Apply 1 g topically daily as needed (rash).   Marland Kitchen omeprazole (PRILOSEC) 40 MG capsule TAKE 1 CAPSULE BY MOUTH TWICE A DAY (Patient taking differently: Take 40 mg by mouth daily. )   . ondansetron (ZOFRAN ODT) 8 MG disintegrating tablet 8mg  ODT q4 hours prn nausea (Patient taking differently: Take 8 mg by mouth every 8 (eight) hours as needed for nausea or vomiting. 8mg  ODT q4 hours prn nausea)   . pravastatin (PRAVACHOL) 40 MG tablet TAKE 1 TABLET BY MOUTH DAILY AT 12 NOON. (Patient taking differently: Take 40 mg by mouth daily. TAKE 1 TABLET BY MOUTH DAILY AT 12 NOON.)   . pregabalin (LYRICA) 25 MG capsule Take 1 capsule (25 mg total) by mouth 2 (two) times daily.   Marland Kitchen PRESCRIPTION MEDICATION CPAP   . SUMAtriptan (IMITREX) 5 MG/ACT nasal spray Place 1 spray (5 mg total) into the nose every 2 (two) hours as needed for migraine. Max daily dose 2 sprays/day   . Vitamin Montoya, Ergocalciferol, (DRISDOL) 50000 units CAPS capsule Take 50,000 Units by mouth every 7 (seven) days. 06/02/2017: Fridays  . warfarin (COUMADIN) 5 MG tablet Take 1 tablet (5 mg total) by mouth daily at 6 PM. TAKE 5 mg TABLET ON MONDAY,FRIDAY AND 2.5 mg tues , thurs Saturday and Sunday Alternate every other Wednesday 5 mg and 2.5 mg   . amoxicillin-clavulanate (AUGMENTIN) 875-125 MG tablet Take 1 tablet by mouth 2 (two) times daily.   Marland Kitchen nystatin (MYCOSTATIN) 100000 UNIT/ML suspension Take 5 mLs (500,000 Units total) by mouth 4 (four) times daily.   . [DISCONTINUED] acetaminophen (TYLENOL) 325 MG tablet Take 650 mg by mouth every 6 (six) hours as needed.   . [DISCONTINUED] Oxycodone HCl 10 MG TABS Take 1 tablet (10 mg total) by mouth every 6 (six) hours as needed (for severe pain).    No facility-administered encounter medications on file as of 07/26/2018.     Allergies: Dexamethasone; Latex; Other; Shellfish allergy; and Tape  Body mass index is 45.1 kg/m.  Blood pressure  112/85, pulse 97, height 5\' 3"  (1.6 m), weight 254 lb 9.6 oz (115.5 kg), SpO2 95 %.  Review of Systems  Constitutional: Positive for fatigue. Negative for activity change, appetite change, chills, diaphoresis, fever and unexpected weight change.  Eyes: Negative for visual disturbance.  Respiratory: Negative for cough, chest tightness, shortness of breath, wheezing and stridor.   Cardiovascular: Negative for chest pain, palpitations and leg swelling.  Gastrointestinal: Positive for abdominal pain. Negative for abdominal distention, blood in stool, constipation, diarrhea, nausea and vomiting.  Genitourinary: Positive for dysuria, flank pain and urgency. Negative for difficulty urinating.  Musculoskeletal: Positive for arthralgias, back pain, gait problem, joint swelling, myalgias, neck pain and neck stiffness.  Neurological: Negative for dizziness and headaches.  Hematological: Does not bruise/bleed easily.  Psychiatric/Behavioral: Positive for sleep disturbance.       Objective:   Physical Exam  Constitutional: She is oriented to person, place, and time. She appears well-developed and well-nourished. No distress.  Pt is very well groomed, smiling and appears quite happy  HENT:  Head: Normocephalic and atraumatic.  Right Ear: External ear normal.  Left Ear: External ear normal.  Nose: Nose normal.  Mouth/Throat: Oropharynx is clear and moist.  Cardiovascular: Normal rate, regular rhythm, normal heart sounds and intact distal pulses.  No murmur heard. Pulmonary/Chest: Effort normal and breath sounds normal. No stridor. No respiratory distress. She has no wheezes. She has no rales. She exhibits no tenderness.  Abdominal: Soft. Bowel sounds are normal. She exhibits no distension and no mass. There is no tenderness. There is CVA tenderness. There is no rigidity, no rebound, no guarding, no tenderness at McBurney's point and negative Murphy's sign. No hernia.  Neurological: She is alert and  oriented to person, place, and time.  Skin: Skin is warm and dry. Capillary refill takes less than 2 seconds. No rash noted. She is not diaphoretic. No erythema. No pallor.  Psychiatric: She has a normal mood and affect. Her behavior is normal. Judgment and thought content normal.  Nursing note and vitals reviewed.     Assessment & Plan:   1. Dysuria   2. Urinary frequency   3. Generalized abdominal pain   4. Diverticulitis   5. Abnormal urinalysis   6. Hospital discharge follow-up   7. Essential hypertension     Hospital discharge follow-up 1) Check INR and call our clinic today with level 2) Call your Urologist and tell them that you are having continued urinary symptoms and you need to be seen. 3) You have been started on Augmentin to treat UTI, we will call you when urine culture/sensitivty results are available. 4) Nystatin swish/spit called in. 5) MRI of liver ordered to address abnormal CT during hospitalization. 6) Please continue with pain management as directed. 7) Continue with all medications as directed. 8) Please schedule complete physical in 3 months  Abnormal urinalysis UA Blo neg Nit pos Leu sma + CVA tenderness Just finished course of cipro/falgy Started on Augmentin Urine sent for C/S Instructed to be seen by her Urologist ASAP for recurrent UTI  Essential hypertension BP at goal 112/85, HR 97 She denies acute cardiac sx's   Pt was in the office today for 50+ minutes, I spent >75% of time  in face to face counseling of various medical concerns and in coordination of care FOLLOW-UP:  Return in about 3 months (around 10/26/2018) for CPE.

## 2018-07-26 ENCOUNTER — Telehealth: Payer: Self-pay

## 2018-07-26 ENCOUNTER — Ambulatory Visit (INDEPENDENT_AMBULATORY_CARE_PROVIDER_SITE_OTHER): Payer: Medicare HMO | Admitting: Adult Health

## 2018-07-26 ENCOUNTER — Encounter: Payer: Self-pay | Admitting: Adult Health

## 2018-07-26 VITALS — BP 112/85 | HR 97 | Ht 63.0 in | Wt 254.6 lb

## 2018-07-26 DIAGNOSIS — R1084 Generalized abdominal pain: Secondary | ICD-10-CM

## 2018-07-26 DIAGNOSIS — R350 Frequency of micturition: Secondary | ICD-10-CM | POA: Diagnosis not present

## 2018-07-26 DIAGNOSIS — I10 Essential (primary) hypertension: Secondary | ICD-10-CM

## 2018-07-26 DIAGNOSIS — R8290 Unspecified abnormal findings in urine: Secondary | ICD-10-CM | POA: Diagnosis not present

## 2018-07-26 DIAGNOSIS — R300 Dysuria: Secondary | ICD-10-CM

## 2018-07-26 DIAGNOSIS — K5792 Diverticulitis of intestine, part unspecified, without perforation or abscess without bleeding: Secondary | ICD-10-CM | POA: Diagnosis not present

## 2018-07-26 DIAGNOSIS — Z09 Encounter for follow-up examination after completed treatment for conditions other than malignant neoplasm: Secondary | ICD-10-CM | POA: Diagnosis not present

## 2018-07-26 LAB — POCT URINALYSIS DIPSTICK
Glucose, UA: NEGATIVE
Ketones, UA: 15
NITRITE UA: POSITIVE
PH UA: 6 (ref 5.0–8.0)
PROTEIN UA: POSITIVE — AB
RBC UA: NEGATIVE
Spec Grav, UA: >=1.03 — AB (ref 1.010–1.025)
UROBILINOGEN UA: 0.2 U/dL

## 2018-07-26 MED ORDER — AMOXICILLIN-POT CLAVULANATE 875-125 MG PO TABS: 1.0000 | ORAL_TABLET | Freq: Two times a day (BID) | ORAL | 0 refills | Status: DC

## 2018-07-26 MED ORDER — NYSTATIN 100000 UNIT/ML MT SUSP: 5.0000 mL | Freq: Four times a day (QID) | OROMUCOSAL | 0 refills | Status: DC

## 2018-07-26 NOTE — Assessment & Plan Note (Signed)
1) Check INR and call our clinic today with level 2) Call your Urologist and tell them that you are having continued urinary symptoms and you need to be seen. 3) You have been started on Augmentin to treat UTI, we will call you when urine culture/sensitivty results are available. 4) Nystatin swish/spit called in. 5) MRI of liver ordered to address abnormal CT during hospitalization. 6) Please continue with pain management as directed. 7) Continue with all medications as directed. 8) Please schedule complete physical in 3 months

## 2018-07-26 NOTE — Telephone Encounter (Signed)
Pt called stating that her INR today is 2.9.  Please advise.  Charyl Bigger, CMA

## 2018-07-26 NOTE — Patient Instructions (Signed)
Acute Urinary Retention, Female Urinary retention means you are unable to pee completely or at all (empty your bladder). Follow these instructions at home:  Drink enough fluids to keep your pee (urine) clear or pale yellow.  If you are sent home with a tube that drains the bladder (catheter), there will be a drainage bag attached to it. There are two types of bags. One is big that you can wear at night without having to empty it. One is smaller and needs to be emptied more often. ? Keep the drainage bag emptied. ? Keep the drainage bag lower than the tube.  Only take medicine as told by your doctor. Contact a doctor if:  You have a low-grade fever.  You have spasms or you are leaking pee when you have spasms. Get help right away if:  You have chills or a fever.  Your catheter stops draining pee.  Your catheter falls out.  You have increased bleeding that does not stop after you have rested and increased the amount of fluids you had been drinking. This information is not intended to replace advice given to you by your health care provider. Make sure you discuss any questions you have with your health care provider. Document Released: 03/28/2008 Document Revised: 03/17/2016 Document Reviewed: 03/21/2013 Elsevier Interactive Patient Education  2017 [place].  1) Check INR and call our clinic today with level 2) Call your Urologist and tell them that you are having continued urinary symptoms and you need to be seen. 3) You have been started on Augmentin to treat UTI, we will call you when urine culture/sensitivty results are available. 4) Nystatin swish/spit called in. 5) MRI of liver ordered to address abnormal CT during hospitalization. 6) Please continue with pain management as directed. 7) Continue with all medications as directed. 8) Please schedule complete physical in 3 months NICE TO SEE YOU!

## 2018-07-26 NOTE — Assessment & Plan Note (Signed)
BP at goal 112/85, HR 97 She denies acute cardiac sx's

## 2018-07-26 NOTE — Assessment & Plan Note (Signed)
UA Blo neg Nit pos Leu sma + CVA tenderness Just finished course of cipro/falgy Started on Augmentin Urine sent for C/S Instructed to be seen by her Urologist ASAP for recurrent UTI

## 2018-07-26 NOTE — Telephone Encounter (Signed)
Good Evening [name], Please tell [name] that her INR is therapeutic- continue current warfarin regime. Please have her check INR every 2 weeks and call us with results so we can monitor and adjust warfarin as necessary. Please also tell her to call us if she ever notices any unusual bleeding or bruising. Thanks! Valetta Fuller

## 2018-07-27 LAB — COMPREHENSIVE METABOLIC PANEL
A/G RATIO: 1.5 (ref 1.2–2.2)
ALK PHOS: 108 IU/L (ref 39–117)
ALT: 17 IU/L (ref 0–32)
AST: 20 IU/L (ref 0–40)
Albumin: 4.3 g/dL (ref 3.6–4.8)
BILIRUBIN TOTAL: 0.4 mg/dL (ref 0.0–1.2)
BUN / CREAT RATIO: 14 (ref 12–28)
BUN: 12 mg/dL (ref 8–27)
CHLORIDE: 102 mmol/L (ref 96–106)
CO2: 18 mmol/L — ABNORMAL LOW (ref 20–29)
Calcium: 10 mg/dL (ref 8.7–10.3)
Creatinine, Ser: 0.84 mg/dL (ref 0.57–1.00)
GFR calc Af Amer: 86 mL/min/{1.73_m2} (ref >59)
GFR calc non Af Amer: 75 mL/min/{1.73_m2} (ref >59)
GLUCOSE: 161 mg/dL — AB (ref 65–99)
Globulin, Total: 2.9 g/dL (ref 1.5–4.5)
POTASSIUM: 4.3 mmol/L (ref 3.5–5.2)
Sodium: 139 mmol/L (ref 134–144)
Total Protein: 7.2 g/dL (ref 6.0–8.5)

## 2018-07-27 NOTE — Telephone Encounter (Signed)
VM box is full and cannot accept messages.  Charyl Bigger, CMA

## 2018-07-27 NOTE — Telephone Encounter (Signed)
Pt informed of results.  Pt expressed understanding and is agreeable.  T. Dannelle Rhymes, CMA 

## 2018-07-28 LAB — POCT INR: INR: 2.9 — AB (ref 0.9–1.1)

## 2018-07-29 LAB — CULTURE, URINE COMPREHENSIVE

## 2018-07-31 ENCOUNTER — Other Ambulatory Visit: Payer: Self-pay | Admitting: Family Medicine

## 2018-08-01 DIAGNOSIS — M5441 Lumbago with sciatica, right side: Secondary | ICD-10-CM | POA: Diagnosis not present

## 2018-08-01 DIAGNOSIS — I2782 Chronic pulmonary embolism: Secondary | ICD-10-CM | POA: Diagnosis not present

## 2018-08-01 DIAGNOSIS — J449 Chronic obstructive pulmonary disease, unspecified: Secondary | ICD-10-CM | POA: Diagnosis not present

## 2018-08-01 DIAGNOSIS — G8929 Other chronic pain: Secondary | ICD-10-CM | POA: Diagnosis not present

## 2018-08-01 DIAGNOSIS — M961 Postlaminectomy syndrome, not elsewhere classified: Secondary | ICD-10-CM | POA: Diagnosis not present

## 2018-08-01 DIAGNOSIS — M1712 Unilateral primary osteoarthritis, left knee: Secondary | ICD-10-CM | POA: Diagnosis not present

## 2018-08-01 DIAGNOSIS — I10 Essential (primary) hypertension: Secondary | ICD-10-CM | POA: Diagnosis not present

## 2018-08-01 DIAGNOSIS — M5416 Radiculopathy, lumbar region: Secondary | ICD-10-CM | POA: Diagnosis not present

## 2018-08-01 DIAGNOSIS — R69 Illness, unspecified: Secondary | ICD-10-CM | POA: Diagnosis not present

## 2018-08-04 ENCOUNTER — Ambulatory Visit
Admission: RE | Admit: 2018-08-04 | Discharge: 2018-08-04 | Disposition: A | Discharge status: Home / Self Care | Payer: Medicare HMO | Source: Ambulatory Visit | Attending: Adult Health | Admitting: Adult Health

## 2018-08-04 DIAGNOSIS — R1084 Generalized abdominal pain: Secondary | ICD-10-CM

## 2018-08-04 DIAGNOSIS — K5732 Diverticulitis of large intestine without perforation or abscess without bleeding: Secondary | ICD-10-CM | POA: Diagnosis not present

## 2018-08-04 DIAGNOSIS — R160 Hepatomegaly, not elsewhere classified: Secondary | ICD-10-CM | POA: Diagnosis not present

## 2018-08-04 MED ORDER — GADOBENATE DIMEGLUMINE 529 MG/ML IV SOLN
20.0000 mL | Freq: Once | INTRAVENOUS | Status: AC | PRN
  Administered 2018-08-04: 20 mL via INTRAVENOUS

## 2018-08-06 ENCOUNTER — Other Ambulatory Visit: Payer: Self-pay | Admitting: Adult Health

## 2018-08-06 DIAGNOSIS — R160 Hepatomegaly, not elsewhere classified: Secondary | ICD-10-CM

## 2018-08-06 DIAGNOSIS — K760 Fatty (change of) liver, not elsewhere classified: Secondary | ICD-10-CM

## 2018-08-06 LAB — ID AND SUSCEPTIBILITY, URINE

## 2018-08-06 LAB — ID 2

## 2018-08-06 LAB — SPECIMEN STATUS REPORT

## 2018-08-06 NOTE — Progress Notes (Signed)
GI referral placed

## 2018-08-07 ENCOUNTER — Other Ambulatory Visit: Payer: Self-pay | Admitting: Adult Health

## 2018-08-07 ENCOUNTER — Other Ambulatory Visit: Payer: Self-pay | Admitting: Family Medicine

## 2018-08-08 ENCOUNTER — Other Ambulatory Visit: Payer: Self-pay | Admitting: Adult Health

## 2018-08-08 ENCOUNTER — Other Ambulatory Visit: Payer: Self-pay | Admitting: Family Medicine

## 2018-08-08 ENCOUNTER — Telehealth: Payer: Self-pay | Admitting: Adult Health

## 2018-08-08 NOTE — Telephone Encounter (Signed)
Hailey from [facility] is requesting a call back for some med clarification for the patient. She can be reached at [phone]

## 2018-08-08 NOTE — Telephone Encounter (Signed)
Verified with [facility] pt's current medication list, dx for several medications and immunizations.  Charyl Bigger, CMA

## 2018-08-09 ENCOUNTER — Other Ambulatory Visit: Payer: Self-pay | Admitting: Family Medicine

## 2018-08-10 ENCOUNTER — Encounter: Payer: Self-pay | Admitting: Gastroenterology

## 2018-08-14 ENCOUNTER — Ambulatory Visit: Payer: Medicare HMO | Admitting: Gastroenterology

## 2018-08-16 ENCOUNTER — Ambulatory Visit: Payer: Medicare HMO | Admitting: Physical Medicine & Rehabilitation

## 2018-08-27 ENCOUNTER — Ambulatory Visit: Payer: Medicare HMO | Admitting: Adult Health

## 2018-08-28 ENCOUNTER — Ambulatory Visit: Payer: Medicare HMO | Admitting: Physical Medicine & Rehabilitation

## 2018-08-28 ENCOUNTER — Telehealth: Payer: Self-pay

## 2018-08-28 ENCOUNTER — Encounter: Payer: Medicare HMO | Attending: Physical Medicine & Rehabilitation

## 2018-08-28 ENCOUNTER — Other Ambulatory Visit: Payer: Self-pay

## 2018-08-28 VITALS — BP 127/72 | HR 84

## 2018-08-28 DIAGNOSIS — G4730 Sleep apnea, unspecified: Secondary | ICD-10-CM | POA: Insufficient documentation

## 2018-08-28 DIAGNOSIS — E7800 Pure hypercholesterolemia, unspecified: Secondary | ICD-10-CM | POA: Insufficient documentation

## 2018-08-28 DIAGNOSIS — I252 Old myocardial infarction: Secondary | ICD-10-CM | POA: Insufficient documentation

## 2018-08-28 DIAGNOSIS — J45909 Unspecified asthma, uncomplicated: Secondary | ICD-10-CM | POA: Diagnosis not present

## 2018-08-28 DIAGNOSIS — M48061 Spinal stenosis, lumbar region without neurogenic claudication: Secondary | ICD-10-CM | POA: Diagnosis not present

## 2018-08-28 DIAGNOSIS — M47814 Spondylosis without myelopathy or radiculopathy, thoracic region: Secondary | ICD-10-CM | POA: Insufficient documentation

## 2018-08-28 DIAGNOSIS — M1712 Unilateral primary osteoarthritis, left knee: Secondary | ICD-10-CM | POA: Insufficient documentation

## 2018-08-28 DIAGNOSIS — G8929 Other chronic pain: Secondary | ICD-10-CM | POA: Diagnosis not present

## 2018-08-28 DIAGNOSIS — I10 Essential (primary) hypertension: Secondary | ICD-10-CM | POA: Diagnosis not present

## 2018-08-28 DIAGNOSIS — M542 Cervicalgia: Secondary | ICD-10-CM | POA: Diagnosis not present

## 2018-08-28 DIAGNOSIS — Z87440 Personal history of urinary (tract) infections: Secondary | ICD-10-CM | POA: Diagnosis not present

## 2018-08-28 DIAGNOSIS — K219 Gastro-esophageal reflux disease without esophagitis: Secondary | ICD-10-CM | POA: Diagnosis not present

## 2018-08-28 DIAGNOSIS — Z981 Arthrodesis status: Secondary | ICD-10-CM | POA: Diagnosis not present

## 2018-08-28 MED ORDER — METHOCARBAMOL 500 MG PO TABS: 500.0000 mg | ORAL_TABLET | Freq: Four times a day (QID) | ORAL | 1 refills | Status: DC | PRN

## 2018-08-28 NOTE — Telephone Encounter (Signed)
Received fax from [facility] that pt has not repeated INR since 07/28/18.  Spoke with pt who states that she is only supposed to check her INRs once monthly.  Advised pt to please complete this home test and submit to [facility].  Pt expressed understanding and is agreeable.  Charyl Bigger, CMA

## 2018-08-28 NOTE — Progress Notes (Signed)
Knee injection   Indication:Left Knee pain not relieved by medication management and other conservative care.  Informed consent was obtained after describing risks and benefits of the procedure with the patient, this includes bleeding, bruising, infection and medication side effects. The patient wishes to proceed and has given written consent. The patient was placed in a recumbent position. The medial aspect of the knee was marked and prepped with Betadine and alcohol. It was then entered with a 25-gauge 1-1/2 inch needle and 1 mL of 1% lidocaine was injected into the skin and subcutaneous tissue. Then another 21g 2" needle was inserted into the knee joint. After negative draw back for blood, a solution containing 5 ML of Zilretta 32mg   injected. The patient tolerated the procedure well. Post procedure instructions were given.

## 2018-08-28 NOTE — Patient Instructions (Signed)
Knee Injection, Care After  Refer to this sheet in the next few weeks. These instructions provide you with information about caring for yourself after your procedure. Your health care provider may also give you more specific instructions. Your treatment has been planned according to current medical practices, but problems sometimes occur. Call your health care provider if you have any problems or questions after your procedure.  What can I expect after the procedure?  After the procedure, it is common to have:   Soreness.   Warmth.   Swelling.    You may have more pain, swelling, and warmth than you did before the injection. This reaction may last for about one day.  Follow these instructions at home:  Bathing   If you were given a bandage (dressing), keep it dry until your health care provider says it can be removed. Ask your health care provider when you can start showering or taking a bath.  Managing pain, stiffness, and swelling   If directed, apply ice to the injection area:  ? Put ice in a plastic bag.  ? Place a towel between your skin and the bag.  ? Leave the ice on for 20 minutes, 2-3 times per day.   Do not apply heat to your knee.   Raise the injection area above the level of your heart while you are sitting or lying down.  Activity   Avoid strenuous activities for as long as directed by your health care provider. Ask your health care provider when you can return to your normal activities.  General instructions   Take medicines only as directed by your health care provider.   Do not take aspirin or other over-the-counter medicines unless your health care provider says you can.   Check your injection site every day for signs of infection. Watch for:  ? Redness, swelling, or pain.  ? Fluid, blood, or pus.   Follow your health care provider's instructions about dressing changes and removal.  Contact a health care provider if:   You have symptoms at your injection site that last longer than  two days after your procedure.   You have redness, swelling, or pain in your injection area.   You have fluid, blood, or pus coming from your injection site.   You have warmth in your injection area.   You have a fever.   Your pain is not controlled with medicine.  Get help right away if:   Your knee turns very red.   Your knee becomes very swollen.   Your knee pain is severe.  This information is not intended to replace advice given to you by your health care provider. Make sure you discuss any questions you have with your health care provider.  Document Released: 10/31/2014 Document Revised: 06/15/2016 Document Reviewed: 08/20/2014  Elsevier Interactive Patient Education  2018 [employer]

## 2018-08-30 DIAGNOSIS — Z7901 Long term (current) use of anticoagulants: Secondary | ICD-10-CM | POA: Diagnosis not present

## 2018-08-30 LAB — POCT INR: INR: 2.6 — AB (ref 0.9–1.1)

## 2018-08-31 ENCOUNTER — Other Ambulatory Visit: Payer: Self-pay | Admitting: Adult Health

## 2018-09-04 ENCOUNTER — Telehealth: Payer: Self-pay

## 2018-09-04 NOTE — Telephone Encounter (Signed)
LVM informing pt that she is to continue her current dose of Coumadin and repeat in 1 month.  Charyl Bigger, CMA

## 2018-09-05 ENCOUNTER — Ambulatory Visit: Payer: Medicare HMO | Admitting: Adult Health

## 2018-09-10 ENCOUNTER — Telehealth: Payer: Self-pay | Admitting: *Deleted

## 2018-09-10 ENCOUNTER — Other Ambulatory Visit: Payer: Self-pay | Admitting: Adult Health

## 2018-09-10 MED ORDER — METHOCARBAMOL 500 MG PO TABS: 500.0000 mg | ORAL_TABLET | Freq: Four times a day (QID) | ORAL | 1 refills | Status: DC | PRN

## 2018-09-10 NOTE — Telephone Encounter (Signed)
Mrs [name] reports her pharmacy says it does not have refill on methocarbamol that Dr Letta Pate was supposed to send in.  She is asking for refill.  It looks like with was on phone in when Dr Letta Pate placed the order and it dit not get transmitted to pharmacy.  I have resent the order.Mrs [name] notified.

## 2018-09-13 ENCOUNTER — Ambulatory Visit: Payer: Medicare HMO | Admitting: Gastroenterology

## 2018-09-13 ENCOUNTER — Encounter: Payer: Self-pay | Admitting: Gastroenterology

## 2018-09-13 ENCOUNTER — Other Ambulatory Visit (INDEPENDENT_AMBULATORY_CARE_PROVIDER_SITE_OTHER): Payer: Medicare HMO

## 2018-09-13 VITALS — BP 138/80 | HR 76 | Ht 63.0 in | Wt 251.0 lb

## 2018-09-13 DIAGNOSIS — K5792 Diverticulitis of intestine, part unspecified, without perforation or abscess without bleeding: Secondary | ICD-10-CM

## 2018-09-13 DIAGNOSIS — D649 Anemia, unspecified: Secondary | ICD-10-CM

## 2018-09-13 DIAGNOSIS — M549 Dorsalgia, unspecified: Secondary | ICD-10-CM | POA: Diagnosis not present

## 2018-09-13 DIAGNOSIS — R109 Unspecified abdominal pain: Secondary | ICD-10-CM

## 2018-09-13 DIAGNOSIS — K760 Fatty (change of) liver, not elsewhere classified: Secondary | ICD-10-CM | POA: Diagnosis not present

## 2018-09-13 DIAGNOSIS — K219 Gastro-esophageal reflux disease without esophagitis: Secondary | ICD-10-CM | POA: Diagnosis not present

## 2018-09-13 LAB — CBC WITH DIFFERENTIAL/PLATELET
BASOS PCT: 3.7 % — AB (ref 0.0–3.0)
Basophils Absolute: 0.5 10*3/uL — ABNORMAL HIGH (ref 0.0–0.1)
EOS ABS: 0.1 10*3/uL (ref 0.0–0.7)
Eosinophils Relative: 0.8 % (ref 0.0–5.0)
HEMATOCRIT: 40.6 % (ref 36.0–46.0)
Hemoglobin: 12.8 g/dL (ref 12.0–15.0)
LYMPHS ABS: 3.6 10*3/uL (ref 0.7–4.0)
Lymphocytes Relative: 26.1 % (ref 12.0–46.0)
MCHC: 31.4 g/dL (ref 30.0–36.0)
MCV: 70.5 fl — ABNORMAL LOW (ref 78.0–100.0)
MONO ABS: 0.9 10*3/uL (ref 0.1–1.0)
Monocytes Relative: 6.5 % (ref 3.0–12.0)
Neutro Abs: 8.6 10*3/uL — ABNORMAL HIGH (ref 1.4–7.7)
Neutrophils Relative %: 62.9 % (ref 43.0–77.0)
PLATELETS: 450 10*3/uL — AB (ref 150.0–400.0)
RBC: 5.77 Mil/uL — ABNORMAL HIGH (ref 3.87–5.11)
RDW: 20.1 % — ABNORMAL HIGH (ref 11.5–15.5)
WBC: 13.7 10*3/uL — ABNORMAL HIGH (ref 4.0–10.5)

## 2018-09-13 LAB — FERRITIN: Ferritin: 9.2 ng/mL — ABNORMAL LOW (ref 10.0–291.0)
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
## 2018-09-13 NOTE — Patient Instructions (Signed)
If you are age 62 or older, your body mass index should be between 23-30. Your Body mass index is 44.46 kg/m. If this is out of the aforementioned range listed, please consider follow up with your Primary Care Provider.  If you are age 38 or younger, your body mass index should be between 19-25. Your Body mass index is 44.46 kg/m. If this is out of the aformentioned range listed, please consider follow up with your Primary Care Provider.   Please go to the lab in the basement of our building to have lab work done as you leave today. Hit "B" for basement when you get on the elevator.  When the doors open the lab is on your left.  We will call you with the results. Thank you.  Take Citrucel daily.  Please work on weight loss.  You will be due for a recall colonoscopy in 03-2021. We will send you a reminder in the mail when it gets closer to that time.  Thank you for entrusting me with your care and for choosing [facility], Dr. [place] Cellar

## 2018-09-14 LAB — IRON AND TIBC
Iron Saturation: 7 % — CL (ref 15–55)
Iron: 30 ug/dL (ref 27–139)
Total Iron Binding Capacity: 410 ug/dL (ref 250–450)
UIBC: 380 ug/dL — ABNORMAL HIGH (ref 118–369)

## 2018-09-14 LAB — HEPATITIS B SURFACE ANTIBODY,QUALITATIVE: Hep B S Ab: NONREACTIVE

## 2018-09-14 LAB — HEPATITIS A ANTIBODY, TOTAL: HEPATITIS A AB,TOTAL: NONREACTIVE

## 2018-09-14 LAB — HEPATITIS C ANTIBODY
Hepatitis C Ab: NONREACTIVE
SIGNAL TO CUT-OFF: 0.03 (ref <1.00)

## 2018-09-14 LAB — HEPATITIS B SURFACE ANTIGEN: HEP B S AG: NONREACTIVE

## 2018-09-27 ENCOUNTER — Other Ambulatory Visit: Payer: Self-pay

## 2018-09-27 MED ORDER — FERROUS SULFATE 325 (65 FE) MG PO TABS: 325.0000 mg | ORAL_TABLET | Freq: Two times a day (BID) | ORAL | 3 refills | Status: DC

## 2018-09-28 DIAGNOSIS — Z7901 Long term (current) use of anticoagulants: Secondary | ICD-10-CM | POA: Diagnosis not present

## 2018-09-28 DIAGNOSIS — I2699 Other pulmonary embolism without acute cor pulmonale: Secondary | ICD-10-CM | POA: Diagnosis not present

## 2018-09-28 LAB — POCT INR: INR: 3 — AB (ref 0.9–1.1)

## 2018-10-02 ENCOUNTER — Ambulatory Visit (AMBULATORY_SURGERY_CENTER): Payer: Self-pay

## 2018-10-02 ENCOUNTER — Telehealth: Payer: Self-pay

## 2018-10-02 ENCOUNTER — Encounter: Payer: Self-pay | Admitting: Gastroenterology

## 2018-10-02 VITALS — Ht 63.0 in | Wt 256.0 lb

## 2018-10-02 DIAGNOSIS — K219 Gastro-esophageal reflux disease without esophagitis: Secondary | ICD-10-CM

## 2018-10-02 NOTE — Progress Notes (Signed)
Denies allergies to eggs or soy products. Denies complication of anesthesia or sedation. Denies use of weight loss medication. Denies use of O2.   Emmi instructions declined.  

## 2018-10-02 NOTE — Telephone Encounter (Signed)
Received fax from [facility].  Pt's INR results for 09/28/18 were 3.0.  Per [facility], no changes in current Coumadin dosing.  LVM for pt to call to discuss.  Charyl Bigger, CMA

## 2018-10-03 NOTE — Telephone Encounter (Signed)
MyChart message sent to pt with results and Coumadin dosage.  Charyl Bigger, CMA

## 2018-10-05 ENCOUNTER — Other Ambulatory Visit: Payer: Self-pay | Admitting: Adult Health

## 2018-10-08 ENCOUNTER — Ambulatory Visit (AMBULATORY_SURGERY_CENTER): Payer: Medicare HMO | Admitting: Gastroenterology

## 2018-10-08 ENCOUNTER — Encounter: Payer: Self-pay | Admitting: Gastroenterology

## 2018-10-08 VITALS — BP 136/69 | HR 68 | Temp 98.6°F | Resp 18 | Ht 63.0 in | Wt 251.0 lb

## 2018-10-08 DIAGNOSIS — E611 Iron deficiency: Secondary | ICD-10-CM

## 2018-10-08 DIAGNOSIS — K3189 Other diseases of stomach and duodenum: Secondary | ICD-10-CM

## 2018-10-08 DIAGNOSIS — K317 Polyp of stomach and duodenum: Secondary | ICD-10-CM | POA: Diagnosis not present

## 2018-10-08 DIAGNOSIS — K219 Gastro-esophageal reflux disease without esophagitis: Secondary | ICD-10-CM | POA: Diagnosis not present

## 2018-10-08 DIAGNOSIS — K449 Diaphragmatic hernia without obstruction or gangrene: Secondary | ICD-10-CM

## 2018-10-08 MED ORDER — SODIUM CHLORIDE 0.9 % IV SOLN: 500.0000 mL | Freq: Once | INTRAVENOUS | Status: DC

## 2018-10-08 NOTE — Progress Notes (Signed)
Pt's states no medical or surgical changes since previsit or office visit. 

## 2018-10-08 NOTE — Progress Notes (Signed)
Called to room to assist during endoscopic procedure.  Patient ID and intended procedure confirmed with present staff. Received instructions for my participation in the procedure from the performing physician.  

## 2018-10-08 NOTE — Progress Notes (Signed)
A/ox3, pleased with MAC, report to RN 

## 2018-10-08 NOTE — Patient Instructions (Signed)
Discharge instructions given. Handout on a hiatal hernia. Biopsies taken. Resume previous medications. YOU HAD AN ENDOSCOPIC PROCEDURE TODAY AT [facility]:   Refer to the procedure report that was given to you for any specific questions about what was found during the examination.  If the procedure report does not answer your questions, please call your gastroenterologist to clarify.  If you requested that your care partner not be given the details of your procedure findings, then the procedure report has been included in a sealed envelope for you to review at your convenience later.  YOU SHOULD EXPECT: Some feelings of bloating in the abdomen. Passage of more gas than usual.  Walking can help get rid of the air that was put into your GI tract during the procedure and reduce the bloating. If you had a lower endoscopy (such as a colonoscopy or flexible sigmoidoscopy) you may notice spotting of blood in your stool or on the toilet paper. If you underwent a bowel prep for your procedure, you may not have a normal bowel movement for a few days.  Please Note:  You might notice some irritation and congestion in your nose or some drainage.  This is from the oxygen used during your procedure.  There is no need for concern and it should clear up in a day or so.  SYMPTOMS TO REPORT IMMEDIATELY:    Following upper endoscopy (EGD)  Vomiting of blood or coffee ground material  New chest pain or pain under the shoulder blades  Painful or persistently difficult swallowing  New shortness of breath  Fever of 100F or higher  Black, tarry-looking stools  For urgent or emergent issues, a gastroenterologist can be reached at any hour by calling [phone].   DIET:  We do recommend a small meal at first, but then you may proceed to your regular diet.  Drink plenty of fluids but you should avoid alcoholic beverages for 24 hours.  ACTIVITY:  You should plan to take it easy for the rest of  today and you should NOT DRIVE or use heavy machinery until tomorrow (because of the sedation medicines used during the test).    FOLLOW UP: Our staff will call the number listed on your records the next business day following your procedure to check on you and address any questions or concerns that you may have regarding the information given to you following your procedure. If we do not reach you, we will leave a message.  However, if you are feeling well and you are not experiencing any problems, there is no need to return our call.  We will assume that you have returned to your regular daily activities without incident.  If any biopsies were taken you will be contacted by phone or by letter within the next 1-3 weeks.  Please call us at [phone] if you have not heard about the biopsies in 3 weeks.    SIGNATURES/CONFIDENTIALITY: You and/or your care partner have signed paperwork which will be entered into your electronic medical record.  These signatures attest to the fact that that the information above on your After Visit Summary has been reviewed and is understood.  Full responsibility of the confidentiality of this discharge information lies with you and/or your care-partner.

## 2018-10-08 NOTE — Op Note (Signed)
[facility] Patient Name: [name] Procedure Date: 10/08/2018 11:04 AM MRN: [id number] Endoscopist: Remo Lipps P. Havery Moros , MD Age: 62 Referring MD:  Date of Birth: 1956-06-27 Gender: Female Account #: [id number] Procedure:                Upper GI endoscopy Indications:              Abdominal pain in the left upper quadrant, Iron                            deficiency anemia, fairly recent colonoscopy                            without concerning pathology Medicines:                Monitored Anesthesia Care Procedure:                Pre-Anesthesia Assessment:                           - Prior to the procedure, a History and Physical                            was performed, and patient medications and                            allergies were reviewed. The patient's tolerance of                            previous anesthesia was also reviewed. The risks                            and benefits of the procedure and the sedation                            options and risks were discussed with the patient.                            All questions were answered, and informed consent                            was obtained. Prior Anticoagulants: The patient has                            taken Coumadin (warfarin), last dose was 1 day                            prior to procedure. ASA Grade Assessment: III - A                            patient with severe systemic disease. After                            reviewing the risks and benefits, the patient was  deemed in satisfactory condition to undergo the                            procedure.                           After obtaining informed consent, the endoscope was                            passed under direct vision. Throughout the                            procedure, the patient's blood pressure, pulse, and                            oxygen saturations were monitored continuously. The                     Endoscope was introduced through the mouth, and                            advanced to the second part of duodenum. The upper                            GI endoscopy was accomplished without difficulty.                            The patient tolerated the procedure well. Scope In: Scope Out: Findings:                 Esophagogastric landmarks were identified: the                            Z-line was found at 40 cm and the gastroesophageal                            junction was found at 40 cm from the incisors.                           A small sliding hiatal hernia was present (1-2cm).                           The exam of the esophagus was otherwise normal.                           Multiple sessile polyps were found in the gastric                            fundus and in the gastric body, some small, a few                            large > 1cm in size. Biopsies were taken with a                            cold forceps  for histology from the largest polyps                            - they were not removed given the patient's                            coumadin use.                           The exam of the stomach was otherwise normal.                           Biopsies were taken with a cold forceps in the                            gastric body and in the gastric antrum for                            Helicobacter pylori testing.                           The duodenal bulb and second portion of the                            duodenum were normal. Complications:            No immediate complications. Estimated blood loss:                            Minimal. Estimated Blood Loss:     Estimated blood loss was minimal. Impression:               - Esophagogastric landmarks identified.                           - Small hiatal hernia.                           - Normal esophagus                           - Multiple gastric polyps as outlined. Biopsied.                            - Normal stomach otherwise - biopsies taken to rule                            out H pylori                           - Normal duodenal bulb and second portion of the                            duodenum.                           No clear cause for iron deficiency on this  exam Recommendation:           - Patient has a contact number available for                            emergencies. The signs and symptoms of potential                            delayed complications were discussed with the                            patient. Return to normal activities tomorrow.                            Written discharge instructions were provided to the                            patient.                           - Resume previous diet.                           - Continue present medications including iron                           - Resume Coumadin                           - Await pathology results. Remo Lipps P. , MD 10/08/2018 11:27:31 AM This report has been signed electronically.

## 2018-10-09 ENCOUNTER — Encounter: Payer: Medicare HMO | Admitting: Registered Nurse

## 2018-10-09 ENCOUNTER — Telehealth: Payer: Self-pay

## 2018-10-09 NOTE — Telephone Encounter (Signed)
  Follow up Call-  Call back number 10/08/2018  Post procedure Call Back phone  # [phone]  Permission to leave phone message Yes  Some recent data might be hidden     Patient questions:  Do you have a fever, pain , or abdominal swelling? No. Pain Score  0 *  Have you tolerated food without any problems? Yes.    Have you been able to return to your normal activities? Yes.    Do you have any questions about your discharge instructions: Diet   No. Medications  No. Follow up visit  No.  Do you have questions or concerns about your Care? No.  Actions: * If pain score is 4 or above: No action needed, pain <4.

## 2018-10-11 ENCOUNTER — Encounter: Payer: Medicare HMO | Attending: Physical Medicine & Rehabilitation | Admitting: Registered Nurse

## 2018-10-11 ENCOUNTER — Encounter: Payer: Self-pay | Admitting: Registered Nurse

## 2018-10-11 VITALS — BP 125/88 | HR 92 | Ht 63.0 in | Wt 253.0 lb

## 2018-10-11 DIAGNOSIS — M47814 Spondylosis without myelopathy or radiculopathy, thoracic region: Secondary | ICD-10-CM | POA: Insufficient documentation

## 2018-10-11 DIAGNOSIS — M25511 Pain in right shoulder: Secondary | ICD-10-CM | POA: Diagnosis not present

## 2018-10-11 DIAGNOSIS — G8929 Other chronic pain: Secondary | ICD-10-CM | POA: Diagnosis not present

## 2018-10-11 DIAGNOSIS — J45909 Unspecified asthma, uncomplicated: Secondary | ICD-10-CM | POA: Insufficient documentation

## 2018-10-11 DIAGNOSIS — M1712 Unilateral primary osteoarthritis, left knee: Secondary | ICD-10-CM

## 2018-10-11 DIAGNOSIS — Z981 Arthrodesis status: Secondary | ICD-10-CM | POA: Insufficient documentation

## 2018-10-11 DIAGNOSIS — E7800 Pure hypercholesterolemia, unspecified: Secondary | ICD-10-CM | POA: Diagnosis not present

## 2018-10-11 DIAGNOSIS — M1711 Unilateral primary osteoarthritis, right knee: Secondary | ICD-10-CM

## 2018-10-11 DIAGNOSIS — M546 Pain in thoracic spine: Secondary | ICD-10-CM | POA: Diagnosis not present

## 2018-10-11 DIAGNOSIS — M24512 Contracture, left shoulder: Secondary | ICD-10-CM | POA: Diagnosis not present

## 2018-10-11 DIAGNOSIS — M48061 Spinal stenosis, lumbar region without neurogenic claudication: Secondary | ICD-10-CM | POA: Insufficient documentation

## 2018-10-11 DIAGNOSIS — I10 Essential (primary) hypertension: Secondary | ICD-10-CM | POA: Insufficient documentation

## 2018-10-11 DIAGNOSIS — K219 Gastro-esophageal reflux disease without esophagitis: Secondary | ICD-10-CM | POA: Diagnosis not present

## 2018-10-11 DIAGNOSIS — Z87440 Personal history of urinary (tract) infections: Secondary | ICD-10-CM | POA: Diagnosis not present

## 2018-10-11 DIAGNOSIS — M25512 Pain in left shoulder: Secondary | ICD-10-CM

## 2018-10-11 DIAGNOSIS — M5416 Radiculopathy, lumbar region: Secondary | ICD-10-CM

## 2018-10-11 DIAGNOSIS — G4730 Sleep apnea, unspecified: Secondary | ICD-10-CM | POA: Diagnosis not present

## 2018-10-11 DIAGNOSIS — M961 Postlaminectomy syndrome, not elsewhere classified: Secondary | ICD-10-CM | POA: Diagnosis not present

## 2018-10-11 DIAGNOSIS — M542 Cervicalgia: Secondary | ICD-10-CM | POA: Diagnosis not present

## 2018-10-11 DIAGNOSIS — I252 Old myocardial infarction: Secondary | ICD-10-CM | POA: Insufficient documentation

## 2018-10-11 MED ORDER — PREGABALIN 50 MG PO CAPS: 50.0000 mg | ORAL_CAPSULE | Freq: Two times a day (BID) | ORAL | 3 refills | Status: AC

## 2018-10-11 NOTE — Patient Instructions (Signed)
Increase Tylenol # 3 to 4 times a day as needed for pain  Call or send My Chart Message on Monday for Medication Evaluation   [phone]

## 2018-10-11 NOTE — Progress Notes (Signed)
Subjective:    Patient ID: [name], female    DOB: 08/01/1956, 62 y.o.   MRN: [id number]  HPI: [name] is a 62 y.o. female who returns for follow up appointment for chronic pain and medication refill. She states her  pain is located in her neck, bilateral shoulders,mid-lower back pain radiating into her lower extremities and bilateral knee pain L>R.  Also reports increase intensity of pain, her current medication regimen not managing her pain, she's only receiving 2-3  hours of relief. We will increase her T& C #3 to four times a day as needed for pain, she picked up her medication on 10/10/2018,  This was verified with [facility]. she verbalizes understanding. She was instructed to call office on Monday 10/15/2018, for medication management, she verbalizes understanding. Also experiencing increase intensity and frequency of nerve pain, Lyrica changed to 50 mg BID, she verbalizes understanding.  She rates her pain 8. Her  current exercise regime is walking and performing stretching exercises.  [name] Morphine equivalent is 13.50  MME.Last UDS was Performed on 04/10/2018, it was consistent.   Brother in room all questions answered.    Pain Inventory Average Pain 6 Pain Right Now 8 My pain is sharp, burning, dull, stabbing, tingling and aching  In the last 24 hours, has pain interfered with the following? General activity 10 Relation with others 10 Enjoyment of life 10 What TIME of day is your pain at its worst? . Sleep (in general) Poor  Pain is worse with: sitting and standing Pain improves with: rest, heat/ice, medication and injections Relief from Meds: 4  Mobility walk with assistance use a walker ability to climb steps?  no do you drive?  no  Function disabled: date disabled 90 I need assistance with the following:  dressing, bathing, meal prep, household duties and shopping  Neuro/Psych bladder control  problems weakness numbness tingling trouble walking spasms depression  Prior Studies Any changes since last visit?  no  Physicians involved in your care Any changes since last visit?  no   Family History  Problem Relation Age of Onset  . Heart failure Mother   . COPD Mother   . Heart failure Father   . COPD Father   . Cancer Other   . Heart failure Other   . Breast cancer Maternal Aunt   . Breast cancer Paternal Aunt   . Breast cancer Cousin   . Pancreatic cancer Paternal Uncle   . Colon cancer Maternal Grandfather   . Esophageal cancer Neg Hx   . Rectal cancer Neg Hx   . Stomach cancer Neg Hx    Social History   Socioeconomic History  . Marital status: Legally Separated    Spouse name: Not on file  . Number of children: Not on file  . Years of education: Not on file  . Highest education level: Not on file  Occupational History  . Not on file  Social Needs  . Financial resource strain: Not on file  . Food insecurity:    Worry: Not on file    Inability: Not on file  . Transportation needs:    Medical: Not on file    Non-medical: Not on file  Tobacco Use  . Smoking status: Never Smoker  . Smokeless tobacco: Never Used  Substance and Sexual Activity  . Alcohol use: No  . Drug use: No  . Sexual activity: Never  Lifestyle  . Physical activity:    Days per  week: Not on file    Minutes per session: Not on file  . Stress: Not on file  Relationships  . Social connections:    Talks on phone: Not on file    Gets together: Not on file    Attends religious service: Not on file    Active member of club or organization: Not on file    Attends meetings of clubs or organizations: Not on file    Relationship status: Not on file  Other Topics Concern  . Not on file  Social History Narrative  . Not on file   Past Surgical History:  Procedure Laterality Date  .  bone spurs removed from feet Bilateral   . ABDOMINAL HYSTERECTOMY    . BACK SURGERY     fusion x  2 lower back, laser surgery mid and back and neck also done  . BREAST BIOPSY Right   . CARPAL TUNNEL RELEASE Right   . CHOLECYSTECTOMY    . colon polyps    . COLONOSCOPY WITH PROPOFOL N/A 04/08/2016   Procedure: COLONOSCOPY WITH PROPOFOL;  Surgeon: Carol Ada, MD;  Location: [facility];  Service: Endoscopy;  Laterality: N/A;  . colonscopy     x 2, with polyps removed both times  . IR FL GUIDED LOC OF NEEDLE/CATH TIP FOR SPINAL INJECTION RT  06/04/2017  . JOINT REPLACEMENT    . REPLACEMENT TOTAL KNEE Right   . TUBAL LIGATION     Past Medical History:  Diagnosis Date  . Acid reflux   . Allergy   . Anemia   . Arthritis   . Asthma   . Cataract   . Chronic pain   . Clotting disorder ([place])   . COPD (chronic obstructive pulmonary disease) ([place])   . Depression   . Diverticulitis   . Heart murmur   . Hiatal hernia   . High cholesterol   . History of UTI   . Hx of diverticulitis of colon    06-2018  . Hypertension   . Labral tear of shoulder    left  . Myocardial infarction ([place]) 2014  . Neuromuscular disorder ([place])   . Pneumonia 2014  . Pulmonary embolism ([place]) last 2014   x 3   . Ruptured disc, cervical    1 in neck, 1 in midback and 1 in low back, 6 bulging discs,  upper and lower back, 5 slipped discs all the way uop and down back and   . Sleep apnea    uses cpap, pt does not know settings   There were no vitals taken for this visit.  Opioid Risk Score:   Fall Risk Score:  `1  Depression screen PHQ 2/9  Depression screen [facility] 2/9 08/28/2018 07/26/2018 06/29/2018 04/10/2018 03/07/2018 11/28/2017 10/31/2017  Decreased Interest 1 3 3 3  0 3 3  Down, Depressed, Hopeless 1 2 3 3  0 3 3  PHQ - 2 Score 2 5 6 6  0 6 6  Altered sleeping - 2 3 3  0 3 3  Tired, decreased energy - 2 3 2  0 3 3  Change in appetite - 2 3 3  0 2 3  Feeling bad or failure about yourself  - 0 0 1 0 0 2  Trouble concentrating - 0 1 0 0 0 0  Moving slowly or fidgety/restless - 0 1 0 0 0 3  Suicidal thoughts - 0  0 0 0 0 0  PHQ-9 Score - 11 17 15  0 14 20  Difficult  doing work/chores - Extremely dIfficult Extremely dIfficult Somewhat difficult - Extremely dIfficult Extremely dIfficult     Review of Systems  Constitutional: Positive for appetite change and diaphoresis.  HENT: Negative.   Eyes: Negative.   Respiratory: Negative.   Cardiovascular: Negative.   Gastrointestinal: Positive for abdominal pain and nausea.  Endocrine: Negative.   Genitourinary: Negative.   Musculoskeletal: Positive for arthralgias, back pain, gait problem, joint swelling and myalgias.  Skin: Negative.   Allergic/Immunologic: Negative.   Neurological: Positive for weakness and numbness.  Hematological: Negative.   Psychiatric/Behavioral: Positive for dysphoric mood.  All other systems reviewed and are negative.      Objective:   Physical Exam Constitutional:      Appearance: Normal appearance.  Neck:     Musculoskeletal: Normal range of motion and neck supple.     Comments: Cervical Paraspinal Tenderness: C-5-C-6 Cardiovascular:     Rate and Rhythm: Normal rate and regular rhythm.     Pulses: Normal pulses.     Heart sounds: Normal heart sounds.  Pulmonary:     Effort: Pulmonary effort is normal.     Breath sounds: Normal breath sounds.  Musculoskeletal:     Comments: Normal Muscle Bulk and Muscle Testing Reveals:  Upper Extremities: Right: Full ROM and Muscle Strength 5/5 Left Decreased ROM 30 Degrees and Muscle Strength 5/5 Left AC Joint Tenderness  Thoracic Hypersensitivity  Lumbar Paraspinal Tenderness: L-3- L-5 Lower Extremities: Right: Full ROM and Muscle Strength 5/5 Right Lower Extremity Flexion Produces Pain into Patella Left: Decreased ROM and Muscle Strength 5/5 Left Lower Extremity Flexion Produces Pain into Patella Arises from Table slowly using walker for support Antalgic Gait   Skin:    General: Skin is warm and dry.  Neurological:     Mental Status: She is alert and oriented to  person, place, and time.  Psychiatric:        Mood and Affect: Mood normal.        Behavior: Behavior normal.           Assessment & Plan:  1. Contracture of left shoulder joint/ Chronic Bilateral Shoulder Pain: Scheduled for Left Shoulder Injection with Dr. Letta Pate. Continue to monitor.  2. Lumbar Post-Laminectomy: Continue HEP as Tolerated. Continue to Monitor. 3. Lumbar Radiculitis: Refilled: Increased Lyrica to 50 mg BID.  4. Chronic Bilateral Thoracic Back Pain: Continue current medication regimen. Continue to Monitor.  5. OA of Bilateral  Knee: S/P Left Knee Injection, reports 3-4 days of relief.  6. Chronic Pain Syndrome: Increase Tylenol #3 to 4 times a day as needed for pain. Call office on 10/15/18 to evaluate medication change. She verbalizes understanding.   30  minutes of face to face patient care time was spent during this visit. All questions were encouraged and answered.  F/U in 75month

## 2018-10-31 ENCOUNTER — Ambulatory Visit (INDEPENDENT_AMBULATORY_CARE_PROVIDER_SITE_OTHER): Payer: Medicare HMO | Admitting: Adult Health

## 2018-10-31 ENCOUNTER — Encounter: Payer: Self-pay | Admitting: Adult Health

## 2018-10-31 VITALS — BP 145/84 | HR 88 | Temp 98.4°F | Ht 63.0 in | Wt 262.7 lb

## 2018-10-31 DIAGNOSIS — Z23 Encounter for immunization: Secondary | ICD-10-CM

## 2018-10-31 DIAGNOSIS — I252 Old myocardial infarction: Secondary | ICD-10-CM

## 2018-10-31 DIAGNOSIS — Z0000 Encounter for general adult medical examination without abnormal findings: Secondary | ICD-10-CM

## 2018-10-31 DIAGNOSIS — R739 Hyperglycemia, unspecified: Secondary | ICD-10-CM | POA: Diagnosis not present

## 2018-10-31 DIAGNOSIS — I10 Essential (primary) hypertension: Secondary | ICD-10-CM | POA: Diagnosis not present

## 2018-10-31 DIAGNOSIS — Z9229 Personal history of other drug therapy: Secondary | ICD-10-CM | POA: Diagnosis not present

## 2018-10-31 LAB — POCT INR: INR: 2.7 — AB (ref 0.9–1.1)

## 2018-10-31 MED ORDER — PRAVASTATIN SODIUM 80 MG PO TABS: ORAL_TABLET | ORAL | 3 refills | Status: AC

## 2018-10-31 MED ORDER — ZOSTER VAC RECOMB ADJUVANTED 50 MCG/0.5ML IM SUSR: 0.5000 mL | Freq: Once | INTRAMUSCULAR | 0 refills | Status: AC

## 2018-10-31 MED ORDER — ONDANSETRON 8 MG PO TBDP: ORAL_TABLET | ORAL | 0 refills | Status: AC

## 2018-10-31 MED ORDER — FUROSEMIDE 40 MG PO TABS: ORAL_TABLET | ORAL | 0 refills | Status: AC

## 2018-10-31 NOTE — Assessment & Plan Note (Signed)
Body mass index is 46.54 kg/m.  Current wt 262 Follow heart healthy diet and be as active as possible

## 2018-10-31 NOTE — Assessment & Plan Note (Addendum)
INR today per pt 2.7 Denies unusual bleeding/bruising Currently on Coumadin 5mg  M/W/F 5mg  1/2 tablet T/R/Sat/Sun Discussed what foods to avoid- foods high in vit k

## 2018-10-31 NOTE — Progress Notes (Signed)
Subjective:    Patient ID: [name], female    DOB: 1956-04-22, 63 y.o.   MRN: [id number]  HPI:  Ms. Stigler is here for CPE She reports medication compliance, denies SE She reports INR drawn at home was 2.7, she denies unusual bleeding/bruising She reports using PRN Furosemide 40mg  BID daily the last 2 weeks due to bil lower ext edema She denies increase in dyspnea She reports cancelling 3 cardiology appt's- due to transportation issues She reports chronic depression r/t poor health and inability to perform ADLs She denies thoughts of harming herself/others and states "I have suffered too much in this life to miss out on heaven and I would never take my life". She life with one of her adult children, however states "he can be a bit lazy around the house" She tries to drink plenty of water and eats a diet mostly of processed meats and vegetables She denies tobacco/vape/ETOH use She is followed by Phys Med for chronic pain issues- L shoulder, back, bil hips/kness Of Note- She declined to undress for examination  Healthcare Maintenance PAP-N/A, hysterectomy Mammogram-UTD, 01/08/18, last normal Immnunizations-Zoster ordered  LDCT-N/A, no smoking hx AAA Screening-N/A Colonoscopy-04/08/16, polyps removed recommend repeat 2022  Patient Care Team    Relationship Specialty Notifications Start End  Mina [name] D, NP PCP - General Family Medicine  10/31/17   Adrian Prows, MD Consulting Physician Cardiology  08/26/15     Patient Active Problem List   Diagnosis Date Noted  . Abnormal urinalysis 07/26/2018  . Lower abdominal pain   . Diverticulitis 07/08/2018  . Deep venous thrombosis ([place]) 06/29/2018  . Urinary frequency 06/29/2018  . Acute bronchitis with COPD ([place]) 04/11/2018  . Chronic pain due to trauma 04/10/2018  . Osteoarthritis of left knee 04/10/2018  . 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 Lumbar post-laminectomy syndrome 04/10/2018  . Anxiety  11/02/2017  . Healthcare maintenance 10/31/2017  . Chronic bilateral back pain 10/31/2017  . Pulmonary embolism ([place]) 10/31/2017  . COPD (chronic obstructive pulmonary disease) ([place]) 10/31/2017  . Sleep apnea 10/31/2017  . History of Coumadin therapy 10/31/2017  . Lumbar radiculopathy, chronic   . Essential hypertension   . Lumbar back pain with radiculopathy affecting right lower extremity 06/05/2017  . Palliative care by specialist   . Right leg pain 06/02/2017  . Severe low back pain 06/02/2017  . Acute right-sided low back pain with sciatica   . Recurrent UTI 05/10/2016  . Urinary incontinence 12/30/2015  . Chronic pulmonary embolism ([place]) 04/17/2013  . UTI (urinary tract infection) 04/17/2013  . Diverticulitis of sigmoid colon 04/16/2013  . Obesity, Class III, BMI 40-49.9 (morbid obesity) ([place]) 04/16/2013  . Chronic narcotic dependence ([place]) 04/16/2013  . Asthma 11/16/2011  . GERD (gastroesophageal reflux disease) 11/16/2011  . Hyperlipidemia 11/16/2011     Past Medical History:  Diagnosis Date  . Acid reflux   . Allergy   . Anemia   . Arthritis   . Asthma   . Cataract   . Chronic pain   . Clotting disorder ([place])   . Colon polyps   . COPD (chronic obstructive pulmonary disease) ([place])   . Depression   . Diverticulitis   . Heart murmur   . Hiatal hernia   . High cholesterol   . History of UTI   . Hx of diverticulitis of colon    06-2018  . Hypertension   . Labral tear of shoulder    left  . Myocardial  infarction ([place]) 2014  . Neuromuscular disorder ([place])   . Pneumonia 2014  . Pulmonary embolism ([place]) last 2014   x 3   . Ruptured disc, cervical    1 in neck, 1 in midback and 1 in low back, 6 bulging discs,  upper and lower back, 5 slipped discs all the way uop and down back and   . Sleep apnea    uses cpap, pt does not know settings     Past Surgical History:  Procedure Laterality Date  .  bone spurs removed from feet Bilateral   . ABDOMINAL  HYSTERECTOMY    . BACK SURGERY     fusion x 2 lower back, laser surgery mid and back and neck also done  . BREAST BIOPSY Right   . CARPAL TUNNEL RELEASE Right   . CHOLECYSTECTOMY    . colon polyps    . COLONOSCOPY WITH PROPOFOL N/A 04/08/2016   Procedure: COLONOSCOPY WITH PROPOFOL;  Surgeon: Carol Ada, MD;  Location: [facility];  Service: Endoscopy;  Laterality: N/A;  . colonscopy     x 2, with polyps removed both times  . IR FL GUIDED LOC OF NEEDLE/CATH TIP FOR SPINAL INJECTION RT  06/04/2017  . JOINT REPLACEMENT    . REPLACEMENT TOTAL KNEE Right   . TUBAL LIGATION       Family History  Problem Relation Age of Onset  . Heart failure Mother   . COPD Mother   . Heart failure Father   . COPD Father   . Cancer Other   . Heart failure Other   . Breast cancer Maternal Aunt   . Breast cancer Paternal Aunt   . Breast cancer Cousin   . Pancreatic cancer Paternal Uncle   . Colon cancer Maternal Grandfather   . Esophageal cancer Neg Hx   . Rectal cancer Neg Hx   . Stomach cancer Neg Hx      Social History   Substance and Sexual Activity  Drug Use No     Social History   Substance and Sexual Activity  Alcohol Use No     Social History   Tobacco Use  Smoking Status Never Smoker  Smokeless Tobacco Never Used     Outpatient Encounter Medications as of 10/31/2018  Medication Sig Note  . acetaminophen-codeine (TYLENOL #3) 300-30 MG tablet Take 1 tablet by mouth every 8 (eight) hours as needed for moderate pain. 10/11/2018: States was not told to bring in, last dose this AM  . albuterol (PROVENTIL HFA;VENTOLIN HFA) 108 (90 Base) MCG/ACT inhaler Inhale 2 puffs into the lungs every 6 (six) hours as needed. For wheezing   . albuterol (PROVENTIL) (2.5 MG/3ML) 0.083% nebulizer solution Take 3 mLs (2.5 mg total) by nebulization every 6 (six) hours as needed for wheezing or shortness of breath.   Marland Kitchen aspirin EC 81 MG tablet Take 81 mg by mouth daily at 12 noon.    . diclofenac  sodium (VOLTAREN) 1 % GEL Apply 2 g topically 4 (four) times daily as needed (For pain in shoulder, knees, back and neck.).    Marland Kitchen ferrous sulfate 325 (65 FE) MG tablet Take 1 tablet (325 mg total) by mouth 2 (two) times daily with a meal.   . furosemide (LASIX) 40 MG tablet TAKE 1 TABLET (40 MG TOTAL) BY MOUTH 2 (TWO) TIMES DAILY AS NEEDED FOR FLUID. Future refills will needs to come from Cardiology   . Hypromellose (ARTIFICIAL TEARS OP) Place 1 drop into both eyes  daily as needed (dry eyes).   Marland Kitchen lidocaine (LIDODERM) 5 % Place 1-3 patches onto the skin as needed.    Marland Kitchen losartan (COZAAR) 25 MG tablet Take 1 tablet (25 mg total) by mouth daily.   . methocarbamol (ROBAXIN) 500 MG tablet Take 1 tablet (500 mg total) by mouth every 6 (six) hours as needed for muscle spasms.   . metoprolol succinate (TOPROL-XL) 25 MG 24 hr tablet Take 1 tablet (25 mg total) by mouth daily.   . nitroGLYCERIN (NITROSTAT) 0.4 MG SL tablet Place 1 tablet (0.4 mg total) under the tongue every 5 (five) minutes as needed for chest pain.   Marland Kitchen nystatin [facility]) powder Apply 1 g topically daily as needed (rash).   Marland Kitchen omeprazole (PRILOSEC) 40 MG capsule TAKE 1 CAPSULE BY MOUTH TWICE A DAY   . ondansetron (ZOFRAN ODT) 8 MG disintegrating tablet 8mg  ODT q4 hours prn nausea   . pravastatin (PRAVACHOL) 80 MG tablet TAKE 1 TABLET BY MOUTH DAILY AT 12 NOON.   Marland Kitchen pregabalin (LYRICA) 50 MG capsule Take 1 capsule (50 mg total) by mouth 2 (two) times daily.   Marland Kitchen PRESCRIPTION MEDICATION CPAP   . SUMAtriptan (IMITREX) 5 MG/ACT nasal spray Place 1 spray (5 mg total) into the nose every 2 (two) hours as needed for migraine. Max daily dose 2 sprays/day   . Vitamin D, Ergocalciferol, (DRISDOL) 50000 units CAPS capsule Take 50,000 Units by mouth every 7 (seven) days. 06/02/2017: Fridays  . warfarin (COUMADIN) 5 MG tablet TAKE 1 TABLET BY MOUTH ON MONDAY,WEDNESDAY,FRIDAY AND A 1/2 TABLET THE REST OF THE WEEK   . [DISCONTINUED] furosemide (LASIX) 40 MG  tablet TAKE 1 TABLET (40 MG TOTAL) BY MOUTH 2 (TWO) TIMES DAILY AS NEEDED FOR FLUID. (Patient taking differently: Take 20 mg by mouth daily. )   . [DISCONTINUED] ondansetron (ZOFRAN ODT) 8 MG disintegrating tablet 8mg  ODT q4 hours prn nausea (Patient taking differently: Take 8 mg by mouth every 8 (eight) hours as needed for nausea or vomiting. 8mg  ODT q4 hours prn nausea)   . [DISCONTINUED] pravastatin (PRAVACHOL) 40 MG tablet TAKE 1 TABLET BY MOUTH DAILY AT 12 NOON. (Patient taking differently: Take 40 mg by mouth daily. TAKE 1 TABLET BY MOUTH DAILY AT 12 NOON.)   . Zoster Vaccine Adjuvanted [facility]) injection Inject 0.5 mLs into the muscle once for 1 dose.    No facility-administered encounter medications on file as of 10/31/2018.     Allergies: Dexamethasone; Latex; Other; Shellfish allergy; and Tape  Body mass index is 46.54 kg/m.  Blood pressure (!) 145/84, pulse 88, temperature 98.4 F (36.9 C), temperature source Oral, height 5\' 3"  (1.6 m), weight 262 lb 11.2 oz (119.2 kg), SpO2 95 %.  Review of Systems  Constitutional: Positive for activity change and fatigue. Negative for appetite change, chills, diaphoresis, fever and unexpected weight change.  Respiratory: Positive for shortness of breath. Negative for cough, chest tightness, wheezing and stridor.   Cardiovascular: Positive for leg swelling. Negative for chest pain and palpitations.  Gastrointestinal: Negative for abdominal distention, abdominal pain, blood in stool, constipation, diarrhea, nausea, rectal pain and vomiting.  Genitourinary: Negative for difficulty urinating and flank pain.  Musculoskeletal: Negative for arthralgias, back pain, gait problem, joint swelling, myalgias, neck pain and neck stiffness.  Skin: Negative for color change, pallor, rash and wound.  Neurological: Negative for dizziness and headaches.  Hematological: Does not bruise/bleed easily.  Psychiatric/Behavioral: Positive for dysphoric mood and sleep  disturbance. Negative for agitation, behavioral problems,  confusion, decreased concentration, hallucinations, self-injury and suicidal ideas. The patient is not nervous/anxious and is not hyperactive.        Objective:   Physical Exam Constitutional:      General: She is in acute distress.     Appearance: She is obese. She is not diaphoretic.  HENT:     Head: Normocephalic and atraumatic.  Cardiovascular:     Rate and Rhythm: Normal rate.     Pulses: Normal pulses.     Heart sounds: Normal heart sounds.  Pulmonary:     Effort: Pulmonary effort is normal. No respiratory distress.     Breath sounds: Normal breath sounds. No stridor. No wheezing, rhonchi or rales.  Chest:     Chest wall: No tenderness.  Abdominal:     General: Abdomen is protuberant. Bowel sounds are normal. There is no distension.     Palpations: Abdomen is soft. There is no mass.     Tenderness: There is no abdominal tenderness. There is no right CVA tenderness, left CVA tenderness, guarding or rebound.     Hernia: No hernia is present.     Comments: Unable to palpate abdominal organs due to habitus   Skin:    General: Skin is warm and dry.     Coloration: Skin is pale.     Comments: Baseline- pale   Neurological:     Mental Status: She is alert and oriented to person, place, and time.  Psychiatric:        Attention and Perception: Attention normal.        Mood and Affect: Affect is tearful.        Speech: Speech normal.        Behavior: Behavior normal.        Thought Content: Thought content normal.        Cognition and Memory: Cognition and memory normal.        Judgment: Judgment normal.       Assessment & Plan:   1. Need for zoster vaccination   2. Essential hypertension   3. Elevated blood sugar   4. History of MI (myocardial infarction)   5. Healthcare maintenance   6. History of Coumadin therapy   7. Morbid obesity ([place])   8. Obesity, Class III, BMI 40-49.9 (morbid obesity) ([place])      Healthcare maintenance Continue all medications as directed. Remain well hydrated and follow heart healthy diet. Remain as active as possible. New referral to cardiology placed, please request future cardiac medications from cardiology. Increased Pravastatin from 40mg  to 80mg  once daily to help reduce LDL (bad) cholesterol- will recheck cholesterol panel at follow-up. Continue with pain management as directed. Continue to check and report INR to use at least monthly, today was 2.7. Follow-up in 4 months for chronic medical conditions, please come fasting.  History of Coumadin therapy INR today per pt 2.7 Denies unusual bleeding/bruising Currently on Coumadin 5mg  M/W/F 5mg  1/2 tablet T/R/Sat/Sun Discussed what foods to avoid- foods high in vit k  Obesity, Class III, BMI 40-49.9 (morbid obesity) ([facility]) Body mass index is 46.54 kg/m.  Current wt 262 Follow heart healthy diet and be as active as possible    FOLLOW-UP:  Return in about 4 months (around 03/01/2019) for Regular Follow Up, HTN, Obesity, Fasting Labs, Anxiety.

## 2018-10-31 NOTE — Patient Instructions (Addendum)
Preventive Care for Adults, Female  A healthy lifestyle and preventive care can promote health and wellness. Preventive health guidelines for women include the following key practices.   A routine yearly physical is a good way to check with your health care provider about your health and preventive screening. It is a chance to share any concerns and updates on your health and to receive a thorough exam.   Visit your dentist for a routine exam and preventive care every 6 months. Brush your teeth twice a day and floss once a day. Good oral hygiene prevents tooth decay and gum disease.   The frequency of eye exams is based on your age, health, family medical history, use of contact lenses, and other factors. Follow your health care provider's recommendations for frequency of eye exams.   Eat a healthy diet. Foods like vegetables, fruits, whole grains, low-fat dairy products, and lean protein foods contain the nutrients you need without too many calories. Decrease your intake of foods high in solid fats, added sugars, and salt. Eat the right amount of calories for you.Get information about a proper diet from your health care provider, if necessary.   Regular physical exercise is one of the most important things you can do for your health. Most adults should get at least 150 minutes of moderate-intensity exercise (any activity that increases your heart rate and causes you to sweat) each week. In addition, most adults need muscle-strengthening exercises on 2 or more days a week.   Maintain a healthy weight. The body mass index (BMI) is a screening tool to identify possible weight problems. It provides an estimate of body fat based on height and weight. Your health care provider can find your BMI, and can help you achieve or maintain a healthy weight.For adults 20 years and older:   - A BMI below 18.5 is considered underweight.   - A BMI of 18.5 to 24.9 is normal.   - A BMI of 25 to 29.9 is  considered overweight.   - A BMI of 30 and above is considered obese.   Maintain normal blood lipids and cholesterol levels by exercising and minimizing your intake of trans and saturated fats.  Eat a balanced diet with plenty of fruit and vegetables. Blood tests for lipids and cholesterol should begin at age 75 and be repeated every 5 years minimum.  If your lipid or cholesterol levels are high, you are over 40, or you are at high risk for heart disease, you may need your cholesterol levels checked more frequently.Ongoing high lipid and cholesterol levels should be treated with medicines if diet and exercise are not working.   If you smoke, find out from your health care provider how to quit. If you do not use tobacco, do not start.   Lung cancer screening is recommended for adults aged 7-80 years who are at high risk for developing lung cancer because of a history of smoking. A yearly low-dose CT scan of the lungs is recommended for people who have at least a 30-pack-year history of smoking and are a current smoker or have quit within the past 15 years. A pack year of smoking is smoking an average of 1 pack of cigarettes a day for 1 year (for example: 1 pack a day for 30 years or 2 packs a day for 15 years). Yearly screening should continue until the smoker has stopped smoking for at least 15 years. Yearly screening should be stopped for people who develop a  health problem that would prevent them from having lung cancer treatment.   If you are pregnant, do not drink alcohol. If you are breastfeeding, be very cautious about drinking alcohol. If you are not pregnant and choose to drink alcohol, do not have more than 1 drink per day. One drink is considered to be 12 ounces (355 mL) of beer, 5 ounces (148 mL) of wine, or 1.5 ounces (44 mL) of liquor.   Avoid use of street drugs. Do not share needles with anyone. Ask for help if you need support or instructions about stopping the use of  drugs.   High blood pressure causes heart disease and increases the risk of stroke. Your blood pressure should be checked at least yearly.  Ongoing high blood pressure should be treated with medicines if weight loss and exercise do not work.   If you are 69-55 years old, ask your health care provider if you should take aspirin to prevent strokes.   Diabetes screening involves taking a blood sample to check your fasting blood sugar level. This should be done once every 3 years, after age 38, if you are within normal weight and without risk factors for diabetes. Testing should be considered at a younger age or be carried out more frequently if you are overweight and have at least 1 risk factor for diabetes.   Breast cancer screening is essential preventive care for women. You should practice "breast self-awareness."  This means understanding the normal appearance and feel of your breasts and may include breast self-examination.  Any changes detected, no matter how small, should be reported to a health care provider.  Women in their 80s and 30s should have a clinical breast exam (CBE) by a health care provider as part of a regular health exam every 1 to 3 years.  After age 66, women should have a CBE every year.  Starting at age 1, women should consider having a mammogram (breast X-ray test) every year.  Women who have a family history of breast cancer should talk to their health care provider about genetic screening.  Women at a high risk of breast cancer should talk to their health care providers about having an MRI and a mammogram every year.   -Breast cancer gene (BRCA)-related cancer risk assessment is recommended for women who have family members with BRCA-related cancers. BRCA-related cancers include breast, ovarian, tubal, and peritoneal cancers. Having family members with these cancers may be associated with an increased risk for harmful changes (mutations) in the breast cancer genes BRCA1 and  BRCA2. Results of the assessment will determine the need for genetic counseling and BRCA1 and BRCA2 testing.   The Pap test is a screening test for cervical cancer. A Pap test can show cell changes on the cervix that might become cervical cancer if left untreated. A Pap test is a procedure in which cells are obtained and examined from the lower end of the uterus (cervix).   - Women should have a Pap test starting at age 57.   - Between ages 90 and 70, Pap tests should be repeated every 2 years.   - Beginning at age 63, you should have a Pap test every 3 years as long as the past 3 Pap tests have been normal.   - Some women have medical problems that increase the chance of getting cervical cancer. Talk to your health care provider about these problems. It is especially important to talk to your health care provider if a  new problem develops soon after your last Pap test. In these cases, your health care provider may recommend more frequent screening and Pap tests.   - The above recommendations are the same for women who have or have not gotten the vaccine for human papillomavirus (HPV).   - If you had a hysterectomy for a problem that was not cancer or a condition that could lead to cancer, then you no longer need Pap tests. Even if you no longer need a Pap test, a regular exam is a good idea to make sure no other problems are starting.   - If you are between ages 36 and 66 years, and you have had normal Pap tests going back 10 years, you no longer need Pap tests. Even if you no longer need a Pap test, a regular exam is a good idea to make sure no other problems are starting.   - If you have had past treatment for cervical cancer or a condition that could lead to cancer, you need Pap tests and screening for cancer for at least 20 years after your treatment.   - If Pap tests have been discontinued, risk factors (such as a new sexual partner) need to be reassessed to determine if screening should  be resumed.   - The HPV test is an additional test that may be used for cervical cancer screening. The HPV test looks for the virus that can cause the cell changes on the cervix. The cells collected during the Pap test can be tested for HPV. The HPV test could be used to screen women aged 70 years and older, and should be used in women of any age who have unclear Pap test results. After the age of 67, women should have HPV testing at the same frequency as a Pap test.   Colorectal cancer can be detected and often prevented. Most routine colorectal cancer screening begins at the age of 57 years and continues through age 26 years. However, your health care provider may recommend screening at an earlier age if you have risk factors for colon cancer. On a yearly basis, your health care provider may provide home test kits to check for hidden blood in the stool.  Use of a small camera at the end of a tube, to directly examine the colon (sigmoidoscopy or colonoscopy), can detect the earliest forms of colorectal cancer. Talk to your health care provider about this at age 23, when routine screening begins. Direct exam of the colon should be repeated every 5 -10 years through age 49 years, unless early forms of pre-cancerous polyps or small growths are found.   People who are at an increased risk for hepatitis B should be screened for this virus. You are considered at high risk for hepatitis B if:  -You were born in a country where hepatitis B occurs often. Talk with your health care provider about which countries are considered high risk.  - Your parents were born in a high-risk country and you have not received a shot to protect against hepatitis B (hepatitis B vaccine).  - You have HIV or AIDS.  - You use needles to inject street drugs.  - You live with, or have sex with, someone who has Hepatitis B.  - You get hemodialysis treatment.  - You take certain medicines for conditions like cancer, organ  transplantation, and autoimmune conditions.   Hepatitis C blood testing is recommended for all people born from [birth year] through 1965 and any individual  with known risks for hepatitis C.   Practice safe sex. Use condoms and avoid high-risk sexual practices to reduce the spread of sexually transmitted infections (STIs). STIs include gonorrhea, chlamydia, syphilis, trichomonas, herpes, HPV, and human immunodeficiency virus (HIV). Herpes, HIV, and HPV are viral illnesses that have no cure. They can result in disability, cancer, and death. Sexually active women aged 25 years and younger should be checked for chlamydia. Older women with new or multiple partners should also be tested for chlamydia. Testing for other STIs is recommended if you are sexually active and at increased risk.   Osteoporosis is a disease in which the bones lose minerals and strength with aging. This can result in serious bone fractures or breaks. The risk of osteoporosis can be identified using a bone density scan. Women ages 65 years and over and women at risk for fractures or osteoporosis should discuss screening with their health care providers. Ask your health care provider whether you should take a calcium supplement or vitamin D to There are also several preventive steps women can take to avoid osteoporosis and resulting fractures or to keep osteoporosis from worsening. -->Recommendations include:  Eat a balanced diet high in fruits, vegetables, calcium, and vitamins.  Get enough calcium. The recommended total intake of is 1,200 mg daily; for best absorption, if taking supplements, divide doses into 250-500 mg doses throughout the day. Of the two types of calcium, calcium carbonate is best absorbed when taken with food but calcium citrate can be taken on an empty stomach.  Get enough vitamin D. NAMS and the National Osteoporosis Foundation recommend at least 1,000 IU per day for women age 50 and over who are at risk of vitamin D  deficiency. Vitamin D deficiency can be caused by inadequate sun exposure (for example, those who live in northern latitudes).  Avoid alcohol and smoking. Heavy alcohol intake (more than 7 drinks per week) increases the risk of falls and hip fracture and women smokers tend to lose bone more rapidly and have lower bone mass than nonsmokers. Stopping smoking is one of the most important changes women can make to improve their health and decrease risk for disease.  Be physically active every day. Weight-bearing exercise (for example, fast walking, hiking, jogging, and weight training) may strengthen bones or slow the rate of bone loss that comes with aging. Balancing and muscle-strengthening exercises can reduce the risk of falling and fracture.  Consider therapeutic medications. Currently, several types of effective drugs are available. Healthcare providers can recommend the type most appropriate for each woman.  Eliminate environmental factors that may contribute to accidents. Falls cause nearly 90% of all osteoporotic fractures, so reducing this risk is an important bone-health strategy. Measures include ample lighting, removing obstructions to walking, using nonskid rugs on floors, and placing mats and/or grab bars in showers.  Be aware of medication side effects. Some common medicines make bones weaker. These include a type of steroid drug called glucocorticoids used for arthritis and asthma, some antiseizure drugs, certain sleeping pills, treatments for endometriosis, and some cancer drugs. An overactive thyroid gland or using too much thyroid hormone for an underactive thyroid can also be a problem. If you are taking these medicines, talk to your doctor about what you can do to help protect your bones.reduce the rate of osteoporosis.    Menopause can be associated with physical symptoms and risks. Hormone replacement therapy is available to decrease symptoms and risks. You should talk to your  health care provider   about whether hormone replacement therapy is right for you.   Use sunscreen. Apply sunscreen liberally and repeatedly throughout the day. You should seek shade when your shadow is shorter than you. Protect yourself by wearing long sleeves, pants, a wide-brimmed hat, and sunglasses year round, whenever you are outdoors.   Once a month, do a whole body skin exam, using a mirror to look at the skin on your back. Tell your health care provider of new moles, moles that have irregular borders, moles that are larger than a pencil eraser, or moles that have changed in shape or color.   -Stay current with required vaccines (immunizations).   Influenza vaccine. All adults should be immunized every year.  Tetanus, diphtheria, and acellular pertussis (Td, Tdap) vaccine. Pregnant women should receive 1 dose of Tdap vaccine during each pregnancy. The dose should be obtained regardless of the length of time since the last dose. Immunization is preferred during the 27th 36th week of gestation. An adult who has not previously received Tdap or who does not know her vaccine status should receive 1 dose of Tdap. This initial dose should be followed by tetanus and diphtheria toxoids (Td) booster doses every 10 years. Adults with an unknown or incomplete history of completing a 3-dose immunization series with Td-containing vaccines should begin or complete a primary immunization series including a Tdap dose. Adults should receive a Td booster every 10 years.  Varicella vaccine. An adult without evidence of immunity to varicella should receive 2 doses or a second dose if she has previously received 1 dose. Pregnant females who do not have evidence of immunity should receive the first dose after pregnancy. This first dose should be obtained before leaving the health care facility. The second dose should be obtained 4 8 weeks after the first dose.  Human papillomavirus (HPV) vaccine. Females aged 13 26  years who have not received the vaccine previously should obtain the 3-dose series. The vaccine is not recommended for use in pregnant females. However, pregnancy testing is not needed before receiving a dose. If a female is found to be pregnant after receiving a dose, no treatment is needed. In that case, the remaining doses should be delayed until after the pregnancy. Immunization is recommended for any person with an immunocompromised condition through the age of 26 years if she did not get any or all doses earlier. During the 3-dose series, the second dose should be obtained 4 8 weeks after the first dose. The third dose should be obtained 24 weeks after the first dose and 16 weeks after the second dose.  Zoster vaccine. One dose is recommended for adults aged 60 years or older unless certain conditions are present.  Measles, mumps, and rubella (MMR) vaccine. Adults born before 1957 generally are considered immune to measles and mumps. Adults born in 1957 or later should have 1 or more doses of MMR vaccine unless there is a contraindication to the vaccine or there is laboratory evidence of immunity to each of the three diseases. A routine second dose of MMR vaccine should be obtained at least 28 days after the first dose for students attending postsecondary schools, health care workers, or international travelers. People who received inactivated measles vaccine or an unknown type of measles vaccine during 1963 1967 should receive 2 doses of MMR vaccine. People who received inactivated mumps vaccine or an unknown type of mumps vaccine before 1979 and are at high risk for mumps infection should consider immunization with 2 doses of   MMR vaccine. For females of childbearing age, rubella immunity should be determined. If there is no evidence of immunity, females who are not pregnant should be vaccinated. If there is no evidence of immunity, females who are pregnant should delay immunization until after pregnancy.  Unvaccinated health care workers born before [birth year] who lack laboratory evidence of measles, mumps, or rubella immunity or laboratory confirmation of disease should consider measles and mumps immunization with 2 doses of MMR vaccine or rubella immunization with 1 dose of MMR vaccine.  Pneumococcal 13-valent conjugate (PCV13) vaccine. When indicated, a person who is uncertain of her immunization history and has no record of immunization should receive the PCV13 vaccine. An adult aged 54 years or older who has certain medical conditions and has not been previously immunized should receive 1 dose of PCV13 vaccine. This PCV13 should be followed with a dose of pneumococcal polysaccharide (PPSV23) vaccine. The PPSV23 vaccine dose should be obtained at least 8 weeks after the dose of PCV13 vaccine. An adult aged 58 years or older who has certain medical conditions and previously received 1 or more doses of PPSV23 vaccine should receive 1 dose of PCV13. The PCV13 vaccine dose should be obtained 1 or more years after the last PPSV23 vaccine dose.  Pneumococcal polysaccharide (PPSV23) vaccine. When PCV13 is also indicated, PCV13 should be obtained first. All adults aged 58 years and older should be immunized. An adult younger than age 65 years who has certain medical conditions should be immunized. Any person who resides in a nursing home or long-term care facility should be immunized. An adult smoker should be immunized. People with an immunocompromised condition and certain other conditions should receive both PCV13 and PPSV23 vaccines. People with human immunodeficiency virus (HIV) infection should be immunized as soon as possible after diagnosis. Immunization during chemotherapy or radiation therapy should be avoided. Routine use of PPSV23 vaccine is not recommended for American Indians, [place] Natives, or people younger than 65 years unless there are medical conditions that require PPSV23 vaccine. When indicated,  people who have unknown immunization and have no record of immunization should receive PPSV23 vaccine. One-time revaccination 5 years after the first dose of PPSV23 is recommended for people aged 70 64 years who have chronic kidney failure, nephrotic syndrome, asplenia, or immunocompromised conditions. People who received 1 2 doses of PPSV23 before age 32 years should receive another dose of PPSV23 vaccine at age 96 years or later if at least 5 years have passed since the previous dose. Doses of PPSV23 are not needed for people immunized with PPSV23 at or after age 55 years.  Meningococcal vaccine. Adults with asplenia or persistent complement component deficiencies should receive 2 doses of quadrivalent meningococcal conjugate (MenACWY-D) vaccine. The doses should be obtained at least 2 months apart. Microbiologists working with certain meningococcal bacteria, [employer] recruits, people at risk during an outbreak, and people who travel to or live in countries with a high rate of meningitis should be immunized. A first-year college student up through age 58 years who is living in a residence hall should receive a dose if she did not receive a dose on or after her 16th birthday. Adults who have certain high-risk conditions should receive one or more doses of vaccine.  Hepatitis A vaccine. Adults who wish to be protected from this disease, have certain high-risk conditions, work with hepatitis A-infected animals, work in hepatitis A research labs, or travel to or work in countries with a high rate of hepatitis A should be  immunized. Adults who were previously unvaccinated and who anticipate close contact with an international adoptee during the first 60 days after arrival in the [place] from a country with a high rate of hepatitis A should be immunized.  Hepatitis B vaccine.  Adults who wish to be protected from this disease, have certain high-risk conditions, may be exposed to blood or other infectious  body fluids, are household contacts or sex partners of hepatitis B positive people, are clients or workers in certain care facilities, or travel to or work in countries with a high rate of hepatitis B should be immunized.  Haemophilus influenzae type b (Hib) vaccine. A previously unvaccinated person with asplenia or sickle cell disease or having a scheduled splenectomy should receive 1 dose of Hib vaccine. Regardless of previous immunization, a recipient of a hematopoietic stem cell transplant should receive a 3-dose series 6 12 months after her successful transplant. Hib vaccine is not recommended for adults with HIV infection.  Preventive Services / Frequency Ages 6 to 39years  Blood pressure check.** / Every 1 to 2 years.  Lipid and cholesterol check.** / Every 5 years beginning at age 39.  Clinical breast exam.** / Every 3 years for women in their 61s and 62s.  BRCA-related cancer risk assessment.** / For women who have family members with a BRCA-related cancer (breast, ovarian, tubal, or peritoneal cancers).  Pap test.** / Every 2 years from ages 47 through 85. Every 3 years starting at age 34 through age 12 or 74 with a history of 3 consecutive normal Pap tests.  HPV screening.** / Every 3 years from ages 46 through ages 43 to 54 with a history of 3 consecutive normal Pap tests.  Hepatitis C blood test.** / For any individual with known risks for hepatitis C.  Skin self-exam. / Monthly.  Influenza vaccine. / Every year.  Tetanus, diphtheria, and acellular pertussis (Tdap, Td) vaccine.** / Consult your health care provider. Pregnant women should receive 1 dose of Tdap vaccine during each pregnancy. 1 dose of Td every 10 years.  Varicella vaccine.** / Consult your health care provider. Pregnant females who do not have evidence of immunity should receive the first dose after pregnancy.  HPV vaccine. / 3 doses over 6 months, if 64 and younger. The vaccine is not recommended for use in  pregnant females. However, pregnancy testing is not needed before receiving a dose.  Measles, mumps, rubella (MMR) vaccine.** / You need at least 1 dose of MMR if you were born in 1957 or later. You may also need a 2nd dose. For females of childbearing age, rubella immunity should be determined. If there is no evidence of immunity, females who are not pregnant should be vaccinated. If there is no evidence of immunity, females who are pregnant should delay immunization until after pregnancy.  Pneumococcal 13-valent conjugate (PCV13) vaccine.** / Consult your health care provider.  Pneumococcal polysaccharide (PPSV23) vaccine.** / 1 to 2 doses if you smoke cigarettes or if you have certain conditions.  Meningococcal vaccine.** / 1 dose if you are age 71 to 37 years and a Market researcher living in a residence hall, or have one of several medical conditions, you need to get vaccinated against meningococcal disease. You may also need additional booster doses.  Hepatitis A vaccine.** / Consult your health care provider.  Hepatitis B vaccine.** / Consult your health care provider.  Haemophilus influenzae type b (Hib) vaccine.** / Consult your health care provider.  Ages 55 to 64years  Blood pressure check.** / Every 1 to 2 years.  Lipid and cholesterol check.** / Every 5 years beginning at age 60 years.  Lung cancer screening. / Every year if you are aged 31 80 years and have a 30-pack-year history of smoking and currently smoke or have quit within the past 15 years. Yearly screening is stopped once you have quit smoking for at least 15 years or develop a health problem that would prevent you from having lung cancer treatment.  Clinical breast exam.** / Every year after age 79 years.  BRCA-related cancer risk assessment.** / For women who have family members with a BRCA-related cancer (breast, ovarian, tubal, or peritoneal cancers).  Mammogram.** / Every year beginning at age 66  years and continuing for as long as you are in good health. Consult with your health care provider.  Pap test.** / Every 3 years starting at age 76 years through age 29 or 35 years with a history of 3 consecutive normal Pap tests.  HPV screening.** / Every 3 years from ages 76 years through ages 24 to 53 years with a history of 3 consecutive normal Pap tests.  Fecal occult blood test (FOBT) of stool. / Every year beginning at age 28 years and continuing until age 63 years. You may not need to do this test if you get a colonoscopy every 10 years.  Flexible sigmoidoscopy or colonoscopy.** / Every 5 years for a flexible sigmoidoscopy or every 10 years for a colonoscopy beginning at age 16 years and continuing until age 5 years.  Hepatitis C blood test.** / For all people born from [birth year] through 1965 and any individual with known risks for hepatitis C.  Skin self-exam. / Monthly.  Influenza vaccine. / Every year.  Tetanus, diphtheria, and acellular pertussis (Tdap/Td) vaccine.** / Consult your health care provider. Pregnant women should receive 1 dose of Tdap vaccine during each pregnancy. 1 dose of Td every 10 years.  Varicella vaccine.** / Consult your health care provider. Pregnant females who do not have evidence of immunity should receive the first dose after pregnancy.  Zoster vaccine.** / 1 dose for adults aged 39 years or older.  Measles, mumps, rubella (MMR) vaccine.** / You need at least 1 dose of MMR if you were born in 1957 or later. You may also need a 2nd dose. For females of childbearing age, rubella immunity should be determined. If there is no evidence of immunity, females who are not pregnant should be vaccinated. If there is no evidence of immunity, females who are pregnant should delay immunization until after pregnancy.  Pneumococcal 13-valent conjugate (PCV13) vaccine.** / Consult your health care provider.  Pneumococcal polysaccharide (PPSV23) vaccine.** / 1 to 2 doses if  you smoke cigarettes or if you have certain conditions.  Meningococcal vaccine.** / Consult your health care provider.  Hepatitis A vaccine.** / Consult your health care provider.  Hepatitis B vaccine.** / Consult your health care provider.  Haemophilus influenzae type b (Hib) vaccine.** / Consult your health care provider.  Ages 91 years and over  Blood pressure check.** / Every 1 to 2 years.  Lipid and cholesterol check.** / Every 5 years beginning at age 33 years.  Lung cancer screening. / Every year if you are aged 30 80 years and have a 30-pack-year history of smoking and currently smoke or have quit within the past 15 years. Yearly screening is stopped once you have quit smoking for at least 15 years or develop a health problem that  would prevent you from having lung cancer treatment.  Clinical breast exam.** / Every year after age 95 years.  BRCA-related cancer risk assessment.** / For women who have family members with a BRCA-related cancer (breast, ovarian, tubal, or peritoneal cancers).  Mammogram.** / Every year beginning at age 47 years and continuing for as long as you are in good health. Consult with your health care provider.  Pap test.** / Every 3 years starting at age 48 years through age 42 or 81 years with 3 consecutive normal Pap tests. Testing can be stopped between 65 and 70 years with 3 consecutive normal Pap tests and no abnormal Pap or HPV tests in the past 10 years.  HPV screening.** / Every 3 years from ages 22 years through ages 37 or 52 years with a history of 3 consecutive normal Pap tests. Testing can be stopped between 65 and 70 years with 3 consecutive normal Pap tests and no abnormal Pap or HPV tests in the past 10 years.  Fecal occult blood test (FOBT) of stool. / Every year beginning at age 71 years and continuing until age 48 years. You may not need to do this test if you get a colonoscopy every 10 years.  Flexible sigmoidoscopy or colonoscopy.** /  Every 5 years for a flexible sigmoidoscopy or every 10 years for a colonoscopy beginning at age 58 years and continuing until age 68 years.  Hepatitis C blood test.** / For all people born from [birth year] through 1965 and any individual with known risks for hepatitis C.  Osteoporosis screening.** / A one-time screening for women ages 97 years and over and women at risk for fractures or osteoporosis.  Skin self-exam. / Monthly.  Influenza vaccine. / Every year.  Tetanus, diphtheria, and acellular pertussis (Tdap/Td) vaccine.** / 1 dose of Td every 10 years.  Varicella vaccine.** / Consult your health care provider.  Zoster vaccine.** / 1 dose for adults aged 65 years or older.  Pneumococcal 13-valent conjugate (PCV13) vaccine.** / Consult your health care provider.  Pneumococcal polysaccharide (PPSV23) vaccine.** / 1 dose for all adults aged 43 years and older.  Meningococcal vaccine.** / Consult your health care provider.  Hepatitis A vaccine.** / Consult your health care provider.  Hepatitis B vaccine.** / Consult your health care provider.  Haemophilus influenzae type b (Hib) vaccine.** / Consult your health care provider. ** Family history and personal history of risk and conditions may change your health care provider's recommendations. Document Released: 12/06/2001 Document Revised: 07/31/2013  [facility] Patient Information 2014 [place], [place].   EXERCISE AND DIET:  We recommended that you start or continue a regular exercise program for good health. Regular exercise means any activity that makes your heart beat faster and makes you sweat.  We recommend exercising at least 30 minutes per day at least 3 days a week, preferably 5.  We also recommend a diet low in fat and sugar / carbohydrates.  Inactivity, poor dietary choices and obesity can cause diabetes, heart attack, stroke, and kidney damage, among others.     ALCOHOL AND SMOKING:  Women should limit their alcohol intake to no  more than 7 drinks/beers/glasses of wine (combined, not each!) per week. Moderation of alcohol intake to this level decreases your risk of breast cancer and liver damage.  ( And of course, no recreational drugs are part of a healthy lifestyle.)  Also, you should not be smoking at all or even being exposed to second hand smoke. Most people know smoking can  cause cancer, and various heart and lung diseases, but did you know it also contributes to weakening of your bones?  Aging of your skin?  Yellowing of your teeth and nails?   CALCIUM AND VITAMIN D:  Adequate intake of calcium and Vitamin D are recommended.  The recommendations for exact amounts of these supplements seem to change often, but generally speaking 600 mg of calcium (either carbonate or citrate) and 800 units of Vitamin D per day seems prudent. Certain women may benefit from higher intake of Vitamin D.  If you are among these women, your doctor will have told you during your visit.     PAP SMEARS:  Pap smears, to check for cervical cancer or precancers,  have traditionally been done yearly, although recent scientific advances have shown that most women can have pap smears less often.  However, every woman still should have a physical exam from her gynecologist or primary care physician every year. It will include a breast check, inspection of the vulva and vagina to check for abnormal growths or skin changes, a visual exam of the cervix, and then an exam to evaluate the size and shape of the uterus and ovaries.  And after 63 years of age, a rectal exam is indicated to check for rectal cancers. We will also provide age appropriate advice regarding health maintenance, like when you should have certain vaccines, screening for sexually transmitted diseases, bone density testing, colonoscopy, mammograms, etc.    MAMMOGRAMS:  All women over 97 years old should have a yearly mammogram. Many facilities now offer a "3D" mammogram, which may cost  around $50 extra out of pocket. If possible,  we recommend you accept the option to have the 3D mammogram performed.  It both reduces the number of women who will be called back for extra views which then turn out to be normal, and it is better than the routine mammogram at detecting truly abnormal areas.     COLONOSCOPY:  Colonoscopy to screen for colon cancer is recommended for all women at age 76.  We know, you hate the idea of the prep.  We agree, BUT, having colon cancer and not knowing it is worse!!  Colon cancer so often starts as a polyp that can be seen and removed at colonscopy, which can quite literally save your life!  And if your first colonoscopy is normal and you have no family history of colon cancer, most women don't have to have it again for 10 years.  Once every ten years, you can do something that may end up saving your life, right?  We will be happy to help you get it scheduled when you are ready.  Be sure to check your insurance coverage so you understand how much it will cost.  It may be covered as a preventative service at no cost, but you should check your particular policy.   Continue all medications as directed. Remain well hydrated and follow heart healthy diet. Remain as active as possible. New referral to cardiology placed, please request future cardiac medications from cardiology. Increased Pravastatin from 63m to 845monce daily to help reduce LDL (bad) cholesterol- will recheck cholesterol panel at follow-up. Continue with pain management as directed. Continue to check and report INR to use at least monthly, today was 2.7. Follow-up in 4 months for chronic medical conditions, please come fasting. NICE TO SEE YOU!

## 2018-10-31 NOTE — Assessment & Plan Note (Signed)
Continue all medications as directed. Remain well hydrated and follow heart healthy diet. Remain as active as possible. New referral to cardiology placed, please request future cardiac medications from cardiology. Increased Pravastatin from 40mg  to 80mg  once daily to help reduce LDL (bad) cholesterol- will recheck cholesterol panel at follow-up. Continue with pain management as directed. Continue to check and report INR to use at least monthly, today was 2.7. Follow-up in 4 months for chronic medical conditions, please come fasting.

## 2018-11-01 LAB — COMPREHENSIVE METABOLIC PANEL
ALT: 19 IU/L (ref 0–32)
AST: 28 IU/L (ref 0–40)
Albumin/Globulin Ratio: 1.3 (ref 1.2–2.2)
Albumin: 4.2 g/dL (ref 3.6–4.8)
Alkaline Phosphatase: 90 IU/L (ref 39–117)
BUN/Creatinine Ratio: 10 — ABNORMAL LOW (ref 12–28)
BUN: 8 mg/dL (ref 8–27)
Bilirubin Total: 0.5 mg/dL (ref 0.0–1.2)
CO2: 22 mmol/L (ref 20–29)
Calcium: 10 mg/dL (ref 8.7–10.3)
Chloride: 101 mmol/L (ref 96–106)
Creatinine, Ser: 0.77 mg/dL (ref 0.57–1.00)
GFR calc Af Amer: 96 mL/min/{1.73_m2} (ref >59)
GFR calc non Af Amer: 83 mL/min/{1.73_m2} (ref >59)
Globulin, Total: 3.2 g/dL (ref 1.5–4.5)
Glucose: 92 mg/dL (ref 65–99)
POTASSIUM: 4.3 mmol/L (ref 3.5–5.2)
Sodium: 142 mmol/L (ref 134–144)
Total Protein: 7.4 g/dL (ref 6.0–8.5)

## 2018-11-01 LAB — HEMOGLOBIN A1C
Est. average glucose Bld gHb Est-mCnc: 123 mg/dL
Hgb A1c MFr Bld: 5.9 % — ABNORMAL HIGH (ref 4.8–5.6)

## 2018-11-06 ENCOUNTER — Ambulatory Visit: Payer: Medicare HMO | Admitting: Physical Medicine & Rehabilitation

## 2018-11-26 ENCOUNTER — Encounter: Payer: Self-pay | Admitting: Cardiology

## 2018-11-26 ENCOUNTER — Ambulatory Visit: Payer: Medicare HMO | Admitting: Cardiology

## 2018-11-26 VITALS — BP 130/78 | HR 79 | Ht 63.0 in | Wt 267.6 lb

## 2018-11-26 DIAGNOSIS — I10 Essential (primary) hypertension: Secondary | ICD-10-CM | POA: Diagnosis not present

## 2018-11-26 DIAGNOSIS — Z7189 Other specified counseling: Secondary | ICD-10-CM | POA: Diagnosis not present

## 2018-11-26 DIAGNOSIS — R079 Chest pain, unspecified: Secondary | ICD-10-CM | POA: Diagnosis not present

## 2018-11-26 DIAGNOSIS — E782 Mixed hyperlipidemia: Secondary | ICD-10-CM | POA: Diagnosis not present

## 2018-11-26 NOTE — Patient Instructions (Signed)
Medication Instructions:  Your Physician recommend you continue on your current medication as directed.    If you need a refill on your cardiac medications before your next appointment, please call your pharmacy.   Lab work: None  Testing/Procedures: Your physician has requested that you have a lexiscan myoview. For further information please visit [email or web address]. Please follow instruction sheet, as given. [address]   Follow-Up: At [facility], you and your health needs are our priority.  As part of our continuing mission to provide you with exceptional heart care, we have created designated Provider Care Teams.  These Care Teams include your primary Cardiologist (physician) and Advanced Practice Providers (APPs -  Physician Assistants and Nurse Practitioners) who all work together to provide you with the care you need, when you need it. You will need a follow up appointment in 3 months.  Please call our office 2 months in advance to schedule this appointment.  You may see Buford Dresser, MD or one of the following Advanced Practice Providers on your designated Care Team:   Rosaria Ferries, PA-C . Jory Sims, DNP, ANP

## 2018-11-26 NOTE — Progress Notes (Signed)
Received Epic notification that pt has not read MyChart message regarding results.     This message is to inform you that the patient has not yet read the following message. (Notification date: November 15, 2018)  Recent lab results   From Fonnie Mu, CMA To [place] 11/01/2018 5:09 PM  Dear [name] asked that I inform you that your recent labs showed that your metabolic panel is stable and your A1c is 5.9, down from 6.3 in May. Great improvement!  Please be sure to see cardiology as [name] instructed.    Wishing you well,  Kenney Houseman, CMA for  Mina [name], NP   [address]   MyChart User Last Read On  [name] Not Read     Letter mailed to pt with results and recommendations. Charyl Bigger, CMA

## 2018-11-26 NOTE — Progress Notes (Signed)
Cardiology Office Note:    Date:  11/26/2018   ID:  [name], DOB [birth year], MRN [id number]  PCP:  Esaw Grandchild, NP  Cardiologist:  Buford Dresser, MD PhD  Referring MD: Esaw Grandchild, NP   CC: establish care, chest pain  History of Present Illness:    [name] is a 63 y.o. female with a hx of DVT/PE, hypertension, hyperlipidemia, sleep apnea, obesity prescribed CPAP who is seen as a new consult at the request of Danford, Katy D, NP for the evaluation and management of CAD with history of MI. She was seen by Dr. Einar Gip and Dr. Rex Kras in the past, but it has been several years.  Patient concerns today: was told she had to come by her PCP. No chest pain or shortness of breath. Has dyspnea on exertion chronically at baseline, unchanged. Has chronic mild LE edema as well.  Had chest pain last weekend, took two nitroglycerin. Was sitting on her walker organizing papers at the time, hurt across chest and down arm. Felt nauseous, presyncopal, like prior heart attack but didn't last as long. Only gets bad chest pain rarely. Did not have it evaluated as it eased up after the second nitroglycerin.   Saw Dr. Einar Gip in 2014 for MI, there is a consult note from the hospital in 2013 that there was no MI at that time, but she reports seeing him as an outpatient and being told she had a heart attack in the past. It was around the same time she was being treated for DVT/PE.  She believes she had a cath around 2014 with Dr. Einar Gip and was told there was no blockage. Has never had a stent or CABG.  Overall it is difficult to elicit a history/timeline on her symptoms and treatments, and I do not have Dr. Irven Shelling records available to me. She is tolerating her current cardiac medications of aspirin, furosemide PRN, losartan, metoprolol succinate, and pravastatin. She does endorse prior history of diazepam for anxiety, but she has not been able to get this filled in some time. She is on  coumadin chronically for her history of recurrent DVT/PE and has tolerated this without problems.  Past Medical History:  Diagnosis Date  . Acid reflux   . Allergy   . Anemia   . Arthritis   . Asthma   . Cataract   . Chronic pain   . Clotting disorder ([place])   . Colon polyps   . COPD (chronic obstructive pulmonary disease) ([place])   . Depression   . Diverticulitis   . Heart murmur   . Hiatal hernia   . High cholesterol   . History of UTI   . Hx of diverticulitis of colon    06-2018  . Hypertension   . Labral tear of shoulder    left  . Myocardial infarction ([place]) 2014  . Neuromuscular disorder ([place])   . Pneumonia 2014  . Pulmonary embolism ([place]) last 2014   x 3   . Ruptured disc, cervical    1 in neck, 1 in midback and 1 in low back, 6 bulging discs,  upper and lower back, 5 slipped discs all the way uop and down back and   . Sleep apnea    uses cpap, pt does not know settings    Past Surgical History:  Procedure Laterality Date  .  bone spurs removed from feet Bilateral   . ABDOMINAL HYSTERECTOMY    . BACK SURGERY  fusion x 2 lower back, laser surgery mid and back and neck also done  . BREAST BIOPSY Right   . CARPAL TUNNEL RELEASE Right   . CHOLECYSTECTOMY    . colon polyps    . COLONOSCOPY WITH PROPOFOL N/A 04/08/2016   Procedure: COLONOSCOPY WITH PROPOFOL;  Surgeon: Carol Ada, MD;  Location: [facility];  Service: Endoscopy;  Laterality: N/A;  . colonscopy     x 2, with polyps removed both times  . IR FL GUIDED LOC OF NEEDLE/CATH TIP FOR SPINAL INJECTION RT  06/04/2017  . JOINT REPLACEMENT    . REPLACEMENT TOTAL KNEE Right   . TUBAL LIGATION      Current Medications: Current Outpatient Medications on File Prior to Visit  Medication Sig  . acetaminophen-codeine (TYLENOL #3) 300-30 MG tablet Take 1 tablet by mouth every 8 (eight) hours as needed for moderate pain.  Marland Kitchen albuterol (PROVENTIL HFA;VENTOLIN HFA) 108 (90 Base) MCG/ACT inhaler Inhale 2 puffs into  the lungs every 6 (six) hours as needed. For wheezing  . albuterol (PROVENTIL) (2.5 MG/3ML) 0.083% nebulizer solution Take 3 mLs (2.5 mg total) by nebulization every 6 (six) hours as needed for wheezing or shortness of breath.  Marland Kitchen aspirin EC 81 MG tablet Take 81 mg by mouth daily at 12 noon.   . diclofenac sodium (VOLTAREN) 1 % GEL Apply 2 g topically 4 (four) times daily as needed (For pain in shoulder, knees, back and neck.).   Marland Kitchen ferrous sulfate 325 (65 FE) MG tablet Take 1 tablet (325 mg total) by mouth 2 (two) times daily with a meal.  . furosemide (LASIX) 40 MG tablet TAKE 1 TABLET (40 MG TOTAL) BY MOUTH 2 (TWO) TIMES DAILY AS NEEDED FOR FLUID. Future refills will needs to come from Cardiology  . Hypromellose (ARTIFICIAL TEARS OP) Place 1 drop into both eyes daily as needed (dry eyes).  Marland Kitchen lidocaine (LIDODERM) 5 % Place 1-3 patches onto the skin as needed.   Marland Kitchen losartan (COZAAR) 25 MG tablet Take 1 tablet (25 mg total) by mouth daily.  . methocarbamol (ROBAXIN) 500 MG tablet Take 1 tablet (500 mg total) by mouth every 6 (six) hours as needed for muscle spasms.  . metoprolol succinate (TOPROL-XL) 25 MG 24 hr tablet Take 1 tablet (25 mg total) by mouth daily.  . nitroGLYCERIN (NITROSTAT) 0.4 MG SL tablet Place 1 tablet (0.4 mg total) under the tongue every 5 (five) minutes as needed for chest pain.  Marland Kitchen nystatin [facility]) powder Apply 1 g topically daily as needed (rash).  Marland Kitchen omeprazole (PRILOSEC) 40 MG capsule TAKE 1 CAPSULE BY MOUTH TWICE A DAY  . ondansetron (ZOFRAN ODT) 8 MG disintegrating tablet 8mg  ODT q4 hours prn nausea  . pravastatin (PRAVACHOL) 80 MG tablet TAKE 1 TABLET BY MOUTH DAILY AT 12 NOON.  Marland Kitchen pregabalin (LYRICA) 50 MG capsule Take 1 capsule (50 mg total) by mouth 2 (two) times daily.  . Vitamin D, Ergocalciferol, (DRISDOL) 50000 units CAPS capsule Take 50,000 Units by mouth every 7 (seven) days.  Marland Kitchen warfarin (COUMADIN) 5 MG tablet TAKE 1 TABLET BY MOUTH ON MONDAY,WEDNESDAY,FRIDAY AND  A 1/2 TABLET THE REST OF THE WEEK  . fluticasone (FLONASE) 50 MCG/ACT nasal spray Place 1 spray into both nostrils as needed for allergies or rhinitis.  Marland Kitchen PRESCRIPTION MEDICATION CPAP  . SUMAtriptan (IMITREX) 5 MG/ACT nasal spray Place 1 spray (5 mg total) into the nose every 2 (two) hours as needed for migraine. Max daily dose 2 sprays/day (Patient  not taking: Reported on 11/26/2018)   No current facility-administered medications on file prior to visit.      Allergies:   Dexamethasone; Latex; Other; Shellfish allergy; and Tape has had IV contrast dye without issues or premedication.--  Social History   Socioeconomic History  . Marital status: Legally Separated    Spouse name: Not on file  . Number of children: Not on file  . Years of education: Not on file  . Highest education level: Not on file  Occupational History  . Not on file  Social Needs  . Financial resource strain: Not on file  . Food insecurity:    Worry: Not on file    Inability: Not on file  . Transportation needs:    Medical: Not on file    Non-medical: Not on file  Tobacco Use  . Smoking status: Never Smoker  . Smokeless tobacco: Never Used  Substance and Sexual Activity  . Alcohol use: No  . Drug use: No  . Sexual activity: Never  Lifestyle  . Physical activity:    Days per week: Not on file    Minutes per session: Not on file  . Stress: Not on file  Relationships  . Social connections:    Talks on phone: Not on file    Gets together: Not on file    Attends religious service: Not on file    Active member of club or organization: Not on file    Attends meetings of clubs or organizations: Not on file    Relationship status: Not on file  Other Topics Concern  . Not on file  Social History Narrative  . Not on file     Family History: The patient's family history includes Breast cancer in her cousin, maternal aunt, and paternal aunt; COPD in her father and mother; Cancer in an other family member;  Colon cancer in her maternal grandfather; Heart failure in her father, mother, and another family member; Pancreatic cancer in her paternal uncle. There is no history of Esophageal cancer, Rectal cancer, or Stomach cancer.  ROS:   Please see the history of present illness.  Additional pertinent ROS:  Constitutional: Negative for chills, fever, night sweats, unintentional weight loss  HENT: Negative for ear pain and hearing loss.   Eyes: Negative for loss of vision and eye pain.  Respiratory: Negative for cough, sputum, shortness of breath, wheezing.   Cardiovascular: See HPI. Gastrointestinal: Negative for abdominal pain, melena, and hematochezia.  Genitourinary: Negative for dysuria and hematuria.  Musculoskeletal: Negative for falls and myalgias.  Skin: Negative for itching and rash.  Neurological: Negative for focal weakness, focal sensory changes and loss of consciousness.  Endo/Heme/Allergies: Does not bruise/bleed easily.    EKGs/Labs/Other Studies Reviewed:    The following studies were reviewed today:  Echo 07/09/18: - Left ventricle: The cavity size was normal. Systolic function was   normal. The estimated ejection fraction was in the range of 60%   to 65%. Wall motion was normal; there were no regional wall   motion abnormalities. Doppler parameters are consistent with   abnormal left ventricular relaxation (grade 1 diastolic   dysfunction). - Mitral valve: Calcified annulus.  Echo 2010: - Limited study due to poor acoustic windows. - Overall left ventricular systolic function was normal. Left    ventricular ejection fraction was estimated to be 55 %. This    study was inadequate for the evaluation of left ventricular    regional wall motion. Left ventricular diastolic function  parameters were normal. Doppler parameters were consistent    with abnormal left ventricular relaxation.  Cath 2006:  HEMODYNAMIC DATA:   RIGHT HEART  CATHETERIZATION:  1.  Aortic pressures were 12/9 with a mean of 8 mmHg.  2.  RV pressures were 85/2 with end-diastolic pressure of 10 mmHg.  3.  PA pressures were 32/18 with a mean of 25 mmHg.  4.  Pulmonary capillary wedge was 18/18 with a mean of 16 mmHg.  5.  The PA saturation was 68% and FA saturation was 96%.  6.  The cardiac output by Fick was 4.6 with a cardiac index of 2.1.   LEFT HEART CATHETERIZATION:   HEMODYNAMIC DATA:  1.  The left ventricular pressure was 120/3 with an end-diastolic pressure      of 16 mmHg.  2.  The aortic pressure was 124/68 with a mean of 94 mmHg.  3.  There was no pressure gradient across the aortic valve.   ANGIOGRAPHIC DATA:  1.  Left ventricle:  Left ventricular systolic function was normal and the      ejection fraction was estimated at 60%.  There was no significant mitral      regurgitation.  2.  Right coronary artery:  The right coronary artery is a very large-      caliber vessel and the dominant vessel, which gives origin to a large      PDA and a large PLA.  It is a superdominant vessel.  It is normal.  3.  Left main:  The left main is a large-caliber vessel and is normal.  4.  Left circumflex:  The left circumflex is a moderate-caliber vessel.  It      gives origin to a small to moderate obtuse marginal 1 after continuing      as an OM-2.  5.  Left anterior descending artery:  The LAD is a large-caliber vessel that      gives origin to a large diagonal 1 and the LAD itself ends at the apex.      The LAD and the diagonal 1 are normal.   ASCENDING AORTOGRAM:  The ascending aortogram revealed the presence of three  aortic valve cusps.  There is no evidence of aortic regurgitation.   ABDOMINAL AORTOGRAM:  Abdominal aortogram revealed the presence of two renal  arteries, one on either side.  They were widely patent.  There is no  evidence of abdominal aortic aneurysm.   IMPRESSION:  1.  Normal coronary arteries with normal left  ventricular systolic function.      The right coronary artery is superdominant.  2.  Normal ascending aorta and abdominal aorta.  No evidence of renal artery      stenosis.  No evidence of ascending aortic aneurysm or abdominal aortic      aneurysm.  3.  Normal right heart pressures.  No evidence of pulmonary hypertension.   RECOMMENDATIONS:  Given her findings, continue prior medical regimen with  risk factor modification as indicated.  Evaluation for a noncardiac cause of  her chest pain is indicated.  Continued primary prevention is indicated, and  continued risk factor modification is indicated.  Evaluation for a  noncardiac cause of chest pain is indicated.  The patient will follow up  with Dr. Tamsen Roers.   EKG:  EKG is personally reviewed.  The ekg ordered today demonstrates normal sinus rhythm with LAFB, unchanged from ECG 07/08/18.  Recent Labs: 03/07/2018: TSH 2.760 09/13/2018: Hemoglobin 12.8; Platelets 450.0  10/31/2018: ALT 19; BUN 8; Creatinine, Ser 0.77; Potassium 4.3; Sodium 142  Recent Lipid Panel    Component Value Date/Time   CHOL 252 (H) 03/07/2018 1422   TRIG 270 (H) 03/07/2018 1422   HDL 48 03/07/2018 1422   CHOLHDL 5.3 (H) 03/07/2018 1422   LDLCALC 150 (H) 03/07/2018 1422    Physical Exam:    VS:  BP 130/78   Pulse 79   Ht 5\' 3"  (1.6 m)   Wt 267 lb 9.6 oz (121.4 kg)   BMI 47.40 kg/m     Wt Readings from Last 3 Encounters:  11/26/18 267 lb 9.6 oz (121.4 kg)  10/31/18 262 lb 11.2 oz (119.2 kg)  10/11/18 253 lb (114.8 kg)    GEN: Well nourished, well developed in no acute distress HEENT: Normal NECK: No JVD; No carotid bruits LYMPHATICS: No lymphadenopathy CARDIAC: regular rhythm, normal S1 and S2, no murmurs, rubs, gallops. Radial and DP pulses 2+ bilaterally. RESPIRATORY:  Clear to auscultation without rales, wheezing or rhonchi  ABDOMEN: Soft, non-tender, non-distended MUSCULOSKELETAL:  No edema; No deformity  SKIN: Warm and dry NEUROLOGIC:   Alert and oriented x 3 PSYCHIATRIC:  Normal affect   ASSESSMENT:    1. Chest pain, unspecified type   2. Essential hypertension   3. Obesity, Class III, BMI 40-49.9 (morbid obesity) ([place])   4. Mixed hyperlipidemia   5. Counseling on health promotion and disease prevention    PLAN:    1. Chest pain: concerning symptoms, has multiple risk factors including hypertension, hyperlipidemia.  -lexiscan stress test  2. Hypertension: at goal today.  -continue furosemide, metoprolol succinate, losartan  3. Hyperlipidemia: LDL 150, on pravastatin  4. Class 3 obesity, BMI 47: would benefit from weight loss  5. Primary prevention vs secondary prevention (unclear history) -recommend heart healthy/Mediterranean diet, with whole grains, fruits, vegetable, fish, lean meats, nuts, and olive oil. Limit salt. -reviewed AHA recommendations: moderate walking, 3-5 times/week for 30-50 minutes each session. Aim for at least 150 minutes.week. Goal should be pace of 3 miles/hours, or walking 1.5 miles in 30 minutes. She has limited mobility and cannot achieve this. -recommend avoidance of tobacco products. Avoid excess alcohol. -on aspirin, but also on warfarin for DVT/PE. If stress test normal, would stop aspirin unless there is an additional indication  Plan for follow up: 3 mos  Medication Adjustments/Labs and Tests Ordered: Current medicines are reviewed at length with the patient today.  Concerns regarding medicines are outlined above.  Orders Placed This Encounter  Procedures  . MYOCARDIAL PERFUSION IMAGING  . EKG 12-Lead   No orders of the defined types were placed in this encounter.   Patient Instructions  Medication Instructions:  Your Physician recommend you continue on your current medication as directed.    If you need a refill on your cardiac medications before your next appointment, please call your pharmacy.   Lab work: None  Testing/Procedures: Your physician has requested  that you have a lexiscan myoview. For further information please visit [email or web address]. Please follow instruction sheet, as given. [address]   Follow-Up: At [facility], you and your health needs are our priority.  As part of our continuing mission to provide you with exceptional heart care, we have created designated Provider Care Teams.  These Care Teams include your primary Cardiologist (physician) and Advanced Practice Providers (APPs -  Physician Assistants and Nurse Practitioners) who all work together to provide you with the care you need, when you need  it. You will need a follow up appointment in 3 months.  Please call our office 2 months in advance to schedule this appointment.  You may see Buford Dresser, MD or one of the following Advanced Practice Providers on your designated Care Team:   Rosaria Ferries, PA-C . Jory Sims, DNP, ANP        Signed, Buford Dresser, MD PhD 11/26/2018 2:39 PM    [place]

## 2018-11-27 ENCOUNTER — Encounter: Payer: Self-pay | Admitting: Cardiology

## 2018-11-29 ENCOUNTER — Other Ambulatory Visit: Payer: Self-pay | Admitting: Adult Health

## 2018-11-30 ENCOUNTER — Other Ambulatory Visit: Payer: Self-pay | Admitting: Physical Medicine & Rehabilitation

## 2018-12-03 ENCOUNTER — Telehealth: Payer: Self-pay | Admitting: Registered Nurse

## 2018-12-03 DIAGNOSIS — Z7901 Long term (current) use of anticoagulants: Secondary | ICD-10-CM | POA: Diagnosis not present

## 2018-12-03 LAB — POCT INR: INR: 2.6 — AB (ref 0.9–1.1)

## 2018-12-03 MED ORDER — METHOCARBAMOL 500 MG PO TABS: 500.0000 mg | ORAL_TABLET | Freq: Three times a day (TID) | ORAL | 1 refills | Status: AC | PRN

## 2018-12-03 MED ORDER — ACETAMINOPHEN-CODEINE #3 300-30 MG PO TABS: 1.0000 | ORAL_TABLET | Freq: Four times a day (QID) | ORAL | 1 refills | Status: AC | PRN

## 2018-12-03 NOTE — Telephone Encounter (Addendum)
Left message for [name] to call and tell us how she is taking medication.  Looks like [name] increased in December to qid and she was supposed to call back and let us know how it was working. Her last fill was 10/11/18 #90.

## 2018-12-03 NOTE — Telephone Encounter (Signed)
Placed a call to Ms. Siciliano, awaiting a return call.

## 2018-12-03 NOTE — Telephone Encounter (Signed)
Mrs Glodowski called back and she is taking medication 4x day and was ok for a while but had done something and she is in a lot of increased pain. She said Zella Ball talked about changing her medication to something stronger and she is asking that she do so.  Please advise.

## 2018-12-03 NOTE — Telephone Encounter (Signed)
Placed a call to [name] she states she was getting OOB a few weeks ago and developed increased intensity of lower back pain. She denies falling. PMP was reviewed last prescription of Tylenol # 3 was filled in December, she never called office in two weeks as instructed. Today we will refill her Tylenol #3 and Robaxin. She has an allergy to Dexamethasone, she states in the past she was prescribed prednisone without any allergies. Discuss with [facility] Pharmacist, it may be aa possibility, at this time we will not prescribe Medrol dose pak. [name] is aware of the above and verbalizes understanding.

## 2018-12-04 ENCOUNTER — Telehealth: Payer: Self-pay

## 2018-12-04 NOTE — Telephone Encounter (Signed)
Received fax from [place].  Pt's INR for 12/03/2018 was 2.6.  Charyl Bigger, CMA

## 2018-12-10 ENCOUNTER — Telehealth (HOSPITAL_COMMUNITY): Payer: Self-pay | Admitting: *Deleted

## 2018-12-10 NOTE — Telephone Encounter (Signed)
Left message on voicemail per DPR in reference to upcoming appointment scheduled on 12/12/18 with detailed instructions given per Myocardial Perfusion Study Information Sheet for the test. LM to arrive 15 minutes early, and that it is imperative to arrive on time for appointment to keep from having the test rescheduled. If you need to cancel or reschedule your appointment, please call the office within 24 hours of your appointment. Failure to do so may result in a cancellation of your appointment, and a $50 no show fee. Phone number given for call back for any questions. Kirstie Peri

## 2018-12-12 ENCOUNTER — Ambulatory Visit (HOSPITAL_COMMUNITY): Payer: Medicare HMO | Attending: Internal Medicine

## 2018-12-12 VITALS — Ht 63.0 in | Wt 267.0 lb

## 2018-12-12 DIAGNOSIS — R079 Chest pain, unspecified: Secondary | ICD-10-CM | POA: Diagnosis not present

## 2018-12-12 MED ORDER — REGADENOSON 0.4 MG/5ML IV SOLN
0.4000 mg | Freq: Once | INTRAVENOUS | Status: AC
  Administered 2018-12-12: 0.4 mg via INTRAVENOUS

## 2018-12-12 MED ORDER — TECHNETIUM TC 99M TETROFOSMIN IV KIT
31.7000 | PACK | Freq: Once | INTRAVENOUS | Status: AC | PRN
  Administered 2018-12-12: 31.7 via INTRAVENOUS
  Filled 2018-12-12: qty 32

## 2018-12-13 ENCOUNTER — Ambulatory Visit (HOSPITAL_COMMUNITY): Payer: Medicare HMO | Attending: Cardiology

## 2018-12-13 MED ORDER — TECHNETIUM TC 99M TETROFOSMIN IV KIT
31.7000 | PACK | Freq: Once | INTRAVENOUS | Status: AC | PRN
  Administered 2018-12-13: 31.7 via INTRAVENOUS
  Filled 2018-12-13: qty 32

## 2018-12-17 LAB — MYOCARDIAL PERFUSION IMAGING
LV sys vol: 9 mL
LVDIAVOL: 43 mL (ref 46–106)
Peak HR: 90 {beats}/min
Rest HR: 78 {beats}/min
SDS: 2
SRS: 12
SSS: 14

## 2018-12-21 ENCOUNTER — Telehealth: Payer: Self-pay | Admitting: Registered Nurse

## 2018-12-21 NOTE — Telephone Encounter (Signed)
Return Ms. Barkow call, no answer, left message to return the call.

## 2018-12-21 NOTE — Telephone Encounter (Signed)
ET ptn called 02.27.20 concerned that 3x day to 4 x a day is not working - she was supposed to update you on progress - would like you to call [phone]

## 2018-12-31 ENCOUNTER — Other Ambulatory Visit: Payer: Self-pay | Admitting: Gastroenterology

## 2018-12-31 DIAGNOSIS — Z7901 Long term (current) use of anticoagulants: Secondary | ICD-10-CM | POA: Diagnosis not present

## 2018-12-31 DIAGNOSIS — I2699 Other pulmonary embolism without acute cor pulmonale: Secondary | ICD-10-CM | POA: Diagnosis not present

## 2018-12-31 LAB — POCT INR: INR: 2.8 — AB (ref 0.9–1.1)

## 2018-12-31 NOTE — Telephone Encounter (Signed)
Pt needs follow up appt in the office with Dr. Havery Moros and repeat blood work. (see 10-08-2018 endo notes).  Left message for pt to call back to schedule an appt. Dr. Loni Muse, which labs would you like her to have?

## 2018-12-31 NOTE — Telephone Encounter (Signed)
Thanks Jan, CBC, TIBC / ferritin panel

## 2019-01-01 ENCOUNTER — Other Ambulatory Visit: Payer: Self-pay

## 2019-01-01 DIAGNOSIS — E611 Iron deficiency: Secondary | ICD-10-CM

## 2019-01-01 NOTE — Progress Notes (Signed)
Called and left another message for pt to go to the lab for blood work and to call and schedule an appt with Dr.Armbruster. labs entered

## 2019-01-02 ENCOUNTER — Telehealth: Payer: Self-pay

## 2019-01-02 NOTE — Telephone Encounter (Signed)
Pt informed. Advised pt to repeat in 1 month.  Pt expressed understanding and is agreeable.  Charyl Bigger, CMA

## 2019-01-02 NOTE — Telephone Encounter (Signed)
Received fax from [facility].  INR for 12/31/2018 was 2.8.  Charyl Bigger, CMA

## 2019-01-02 NOTE — Telephone Encounter (Signed)
LVM for pt to call to discuss results.  T. Nelson, CMA  

## 2019-01-02 NOTE — Telephone Encounter (Signed)
Great! No change to current coumadin therapy. Thanks! Valetta Fuller

## 2019-01-09 ENCOUNTER — Other Ambulatory Visit: Payer: Self-pay | Admitting: Adult Health

## 2019-01-09 DIAGNOSIS — R69 Illness, unspecified: Secondary | ICD-10-CM | POA: Diagnosis not present

## 2019-01-10 ENCOUNTER — Other Ambulatory Visit: Payer: Self-pay | Admitting: Adult Health

## 2019-01-14 ENCOUNTER — Encounter: Payer: Medicare HMO | Admitting: Registered Nurse

## 2019-01-14 ENCOUNTER — Telehealth: Payer: Self-pay | Admitting: Registered Nurse

## 2019-01-14 NOTE — Telephone Encounter (Signed)
[name] called stating she wasn't feeling well, she denies fever, reports she is experiencing SOB and using her nebulizer. She reports she is asthmatic, she was instructed to go to emergency room for evaluation, she refused. She reports she has been dealing with this for a few months now. Also reiterated she needs to be evaluated and was instructed to go to the emergency room for evaluation, she states if her symptoms progressed she will go to the emergency room to be evaluated. Educated on the importance of being evaluated, she verbalizes understanding.

## 2019-01-30 ENCOUNTER — Other Ambulatory Visit: Payer: Self-pay | Admitting: Gastroenterology

## 2019-01-30 ENCOUNTER — Other Ambulatory Visit: Payer: Self-pay

## 2019-01-30 MED ORDER — FERROUS SULFATE 325 (65 FE) MG PO TABS: 325.0000 mg | ORAL_TABLET | Freq: Two times a day (BID) | ORAL | 0 refills | Status: DC

## 2019-01-31 DIAGNOSIS — Z7901 Long term (current) use of anticoagulants: Secondary | ICD-10-CM | POA: Diagnosis not present

## 2019-01-31 LAB — POCT INR: INR: 2.8 — AB (ref 0.9–1.1)

## 2019-02-04 ENCOUNTER — Telehealth: Payer: Self-pay

## 2019-02-04 NOTE — Telephone Encounter (Signed)
Pt's INR from 01/31/2019 was 2.8.  Pt is to continue current Coumadin dose.  LVM for pt to call to discuss.  Charyl Bigger, CMA

## 2019-02-05 NOTE — Telephone Encounter (Signed)
LVM for pt to call to discuss results.  T. Nelson, CMA  

## 2019-02-06 ENCOUNTER — Other Ambulatory Visit: Payer: Self-pay | Admitting: Adult Health

## 2019-02-06 NOTE — Telephone Encounter (Signed)
Pt informed of results.  Pt expressed understanding and is agreeable.  T. Nelson, CMA 

## 2019-02-08 ENCOUNTER — Ambulatory Visit: Payer: Medicare HMO | Admitting: Registered Nurse

## 2019-02-26 ENCOUNTER — Telehealth: Payer: Self-pay

## 2019-02-26 NOTE — Telephone Encounter (Signed)
Attempted to contact pt to change appointment to virtual visit. Left message to call back. 

## 2019-02-27 NOTE — Telephone Encounter (Signed)
Attempted to contact pt x 2 to change appointment to virtual visit. Left message to call back.  

## 2019-02-28 NOTE — Telephone Encounter (Signed)
Called patient to discuss tomorrow's appointment with patient. Left message notifiying that she will need to reschedule her appointment.

## 2019-03-01 ENCOUNTER — Ambulatory Visit: Payer: Medicare HMO | Admitting: Cardiology

## 2019-03-01 LAB — POCT INR: INR: 2.7 — AB (ref 0.9–1.1)

## 2019-03-02 ENCOUNTER — Other Ambulatory Visit: Payer: Self-pay | Admitting: Gastroenterology

## 2019-03-04 NOTE — Telephone Encounter (Signed)
Called and LM for pt to call and schedule an appt with Dr. Havery Moros.

## 2019-03-04 NOTE — Telephone Encounter (Signed)
Called and spoke to pt.  Scheduled her for a Follow up visit with Dr. Havery Moros for next week on Tuesday, 5-19.  Pt has a smart phone and Doximity was explained to pt.

## 2019-03-04 NOTE — Progress Notes (Signed)
Virtual Visit via Telephone Note  I connected with [name] on 03/05/2019 at  1:45 PM EDT by telephone and verified that I am speaking with the correct person using two identifiers.  Location: Patient: Home Provider: In Clinic   I discussed the limitations, risks, security and privacy concerns of performing an evaluation and management service by telephone and the availability of in person appointments. I also discussed with the patient that there may be a patient responsible charge related to this service. The patient expressed understanding and agreed to proceed.   History of Present Illness: 10/31/2018 OV: [name] is here for CPE She reports medication compliance, denies SE She reports INR drawn at home was 2.7, she denies unusual bleeding/bruising She reports using PRN Furosemide 40mg  BID daily the last 2 weeks due to bil lower ext edema She denies increase in dyspnea She reports cancelling 3 cardiology appt's- due to transportation issues She reports chronic depression r/t poor health and inability to perform ADLs She denies thoughts of harming herself/others and states "I have suffered too much in this life to miss out on heaven and I would never take my life". She life with one of her adult children, however states "he can be a bit lazy around the house" She tries to drink plenty of water and eats a diet mostly of processed meats and vegetables She denies tobacco/vape/ETOH use She is followed by Phys Med for chronic pain issues- L shoulder, back, bil hips/kness Of Note- She declined to undress for examination 03/04/2019 OV: [name] calls in today for f/u: HTN, Obesity, OSA, COPD, Asthma, Lifelong Coumadin therapy for prevention of DVT/PE  She established with new cardiologist on 11/26/2018-  -Dr. Shawna Orleans Christopher-continue furosemide, metoprolol succinate, losartan 12/13/2018- Myocardial Perfusion Imaging:  The left ventricular ejection fraction is hyperdynamic  (>65%).  Nuclear stress EF: 79%.  There was no ST segment deviation noted during stress.  Overall poor quality study but appears grossly normal.  This is a low risk study.  -She denies acute cardiac sx's, continues to experience dyspnea, however denies increase from her normal baseline She reports using inhaler and nebulizer daily. Chart review shows COPD dx around 2012, cannot find Spirometry test results.  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 -GI  started her on Ferrous Sulfate to treat iron def anemia, she reports tolerating if she takes with food - She reports mood is stable, denies SI/HI - She reports walking around house for exercise and "eating what my family brings me". -She has not left her home since end of Feb due to COVID-19 pandemic  Lab Results  Component Value Date   HGBA1C 5.9 (H) 10/31/2018   HGBA1C 6.3 (H) 03/07/2018   Patient Care Team    Relationship Specialty Notifications Start End  Mina [name] D, NP PCP - General Family Medicine  10/31/17   Buford Dresser, MD PCP - Cardiology Cardiology Admissions 11/21/18   Adrian Prows, MD Consulting Physician Cardiology  08/26/15     Patient Active Problem List   Diagnosis Date Noted  . Abnormal urinalysis 07/26/2018  . Lower abdominal pain   . Diverticulitis 07/08/2018  . Deep venous thrombosis ([place]) 06/29/2018  . Urinary frequency 06/29/2018  . Acute bronchitis with  COPD ([place]) 04/11/2018  . Chronic pain due to trauma 04/10/2018  . Osteoarthritis of left knee 04/10/2018  . 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 Lumbar post-laminectomy syndrome 04/10/2018  . Anxiety 11/02/2017  .  Healthcare maintenance 10/31/2017  . Chronic bilateral back pain 10/31/2017  . Pulmonary embolism ([place]) 10/31/2017  . COPD (chronic obstructive pulmonary disease) ([place]) 10/31/2017  . Sleep apnea 10/31/2017  . History of Coumadin therapy 10/31/2017  . Lumbar radiculopathy, chronic   . Essential hypertension   . Lumbar back pain with radiculopathy affecting right lower extremity 06/05/2017  . Palliative care by specialist   . Right leg pain 06/02/2017  . Severe low back pain 06/02/2017  . Acute right-sided low back pain with sciatica   . Recurrent UTI 05/10/2016  . Urinary incontinence 12/30/2015  . 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 UTI (urinary tract infection) 04/17/2013  . Diverticulitis of sigmoid colon 04/16/2013  . Obesity, Class III, BMI 40-49.9 (morbid obesity) ([place]) 04/16/2013  . Chronic narcotic dependence ([place]) 04/16/2013  . Asthma 11/16/2011  . GERD (gastroesophageal reflux disease) 11/16/2011  . Hyperlipidemia 11/16/2011     Past Medical History:  Diagnosis Date  . Acid reflux   . Allergy   . Anemia   . Arthritis   . Asthma   . Cataract   . Chronic pain   . Clotting disorder ([place])   . Colon polyps   . COPD (chronic obstructive pulmonary disease) ([place])   . Depression   . Diverticulitis   . Heart murmur   . Hiatal hernia   . High cholesterol   . History of UTI   . Hx of diverticulitis of colon    06-2018  . Hypertension   . Labral tear of shoulder    left  . Myocardial infarction ([place]) 2014  . Neuromuscular disorder ([place])   . Pneumonia 2014  . Pulmonary embolism ([place]) last 2014   x 3   . Ruptured disc, cervical    1 in neck, 1 in midback and 1 in low back, 6 bulging discs,  upper and lower back, 5 slipped discs all the way uop and down back and   . Sleep apnea    uses cpap, pt does not know settings     Past Surgical History:  Procedure Laterality Date  .  bone spurs removed from feet Bilateral   . ABDOMINAL HYSTERECTOMY    . BACK  SURGERY     fusion x 2 lower back, laser surgery mid and back and neck also done  . BREAST BIOPSY Right   . CARPAL TUNNEL RELEASE Right   . CHOLECYSTECTOMY    . colon polyps    . COLONOSCOPY WITH PROPOFOL N/A 04/08/2016   Procedure: COLONOSCOPY WITH PROPOFOL;  Surgeon: Carol Ada, MD;  Location: [facility];  Service: Endoscopy;  Laterality: N/A;  . colonscopy     x 2, with polyps removed both times  . IR FL GUIDED LOC OF NEEDLE/CATH TIP FOR SPINAL INJECTION RT  06/04/2017  . JOINT REPLACEMENT    . REPLACEMENT TOTAL KNEE Right   . TUBAL LIGATION       Family History  Problem Relation Age of Onset  . Heart failure Mother   . COPD Mother   . Heart failure Father   . COPD Father   . Cancer Other   . Heart failure Other   . Breast cancer Maternal Aunt   . Breast cancer Paternal Aunt   .  Breast cancer Cousin   . Pancreatic cancer Paternal Uncle   . Colon cancer Maternal Grandfather   . Esophageal cancer Neg Hx   . Rectal cancer Neg Hx   . Stomach cancer Neg Hx      Social History   Substance and Sexual Activity  Drug Use No     Social History   Substance and Sexual Activity  Alcohol Use No     Social History   Tobacco Use  Smoking Status Never Smoker  Smokeless Tobacco Never Used     Outpatient Encounter Medications as of 03/05/2019  Medication Sig Note  . acetaminophen-codeine (TYLENOL #3) 300-30 MG tablet Take 1 tablet by mouth every 6 (six) hours as needed for moderate pain.   Marland Kitchen albuterol (PROVENTIL HFA;VENTOLIN HFA) 108 (90 Base) MCG/ACT inhaler Inhale 2 puffs into the lungs every 6 (six) hours as needed. For wheezing   . albuterol (PROVENTIL) (2.5 MG/3ML) 0.083% nebulizer solution Take 3 mLs (2.5 mg total) by nebulization every 6 (six) hours as needed for wheezing or shortness of breath.   Marland Kitchen aspirin EC 81 MG tablet Take 81 mg by mouth daily at 12 noon.    . diclofenac sodium (VOLTAREN) 1 % GEL Apply 2 g topically 4 (four) times daily as needed (For pain  in shoulder, knees, back and neck.).    Marland Kitchen ferrous sulfate 325 (65 FE) MG tablet Take 1 tablet (325 mg total) by mouth 2 (two) times daily with a meal.   . fluticasone (FLONASE) 50 MCG/ACT nasal spray Place 1 spray into both nostrils as needed for allergies or rhinitis.   . furosemide (LASIX) 40 MG tablet TAKE 1 TABLET (40 MG TOTAL) BY MOUTH 2 (TWO) TIMES DAILY AS NEEDED FOR FLUID. Future refills will needs to come from Cardiology   . Hypromellose (ARTIFICIAL TEARS OP) Place 1 drop into both eyes daily as needed (dry eyes).   Marland Kitchen lidocaine (LIDODERM) 5 % Place 1-3 patches onto the skin as needed.    Marland Kitchen losartan (COZAAR) 25 MG tablet Take 1 tablet (25 mg total) by mouth daily.   . methocarbamol (ROBAXIN) 500 MG tablet Take 1 tablet (500 mg total) by mouth every 8 (eight) hours as needed for muscle spasms.   . metoprolol succinate (TOPROL-XL) 25 MG 24 hr tablet Take 1 tablet (25 mg total) by mouth daily.   . nitroGLYCERIN (NITROSTAT) 0.4 MG SL tablet Place 1 tablet (0.4 mg total) under the tongue every 5 (five) minutes as needed for chest pain.   Marland Kitchen nystatin [facility]) powder Apply 1 g topically daily as needed (rash).   Marland Kitchen omeprazole (PRILOSEC) 40 MG capsule TAKE 1 CAPSULE BY MOUTH TWICE A DAY   . ondansetron (ZOFRAN ODT) 8 MG disintegrating tablet 8mg  ODT q4 hours prn nausea   . pravastatin (PRAVACHOL) 80 MG tablet TAKE 1 TABLET BY MOUTH DAILY AT 12 NOON.   Marland Kitchen pregabalin (LYRICA) 50 MG capsule Take 1 capsule (50 mg total) by mouth 2 (two) times daily.   Marland Kitchen PRESCRIPTION MEDICATION CPAP 11/26/2018: Patient "hasn't used in a while" several months   . SUMAtriptan (IMITREX) 5 MG/ACT nasal spray USE 1 SPRAY INTO NOSTRIL EVERY 2 HOURS AS NEEDED FOR MIGRAINE, MAX DAILY DOSE 2 SPRAYS   . Vitamin D, Ergocalciferol, (DRISDOL) 50000 units CAPS capsule Take 50,000 Units by mouth every 7 (seven) days. 06/02/2017: Fridays  . warfarin (COUMADIN) 5 MG tablet TAKE 1 TABLET BY MOUTH ON MONDAY,WEDNESDAY,FRIDAY AND A 1/2 TABLET  THE REST OF THE  WEEK   . [DISCONTINUED] ferrous sulfate (FERROUSUL) 325 (65 FE) MG tablet Take 1 tablet (325 mg total) by mouth 2 (two) times daily with a meal.   . [DISCONTINUED] ferrous sulfate 325 (65 FE) MG tablet TAKE 1 TABLET BY MOUTH 2 TIMES DAILY WITH A MEAL. SCHEDULE APPT WITH DR. Havery Moros: [phone]    No facility-administered encounter medications on file as of 03/05/2019.    Review of Systems: General:   No F/C, wt loss Pulm:   No DIB,no increase SOB, pleuritic chest pain Card:  No CP, palpitations Abd:  No n/v/d or abd pain Ext:  No inc edema from baseline Pain: Neck, bil shoulder, bil knee pain- 5/10 This patient does not have sx concerning for COVID-19 Infection (ie; fever, chills, cough, new or worsening shortness of breath).  Allergies: Dexamethasone; Latex; Other; Shellfish allergy; and Tape  Body mass index is 49.6 kg/m.  Blood pressure 123/73, pulse 81, temperature 97.8 F (36.6 C), temperature source Oral, height 5\' 3"  (1.6 m), weight 280 lb (127 kg).  Observations/Objective: No acute distress noted during telephone conversation  Assessment and Plan: Continue all medications as directed Continue with Cardiologist, Phys Med, GI as directed. Remain as active as possible. Request anti-hypertensives from Cards Weight loss would help reduce dyspnea and could reduce joint pain- recommend Mediterranean diet, remain well hydrated Recommend Spirometry/PFT testing this fall Strongly recommend nightly CPAP use- improve sleep, reduce fatigue, improve CV health  COVID-19 Education: Signs and symptoms of COVID-19 infection were discussed with pt and how to seek care for testing.  The importance of following the Stay at Home order, and when out- Social Distancing and wearing a facial mask were discussed today.  Follow Up Instructions: F/u 4 months   I discussed the assessment and treatment plan with the patient. The patient was provided an opportunity to ask  questions and all were answered. The patient agreed with the plan and demonstrated an understanding of the instructions.   The patient was advised to call back or seek an in-person evaluation if the symptoms worsen or if the condition fails to improve as anticipated.  I provided 35 minutes of non-face-to-face time during this encounter.   Esaw Grandchild, NP

## 2019-03-05 ENCOUNTER — Other Ambulatory Visit: Payer: Self-pay

## 2019-03-05 ENCOUNTER — Encounter: Payer: Self-pay | Admitting: Adult Health

## 2019-03-05 ENCOUNTER — Ambulatory Visit (INDEPENDENT_AMBULATORY_CARE_PROVIDER_SITE_OTHER): Payer: Medicare HMO | Admitting: Adult Health

## 2019-03-05 VITALS — BP 123/73 | HR 81 | Temp 97.8°F | Ht 63.0 in | Wt 280.0 lb

## 2019-03-05 DIAGNOSIS — E782 Mixed hyperlipidemia: Secondary | ICD-10-CM

## 2019-03-05 DIAGNOSIS — Z9229 Personal history of other drug therapy: Secondary | ICD-10-CM | POA: Diagnosis not present

## 2019-03-05 DIAGNOSIS — I10 Essential (primary) hypertension: Secondary | ICD-10-CM

## 2019-03-05 DIAGNOSIS — Z0000 Encounter for general adult medical examination without abnormal findings: Secondary | ICD-10-CM | POA: Diagnosis not present

## 2019-03-05 NOTE — Assessment & Plan Note (Signed)
BP at goal 123/73, HR 81 Currently on Losartan 25mg  QD, Metoprolol Suc XL 25mg  QD, Furosemide 40mg  BID PRN edema Advised to request future refills from cards

## 2019-03-05 NOTE — Assessment & Plan Note (Signed)
Assessment and Plan: Continue all medications as directed Continue with Cardiologist, Phys Med, GI as directed. Remain as active as possible. Request anti-hypertensives from Cards Weight loss would help reduce dyspnea and could reduce joint pain- recommend Mediterranean diet, remain well hydrated Recommend Spirometry/PFT testing this fall Strongly recommend nightly CPAP use- improve sleep, reduce fatigue, improve CV health  COVID-19 Education: Signs and symptoms of COVID-19 infection were discussed with pt and how to seek care for testing.  The importance of following the Stay at Home order, and when out- Social Distancing and wearing a facial mask were discussed today.  Follow Up Instructions: F/u 4 months   I discussed the assessment and treatment plan with the patient. The patient was provided an opportunity to ask questions and all were answered. The patient agreed with the plan and demonstrated an understanding of the instructions.   The patient was advised to call back or seek an in-person evaluation if the symptoms worsen or if the condition fails to improve as anticipated.

## 2019-03-05 NOTE — Assessment & Plan Note (Signed)
Last INR 2.7, denies unusual bleeding/bruising Prevention of DVT/PE

## 2019-03-05 NOTE — Assessment & Plan Note (Signed)
Pravastatin 80mg  QD

## 2019-03-07 LAB — POCT INR: INR: 2.6 — AB (ref 0.9–1.1)

## 2019-03-12 ENCOUNTER — Encounter: Payer: Self-pay | Admitting: Gastroenterology

## 2019-03-12 ENCOUNTER — Other Ambulatory Visit: Payer: Self-pay

## 2019-03-12 ENCOUNTER — Ambulatory Visit (INDEPENDENT_AMBULATORY_CARE_PROVIDER_SITE_OTHER): Payer: Medicare HMO | Admitting: Gastroenterology

## 2019-03-12 VITALS — Ht 63.0 in | Wt 280.0 lb

## 2019-03-12 DIAGNOSIS — D509 Iron deficiency anemia, unspecified: Secondary | ICD-10-CM

## 2019-03-12 DIAGNOSIS — K760 Fatty (change of) liver, not elsewhere classified: Secondary | ICD-10-CM

## 2019-03-12 NOTE — Progress Notes (Signed)
Virtual Visit via Video Note  I connected with [name] on 03/12/19 at  3:00 PM EDT by a video enabled telemedicine application and verified that I am speaking with the correct person using two identifiers.  I discussed the limitations of evaluation and management by telemedicine and the availability of in person appointments. The patient expressed understanding and agreed to proceed.  THIS ENCOUNTER IS A VIRTUAL VISIT DUE TO COVID-19 - PATIENT WAS NOT SEEN IN THE OFFICE. PATIENT HAS CONSENTED TO VIRTUAL VISIT / TELEMEDICINE VISIT VIA DOXIMITY APP   Location of patient: home Location of provider: office Persons participating: myself, patient  HPI :  63 year old female here for a follow up visit. She has multiple medical problems including fatty liver, diverticulitis, chronic back pain, history of PE on Coumadin, morbid obesity, iron deficiency.  At our last visit we reviewed her liver imaging, which was c/w fatty liver. She denied alcohol use. She had some lab workup done which showed no evidence of hemochromatosis or viral hepatitis. Her ALT has historically been normal. We discussed the need for weight loss to manage this, reduce her risk for NASH / cirrhosis. She was not immune to hep A and B, recommended to have Twinrix but not yet done. She has had a hard time losing weight, in fact increased weight almost 30 lbs since our last visit.   Of note, on the labs that were drawn she had evidence of iron deficiency. In December Hgb showed 12.8, MCV of 70, ferritin of 9.2, and iron sat of 7% . She previously was anemic with Hgb of 9.7 in December. I placed her on iron supplementation and we proceed with EGD which did not show a clear cause for her anemia. Results as below. She had a colonoscopy June 2017 for rectal bleeding which did not show any pathology, per Dr. Benson Norway, she had a few small adenomas removed. I had recommended some follow up blood work after being on iron for a few weeks,  has not been done yet. No blood in the stools. Taking iron for a few months which makes her stools dark. No trouble with constipation or bowel trouble.  No abdominal pains. No recurrence of diverticulitis since I have seen her. Taking omeprazole 40mg  twice daily and working well for her reflux.  EGD 10/08/2018 - small HH, multiple benign fundic gland gastric polyps, H pylori negative  Colonoscopy 04/08/2016 - 2 adenomas, diverticulosis - repeat in 5 years - Dr. Benson Norway  MRI liver 08/04/2018 - hepatomegaly, "geographic" steatosis CT scan 07/08/2018 - sigmoid diverticulitis, fatty liver CT scan 10/18/2017 - diverticulitis sigmoid colon, fatty liver CT scan 04/16/2013 - sigmoid diverticulitis CT scans 2012 - sigmoid diverticulitis  Past Medical History:  Diagnosis Date  . Acid reflux   . Allergy   . Anemia   . Arthritis   . Asthma   . Cataract   . Chronic pain   . Clotting disorder ([place])   . Colon polyps   . COPD (chronic obstructive pulmonary disease) ([place])   . Depression   . Diverticulitis   . Heart murmur   . Hiatal hernia   . High cholesterol   . History of UTI   . Hx of diverticulitis of colon    06-2018  . Hypertension   . Labral tear of shoulder    left  . Myocardial infarction ([place]) 2014  . Neuromuscular disorder ([place])   . Pneumonia 2014  . Pulmonary embolism ([place]) last 2014   x  3   . Ruptured disc, cervical    1 in neck, 1 in midback and 1 in low back, 6 bulging discs,  upper and lower back, 5 slipped discs all the way uop and down back and   . Sleep apnea    uses cpap, pt does not know settings     Past Surgical History:  Procedure Laterality Date  .  bone spurs removed from feet Bilateral   . ABDOMINAL HYSTERECTOMY    . BACK SURGERY     fusion x 2 lower back, laser surgery mid and back and neck also done  . BREAST BIOPSY Right   . CARPAL TUNNEL RELEASE Right   . CHOLECYSTECTOMY    . colon polyps    . COLONOSCOPY WITH PROPOFOL N/A 04/08/2016   Procedure:  COLONOSCOPY WITH PROPOFOL;  Surgeon: Carol Ada, MD;  Location: [facility];  Service: Endoscopy;  Laterality: N/A;  . colonscopy     x 2, with polyps removed both times  . IR FL GUIDED LOC OF NEEDLE/CATH TIP FOR SPINAL INJECTION RT  06/04/2017  . JOINT REPLACEMENT    . REPLACEMENT TOTAL KNEE Right   . TUBAL LIGATION     Family History  Problem Relation Age of Onset  . Heart failure Mother   . COPD Mother   . Heart failure Father   . COPD Father   . Cancer Other   . Heart failure Other   . Breast cancer Maternal Aunt   . Breast cancer Paternal Aunt   . Breast cancer Cousin   . Pancreatic cancer Paternal Uncle   . Colon cancer Maternal Grandfather   . Esophageal cancer Neg Hx   . Rectal cancer Neg Hx   . Stomach cancer Neg Hx    Social History   Tobacco Use  . Smoking status: Never Smoker  . Smokeless tobacco: Never Used  Substance Use Topics  . Alcohol use: No  . Drug use: No   Current Outpatient Medications  Medication Sig Dispense Refill  . acetaminophen-codeine (TYLENOL #3) 300-30 MG tablet Take 1 tablet by mouth every 6 (six) hours as needed for moderate pain. 120 tablet 1  . albuterol (PROVENTIL HFA;VENTOLIN HFA) 108 (90 Base) MCG/ACT inhaler Inhale 2 puffs into the lungs every 6 (six) hours as needed. For wheezing 1 Inhaler 2  . albuterol (PROVENTIL) (2.5 MG/3ML) 0.083% nebulizer solution Take 3 mLs (2.5 mg total) by nebulization every 6 (six) hours as needed for wheezing or shortness of breath. 150 mL 1  . aspirin EC 81 MG tablet Take 81 mg by mouth daily at 12 noon.     . diclofenac sodium (VOLTAREN) 1 % GEL Apply 2 g topically 4 (four) times daily as needed (For pain in shoulder, knees, back and neck.).     Marland Kitchen ferrous sulfate 325 (65 FE) MG tablet Take 1 tablet (325 mg total) by mouth 2 (two) times daily with a meal. 60 tablet 1  . fluticasone (FLONASE) 50 MCG/ACT nasal spray Place 1 spray into both nostrils as needed for allergies or rhinitis.    . furosemide  (LASIX) 40 MG tablet TAKE 1 TABLET (40 MG TOTAL) BY MOUTH 2 (TWO) TIMES DAILY AS NEEDED FOR FLUID. Future refills will needs to come from Cardiology 90 tablet 0  . Hypromellose (ARTIFICIAL TEARS OP) Place 1 drop into both eyes daily as needed (dry eyes).    Marland Kitchen lidocaine (LIDODERM) 5 % Place 1-3 patches onto the skin as needed.     Marland Kitchen  losartan (COZAAR) 25 MG tablet Take 1 tablet (25 mg total) by mouth daily. 30 tablet 0  . methocarbamol (ROBAXIN) 500 MG tablet Take 1 tablet (500 mg total) by mouth every 8 (eight) hours as needed for muscle spasms. 90 tablet 1  . metoprolol succinate (TOPROL-XL) 25 MG 24 hr tablet Take 1 tablet (25 mg total) by mouth daily. 30 tablet 0  . nitroGLYCERIN (NITROSTAT) 0.4 MG SL tablet Place 1 tablet (0.4 mg total) under the tongue every 5 (five) minutes as needed for chest pain. 25 tablet 0  . nystatin (NYAMYC) powder Apply 1 g topically daily as needed (rash).    Marland Kitchen omeprazole (PRILOSEC) 40 MG capsule TAKE 1 CAPSULE BY MOUTH TWICE A DAY 180 capsule 0  . ondansetron (ZOFRAN ODT) 8 MG disintegrating tablet 8mg  ODT q4 hours prn nausea 10 tablet 0  . pravastatin (PRAVACHOL) 80 MG tablet TAKE 1 TABLET BY MOUTH DAILY AT 12 NOON. 90 tablet 3  . pregabalin (LYRICA) 50 MG capsule Take 1 capsule (50 mg total) by mouth 2 (two) times daily. 60 capsule 3  . PRESCRIPTION MEDICATION CPAP    . SUMAtriptan (IMITREX) 5 MG/ACT nasal spray USE 1 SPRAY INTO NOSTRIL EVERY 2 HOURS AS NEEDED FOR MIGRAINE, MAX DAILY DOSE 2 SPRAYS 1 Inhaler 2  . Vitamin D, Ergocalciferol, (DRISDOL) 50000 units CAPS capsule Take 50,000 Units by mouth every 7 (seven) days.    Marland Kitchen warfarin (COUMADIN) 5 MG tablet TAKE 1 TABLET BY MOUTH ON MONDAY,WEDNESDAY,FRIDAY AND A 1/2 TABLET THE REST OF THE WEEK 25 tablet 1   No current facility-administered medications for this visit.    Allergies  Allergen Reactions  . Dexamethasone Hives and Other (See Comments)     Made patient feel crazy   . Latex Other (See Comments)     Stings and burns me  . Other Nausea And Vomiting and Other (See Comments)    Bleach - burns esophagus, breaks me out and burns me  . Shellfish Allergy Hives and Nausea And Vomiting  . Tape Other (See Comments)    Plastic tape irritates skin     Review of Systems: All systems reviewed and negative except where noted in HPI.    No results found.  Physical Exam: NA   ASSESSMENT AND PLAN: 63 y/o female here for reassessment of the following issues:  Iron deficiency anemia - chronic microcytic anemia with iron deficiency noted on labs after last clinic visit. She had previously undergone a colonoscopy with Dr. Benson Norway in 2017 for rectal bleeding which did not show anything concerning. I performed an EGD in December 2019 which showed some benign gastric fundic gland polyps and a small HH, nothing obvious to cause anemia, H pylori negative. She has been on oral iron supplementation since then, will plan for repeat CBC with iron studies now and assess her response. She may need a another colonoscopy versus capsule endoscopy to clear her small bowel pending her course. Will await labs first, she agreed.   Fatty liver / morbid obesity - ALT normal but fatty liver noted on MRI and prior CT scans. No evidence of cirrhosis based on imaging thus far. She does not use alcohol, suspect fatty liver due to morbid obesity. She has gained weight since I have seen her, having a difficult time managing her weight. She has been seen by [facility] weight loss clinic in the past from what she thinks, did not help her much. I offered her a referral to another weight loss  clinic for another opinion, Dr. Johnette Abraham of [facility], and she wished to be seen there for another opinion. Otherwise we discussed fatty liver in general, risks for cirrhosis moving forward which she understands. LFTs up to date and will trend these yearly. She is due for Twinrix vaccine to hep A / B, will coordinate this for  her, she agreed.   Ririe Cellar, MD [facility]

## 2019-03-12 NOTE — Patient Instructions (Signed)
Your provider has requested that you go to the basement level for lab work . Press "B" on the elevator. The lab is located at the first door on the left as you exit the elevator.  You have an appointment for your first hepatitis AB injection on 03/25/19 at [place]  Thank you for entrusting me with your care and choosing [facility].  Dr Havery Moros

## 2019-03-25 ENCOUNTER — Ambulatory Visit (INDEPENDENT_AMBULATORY_CARE_PROVIDER_SITE_OTHER): Payer: Medicare HMO | Admitting: Gastroenterology

## 2019-03-25 ENCOUNTER — Other Ambulatory Visit: Payer: Self-pay

## 2019-03-25 ENCOUNTER — Other Ambulatory Visit (INDEPENDENT_AMBULATORY_CARE_PROVIDER_SITE_OTHER): Payer: Medicare HMO

## 2019-03-25 DIAGNOSIS — Z23 Encounter for immunization: Secondary | ICD-10-CM

## 2019-03-25 DIAGNOSIS — D509 Iron deficiency anemia, unspecified: Secondary | ICD-10-CM

## 2019-03-25 LAB — CBC WITH DIFFERENTIAL/PLATELET
Basophils Absolute: 0.1 10*3/uL (ref 0.0–0.1)
Basophils Relative: 1.3 % (ref 0.0–3.0)
Eosinophils Absolute: 0.2 10*3/uL (ref 0.0–0.7)
Eosinophils Relative: 2.2 % (ref 0.0–5.0)
HCT: 46.7 % — ABNORMAL HIGH (ref 36.0–46.0)
Hemoglobin: 15.6 g/dL — ABNORMAL HIGH (ref 12.0–15.0)
Lymphocytes Relative: 27.7 % (ref 12.0–46.0)
Lymphs Abs: 2.3 10*3/uL (ref 0.7–4.0)
MCHC: 33.5 g/dL (ref 30.0–36.0)
MCV: 86 fl (ref 78.0–100.0)
Monocytes Absolute: 0.6 10*3/uL (ref 0.1–1.0)
Monocytes Relative: 6.8 % (ref 3.0–12.0)
Neutro Abs: 5.1 10*3/uL (ref 1.4–7.7)
Neutrophils Relative %: 62 % (ref 43.0–77.0)
Platelets: 249 10*3/uL (ref 150.0–400.0)
RBC: 5.43 Mil/uL — ABNORMAL HIGH (ref 3.87–5.11)
RDW: 16.9 % — ABNORMAL HIGH (ref 11.5–15.5)
WBC: 8.3 10*3/uL (ref 4.0–10.5)

## 2019-03-25 LAB — IBC + FERRITIN
Ferritin: 57.8 ng/mL (ref 10.0–291.0)
Iron: 63 ug/dL (ref 42–145)
Saturation Ratios: 19.8 % — ABNORMAL LOW (ref 20.0–50.0)
Transferrin: 227 mg/dL (ref 212.0–360.0)

## 2019-03-25 NOTE — Progress Notes (Signed)
Pt rec'd 1st of 3 Twinrix injections.  Tolerated very well.  Next appt in 30 days on April 24, 2019. Pt informed and given VIS

## 2019-03-26 ENCOUNTER — Other Ambulatory Visit: Payer: Self-pay | Admitting: Registered Nurse

## 2019-04-03 ENCOUNTER — Other Ambulatory Visit: Payer: Self-pay | Admitting: Adult Health

## 2019-04-03 DIAGNOSIS — Z7901 Long term (current) use of anticoagulants: Secondary | ICD-10-CM | POA: Diagnosis not present

## 2019-04-03 DIAGNOSIS — I2699 Other pulmonary embolism without acute cor pulmonale: Secondary | ICD-10-CM | POA: Diagnosis not present

## 2019-04-03 LAB — POCT INR: INR: 2.6 — AB (ref 0.9–1.1)

## 2019-04-04 ENCOUNTER — Telehealth: Payer: Self-pay

## 2019-04-04 NOTE — Telephone Encounter (Signed)
Pt informed to continue same dose of Coumadin and recheck INR in 1 month.  Pt expressed understanding and is agreeable.  Charyl Bigger, CMA

## 2019-04-04 NOTE — Telephone Encounter (Signed)
LVM for pt to call to discuss results.  T. Nelson, CMA  

## 2019-04-04 NOTE — Telephone Encounter (Signed)
Pt's INR from 04/03/2019 was 2.6.  Please advise Coumadin directions for the next month.  Charyl Bigger, CMA

## 2019-04-04 NOTE — Telephone Encounter (Signed)
INR therapeutic No change to current coumadin regime Thanks! Valetta Fuller

## 2019-04-07 ENCOUNTER — Other Ambulatory Visit: Payer: Self-pay | Admitting: Adult Health

## 2019-04-13 ENCOUNTER — Other Ambulatory Visit: Payer: Self-pay | Admitting: Adult Health

## 2019-04-24 ENCOUNTER — Ambulatory Visit (INDEPENDENT_AMBULATORY_CARE_PROVIDER_SITE_OTHER): Payer: Medicare HMO | Admitting: Gastroenterology

## 2019-04-24 ENCOUNTER — Telehealth: Payer: Self-pay

## 2019-04-24 DIAGNOSIS — Z23 Encounter for immunization: Secondary | ICD-10-CM | POA: Diagnosis not present

## 2019-04-24 MED ORDER — METRONIDAZOLE 500 MG PO TABS: 500.0000 mg | ORAL_TABLET | Freq: Three times a day (TID) | ORAL | 0 refills | Status: AC

## 2019-04-24 MED ORDER — CIPROFLOXACIN HCL 500 MG PO TABS: 500.0000 mg | ORAL_TABLET | Freq: Two times a day (BID) | ORAL | 0 refills | Status: AC

## 2019-04-24 NOTE — Telephone Encounter (Signed)
Good Morning Dr. Havery Moros,   [name] came in this morning for her HEP #2 injection. She had some c/o abd pain x 3-4 days. She denies any fever, chills, or diarrhea, but thinks that she maybe having a flare up of diverticulitis. She wanted to know if abxs could be sent into her pharmacy, or did she need to make an appointment. Please advise.

## 2019-04-24 NOTE — Telephone Encounter (Signed)
Thanks for letting me know. If she thinks she is having a diverticulitis flare we can give flagyl 500mg  TID and cipro 500mg  BID, each for one week. Thanks

## 2019-04-24 NOTE — Telephone Encounter (Signed)
Called and informed pt that flagy and cipro has been sent into her pharmacy. Pt instructed on how to take abxs. Pt states that she understands.

## 2019-05-01 ENCOUNTER — Telehealth: Payer: Self-pay

## 2019-05-01 LAB — POCT INR: INR: 2.6 — AB (ref 0.9–1.1)

## 2019-05-01 NOTE — Progress Notes (Signed)
Pt's INR for 05/01/2019 is 2.6.  Charyl Bigger, CMA

## 2019-05-01 NOTE — Telephone Encounter (Signed)
Per Mina [name], NP no changes to current Coumadin dosage.  Pt informed and expressed understanding.  Charyl Bigger, CMA

## 2019-05-30 LAB — POCT INR: INR: 2.9 — AB (ref 0.9–1.1)

## 2019-05-31 ENCOUNTER — Other Ambulatory Visit: Payer: Self-pay | Admitting: Adult Health

## 2019-06-03 ENCOUNTER — Telehealth: Payer: Self-pay

## 2019-06-03 NOTE — Telephone Encounter (Signed)
Noted No change to current coumadin therapt Thanks! Valetta Fuller

## 2019-06-03 NOTE — Telephone Encounter (Signed)
LVM for pt with Coumadin instructions.  Charyl Bigger, CMA

## 2019-06-03 NOTE — Telephone Encounter (Signed)
Received fax from [facility] that pt's INR on 05/30/2019 was 2.9.  Charyl Bigger, CMA

## 2019-06-26 ENCOUNTER — Telehealth: Payer: Self-pay

## 2019-06-26 NOTE — Telephone Encounter (Signed)
Received fax from [facility] that pt has not done an INR since 05/30/2019.  LVM for pt to return call to discuss.  Charyl Bigger, CMA

## 2019-06-30 ENCOUNTER — Other Ambulatory Visit: Payer: Self-pay | Admitting: Adult Health

## 2019-07-02 LAB — POCT INR: INR: 3 — AB (ref 0.9–1.1)

## 2019-07-04 ENCOUNTER — Telehealth: Payer: Self-pay

## 2019-07-04 NOTE — Telephone Encounter (Signed)
LVM informing pt no change to Coumadin needed.  Charyl Bigger, CMA

## 2019-07-16 ENCOUNTER — Other Ambulatory Visit: Payer: Self-pay | Admitting: Adult Health

## 2019-08-30 ENCOUNTER — Other Ambulatory Visit: Payer: Self-pay | Admitting: Adult Health

## 2019-09-02 ENCOUNTER — Telehealth: Payer: Self-pay

## 2019-09-02 NOTE — Telephone Encounter (Signed)
Please call pt to schedule appt.  No further refills until pt is seen.  T. Nelson, CMA  

## 2019-09-12 ENCOUNTER — Telehealth: Payer: Self-pay

## 2019-09-12 NOTE — Telephone Encounter (Signed)
Called pt and LM that I scheduled her for her 3rd and final Twinrix on December 1. Asked pt to call back if date and time is not convenient and we can reschedule for another day later that week.

## 2019-09-12 NOTE — Telephone Encounter (Signed)
-----   Message from Roetta Sessions, [place] sent at 04/24/2019  2:46 PM EDT ----- Regarding: FW: HEP #3  ----- Message ----- From: Debbe Mounts, CMA Sent: 04/24/2019   9:47 AM EDT To: Roetta Sessions, CMA Subject: HEP #3                                         Pt will need Hep # 3 in Sep 24, 2019.

## 2019-09-16 NOTE — Telephone Encounter (Signed)
Sent letter to pt notifying of nurse visit on Dec. 1 for final Twinrix

## 2019-09-26 ENCOUNTER — Telehealth: Payer: Self-pay | Admitting: General Practice

## 2019-09-30 ENCOUNTER — Other Ambulatory Visit: Payer: Self-pay | Admitting: Adult Health

## 2019-10-01 NOTE — Telephone Encounter (Signed)
Received refill request for pt's Coumadin from [facility] in [place].  Mina [name] has been removed from pt's chart as PCP.  Also, we have not received any INR results for the pt in several months.  LVM requesting pt return call to determine if she has moved and has a new PCP.  Otherwise, she needs INR completed for refills.  Charyl Bigger, CMA

## 2019-10-02 NOTE — Telephone Encounter (Signed)
LVM for pt to call to discuss.  T. Banks Chaikin, CMA  

## 2019-10-03 NOTE — Telephone Encounter (Signed)
After further review of chart, a physician in [place], [place] is now listed as pt's PCP.  Refill request denied.  Charyl Bigger, CMA

## 2020-06-06 ENCOUNTER — Other Ambulatory Visit: Payer: Self-pay | Admitting: Adult Health

## 2020-09-30 IMAGING — MR MR ABDOMEN WO/W CM
17 series · 48 of 48 positions shown · IV contrast (20 ml multihance)
Comparison: CTs of 07/08/2018 and 02/09/2018. Comparison back to
04/06/2016

CLINICAL DATA: Abnormal liver on prior CT.  Prior diverticulitis.

EXAM:
MRI ABDOMEN WITHOUT AND WITH CONTRAST
TECHNIQUE: Multiplanar multisequence MR imaging of the abdomen was performed
both before and after the administration of intravenous contrast.
CONTRAST:  20mL MULTIHANCE GADOBENATE DIMEGLUMINE 529 MG/ML IV SOLN

[Series 4: DWI · axial · 5.0mm · 1.57mm/px · z∈[-105,+140]mm · 4 of 108 slices shown (1 of 2)]
[im 1/108]
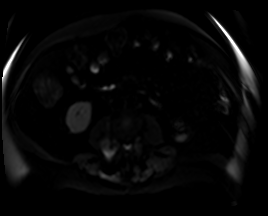
[im 36/108]
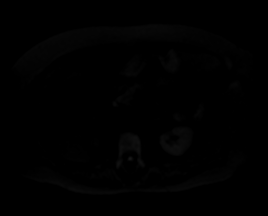
[im 72/108]
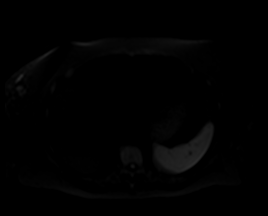
[im 108/108]
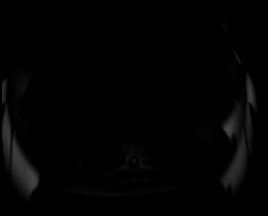

[Series 5: DWI · axial · 5.0mm · 1.57mm/px · 1 of 36 slices shown (2 of 2)]
[im 1/36]
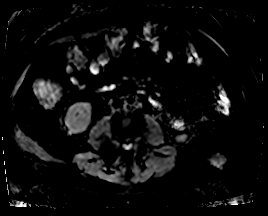

[Series 6: T2 · axial · 5.0mm · 1.64mm/px · z∈[-94,+128]mm · 2 of 38 slices shown (1 of 3)]
[im 1/38]
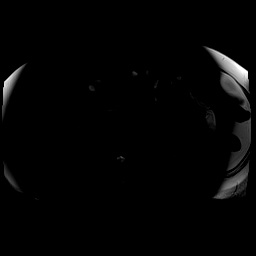
[im 38/38]
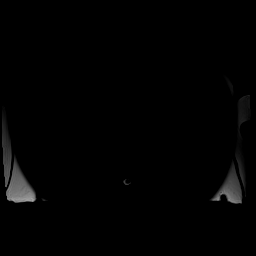

[Series 8: T2 · coronal · 5.0mm · 1.64mm/px · 2 of 36 slices shown (2 of 3)]
[im 1/36]
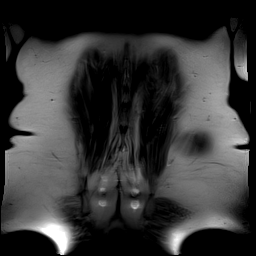
[im 36/36]
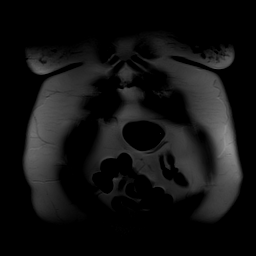

[Series 9: T1 · axial · 3.7mm · 1.31mm/px · z∈[-153,+110]mm · 6 of 144 slices shown]
[im 1/144]
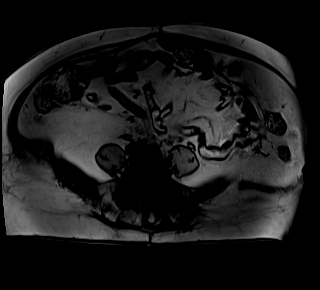
[im 29/144]
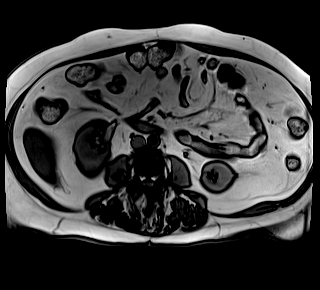
[im 58/144]
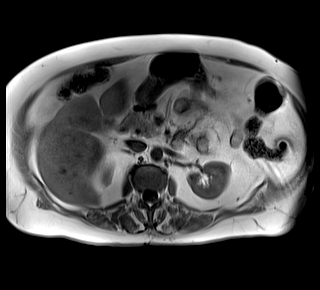
[im 86/144]
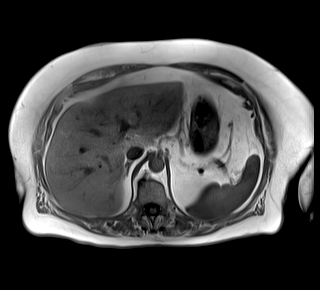
[im 115/144]
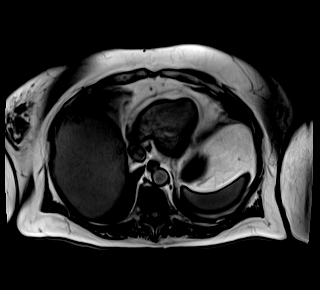
[im 144/144]
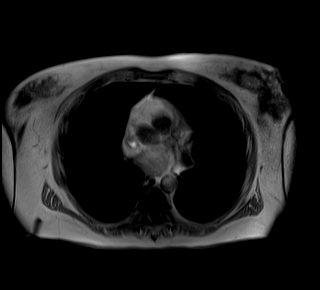

[Series 10: T2 · axial · 6.5mm · 1.31mm/px · 1 of 30 slices shown (3 of 3)]
[im 1/30]
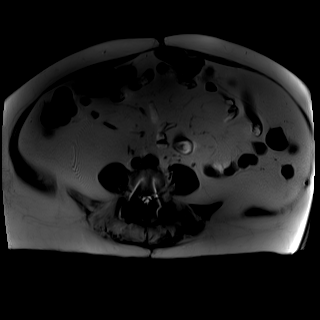

[Series 11: bSSFP · axial · 5.5mm · 1.31mm/px · z∈[-139,+105]mm · 2 of 38 slices shown]
[im 1/38]
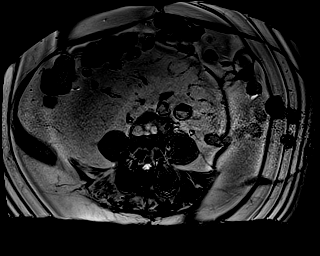
[im 38/38]
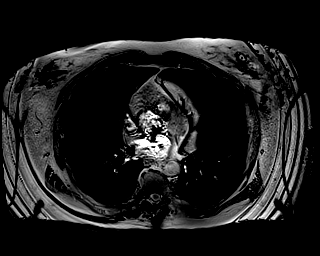

[Series 12: T1 dynamic · axial · non-contrast · 3.7mm · 1.31mm/px · z∈[-148,+114]mm · 3 of 72 slices shown]
[im 1/72]
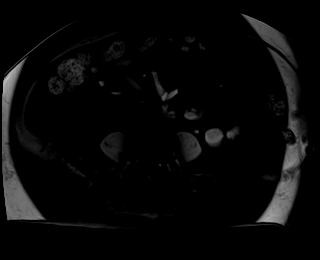
[im 36/72]
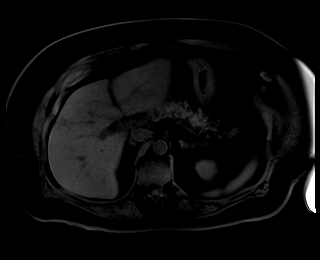
[im 72/72]
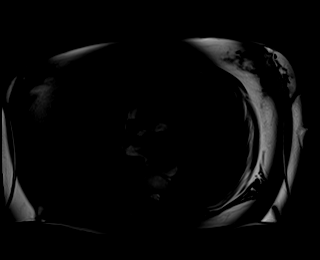

[Series 13: T1 dynamic post-contrast · axial · 3.7mm · 1.31mm/px · z∈[-148,+114]mm · 3 of 72 slices shown (1 of 9)]
[im 1/72]
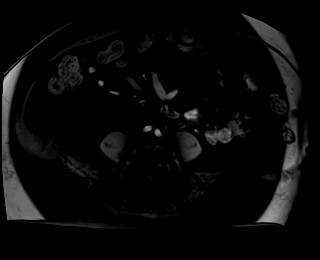
[im 36/72]
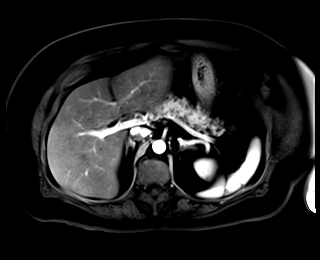
[im 72/72]
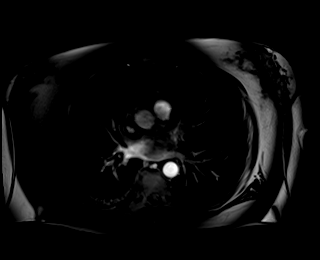

[Series 14: T1 dynamic post-contrast · axial · 3.7mm · 1.31mm/px · z∈[-148,+114]mm · 3 of 72 slices shown (2 of 9)]
[im 1/72]
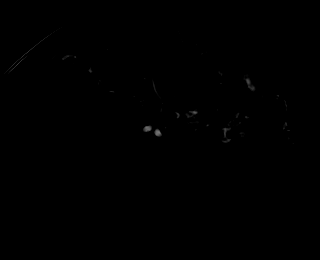
[im 36/72]
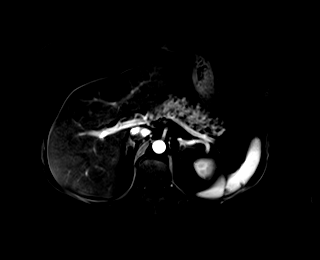
[im 72/72]
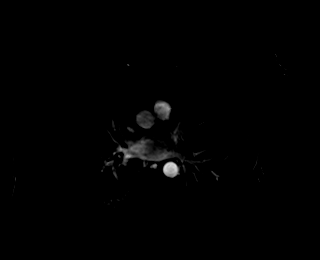

[Series 15: T1 dynamic post-contrast · axial · 3.7mm · 1.31mm/px · z∈[-148,+114]mm · 3 of 72 slices shown (3 of 9)]
[im 1/72]
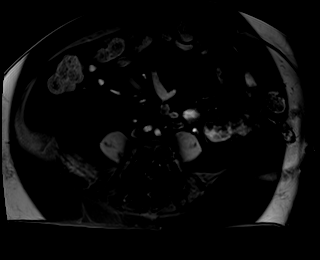
[im 36/72]
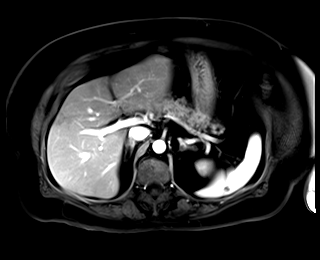
[im 72/72]
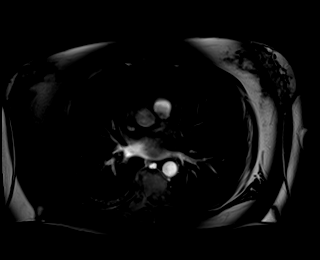

[Series 16: T1 dynamic post-contrast · axial · 3.7mm · 1.31mm/px · z∈[-148,+114]mm · 3 of 72 slices shown (4 of 9)]
[im 1/72]
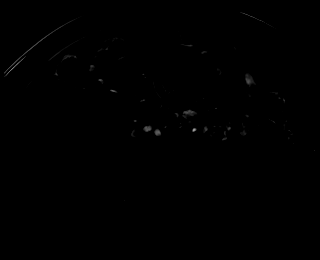
[im 36/72]
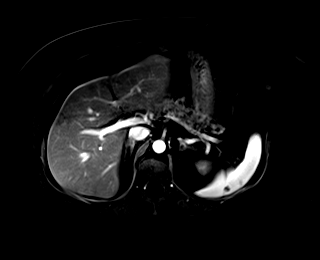
[im 72/72]
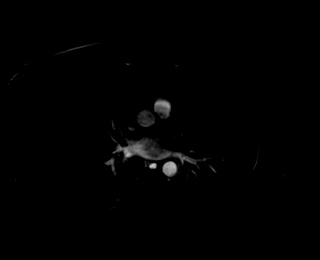

[Series 17: T1 dynamic post-contrast · axial · 3.7mm · 1.31mm/px · z∈[-148,+114]mm · 3 of 72 slices shown (5 of 9)]
[im 1/72]
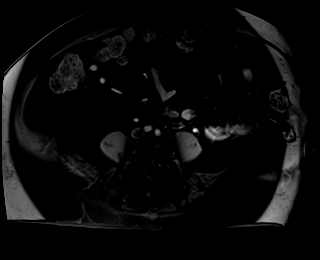
[im 36/72]
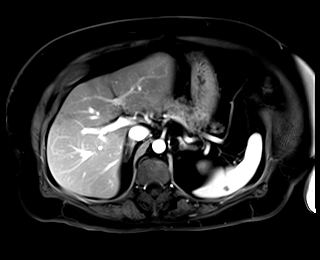
[im 72/72]
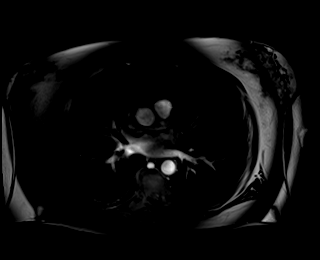

[Series 18: T1 dynamic post-contrast · axial · 3.7mm · 1.31mm/px · z∈[-148,+114]mm · 3 of 72 slices shown (6 of 9)]
[im 1/72]
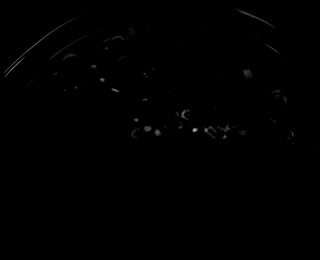
[im 36/72]
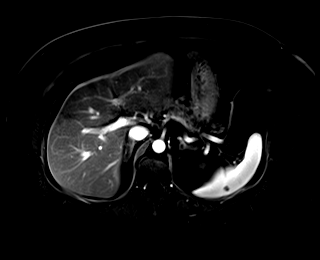
[im 72/72]
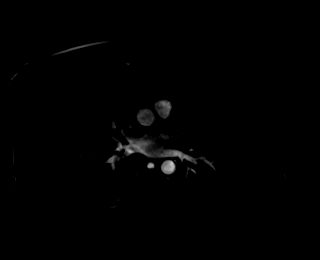

[Series 19: T1 dynamic post-contrast · coronal · 3.0mm · 1.31mm/px · 3 of 72 slices shown (7 of 9)]
[im 1/72]
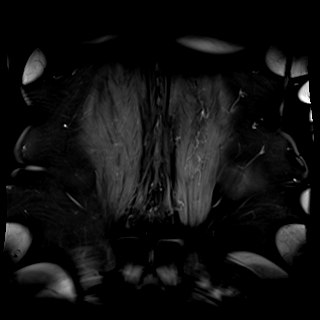
[im 36/72]
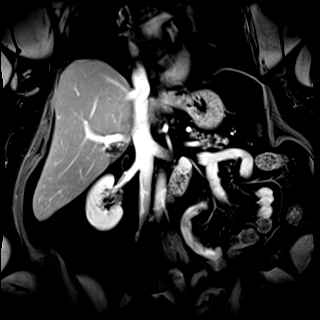
[im 72/72]
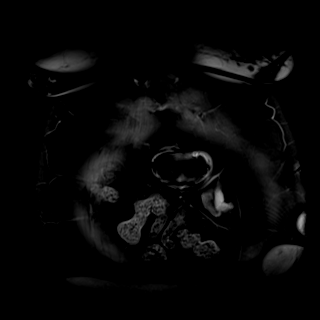

[Series 20: T1 dynamic post-contrast · axial · 3.7mm · 1.31mm/px · z∈[-148,+114]mm · 3 of 72 slices shown (8 of 9)]
[im 1/72]
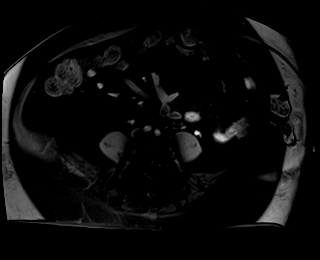
[im 36/72]
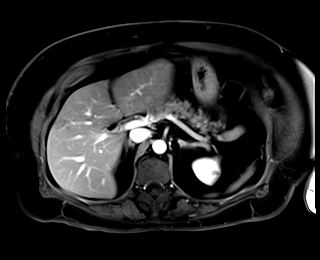
[im 72/72]
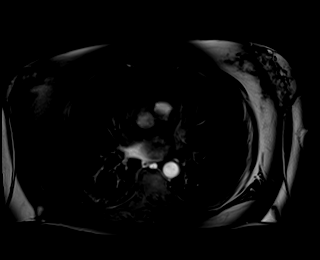

[Series 21: T1 dynamic post-contrast · axial · 3.7mm · 1.31mm/px · z∈[-148,+114]mm · 3 of 72 slices shown (9 of 9)]
[im 1/72]
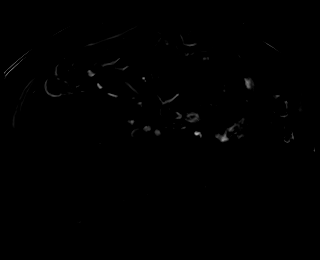
[im 36/72]
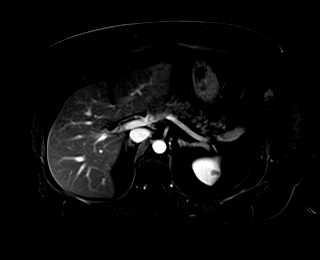
[im 72/72]
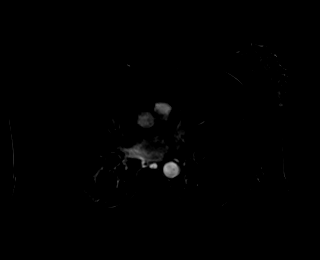

[48 of 48 positions shown; findings below may reference images not displayed]

FINDINGS: Lower chest: Normal heart size without pericardial or pleural
effusion.

Hepatobiliary: Hepatomegaly, 20.5 cm craniocaudal. There is mild to
moderate diffuse hepatic steatosis. Corresponding to the CT
abnormality is geographic more marked steatosis within the left lobe
and minimally in the anterior segment right hepatic lobe. No
suspicious liver lesion. Cholecystectomy, without biliary ductal
dilatation.

Pancreas:  Normal, without mass or ductal dilatation.

Spleen: Subcapsular cystic lesions within the spleen were present
back in 0274 and are of no clinical significance. Maximally 12 mm.

Adrenals/Urinary Tract: Minimal left adrenal nodularity. Normal
right adrenal gland and right kidney. Tiny left renal lesions are
too small to entirely characterize but likely cysts. No
hydronephrosis.

Stomach/Bowel: Normal stomach and abdominal bowel loops.

Vascular/Lymphatic: Normal caliber of the aorta and branch vessels.
No abdominal adenopathy.

Other:  No ascites.

Musculoskeletal: Lumbosacral spine fixation.
IMPRESSION: 1. The CT abnormality corresponds to marked geographic steatosis,
superimposed upon diffuse mild-to-moderate steatosis.
2.  No acute abdominal process.
3. Hepatomegaly.

## 2021-10-07 ENCOUNTER — Encounter: Payer: Self-pay | Admitting: Gastroenterology

## 2022-01-22 DEATH — deceased
